# Patient Record
Sex: Female | Born: 1940 | Race: Black or African American | Hispanic: No | Smoking: Current every day smoker
Health system: Southern US, Community
[De-identification: ages and names within clinical notes are randomized; demographics above are authoritative.]

## PROBLEM LIST (undated history)

## (undated) DIAGNOSIS — M1990 Unspecified osteoarthritis, unspecified site: Secondary | ICD-10-CM

## (undated) DIAGNOSIS — R0600 Dyspnea, unspecified: Secondary | ICD-10-CM

## (undated) DIAGNOSIS — I10 Essential (primary) hypertension: Secondary | ICD-10-CM

## (undated) DIAGNOSIS — R112 Nausea with vomiting, unspecified: Secondary | ICD-10-CM

## (undated) DIAGNOSIS — G4730 Sleep apnea, unspecified: Secondary | ICD-10-CM

## (undated) DIAGNOSIS — J449 Chronic obstructive pulmonary disease, unspecified: Secondary | ICD-10-CM

## (undated) DIAGNOSIS — E119 Type 2 diabetes mellitus without complications: Secondary | ICD-10-CM

## (undated) DIAGNOSIS — K219 Gastro-esophageal reflux disease without esophagitis: Secondary | ICD-10-CM

## (undated) DIAGNOSIS — Z972 Presence of dental prosthetic device (complete) (partial): Secondary | ICD-10-CM

## (undated) DIAGNOSIS — M792 Neuralgia and neuritis, unspecified: Secondary | ICD-10-CM

## (undated) DIAGNOSIS — E785 Hyperlipidemia, unspecified: Secondary | ICD-10-CM

## (undated) DIAGNOSIS — Z9889 Other specified postprocedural states: Secondary | ICD-10-CM

## (undated) DIAGNOSIS — H409 Unspecified glaucoma: Secondary | ICD-10-CM

## (undated) DIAGNOSIS — R519 Headache, unspecified: Secondary | ICD-10-CM

## (undated) DIAGNOSIS — J45909 Unspecified asthma, uncomplicated: Secondary | ICD-10-CM

## (undated) DIAGNOSIS — F329 Major depressive disorder, single episode, unspecified: Secondary | ICD-10-CM

## (undated) DIAGNOSIS — F32A Depression, unspecified: Secondary | ICD-10-CM

## (undated) DIAGNOSIS — R51 Headache: Secondary | ICD-10-CM

## (undated) HISTORY — DX: Type 2 diabetes mellitus without complications: E11.9

## (undated) HISTORY — PX: ABDOMINAL HYSTERECTOMY: SHX81

## (undated) HISTORY — DX: Sleep apnea, unspecified: G47.30

## (undated) HISTORY — PX: CATARACT EXTRACTION W/ INTRAOCULAR LENS  IMPLANT, BILATERAL: SHX1307

## (undated) HISTORY — PX: HAND SURGERY: SHX662

## (undated) HISTORY — PX: TONGUE SURGERY: SHX810

## (undated) HISTORY — DX: Hyperlipidemia, unspecified: E78.5

## (undated) HISTORY — DX: Unspecified glaucoma: H40.9

## (undated) HISTORY — DX: Essential (primary) hypertension: I10

## (undated) HISTORY — DX: Major depressive disorder, single episode, unspecified: F32.9

## (undated) HISTORY — DX: Depression, unspecified: F32.A

## (undated) HISTORY — DX: Gastro-esophageal reflux disease without esophagitis: K21.9

## (undated) HISTORY — PX: VOCAL CORD LATERALIZATION, ENDOSCOPIC APPROACH W/ MLB: SHX2664

## (undated) HISTORY — PX: TONSILLECTOMY: SUR1361

## (undated) HISTORY — PX: FOOT SURGERY: SHX648

## (undated) HISTORY — PX: CHOLECYSTECTOMY: SHX55

## (undated) HISTORY — DX: Unspecified osteoarthritis, unspecified site: M19.90

---

## 1980-02-21 DIAGNOSIS — Z9889 Other specified postprocedural states: Secondary | ICD-10-CM

## 1980-02-21 HISTORY — DX: Other specified postprocedural states: Z98.890

## 2003-12-23 DIAGNOSIS — Z0000 Encounter for general adult medical examination without abnormal findings: Secondary | ICD-10-CM | POA: Insufficient documentation

## 2005-11-24 ENCOUNTER — Emergency Department: Payer: Self-pay | Admitting: Emergency Medicine

## 2006-11-13 ENCOUNTER — Emergency Department: Payer: Self-pay | Admitting: Emergency Medicine

## 2006-11-14 ENCOUNTER — Emergency Department: Payer: Self-pay | Admitting: Unknown Physician Specialty

## 2006-11-16 ENCOUNTER — Emergency Department: Payer: Self-pay | Admitting: Emergency Medicine

## 2006-11-18 ENCOUNTER — Emergency Department: Payer: Self-pay | Admitting: Emergency Medicine

## 2006-11-19 ENCOUNTER — Emergency Department: Payer: Self-pay | Admitting: Emergency Medicine

## 2007-07-26 DIAGNOSIS — E1139 Type 2 diabetes mellitus with other diabetic ophthalmic complication: Secondary | ICD-10-CM | POA: Insufficient documentation

## 2007-07-26 DIAGNOSIS — E1165 Type 2 diabetes mellitus with hyperglycemia: Secondary | ICD-10-CM | POA: Insufficient documentation

## 2007-07-26 DIAGNOSIS — E113513 Type 2 diabetes mellitus with proliferative diabetic retinopathy with macular edema, bilateral: Secondary | ICD-10-CM | POA: Insufficient documentation

## 2009-02-20 HISTORY — PX: EYE SURGERY: SHX253

## 2009-08-25 ENCOUNTER — Ambulatory Visit: Payer: Self-pay | Admitting: Cardiovascular Disease

## 2010-01-06 ENCOUNTER — Ambulatory Visit: Payer: Self-pay | Admitting: Rheumatology

## 2010-02-22 ENCOUNTER — Ambulatory Visit: Payer: Self-pay | Admitting: Neurosurgery

## 2010-03-22 DIAGNOSIS — H401190 Primary open-angle glaucoma, unspecified eye, stage unspecified: Secondary | ICD-10-CM | POA: Insufficient documentation

## 2010-03-22 DIAGNOSIS — Z961 Presence of intraocular lens: Secondary | ICD-10-CM | POA: Insufficient documentation

## 2010-03-22 DIAGNOSIS — E113299 Type 2 diabetes mellitus with mild nonproliferative diabetic retinopathy without macular edema, unspecified eye: Secondary | ICD-10-CM | POA: Insufficient documentation

## 2010-03-22 DIAGNOSIS — H2510 Age-related nuclear cataract, unspecified eye: Secondary | ICD-10-CM | POA: Insufficient documentation

## 2010-03-22 DIAGNOSIS — E113513 Type 2 diabetes mellitus with proliferative diabetic retinopathy with macular edema, bilateral: Secondary | ICD-10-CM | POA: Insufficient documentation

## 2010-03-22 HISTORY — DX: Presence of intraocular lens: Z96.1

## 2010-05-04 ENCOUNTER — Emergency Department: Payer: Self-pay | Admitting: Emergency Medicine

## 2010-05-23 DIAGNOSIS — H4089 Other specified glaucoma: Secondary | ICD-10-CM | POA: Insufficient documentation

## 2010-09-05 ENCOUNTER — Encounter: Payer: Self-pay | Admitting: Family Medicine

## 2010-09-21 ENCOUNTER — Encounter: Payer: Self-pay | Admitting: Family Medicine

## 2010-09-28 ENCOUNTER — Ambulatory Visit: Payer: Self-pay | Admitting: Gastroenterology

## 2010-10-03 LAB — PATHOLOGY REPORT

## 2010-10-22 ENCOUNTER — Encounter: Payer: Self-pay | Admitting: Family Medicine

## 2010-11-03 DIAGNOSIS — M545 Low back pain, unspecified: Secondary | ICD-10-CM | POA: Insufficient documentation

## 2010-11-09 DIAGNOSIS — M7550 Bursitis of unspecified shoulder: Secondary | ICD-10-CM | POA: Insufficient documentation

## 2010-11-09 DIAGNOSIS — M545 Low back pain, unspecified: Secondary | ICD-10-CM | POA: Insufficient documentation

## 2010-11-21 ENCOUNTER — Encounter: Payer: Self-pay | Admitting: Family Medicine

## 2010-12-22 ENCOUNTER — Encounter: Payer: Self-pay | Admitting: Family Medicine

## 2011-01-21 ENCOUNTER — Encounter: Payer: Self-pay | Admitting: Family Medicine

## 2011-03-31 DIAGNOSIS — H348192 Central retinal vein occlusion, unspecified eye, stable: Secondary | ICD-10-CM | POA: Insufficient documentation

## 2011-03-31 DIAGNOSIS — H04129 Dry eye syndrome of unspecified lacrimal gland: Secondary | ICD-10-CM | POA: Insufficient documentation

## 2011-04-08 ENCOUNTER — Observation Stay: Payer: Self-pay | Admitting: Internal Medicine

## 2011-04-08 LAB — COMPREHENSIVE METABOLIC PANEL
Albumin: 3.9 g/dL (ref 3.4–5.0)
Alkaline Phosphatase: 99 U/L (ref 50–136)
Anion Gap: 6 — ABNORMAL LOW (ref 7–16)
Calcium, Total: 9 mg/dL (ref 8.5–10.1)
Chloride: 106 mmol/L (ref 98–107)
Co2: 28 mmol/L (ref 21–32)
Creatinine: 1.05 mg/dL (ref 0.60–1.30)
EGFR (African American): >60
EGFR (Non-African Amer.): 55 — ABNORMAL LOW
Osmolality: 282 (ref 275–301)
Potassium: 4.5 mmol/L (ref 3.5–5.1)
SGOT(AST): 18 U/L (ref 15–37)
SGPT (ALT): 23 U/L
Sodium: 140 mmol/L (ref 136–145)
Total Protein: 7.2 g/dL (ref 6.4–8.2)

## 2011-04-08 LAB — CBC WITH DIFFERENTIAL/PLATELET
Eosinophil #: 0.2 10*3/uL (ref 0.0–0.7)
Eosinophil %: 3.8 %
MCH: 29 pg (ref 26.0–34.0)
Monocyte #: 0.2 10*3/uL (ref 0.0–0.7)
Neutrophil %: 69.1 %
Platelet: 300 10*3/uL (ref 150–440)
RBC: 4.32 10*6/uL (ref 3.80–5.20)
WBC: 6.2 10*3/uL (ref 3.6–11.0)

## 2011-04-08 LAB — TROPONIN I: Troponin-I: <0.02 ng/mL

## 2011-04-08 LAB — TSH: Thyroid Stimulating Horm: 1.2 u[IU]/mL

## 2011-04-09 LAB — CBC WITH DIFFERENTIAL/PLATELET
Basophil %: 0.5 %
Eosinophil #: 0.2 10*3/uL (ref 0.0–0.7)
Eosinophil %: 3.6 %
Lymphocyte #: 1.7 10*3/uL (ref 1.0–3.6)
MCH: 29 pg (ref 26.0–34.0)
MCHC: 33.1 g/dL (ref 32.0–36.0)
MCV: 88 fL (ref 80–100)
Monocyte #: 0.3 10*3/uL (ref 0.0–0.7)
Monocyte %: 4.9 %
Neutrophil %: 60.3 %
WBC: 5.7 10*3/uL (ref 3.6–11.0)

## 2011-04-09 LAB — LIPID PANEL
HDL Cholesterol: 27 mg/dL — ABNORMAL LOW (ref 40–60)
Ldl Cholesterol, Calc: 61 mg/dL (ref 0–100)
Triglycerides: 80 mg/dL (ref 0–200)
VLDL Cholesterol, Calc: 16 mg/dL (ref 5–40)

## 2011-04-09 LAB — BASIC METABOLIC PANEL
Anion Gap: 9 (ref 7–16)
BUN: 16 mg/dL (ref 7–18)
Calcium, Total: 8.2 mg/dL — ABNORMAL LOW (ref 8.5–10.1)
Chloride: 106 mmol/L (ref 98–107)
Co2: 26 mmol/L (ref 21–32)
EGFR (African American): >60
EGFR (Non-African Amer.): >60
Osmolality: 289 (ref 275–301)

## 2011-04-20 DIAGNOSIS — K5900 Constipation, unspecified: Secondary | ICD-10-CM | POA: Insufficient documentation

## 2011-04-20 HISTORY — DX: Constipation, unspecified: K59.00

## 2011-07-25 DIAGNOSIS — R519 Headache, unspecified: Secondary | ICD-10-CM

## 2011-07-25 HISTORY — DX: Headache, unspecified: R51.9

## 2011-09-18 ENCOUNTER — Encounter: Payer: Self-pay | Admitting: Rheumatology

## 2011-09-21 ENCOUNTER — Encounter: Payer: Self-pay | Admitting: Rheumatology

## 2012-07-08 ENCOUNTER — Ambulatory Visit: Payer: Self-pay | Admitting: Anesthesiology

## 2012-07-09 ENCOUNTER — Ambulatory Visit: Payer: Self-pay | Admitting: Ophthalmology

## 2013-03-11 ENCOUNTER — Ambulatory Visit: Payer: Self-pay

## 2013-10-30 DIAGNOSIS — R059 Cough, unspecified: Secondary | ICD-10-CM | POA: Insufficient documentation

## 2013-10-30 DIAGNOSIS — I214 Non-ST elevation (NSTEMI) myocardial infarction: Secondary | ICD-10-CM | POA: Insufficient documentation

## 2013-10-30 DIAGNOSIS — R079 Chest pain, unspecified: Secondary | ICD-10-CM

## 2013-10-30 HISTORY — DX: Cough, unspecified: R05.9

## 2013-10-30 HISTORY — DX: Chest pain, unspecified: R07.9

## 2014-04-07 ENCOUNTER — Ambulatory Visit: Payer: Self-pay | Admitting: Internal Medicine

## 2014-06-14 NOTE — H&P (Signed)
PATIENT NAME:  Wanda Gardner, Wanda Gardner MR#:  [id number] DATE OF BIRTH:  May 25, 1940  DATE OF ADMISSION:  04/08/2011  PRIMARY CARE PHYSICIAN: [facility], Dr. Jabier Mutton   CHIEF COMPLAINT: Not feeling well.   HISTORY OF PRESENT ILLNESS: This is a 74 year old female was sitting at the table talking with her friend. She developed a diffuse sweat. She felt very weak. She could hardly stand or walk. She never had this feeling before. She felt very nauseous but could not vomit. She had an empty-type feeling. When EMS arrived she started feeling a little bit better. When EMS arrived they put her into the ambulance on the monitor showed sinus bradycardia at 68 beats per minute. In route they stated that the patient became unresponsive, that he heart rate dropped to 48. They gave her a bolus and a mg of atropine. The patient does not recall passing out in the ambulance and currently feels fine at this point.   PAST MEDICAL HISTORY:  1. Diabetes. 2. Hypertension. 3. Muscle and joint aches. 4. Sleep apnea. 5. Glaucoma. 6. Irritable bowel syndrome.   PAST SURGICAL HISTORY:  7. Tonsillectomy.  8. Tongue biopsy. 9. Foot operation.  10. Hysterectomy in two parts.  11. Bilateral carpal tunnel. 12. Right eye surgery. 13. Gallbladder. 14. Probably the appendix out also.   ALLERGIES: Tylenol with Codeine.   MEDICATIONS:  1. Albuterol p.r.n.  2. Aspirin 81 mg daily.  3. Cozaar 50 mg daily.  4. Hydrochlorothiazide 25 mg daily.  5. Lantus 15 units subcutaneous injection nightly.  6. Lumigan 0.03% ophthalmic solution one drop affected eye at bedtime.  7. Lyrica 200 mg 3 times a day. 8. Metformin 1000 mg twice a day.  9. Naprosyn 500 mg twice a day. 10. Nexium 40 mg daily.  11. Norco 5/325, 1 tablet every six hours p.r.n.  12. Sertraline 50 mg daily.  13. Tramadol 50 mg every four hours p.r.n.  14. NovoLog 10 units prior to meals.   SOCIAL HISTORY: Smokes 1 pack per day for 50 years. Occasional beer.  Occasional marijuana. Used to work in the Personal [employer] in the past. Has not worked since her 74s.   FAMILY HISTORY: Father died in his 62s of cancer, unknown type. Mother died at 61 of congestive heart failure.    REVIEW OF SYSTEMS: CONSTITUTIONAL: Positive for sweating. Positive for chills. Positive for weakness. Positive for gaining weight. EYES: Does wear glasses. Has glaucoma. EARS, NOSE, MOUTH, AND THROAT: Decreased hearing in the right ear. Dysphagia occasional to her own saliva. No problem swallowing food or liquids or pills. CARDIOVASCULAR: Occasional chest pains, last a few weeks ago, seconds at a time, can happen at any time, happened at rest. States that she had a stress test that was negative a couple of years ago. RESPIRATORY: Positive for shortness of breath occasionally. No sputum. No hemoptysis. GASTROINTESTINAL: Positive for nausea. No vomiting. Positive for abdominal pain all the time. Occasional diarrhea and constipation. No bright red blood per rectum. No melena. GENITOURINARY: No burning on urination. No hematuria. MUSCULOSKELETAL: Positive for joint pains and muscle pain. INTEGUMENT: No rashes or eruptions. NEUROLOGIC: Near syncope today. PSYCHIATRIC: On medication for depression. ENDOCRINE: No thyroid problems. HEMATOLOGIC/LYMPHATIC: No anemia, no easy bruising or bleeding.    PHYSICAL EXAMINATION:  VITAL SIGNS: Temperature 96, pulse 70, respirations 20, blood pressure 140/75, pulse oximetry 98%.   GENERAL: No respiratory distress. Obese female lying in bed.   EYES: Conjunctivae and lids normal. Pupils equal, round, and  reactive to light. Extraocular muscles intact. No nystagmus.   EARS, NOSE, MOUTH, AND THROAT: Tympanic membranes no erythema. Nasal mucosa no erythema. Throat no erythema. No exudate seen.   NECK: No JVD. No bruits. No lymphadenopathy. No thyromegaly. No thyroid nodules palpated.   RESPIRATORY: Lungs clear to auscultation. No use of accessory  muscles to breathe. No rhonchi, rales, or wheeze heard.   CARDIOVASCULAR: S1, S2 normal. No gallops, rubs, or murmurs heard. Carotid upstroke 2+ bilaterally. No bruits.   EXTREMITIES: Dorsalis pedis pulses 2+ bilaterally. Trace edema lower extremity.   ABDOMEN: Soft, slight tenderness, more in the left upper quadrant. No organomegaly/splenomegaly. Normoactive bowel sounds. No masses felt.   LYMPHATIC: No lymph nodes in the neck.   MUSCULOSKELETAL: No clubbing. Trace edema. No cyanosis.   SKIN: No ulcers or lesions seen.   NEUROLOGIC: Cranial nerves II through XII grossly intact. Deep tendon reflexes 1+ bilateral lower extremity. Power 5/5 upper and lower extremities.   PSYCHIATRIC: Patient is oriented to person, place, and time.   LABORATORY, DIAGNOSTIC, AND RADIOLOGICAL DATA: Chest x-ray showed no acute cardiopulmonary disease. White blood cell count 6.2, hemoglobin and hematocrit 12.5 and 37.6, platelet count 300, glucose 109, BUN 18, creatinine 1.05, sodium 140, potassium 4.5, chloride 106, CO2 28, calcium 9.0. Liver function tests normal. Troponin negative. TSH 1.2. EKG normal sinus rhythm, 69 beats per minute, left atrial enlargement, low voltage.   ASSESSMENT AND PLAN:  1. Syncope with bradycardia. Unclear if this bradycardia is the cause of this near syncopal episode, sounds more like a vasovagal episode to me. Patient is not on any rate controlling medications. Will watch on telemetry at this point. Will give IV fluid hydration. Check orthostatic vital signs. Check an echo and a carotid ultrasound. Watch on telemetry. Check serial cardiac enzymes to rule out myocardial infarction. Hopefully patient will be able to be discharged tomorrow. Will admit to the observation unit.  2. Diabetes. Continue Lantus and metformin.  3. Hypertension. Blood pressure slightly elevated right now. Will continue Cozaar and hydrochlorothiazide.  4. Gastroesophageal reflux disease. Continue Nexium.   5. Sleep apnea. Will give CPAP at night.  6. Obesity. Weight loss recommended.  7. Tobacco abuse. Smoking cessation counseling done by me, three minutes. Patient refused nicotine patch.  8. Chronic abdominal pain. Patient was told that she has irritable bowel syndrome. Will hold off on imaging at this time. Follow up as outpatient.  9. Chest pain lasting seconds at a time. Patient had a negative stress test a couple of years ago. Will hold off on repeating at this point since it seems noncardiac to me. Will check cardiac enzymes.    TIME SPENT ON ADMISSION: 55 minutes. Patient will be admitted as an observation.   ____________________________ Tana Conch. Leslye Peer, MD rjw:cms D: 04/08/2011 21:23:15 ET T: 04/09/2011 08:05:46 ET JOB#: [id number]  cc: Tana Conch. Leslye Peer, MD, <Dictator> [facility], Dr. Judene Companion MD ELECTRONICALLY SIGNED 04/09/2011 20:57

## 2014-06-14 NOTE — Discharge Summary (Signed)
PATIENT NAME:  Wanda Gardner, Wanda Gardner MR#:  [id number] DATE OF BIRTH:  07/19/1940  DATE OF ADMISSION:  04/08/2011 DATE OF DISCHARGE:  04/09/2011  ADMITTING PHYSICIAN: Loletha Grayer, MD    DISCHARGING PHYSICIAN:  Gladstone Lighter, MD   PRIMARY CARE PHYSICIAN: [place] IN THE HOSPITAL:  None.   DISCHARGE DIAGNOSES:  1. Near syncopal episode, likely vasovagal.  2. Insulin-dependent diabetes mellitus.  3. Hypertension.  4. Chronic sinus bradycardia. 5. Sleep apnea. 6. Gastroesophageal reflux disease.  7. Irritable bowel syndrome.  8. Degenerative disk disease.  9. Osteoarthritis.   DISCHARGE MEDICATIONS:  1. Lantus 15 units subcutaneous at bedtime.  2. Nexium 40 mg p.o. daily.  3. Zoloft 50 mg p.o. daily.  4. Naproxen 500 mg p.o. b.i.d.  5. Tramadol 50 mg p.o. every 6 hours p.r.n.  6. HCTZ 25 mg p.o. daily.  7. Aspirin 81 mg p.o. daily.  8. Albuterol inhaler as needed.  9. Norco 5/325 mg, 1 tablet every 6 hours p.r.n.  10. Metformin 1000 mg p.o. b.i.d.  11. Lyrica 200 mg p.o. t.i.d.  12. Cozaar 50 mg p.o. daily.  13. Lumigan eyedrops 0.03% ophthalmic solution, 1 drop each eye at nighttime.   DISCHARGE DIET: Low sodium ADA diet.   DISCHARGE ACTIVITY: As tolerated.    FOLLOWUP INSTRUCTIONS:  1. Primary care physician followup in 1 to 2 weeks.  2. The patient was advised to continue all her home medications as no changes have been made in the hospital.   [place], DIAGNOSTIC AND RADIOLOGICAL DATA:  Labs at the time of discharge: WBC 5.7, hemoglobin 11.3, hematocrit 33.8, platelet count 267. Sodium 141, potassium 4.1, chloride 106, bicarbonate 26, BUN 16, creatinine 0.96, glucose of 210, calcium of 8.2.  LDL 61, HDL 27, total cholesterol 104, triglycerides of 80. Troponins have remained negative.  Chest x-ray on admission showing no acute cardiopulmonary disease.  Ultrasound of carotids bilaterally showing minimal atherosclerotic disease  without evidence of significant stenosis. TSH was 1.2.  Echo Doppler showing normal LV size and function with mild-to-moderate mitral regurgitation. Ejection fraction is greater than 55%.   BRIEF HOSPITAL COURSE: Wanda Gardner is a 74 year old female with past medical history of diabetes, hypertension, sleep apnea, and also she had a prior history of chronic dizziness and was in vestibular rehab in the past, presents to the hospital complaining of an episode where she was sitting with her friend and developed a sudden onset of dizziness with diffuse sweating. En route to the hospital, she was found to have a heart rate of 48 but asymptomatic at the time.   1. Near syncopal episode with diaphoresis, lightheadedness and dizziness: Could be vasovagal versus one of her chronic dizziness episodes coming back. She has remained sinus brady in the hospital with baseline heart rate mostly in the 50s that dropped down to 40s when she was sleeping, but she has been asymptomatic all the while; so we doubt that bradycardia was contributing to her symptoms. Probably she is chronically bradycardic, and that is why she is not on any beta blocker in her home medication list. This could have been just like a vasovagal episode. She did work well with Physical Therapy, who said that she will not need any home PT also. She has not been symptomatic while in the hospital. Her tests, including echo Dopplers, carotid Dopplers, have been negative; so she is being discharged home.  2. Hypertension: She is on  HCTZ and Cozaar, which she will continue at  home.  3. Insulin-dependent diabetes mellitus: She is on metformin and also Lantus. She takes Lyrica for her peripheral neuropathy symptoms.  4. Degenerative disk disease and osteoarthritis: She is on naproxen, Norco and tramadol p.r.n. for pain. She follows up with Pain Management. She follows up with Dr. Dossie Der, Rheumatology, for her arthritis. 5. Irritable bowel syndrome: She follows  up with Dr. Dionne Milo for the same.   Her course has been otherwise uneventful in the hospital.   DISCHARGE CONDITION: Stable.   DISCHARGE DISPOSITION: Home.   TIME SPENT ON DISCHARGE: 40 minutes.  ____________________________ Gladstone Lighter, MD rk:cbb D: 04/10/2011 14:34:33 ET T: 04/11/2011 10:06:32 ET JOB#: [id number]  cc: Gladstone Lighter, MD, <Dictator> [place]. Loma Newton, MD Jill Side, MD Dawayne Patricia. Dossie Der, MD Gladstone Lighter MD ELECTRONICALLY SIGNED 04/12/2011 11:55

## 2014-07-15 DIAGNOSIS — I10 Essential (primary) hypertension: Secondary | ICD-10-CM | POA: Diagnosis not present

## 2014-07-15 DIAGNOSIS — R0789 Other chest pain: Secondary | ICD-10-CM | POA: Insufficient documentation

## 2014-07-15 DIAGNOSIS — E119 Type 2 diabetes mellitus without complications: Secondary | ICD-10-CM | POA: Insufficient documentation

## 2014-07-15 DIAGNOSIS — J40 Bronchitis, not specified as acute or chronic: Secondary | ICD-10-CM | POA: Insufficient documentation

## 2014-07-16 ENCOUNTER — Emergency Department: Payer: Medicare Other

## 2014-07-16 ENCOUNTER — Emergency Department
Admission: EM | Admit: 2014-07-16 | Discharge: 2014-07-16 | Disposition: A | Discharge status: Home / Self Care | Payer: Medicare Other | Attending: Emergency Medicine | Admitting: Emergency Medicine

## 2014-07-16 ENCOUNTER — Other Ambulatory Visit: Payer: Self-pay

## 2014-07-16 DIAGNOSIS — R0789 Other chest pain: Secondary | ICD-10-CM | POA: Diagnosis not present

## 2014-07-16 DIAGNOSIS — J40 Bronchitis, not specified as acute or chronic: Secondary | ICD-10-CM

## 2014-07-16 LAB — CBC
HCT: 35.4 % (ref 35.0–47.0)
HEMOGLOBIN: 11.8 g/dL — AB (ref 12.0–16.0)
MCH: 26.9 pg (ref 26.0–34.0)
MCHC: 33.2 g/dL (ref 32.0–36.0)
MCV: 80.9 fL (ref 80.0–100.0)
Platelets: 292 10*3/uL (ref 150–440)
RBC: 4.38 MIL/uL (ref 3.80–5.20)
RDW: 15.7 % — ABNORMAL HIGH (ref 11.5–14.5)
WBC: 6.1 10*3/uL (ref 3.6–11.0)

## 2014-07-16 LAB — BASIC METABOLIC PANEL
Anion gap: 7 (ref 5–15)
BUN: 22 mg/dL — AB (ref 6–20)
CHLORIDE: 102 mmol/L (ref 101–111)
CO2: 30 mmol/L (ref 22–32)
CREATININE: 1.22 mg/dL — AB (ref 0.44–1.00)
Calcium: 9.3 mg/dL (ref 8.9–10.3)
GFR calc Af Amer: 50 mL/min — ABNORMAL LOW (ref >60)
GFR, EST NON AFRICAN AMERICAN: 43 mL/min — AB (ref >60)
Glucose, Bld: 203 mg/dL — ABNORMAL HIGH (ref 65–99)
Potassium: 3.8 mmol/L (ref 3.5–5.1)
SODIUM: 139 mmol/L (ref 135–145)

## 2014-07-16 LAB — TROPONIN I

## 2014-07-16 MED ORDER — PREDNISONE 20 MG PO TABS
ORAL_TABLET | ORAL | Status: AC
  Administered 2014-07-16: 40 mg via ORAL
  Filled 2014-07-16: qty 2

## 2014-07-16 MED ORDER — PROMETHAZINE-DM 6.25-15 MG/5ML PO SYRP: 5.0000 mL | ORAL_SOLUTION | Freq: Four times a day (QID) | ORAL | Status: DC | PRN

## 2014-07-16 MED ORDER — PREDNISONE 20 MG PO TABS
40.0000 mg | ORAL_TABLET | Freq: Once | ORAL | Status: AC
  Administered 2014-07-16: 40 mg via ORAL

## 2014-07-16 MED ORDER — OXYCODONE-ACETAMINOPHEN 5-325 MG PO TABS
1.0000 | ORAL_TABLET | Freq: Once | ORAL | Status: AC
  Administered 2014-07-16: 1 via ORAL

## 2014-07-16 MED ORDER — PREDNISONE 20 MG PO TABS: 40.0000 mg | ORAL_TABLET | Freq: Every day | ORAL | Status: DC

## 2014-07-16 MED ORDER — OXYCODONE-ACETAMINOPHEN 5-325 MG PO TABS
ORAL_TABLET | ORAL | Status: AC
  Administered 2014-07-16: 1 via ORAL
  Filled 2014-07-16: qty 1

## 2014-07-16 MED ORDER — OXYCODONE-ACETAMINOPHEN 5-325 MG PO TABS: 1.0000 | ORAL_TABLET | ORAL | Status: DC | PRN

## 2014-07-16 NOTE — ED Notes (Signed)
Pt to triage via w/c with no distress noted; pt reports left sided CP x 2weeks, prod cough clear sputum; denies hx of same

## 2014-07-16 NOTE — ED Provider Notes (Signed)
[facility] Emergency Department Provider Note  ____________________________________________  Time seen: Approximately 3:14 AM  I have reviewed the triage vital signs and the nursing notes.   HISTORY  Chief Complaint Chest Pain    HPI Wanda Gardner is a 74 y.o. female who presents with a two-week history of chest pain and cough. Patient has had a nonproductive cough associated with chest soreness. Presents tonight due to running out of her cough medicine and unable to sleep secondary to chest discomfort. Last week patient had a cardiac workup by Dr. Chancy Milroy; stress test negative, echo results pending.Patient denies fever, chills, abdominal pain, vomiting, diarrhea, headache, weakness. Patient endorses nausea associated with coughing spasms. Patient has a Pro-air inhaler at home which she uses for cough/wheeze. Patient denies past history of CAD. Denies recent travel or surgery or hormone use.   Past medical history Diabetes Hypertension    No past surgical history on file.  Current Outpatient Rx  Name  Route  Sig  Dispense  Refill  . oxyCODONE-acetaminophen (PERCOCET/ROXICET) 5-325 MG per tablet   Oral   Take 1 tablet by mouth every 4 (four) hours as needed for severe pain.   20 tablet   0   . predniSONE (DELTASONE) 20 MG tablet   Oral   Take 2 tablets (40 mg total) by mouth daily with breakfast.   8 tablet   0   . promethazine-dextromethorphan (PROMETHAZINE-DM) 6.25-15 MG/5ML syrup   Oral   Take 5 mLs by mouth 4 (four) times daily as needed for cough.   120 mL   0     Allergies Review of patient's allergies indicates no known allergies.  Family history Congestive heart failure  Social History History  Substance Use Topics  . Smoking status: Not on file  . Smokeless tobacco: Not on file  . Alcohol Use: Not on file  + Smoker  Review of Systems Constitutional: No fever/chills Eyes: No visual changes. ENT: No sore  throat. Cardiovascular: Positive for chest pain. Respiratory: Positive for cough. Denies shortness of breath. Gastrointestinal: No abdominal pain.  No nausea, no vomiting.  No diarrhea.  No constipation. Genitourinary: Negative for dysuria. Musculoskeletal: Negative for back pain. Skin: Negative for rash. Neurological: Negative for headaches, focal weakness or numbness.  10-point ROS otherwise negative.  ____________________________________________   PHYSICAL EXAM:  VITAL SIGNS: ED Triage Vitals  Enc Vitals Group     BP 07/16/14 0022 157/63 mmHg     Pulse Rate 07/16/14 0022 68     Resp 07/16/14 0242 20     Temp 07/16/14 0022 98 F (36.7 C)     Temp Source 07/16/14 0022 Oral     SpO2 07/16/14 0022 100 %     Weight 07/16/14 0022 223 lb (101.152 kg)     Height 07/16/14 0022 5\' 7"  (1.702 m)     Head Cir --      Peak Flow --      Pain Score 07/16/14 0022 6     Pain Loc --      Pain Edu? --      Excl. in [place]? --     Constitutional: Alert and oriented. Well appearing and in no acute distress. Eyes: Conjunctivae are normal. PERRL. EOMI. Head: Atraumatic. Nose: No congestion/rhinnorhea. Mouth/Throat: Mucous membranes are moist.  Oropharynx non-erythematous. Neck: No stridor.   Cardiovascular: Normal rate, regular rhythm. Grossly normal heart sounds.  Good peripheral circulation. Anterior chest wall tender to palpation. Chest tender on movement and raising  arms. Respiratory: Normal respiratory effort.  No retractions. Lungs CTAB with occasional scattered expiratory wheeze. Gastrointestinal: Soft and nontender. No distention. No abdominal bruits. No CVA tenderness. Musculoskeletal: No lower extremity tenderness nor edema.  No joint effusions. Neurologic:  Normal speech and language. No gross focal neurologic deficits are appreciated. Speech is normal. No gait instability. Skin:  Skin is warm, dry and intact. No rash noted. Psychiatric: Mood and affect are normal. Speech and  behavior are normal.  ____________________________________________   LABS (all labs ordered are listed, but only abnormal results are displayed)  Labs Reviewed  CBC - Abnormal; Notable for the following:    Hemoglobin 11.8 (*)    RDW 15.7 (*)    All other components within normal limits  BASIC METABOLIC PANEL - Abnormal; Notable for the following:    Glucose, Bld 203 (*)    BUN 22 (*)    Creatinine, Ser 1.22 (*)    GFR calc non Af Amer 43 (*)    GFR calc Af Amer 50 (*)    All other components within normal limits  TROPONIN I   ____________________________________________  EKG  ED ECG REPORT I, SUNG,JADE J, the attending physician, personally viewed and interpreted this ECG.   Date: 07/16/2014  EKG Time: 0034  Rate: 68  Rhythm: normal EKG, normal sinus rhythm  Axis: Normal  Intervals:none  ST&T Change: Nonspecific  ____________________________________________  RADIOLOGY  Chest 2 view (viewed by me, interpreted per Dr. Pascal Lux): Hyperexpanded lungs and bronchitic change without acute cardiopulmonary disease. ____________________________________________   PROCEDURES  Procedure(s) performed: None  Critical Care performed: No  ____________________________________________   INITIAL IMPRESSION / ASSESSMENT AND PLAN / ED COURSE  Pertinent labs & imaging results that were available during my care of the patient were reviewed by me and considered in my medical decision making (see chart for details).  74 year old female with a two-week history of nonproductive cough and chest pain. Normal EKG with negative troponin; low suspicion for acute coronary syndrome. No recent travel/surgeries/hormone use; low suspicion for PE. Suspect acute costochondritis. Discussed with patient will start low-dose steroid which will likely temporarily increase her blood sugars. Patient complained of high cost of Tussionex. Will prescribe antitussive on the [employer] $4 list as well as Percocet  for analgesia. Avoid NSAID secondary to renal insufficiency. Discussed with patient and family and given strict return precautions. Both verbalize understanding and agree with plan of care. ____________________________________________   FINAL CLINICAL IMPRESSION(S) / ED DIAGNOSES  Final diagnoses:  Chest wall pain  Bronchitis      Paulette Blanch, MD 07/16/14 [phone]

## 2014-07-16 NOTE — Discharge Instructions (Signed)
1. Take steroid daily as prescribed (prednisone 40 mg 4 days). 2. Take pain medicine as needed (Percocet #20). 3. Take cough medicine as needed (promethazine DM syrup). 4. Return to the ER for worsening symptoms, persistent vomiting, difficulty breathing or other concerns.  prescrib Chest Pain (Nonspecific) It is often hard to give a specific diagnosis for the cause of chest pain. There is always a chance that your pain could be related to something serious, such as a heart attack or a blood clot in the lungs. You need to follow up with your health care provider for further evaluation. CAUSES   Heartburn.  Pneumonia or bronchitis.  Anxiety or stress.  Inflammation around your heart (pericarditis) or lung (pleuritis or pleurisy).  A blood clot in the lung.  A collapsed lung (pneumothorax). It can develop suddenly on its own (spontaneous pneumothorax) or from trauma to the chest.  Shingles infection (herpes zoster virus). The chest wall is composed of bones, muscles, and cartilage. Any of these can be the source of the pain.  The bones can be bruised by injury.  The muscles or cartilage can be strained by coughing or overwork.  The cartilage can be affected by inflammation and become sore (costochondritis). DIAGNOSIS  Lab tests or other studies may be needed to find the cause of your pain. Your health care provider may have you take a test called an ambulatory electrocardiogram (ECG). An ECG records your heartbeat patterns over a 24-hour period. You may also have other tests, such as:  Transthoracic echocardiogram (TTE). During echocardiography, sound waves are used to evaluate how blood flows through your heart.  Transesophageal echocardiogram (TEE).  Cardiac monitoring. This allows your health care provider to monitor your heart rate and rhythm in real time.  Holter monitor. This is a portable device that records your heartbeat and can help diagnose heart arrhythmias. It allows  your health care provider to track your heart activity for several days, if needed.  Stress tests by exercise or by giving medicine that makes the heart beat faster. TREATMENT   Treatment depends on what may be causing your chest pain. Treatment may include:  Acid blockers for heartburn.  Anti-inflammatory medicine.  Pain medicine for inflammatory conditions.  Antibiotics if an infection is present.  You may be advised to change lifestyle habits. This includes stopping smoking and avoiding alcohol, caffeine, and chocolate.  You may be advised to keep your head raised (elevated) when sleeping. This reduces the chance of acid going backward from your stomach into your esophagus. Most of the time, nonspecific chest pain will improve within 2-3 days with rest and mild pain medicine.  HOME CARE INSTRUCTIONS   If antibiotics were prescribed, take them as directed. Finish them even if you start to feel better.  For the next few days, avoid physical activities that bring on chest pain. Continue physical activities as directed.  Do not use any tobacco products, including cigarettes, chewing tobacco, or electronic cigarettes.  Avoid drinking alcohol.  Only take medicine as directed by your health care provider.  Follow your health care provider's suggestions for further testing if your chest pain does not go away.  Keep any follow-up appointments you made. If you do not go to an appointment, you could develop lasting (chronic) problems with pain. If there is any problem keeping an appointment, call to reschedule. SEEK MEDICAL CARE IF:   Your chest pain does not go away, even after treatment.  You have a rash with blisters on your  chest.  You have a fever. SEEK IMMEDIATE MEDICAL CARE IF:   You have increased chest pain or pain that spreads to your arm, neck, jaw, back, or abdomen.  You have shortness of breath.  You have an increasing cough, or you cough up blood.  You have  severe back or abdominal pain.  You feel nauseous or vomit.  You have severe weakness.  You faint.  You have chills. This is an emergency. Do not wait to see if the pain will go away. Get medical help at once. Call your local emergency services (911 in [place] Do not drive yourself to the hospital. MAKE SURE YOU:   Understand these instructions.  Will watch your condition.  Will get help right away if you are not doing well or get worse. Document Released: 11/16/2004 Document Revised: 02/11/2013 Document Reviewed: 09/12/2007 [facility] Patient Information 2015 [place], [place]. This information is not intended to replace advice given to you by your health care provider. Make sure you discuss any questions you have with your health care provider.  Chest Wall Pain Chest wall pain is pain in or around the bones and muscles of your chest. It may take up to 6 weeks to get better. It may take longer if you must stay physically active in your work and activities.  CAUSES  Chest wall pain may happen on its own. However, it may be caused by:  A viral illness like the flu.  Injury.  Coughing.  Exercise.  Arthritis.  Fibromyalgia.  Shingles. HOME CARE INSTRUCTIONS   Avoid overtiring physical activity. Try not to strain or perform activities that cause pain. This includes any activities using your chest or your abdominal and side muscles, especially if heavy weights are used.  Put ice on the sore area.  Put ice in a plastic bag.  Place a towel between your skin and the bag.  Leave the ice on for 15-20 minutes per hour while awake for the first 2 days.  Only take over-the-counter or prescription medicines for pain, discomfort, or fever as directed by your caregiver. SEEK IMMEDIATE MEDICAL CARE IF:   Your pain increases, or you are very uncomfortable.  You have a fever.  Your chest pain becomes worse.  You have new, unexplained symptoms.  You have nausea or vomiting.  You  feel sweaty or lightheaded.  You have a cough with phlegm (sputum), or you cough up blood. MAKE SURE YOU:   Understand these instructions.  Will watch your condition.  Will get help right away if you are not doing well or get worse. Document Released: 02/06/2005 Document Revised: 05/01/2011 Document Reviewed: 10/03/2010 [facility] Patient Information 2015 [place], [place]. This information is not intended to replace advice given to you by your health care provider. Make sure you discuss any questions you have with your health care provider.  Costochondritis Costochondritis, sometimes called Tietze syndrome, is a swelling and irritation (inflammation) of the tissue (cartilage) that connects your ribs with your breastbone (sternum). It causes pain in the chest and rib area. Costochondritis usually goes away on its own over time. It can take up to 6 weeks or longer to get better, especially if you are unable to limit your activities. CAUSES  Some cases of costochondritis have no known cause. Possible causes include:  Injury (trauma).  Exercise or activity such as lifting.  Severe coughing. SIGNS AND SYMPTOMS  Pain and tenderness in the chest and rib area.  Pain that gets worse when coughing or taking deep  breaths.  Pain that gets worse with specific movements. DIAGNOSIS  Your health care provider will do a physical exam and ask about your symptoms. Chest X-rays or other tests may be done to rule out other problems. TREATMENT  Costochondritis usually goes away on its own over time. Your health care provider may prescribe medicine to help relieve pain. HOME CARE INSTRUCTIONS   Avoid exhausting physical activity. Try not to strain your ribs during normal activity. This would include any activities using chest, abdominal, and side muscles, especially if heavy weights are used.  Apply ice to the affected area for the first 2 days after the pain begins.  Put ice in a plastic bag.  Place  a towel between your skin and the bag.  Leave the ice on for 20 minutes, 2-3 times a day.  Only take over-the-counter or prescription medicines as directed by your health care provider. SEEK MEDICAL CARE IF:  You have redness or swelling at the rib joints. These are signs of infection.  Your pain does not go away despite rest or medicine. SEEK IMMEDIATE MEDICAL CARE IF:   Your pain increases or you are very uncomfortable.  You have shortness of breath or difficulty breathing.  You cough up blood.  You have worse chest pains, sweating, or vomiting.  You have a fever or persistent symptoms for more than 2-3 days.  You have a fever and your symptoms suddenly get worse. MAKE SURE YOU:   Understand these instructions.  Will watch your condition.  Will get help right away if you are not doing well or get worse. Document Released: 11/16/2004 Document Revised: 11/27/2012 Document Reviewed: 09/10/2012 [facility] Patient Information 2015 [place], [place]. This information is not intended to replace advice given to you by your health care provider. Make sure you discuss any questions you have with your health care provider.  Cough, Adult  A cough is a reflex that helps clear your throat and airways. It can help heal the body or may be a reaction to an irritated airway. A cough may only last 2 or 3 weeks (acute) or may last more than 8 weeks (chronic).  CAUSES Acute cough:  Viral or bacterial infections. Chronic cough:  Infections.  Allergies.  Asthma.  Post-nasal drip.  Smoking.  Heartburn or acid reflux.  Some medicines.  Chronic lung problems (COPD).  Cancer. SYMPTOMS   Cough.  Fever.  Chest pain.  Increased breathing rate.  High-pitched whistling sound when breathing (wheezing).  Colored mucus that you cough up (sputum). TREATMENT   A bacterial cough may be treated with antibiotic medicine.  A viral cough must run its course and will not respond to  antibiotics.  Your caregiver may recommend other treatments if you have a chronic cough. HOME CARE INSTRUCTIONS   Only take over-the-counter or prescription medicines for pain, discomfort, or fever as directed by your caregiver. Use cough suppressants only as directed by your caregiver.  Use a cold steam vaporizer or humidifier in your bedroom or home to help loosen secretions.  Sleep in a semi-upright position if your cough is worse at night.  Rest as needed.  Stop smoking if you smoke. SEEK IMMEDIATE MEDICAL CARE IF:   You have pus in your sputum.  Your cough starts to worsen.  You cannot control your cough with suppressants and are losing sleep.  You begin coughing up blood.  You have difficulty breathing.  You develop pain which is getting worse or is uncontrolled with medicine.  You  have a fever. MAKE SURE YOU:   Understand these instructions.  Will watch your condition.  Will get help right away if you are not doing well or get worse. Document Released: 08/05/2010 Document Revised: 05/01/2011 Document Reviewed: 08/05/2010 [facility] Patient Information 2015 [place], [place]. This information is not intended to replace advice given to you by your health care provider. Make sure you discuss any questions you have with your health care provider.

## 2014-08-19 ENCOUNTER — Ambulatory Visit (INDEPENDENT_AMBULATORY_CARE_PROVIDER_SITE_OTHER): Payer: Medicare Other | Admitting: Urology

## 2014-08-19 VITALS — BP 119/68 | HR 89 | Ht 67.0 in | Wt 224.6 lb

## 2014-08-19 DIAGNOSIS — R339 Retention of urine, unspecified: Secondary | ICD-10-CM

## 2014-08-19 DIAGNOSIS — N393 Stress incontinence (female) (male): Secondary | ICD-10-CM | POA: Diagnosis not present

## 2014-08-19 DIAGNOSIS — R32 Unspecified urinary incontinence: Secondary | ICD-10-CM

## 2014-08-19 LAB — URINALYSIS, COMPLETE
Bilirubin, UA: NEGATIVE
Glucose, UA: NEGATIVE
KETONES UA: NEGATIVE
LEUKOCYTES UA: NEGATIVE
Nitrite, UA: NEGATIVE
PH UA: 5 (ref 5.0–7.5)
Protein, UA: NEGATIVE
RBC, UA: NEGATIVE
SPEC GRAV UA: 1.01 (ref 1.005–1.030)
Urobilinogen, Ur: 0.2 mg/dL (ref 0.2–1.0)

## 2014-08-19 LAB — MICROSCOPIC EXAMINATION

## 2014-08-19 LAB — BLADDER SCAN AMB NON-IMAGING: Scan Result: 219

## 2014-08-19 NOTE — Progress Notes (Signed)
H&P  Chief Complaint: Urinary incontinence  History of Present Illness: Wanda Gardner is a 74 y.o. year old female who presents with worsening OAB-wet symptoms. She has urgency, UUI, and at times hesitancy. She has mild dysuria but has not been treated for any recent UTS's. She denies gross hematuria. She is diabetic. Sx's get worse when she is constipated. She wears 2 ppd that get damp--not soaked.  Past Medical History  Diagnosis Date  . Acid reflux   . Arthritis   . Diabetes mellitus without complication   . Depression   . Glaucoma   . Hypertension   . Hyperlipidemia   . Sleep apnea     No past surgical history on file.  Home Medications:   (Not in a hospital admission)  Allergies: No Known Allergies  Family History  Problem Relation Age of Onset  . Cancer Father   . Heart disease Mother     Social History:  reports that she has been smoking.  She does not have any smokeless tobacco history on file. She reports that she uses illicit drugs. She reports that she does not drink alcohol.  ROS: A complete review of systems was performed.  All systems are negative except for pertinent findings as noted.  Pos--Urgency,nocturia, dysuria, nocturia, incontinence, intermittency, hesitancy, strains to pee, intermittency, N/V, heartburn, diarrhea, constipation, night sweats, fatigue, rash/itching, blurred/double vision, sinus problems, bruising, leg swelling, chest pain, cough, SOB, polydipsia, back/joint pain, headaches, dizziness, depression, anxiety  Physical Exam:  Vital signs in last 24 hours: @VSRANGES @ General:  Alert and oriented, No acute distress HEENT: Normocephalic, atraumatic Neck: No JVD or lymphadenopathy Cardiovascular: Regular rate and rhythm Lungs: Clear bilaterally Abdomen: Soft, nontender, nondistended, no abdominal masses Back: No CVA tenderness Extremities: No edema Neurologic: Grossly intact  Laboratory Data:  No results found for this or any  previous visit (from the past 24 hour(s)). No results found for this or any previous visit (from the past 240 hour(s)). Creatinine: No results for input(s): CREATININE in the last 168 hours.  Radiologic Imaging: No results found.  PV U/S volume 69 cc Impression/Assessment:  OAB--wet  Plan:  I would prefer that we have her try OAB trainig--will send her a sheet.   Will have her come back in 6 mos to recheck  Jorja Loa 08/19/2014, 2:35 PM  Lillette Boxer. Jonee Lamore Md   t

## 2014-10-29 ENCOUNTER — Ambulatory Visit: Payer: Medicare Other | Admitting: Anesthesiology

## 2014-10-29 ENCOUNTER — Encounter
Admission: RE | Disposition: A | Discharge status: Home / Self Care | Payer: Self-pay | Source: Ambulatory Visit | Attending: Gastroenterology

## 2014-10-29 ENCOUNTER — Ambulatory Visit
Admission: RE | Admit: 2014-10-29 | Discharge: 2014-10-29 | Disposition: A | Discharge status: Home / Self Care | Payer: Medicare Other | Source: Ambulatory Visit | Attending: Gastroenterology | Admitting: Gastroenterology

## 2014-10-29 DIAGNOSIS — F17210 Nicotine dependence, cigarettes, uncomplicated: Secondary | ICD-10-CM | POA: Diagnosis not present

## 2014-10-29 DIAGNOSIS — I10 Essential (primary) hypertension: Secondary | ICD-10-CM | POA: Insufficient documentation

## 2014-10-29 DIAGNOSIS — R1310 Dysphagia, unspecified: Secondary | ICD-10-CM | POA: Insufficient documentation

## 2014-10-29 DIAGNOSIS — H409 Unspecified glaucoma: Secondary | ICD-10-CM | POA: Insufficient documentation

## 2014-10-29 DIAGNOSIS — Z86010 Personal history of colonic polyps: Secondary | ICD-10-CM | POA: Insufficient documentation

## 2014-10-29 DIAGNOSIS — J449 Chronic obstructive pulmonary disease, unspecified: Secondary | ICD-10-CM | POA: Diagnosis not present

## 2014-10-29 DIAGNOSIS — M1990 Unspecified osteoarthritis, unspecified site: Secondary | ICD-10-CM | POA: Insufficient documentation

## 2014-10-29 DIAGNOSIS — Z79899 Other long term (current) drug therapy: Secondary | ICD-10-CM | POA: Insufficient documentation

## 2014-10-29 DIAGNOSIS — F1210 Cannabis abuse, uncomplicated: Secondary | ICD-10-CM | POA: Diagnosis not present

## 2014-10-29 DIAGNOSIS — G4733 Obstructive sleep apnea (adult) (pediatric): Secondary | ICD-10-CM | POA: Diagnosis not present

## 2014-10-29 DIAGNOSIS — R1030 Lower abdominal pain, unspecified: Secondary | ICD-10-CM | POA: Diagnosis present

## 2014-10-29 DIAGNOSIS — F329 Major depressive disorder, single episode, unspecified: Secondary | ICD-10-CM | POA: Diagnosis not present

## 2014-10-29 DIAGNOSIS — Z791 Long term (current) use of non-steroidal anti-inflammatories (NSAID): Secondary | ICD-10-CM | POA: Insufficient documentation

## 2014-10-29 DIAGNOSIS — Z794 Long term (current) use of insulin: Secondary | ICD-10-CM | POA: Insufficient documentation

## 2014-10-29 DIAGNOSIS — E119 Type 2 diabetes mellitus without complications: Secondary | ICD-10-CM | POA: Diagnosis not present

## 2014-10-29 DIAGNOSIS — E785 Hyperlipidemia, unspecified: Secondary | ICD-10-CM | POA: Diagnosis not present

## 2014-10-29 DIAGNOSIS — R194 Change in bowel habit: Secondary | ICD-10-CM | POA: Diagnosis present

## 2014-10-29 DIAGNOSIS — J45909 Unspecified asthma, uncomplicated: Secondary | ICD-10-CM | POA: Insufficient documentation

## 2014-10-29 DIAGNOSIS — K210 Gastro-esophageal reflux disease with esophagitis: Secondary | ICD-10-CM | POA: Insufficient documentation

## 2014-10-29 HISTORY — DX: Headache: R51

## 2014-10-29 HISTORY — PX: ESOPHAGOGASTRODUODENOSCOPY (EGD) WITH PROPOFOL: SHX5813

## 2014-10-29 HISTORY — DX: Chronic obstructive pulmonary disease, unspecified: J44.9

## 2014-10-29 HISTORY — DX: Unspecified asthma, uncomplicated: J45.909

## 2014-10-29 HISTORY — DX: Headache, unspecified: R51.9

## 2014-10-29 HISTORY — PX: COLONOSCOPY WITH PROPOFOL: SHX5780

## 2014-10-29 LAB — GLUCOSE, CAPILLARY: GLUCOSE-CAPILLARY: 109 mg/dL — AB (ref 65–99)

## 2014-10-29 SURGERY — COLONOSCOPY WITH PROPOFOL
Anesthesia: General

## 2014-10-29 MED ORDER — GLYCOPYRROLATE 0.2 MG/ML IJ SOLN
INTRAMUSCULAR | Status: DC | PRN
  Administered 2014-10-29: 0.2 mg via INTRAVENOUS

## 2014-10-29 MED ORDER — PROPOFOL INFUSION 10 MG/ML OPTIME
INTRAVENOUS | Status: DC | PRN
  Administered 2014-10-29: 125 ug/kg/min via INTRAVENOUS

## 2014-10-29 MED ORDER — PROPOFOL 10 MG/ML IV BOLUS
INTRAVENOUS | Status: DC | PRN
  Administered 2014-10-29: 100 mg via INTRAVENOUS

## 2014-10-29 MED ORDER — SODIUM CHLORIDE 0.9 % IV SOLN
INTRAVENOUS | Status: DC
  Administered 2014-10-29: 1000 mL via INTRAVENOUS

## 2014-10-29 MED ORDER — LIDOCAINE HCL (CARDIAC) 20 MG/ML IV SOLN
INTRAVENOUS | Status: DC | PRN
  Administered 2014-10-29: 20 mg via INTRAVENOUS

## 2014-10-29 NOTE — Anesthesia Preprocedure Evaluation (Signed)
Anesthesia Evaluation  Patient identified by MRN, date of birth, ID band Patient awake    Reviewed: Allergy & Precautions, H&P , NPO status , Patient's Chart, lab work & pertinent test results, reviewed documented beta blocker date and time   History of Anesthesia Complications Negative for: history of anesthetic complications  Airway Mallampati: I  TM Distance: >3 FB Neck ROM: full    Dental no notable dental hx. (+) Edentulous Upper, Edentulous Lower   Pulmonary neg shortness of breath, asthma , sleep apnea (uses CPAP ocassionally) and Continuous Positive Airway Pressure Ventilation , COPD,  COPD inhaler, neg recent URI, Current Smoker,    breath sounds clear to auscultation + decreased breath sounds      Cardiovascular Exercise Tolerance: Good hypertension, (-) angina+ Peripheral Vascular Disease  (-) CAD, (-) Past MI, (-) Cardiac Stents and (-) CABG Normal cardiovascular exam(-) dysrhythmias (-) Valvular Problems/Murmurs Rhythm:regular Rate:Normal     Neuro/Psych PSYCHIATRIC DISORDERS (depression) negative neurological ROS     GI/Hepatic Neg liver ROS, GERD  Medicated and Controlled,  Endo/Other  diabetesMorbid obesity  Renal/GU negative Renal ROS  negative genitourinary   Musculoskeletal   Abdominal   Peds  Hematology negative hematology ROS (+)   Anesthesia Other Findings Past Medical History:   Acid reflux                                                  Arthritis                                                    Diabetes mellitus without complication                       Depression                                                   Glaucoma                                                     Hypertension                                                 Hyperlipidemia                                               Sleep apnea                                                  COPD (chronic  obstructive pulmonary  disease)                 Asthma                                                       Headache                                                     Reproductive/Obstetrics negative OB ROS                             Anesthesia Physical Anesthesia Plan  ASA: III  Anesthesia Plan: General   Post-op Pain Management:    Induction:   Airway Management Planned:   Additional Equipment:   Intra-op Plan:   Post-operative Plan:   Informed Consent: I have reviewed the patients History and Physical, chart, labs and discussed the procedure including the risks, benefits and alternatives for the proposed anesthesia with the patient or authorized representative who has indicated his/her understanding and acceptance.   Dental Advisory Given  Plan Discussed with: Anesthesiologist, CRNA and Surgeon  Anesthesia Plan Comments:         Anesthesia Quick Evaluation

## 2014-10-29 NOTE — Op Note (Signed)
[facility] Patient Name: Wanda Gardner Procedure Date: 10/29/2014 9:35 AM MRN: [id number] Account #: [id number] Date of Birth: 1940/11/04 Admit Type: Outpatient Age: 74 Room: [facility] ENDO ROOM 4 Gender: Female Note Status: Finalized Procedure:         Colonoscopy Indications:       Personal history of colonic polyps, Change in bowel habits Providers:         [name]. Candace Cruise, MD Referring MD:      Perrin Maltese, MD (Referring MD) Medicines:         Monitored Anesthesia Care Complications:     No immediate complications. Procedure:         Pre-Anesthesia Assessment:                    - Prior to the procedure, a History and Physical was                     performed, and patient medications, allergies and                     sensitivities were reviewed. The patient's tolerance of                     previous anesthesia was reviewed.                    - The risks and benefits of the procedure and the sedation                     options and risks were discussed with the patient. All                     questions were answered and informed consent was obtained.                    - After reviewing the risks and benefits, the patient was                     deemed in satisfactory condition to undergo the procedure.                    After obtaining informed consent, the colonoscope was                     passed under direct vision. Throughout the procedure, the                     patient's blood pressure, pulse, and oxygen saturations                     were monitored continuously. The Colonoscope was                     introduced through the anus and advanced to the the cecum,                     identified by appendiceal orifice and ileocecal valve. The                     colonoscopy was performed without difficulty. The patient                     tolerated the procedure well. The quality of the bowel  preparation was  poor. Findings:      The colon (entire examined portion) appeared normal. Prep poor. So,       small or even large lesions could easily have been missed. Impression:        - Preparation of the colon was poor.                    - The entire examined colon is normal.                    - No specimens collected. Recommendation:    - Discharge patient to home.                    - Repeat colonoscopy in 5 years for surveillance.                    - The findings and recommendations were discussed with the                     patient.                    - Needs much better prep next time. Procedure Code(s): --- Professional ---                    [phone], Colonoscopy, flexible; diagnostic, including                     collection of specimen(s) by brushing or washing, when                     performed (separate procedure) Diagnosis Code(s): --- Professional ---                    Z86.010, Personal history of colonic polyps                    R19.4, Change in bowel habit CPT copyright 2014 [employer]. All rights reserved. The codes documented in this report are preliminary and upon coder review may  be revised to meet current compliance requirements. Hulen Luster, MD 10/29/2014 10:14:58 AM This report has been signed electronically. Number of Addenda: 0 Note Initiated On: 10/29/2014 9:35 AM Scope Withdrawal Time: 0 hours 12 minutes 37 seconds  Total Procedure Duration: 0 hours 15 minutes 17 seconds       [facility]

## 2014-10-29 NOTE — H&P (Signed)
Primary Care Physician:  Perrin Maltese, MD Primary Gastroenterologist:  Dr. Candace Cruise  Pre-Procedure History & Physical: HPI:  Wanda Gardner is a 74 y.o. female is here for an EGD/colonoscopy.   Past Medical History  Diagnosis Date  . Acid reflux   . Arthritis   . Diabetes mellitus without complication   . Depression   . Glaucoma   . Hypertension   . Hyperlipidemia   . Sleep apnea     Past Surgical History  Procedure Laterality Date  . Cholecystectomy    . Abdominal hysterectomy    . Hand surgery    . Foot surgery    . Eye surgery      Prior to Admission medications   Medication Sig Start Date End Date Taking? Authorizing Provider  Cholecalciferol (VITAMIN D3) 1000 UNITS CAPS Take by mouth.    Historical Provider, MD  diclofenac sodium (VOLTAREN) 1 % GEL Apply topically 4 (four) times daily.    Historical Provider, MD  esomeprazole (NEXIUM) 40 MG capsule twice a day 02/02/14   Historical Provider, MD  fluticasone (VERAMYST) 27.5 MCG/SPRAY nasal spray Place 2 sprays into the nose daily.    Historical Provider, MD  furosemide (LASIX) 20 MG tablet  12/31/13   Historical Provider, MD  hydrochlorothiazide (HYDRODIURIL) 25 MG tablet once a day 04/14/14   Historical Provider, MD  LANTUS SOLOSTAR 100 UNIT/ML Solostar Pen  08/17/14   Historical Provider, MD  losartan (COZAAR) 50 MG tablet  06/29/12   Historical Provider, MD  meloxicam (MOBIC) 15 MG tablet once a day 04/14/14   Historical Provider, MD  metFORMIN (GLUCOPHAGE) 1000 MG tablet twice a day 04/14/14   Historical Provider, MD  NOVOLOG FLEXPEN 100 UNIT/ML FlexPen  08/14/14   Historical Provider, MD  oxyCODONE-acetaminophen (PERCOCET/ROXICET) 5-325 MG per tablet Take 1 tablet by mouth every 4 (four) hours as needed for severe pain. 07/16/14   Paulette Blanch, MD  potassium chloride SA (K-DUR,KLOR-CON) 20 MEQ tablet once a day 04/01/14   Historical Provider, MD  pravastatin (PRAVACHOL) 20 MG tablet Take by mouth. 01/02/07   Historical  Provider, MD  pregabalin (LYRICA) 225 MG capsule Take 225 mg by mouth. 05/15/08   Historical Provider, MD  Prenatal Vit-Fe Fumarate-FA (PRENATAL MULTIVITAMIN) TABS tablet Take 1 tablet by mouth daily at 12 noon.    Historical Provider, MD  promethazine-dextromethorphan (PROMETHAZINE-DM) 6.25-15 MG/5ML syrup Take 5 mLs by mouth 4 (four) times daily as needed for cough. 07/16/14   Paulette Blanch, MD  sertraline (ZOLOFT) 50 MG tablet once a day 04/14/14   Historical Provider, MD  traMADol Veatrice Bourbon) 50 MG tablet 2 three times a day 04/14/14   Historical Provider, MD  Flossie Buffy SHORT PEN NEEDLES 31G X 8 MM [place]  08/14/14   Historical Provider, MD  VENTOLIN HFA 108 (90 BASE) MCG/ACT inhaler  07/13/14   Historical Provider, MD  vitamin B-12 (CYANOCOBALAMIN) 1000 MCG tablet Take 1,000 mcg by mouth daily.    Historical Provider, MD    Allergies as of 09/29/2014  . (No Known Allergies)    Family History  Problem Relation Age of Onset  . Cancer Father   . Heart disease Mother     Social History   Social History  . Marital Status: Divorced    Spouse Name: N/A  . Number of Children: N/A  . Years of Education: N/A   Occupational History  . Not on file.   Social History Main Topics  . Smoking status: Current  Every Day Smoker  . Smokeless tobacco: Not on file  . Alcohol Use: No  . Drug Use: Yes     Comment: weed- daily use  . Sexual Activity: Not on file   Other Topics Concern  . Not on file   Social History Narrative  . No narrative on file    Review of Systems: See HPI, otherwise negative ROS  Physical Exam: There were no vitals taken for this visit. General:   Alert,  pleasant and cooperative in NAD Head:  Normocephalic and atraumatic. Neck:  Supple; no masses or thyromegaly. Lungs:  Clear throughout to auscultation.    Heart:  Regular rate and rhythm. Abdomen:  Soft, nontender and nondistended. Normal bowel sounds, without guarding, and without rebound.   Neurologic:  Alert and   oriented x4;  grossly normal neurologically.  Impression/Plan: Wanda Gardner is here for an EGD/colonoscopy to be performed for changes in bowel habits, lower abdominal pain, and hx of colon polyps.  Risks, benefits, limitations, and alternatives regarding  EGD/colonoscopy  have been reviewed with the patient.  Questions have been answered.  All parties agreeable.   Wanda Gardner, Wanda Dawn, MD  10/29/2014, 8:06 AM

## 2014-10-29 NOTE — Transfer of Care (Signed)
Immediate Anesthesia Transfer of Care Note  Patient: Wanda Gardner  Procedure(s) Performed: Procedure(s): COLONOSCOPY WITH PROPOFOL (N/A) ESOPHAGOGASTRODUODENOSCOPY (EGD) WITH PROPOFOL (N/A)  Patient Location: Endoscopy Unit  Anesthesia Type:General  Level of Consciousness: sedated  Airway & Oxygen Therapy: Patient Spontanous Breathing and Patient connected to nasal cannula oxygen  Post-op Assessment: Report given to RN and Post -op Vital signs reviewed and stable  Post vital signs: Reviewed and stable  Last Vitals:  Filed Vitals:   10/29/14 1017  BP: 96/47  Pulse: 80  Temp: 36.3 C  Resp: 29    Complications: No apparent anesthesia complications

## 2014-10-29 NOTE — Op Note (Signed)
[facility] Patient Name: Wanda Gardner Procedure Date: 10/29/2014 9:22 AM MRN: [id number] Account #: [id number] Date of Birth: Oct 18, 1940 Admit Type: Outpatient Age: 74 Room: [facility] ENDO ROOM 4 Gender: Female Note Status: Finalized Procedure:         Upper GI endoscopy Indications:       Dysphagia, Suspected gastro-esophageal reflux disease Providers:         [name]. Candace Cruise, MD Referring MD:      Perrin Maltese, MD (Referring MD) Medicines:         Monitored Anesthesia Care Complications:     No immediate complications. Procedure:         Pre-Anesthesia Assessment:                    - Prior to the procedure, a History and Physical was                     performed, and patient medications, allergies and                     sensitivities were reviewed. The patient's tolerance of                     previous anesthesia was reviewed.                    - The risks and benefits of the procedure and the sedation                     options and risks were discussed with the patient. All                     questions were answered and informed consent was obtained.                    - After reviewing the risks and benefits, the patient was                     deemed in satisfactory condition to undergo the procedure.                    After obtaining informed consent, the endoscope was passed                     under direct vision. Throughout the procedure, the                     patient's blood pressure, pulse, and oxygen saturations                     were monitored continuously. The Endoscope was introduced                     through the mouth, and advanced to the second part of                     duodenum. The upper GI endoscopy was accomplished without                     difficulty. The patient tolerated the procedure well. Findings:      Esophagitis was found at the gastroesophageal junction. The scope was       withdrawn. Dilation was  performed with a Maloney dilator with mild  resistance at 54 Fr.      The exam was otherwise without abnormality.      The entire examined stomach was normal.      The examined duodenum was normal. Impression:        - Reflux esophagitis. Rule out Barrett's esophagus.                     Dilated.                    - The examination was otherwise normal.                    - Normal stomach.                    - Normal examined duodenum.                    - No specimens collected. Recommendation:    - Discharge patient to home.                    - Observe patient's clinical course.                    - Await pathology results.                    - Continue present medications.                    - The findings and recommendations were discussed with the                     patient. Procedure Code(s): --- Professional ---                    [phone], Esophagogastroduodenoscopy, flexible, transoral;                     diagnostic, including collection of specimen(s) by                     brushing or washing, when performed (separate procedure)                    43450, Dilation of esophagus, by unguided sound or bougie,                     single or multiple passes Diagnosis Code(s): --- Professional ---                    K21.0, Gastro-esophageal reflux disease with esophagitis                    R13.10, Dysphagia, unspecified CPT copyright 2014 American Medical Association. All rights reserved. The codes documented in this report are preliminary and upon coder review may  be revised to meet current compliance requirements. Hulen Luster, MD 10/29/2014 9:54:28 AM This report has been signed electronically. Number of Addenda: 0 Note Initiated On: 10/29/2014 9:22 AM      [facility]

## 2014-10-30 ENCOUNTER — Encounter: Payer: Self-pay | Admitting: Gastroenterology

## 2014-10-30 NOTE — Anesthesia Postprocedure Evaluation (Signed)
  Anesthesia Post-op Note  Patient: Wanda Gardner  Procedure(s) Performed: Procedure(s): COLONOSCOPY WITH PROPOFOL (N/A) ESOPHAGOGASTRODUODENOSCOPY (EGD) WITH PROPOFOL (N/A)  Anesthesia type:General  Patient location: PACU  Post pain: Pain level controlled  Post assessment: Post-op Vital signs reviewed, Patient's Cardiovascular Status Stable, Respiratory Function Stable, Patent Airway and No signs of Nausea or vomiting  Post vital signs: Reviewed and stable  Last Vitals:  Filed Vitals:   10/29/14 1017  BP: 96/47  Pulse: 80  Temp: 36.3 C  Resp: 29    Level of consciousness: awake, alert  and patient cooperative  Complications: No apparent anesthesia complications

## 2014-11-02 LAB — SURGICAL PATHOLOGY

## 2015-01-08 DIAGNOSIS — M7540 Impingement syndrome of unspecified shoulder: Secondary | ICD-10-CM | POA: Insufficient documentation

## 2015-02-18 ENCOUNTER — Encounter: Payer: Self-pay | Admitting: Urology

## 2015-02-18 ENCOUNTER — Ambulatory Visit: Payer: Medicare Other | Admitting: Urology

## 2015-05-26 DIAGNOSIS — M5412 Radiculopathy, cervical region: Secondary | ICD-10-CM | POA: Insufficient documentation

## 2015-10-05 ENCOUNTER — Other Ambulatory Visit: Payer: Self-pay | Admitting: Orthopedic Surgery

## 2015-10-05 DIAGNOSIS — M1711 Unilateral primary osteoarthritis, right knee: Secondary | ICD-10-CM

## 2015-10-11 ENCOUNTER — Other Ambulatory Visit: Payer: Self-pay | Admitting: Internal Medicine

## 2015-10-11 DIAGNOSIS — Z1231 Encounter for screening mammogram for malignant neoplasm of breast: Secondary | ICD-10-CM

## 2015-10-26 ENCOUNTER — Emergency Department
Admission: EM | Admit: 2015-10-26 | Discharge: 2015-10-26 | Disposition: A | Discharge status: Home / Self Care | Payer: Medicare HMO | Attending: Emergency Medicine | Admitting: Emergency Medicine

## 2015-10-26 ENCOUNTER — Encounter: Payer: Self-pay | Admitting: Emergency Medicine

## 2015-10-26 ENCOUNTER — Emergency Department: Payer: Medicare HMO

## 2015-10-26 DIAGNOSIS — G8929 Other chronic pain: Secondary | ICD-10-CM | POA: Insufficient documentation

## 2015-10-26 DIAGNOSIS — E119 Type 2 diabetes mellitus without complications: Secondary | ICD-10-CM | POA: Diagnosis not present

## 2015-10-26 DIAGNOSIS — J449 Chronic obstructive pulmonary disease, unspecified: Secondary | ICD-10-CM | POA: Diagnosis not present

## 2015-10-26 DIAGNOSIS — I10 Essential (primary) hypertension: Secondary | ICD-10-CM | POA: Insufficient documentation

## 2015-10-26 DIAGNOSIS — Z7984 Long term (current) use of oral hypoglycemic drugs: Secondary | ICD-10-CM | POA: Insufficient documentation

## 2015-10-26 DIAGNOSIS — M5416 Radiculopathy, lumbar region: Secondary | ICD-10-CM | POA: Diagnosis not present

## 2015-10-26 DIAGNOSIS — J45909 Unspecified asthma, uncomplicated: Secondary | ICD-10-CM | POA: Diagnosis not present

## 2015-10-26 DIAGNOSIS — Z79899 Other long term (current) drug therapy: Secondary | ICD-10-CM | POA: Insufficient documentation

## 2015-10-26 DIAGNOSIS — Z791 Long term (current) use of non-steroidal anti-inflammatories (NSAID): Secondary | ICD-10-CM | POA: Diagnosis not present

## 2015-10-26 DIAGNOSIS — M545 Low back pain: Secondary | ICD-10-CM | POA: Diagnosis present

## 2015-10-26 DIAGNOSIS — Z794 Long term (current) use of insulin: Secondary | ICD-10-CM | POA: Diagnosis not present

## 2015-10-26 DIAGNOSIS — M549 Dorsalgia, unspecified: Secondary | ICD-10-CM

## 2015-10-26 DIAGNOSIS — F17210 Nicotine dependence, cigarettes, uncomplicated: Secondary | ICD-10-CM | POA: Insufficient documentation

## 2015-10-26 MED ORDER — NAPROXEN 500 MG PO TBEC: 500.0000 mg | DELAYED_RELEASE_TABLET | Freq: Two times a day (BID) | ORAL | 0 refills | Status: DC

## 2015-10-26 MED ORDER — DIAZEPAM 2 MG PO TABS
2.0000 mg | ORAL_TABLET | Freq: Once | ORAL | Status: AC
  Administered 2015-10-26: 2 mg via ORAL
  Filled 2015-10-26: qty 1

## 2015-10-26 MED ORDER — KETOROLAC TROMETHAMINE 60 MG/2ML IM SOLN
30.0000 mg | Freq: Once | INTRAMUSCULAR | Status: AC
Start: 2015-10-26 — End: 2015-10-26
  Administered 2015-10-26: 30 mg via INTRAMUSCULAR
  Filled 2015-10-26: qty 2

## 2015-10-26 MED ORDER — DIAZEPAM 2 MG PO TABS
2.0000 mg | ORAL_TABLET | Freq: Three times a day (TID) | ORAL | 0 refills | Status: DC | PRN
Start: 2015-10-26 — End: 2021-12-09

## 2015-10-26 NOTE — Discharge Instructions (Signed)
Take the prescription meds as directed. Apply moist heat to reduce symptoms. Follow-up with Dr. Humphrey Rolls for continued pain management.

## 2015-10-26 NOTE — ED Triage Notes (Signed)
Pt to ED from home c/o lower right back pain since last night.  Pt states coughed last night and felt like pulled something.  Pt c/o pain radiating to bilateral legs, numbness to right leg.  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  Pt also states "it feels like something is pressing up in my rectum".

## 2015-10-26 NOTE — ED Provider Notes (Signed)
[facility] Emergency Department Provider Note ____________________________________________  Time seen: 2137  I have reviewed the triage vital signs and the nursing notes.  HISTORY  Chief Complaint  Back Pain  HPI Wanda Gardner is a 75 y.o. female presents to the ED for evaluation of low back pain on the right since last night. Patient describes a mild cough last night when she felt an immediate(pulled her lower back. She reports now with pain radiating to the bilateral legs with numbness increase on the right leg. 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 She reports a sensation of fullness in the rectum that is persistent at times, despite normal stool passage. She denies any incontinence of bladder or bowel, urinary frequency, dysuria, or hematuria. She also denies any recent injury, accident, trauma, or fall. She has not had any recent evaluation and management of her chronic back pain. She reports currently being evaluated by Dr. Rudene Christians for a pending right knee replacement.She is scheduled for an outpatient MRI tomorrow to evaluate the right knee.  Past Medical History:  Diagnosis Date  . Acid reflux   . Arthritis   . Asthma   . COPD (chronic obstructive pulmonary disease) ([place])   . Depression   . Diabetes mellitus without complication ([place])   . Glaucoma   . Headache   . Hyperlipidemia   . Hypertension   . Sleep apnea     Patient Active Problem List   Diagnosis Date Noted  . Stress incontinence, female 08/19/2014  . Cephalalgia 07/25/2011  . Central retinal vein occlusion 03/31/2011  . Dry eye syndrome 03/31/2011  . Glaucoma associated with vascular disorder 05/23/2010  . 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 Artificial lens present 03/22/2010  . Chronic glaucoma 03/22/2010  . Cataract, nuclear sclerotic senile 03/22/2010  . Diabetes mellitus type 2, uncontrolled ([place])  07/26/2007  . Encounter for general adult medical examination without abnormal findings 12/23/2003  . Polypharmacy 02/03/2003    Past Surgical History:  Procedure Laterality Date  . ABDOMINAL HYSTERECTOMY    . CHOLECYSTECTOMY    . COLONOSCOPY WITH PROPOFOL N/A 10/29/2014   Procedure: COLONOSCOPY WITH PROPOFOL;  Surgeon: Hulen Luster, MD;  [facility] END[facility]COPY;  Service: Gastroenterology;  Laterality: N/A;  . ESOPHAGOGASTRODUODENOSCOPY (EGD) WITH PROPOFOL N/A 10/29/2014   Procedure: ESOPHAGOGASTRODUODENOSCOPY (EGD) WITH PROPOFOL;  Surgeon: Hulen Luster, MD;  Location: [facility] ENDOSCOPY;  Service: Gastroenterology;  Laterality: N/A;  . EYE SURGERY    . FOOT SURGERY    . HAND SURGERY    . TONSILLECTOMY      Prior to Admission medications   Medication Sig Start Date End Date Taking? Authorizing Provider  Cholecalciferol (VITAMIN D3) 1000 UNITS CAPS Take by mouth.    Historical Provider, MD  diazepam (VALIUM) 2 MG tablet Take 1 tablet (2 mg total) by mouth every 8 (eight) hours as needed for muscle spasms. 10/26/15   Keara Pagliarulo V Bacon Ogden Handlin, PA-C  diclofenac sodium (VOLTAREN) 1 % GEL Apply topically 4 (four) times daily.    Historical Provider, MD  esomeprazole (NEXIUM) 40 MG capsule twice a day 02/02/14   Historical Provider, MD  fluticasone (VERAMYST) 27.5 MCG/SPRAY nasal spray Place 2 sprays into the nose daily.    Historical Provider, MD  furosemide (LASIX) 20 MG tablet  12/31/13   Historical Provider, MD  hydrochlorothiazide (HYDRODIURIL) 25 MG tablet once a day 04/14/14   Historical Provider, MD  LANTUS SOLOSTAR 100 UNIT/ML  Solostar Pen  08/17/14   Historical Provider, MD  losartan (COZAAR) 50 MG tablet  06/29/12   Historical Provider, MD  meloxicam (MOBIC) 15 MG tablet once a day 04/14/14   Historical Provider, MD  metFORMIN (GLUCOPHAGE) 1000 MG tablet twice a day 04/14/14   Historical Provider, MD  naproxen (EC NAPROSYN) 500 MG EC tablet Take 1 tablet (500 mg total) by mouth 2 (two) times daily  with a meal. 10/26/15   Fergus Throne V Bacon Josimar Corning, PA-C  NOVOLOG FLEXPEN 100 UNIT/ML FlexPen  08/14/14   Historical Provider, MD  oxyCODONE-acetaminophen (PERCOCET/ROXICET) 5-325 MG per tablet Take 1 tablet by mouth every 4 (four) hours as needed for severe pain. 07/16/14   Paulette Blanch, MD  potassium chloride SA (K-DUR,KLOR-CON) 20 MEQ tablet once a day 04/01/14   Historical Provider, MD  pravastatin (PRAVACHOL) 20 MG tablet Take by mouth. 01/02/07   Historical Provider, MD  pregabalin (LYRICA) 225 MG capsule Take 225 mg by mouth. 05/15/08   Historical Provider, MD  Prenatal Vit-Fe Fumarate-FA (PRENATAL MULTIVITAMIN) TABS tablet Take 1 tablet by mouth daily at 12 noon.    Historical Provider, MD  promethazine-dextromethorphan (PROMETHAZINE-DM) 6.25-15 MG/5ML syrup Take 5 mLs by mouth 4 (four) times daily as needed for cough. 07/16/14   Paulette Blanch, MD  sertraline (ZOLOFT) 50 MG tablet once a day 04/14/14   Historical Provider, MD  traMADol Veatrice Bourbon) 50 MG tablet 2 three times a day 04/14/14   Historical Provider, MD  Flossie Buffy SHORT PEN NEEDLES 31G X 8 MM [place]  08/14/14   Historical Provider, MD  VENTOLIN HFA 108 (90 BASE) MCG/ACT inhaler  07/13/14   Historical Provider, MD  vitamin B-12 (CYANOCOBALAMIN) 1000 MCG tablet Take 1,000 mcg by mouth daily.    Historical Provider, MD    Allergies Review of patient's allergies indicates no known allergies.  Family History  Problem Relation Age of Onset  . Cancer Father   . Heart disease Mother     Social History Social History  Substance Use Topics  . Smoking status: Current Every Day Smoker    Packs/day: 0.25    Types: Cigarettes  . Smokeless tobacco: Never Used  . Alcohol use No    Review of Systems  Constitutional: Negative for fever. Cardiovascular: Negative for chest pain. Respiratory: Negative for shortness of breath. Gastrointestinal: Negative for abdominal pain, vomiting and diarrhea. Genitourinary: Negative for dysuria, incontinence, or  hematuria Musculoskeletal: Positive for back pain. Neurological: Negative for headaches, focal weakness or numbness. ____________________________________________  PHYSICAL EXAM:  VITAL SIGNS: ED Triage Vitals  Enc Vitals Group     BP 10/26/15 2104 128/66     Pulse Rate 10/26/15 2104 81     Resp 10/26/15 2104 20     Temp 10/26/15 2104 98.2 F (36.8 C)     Temp Source 10/26/15 2104 Oral     SpO2 10/26/15 2104 98 %     Weight 10/26/15 2105 230 lb (104.3 kg)     Height 10/26/15 2105 5\' 7"  (1.702 m)     Head Circumference --      Peak Flow --      Pain Score 10/26/15 2108 10     Pain Loc --      Pain Edu? --      Excl. in [place]? --    Constitutional: Alert and oriented. Well appearing and in no distress. Head: Normocephalic and atraumatic. Cardiovascular: Normal rate, regular rhythm. Normal distal pulses. Respiratory: Normal respiratory effort. No wheezes/rales/rhonchi. Gastrointestinal:  Soft and nontender. No distention, rebound, guarding, organomegaly. No CVA tenderness. Musculoskeletal: Patient with normal spinal on it without midline tenderness, spasm, deformity, step-off. She is tender to palpation slightly over the right greater than left sacral region. She transitions from supine to sit without assistance. Nontender with normal range of motion in all extremities.  Neurologic: Cranial nerves II through XII grossly intact. Normal LE DTRs bilaterally. Normal gait without ataxia. Normal speech and language. No gross focal neurologic deficits are appreciated. Skin:  Skin is warm, dry and intact. No rash noted. ____________________________________________   RADIOLOGY  Lumbar Spine  IMPRESSION: 1. Prominent spondylosis and degenerative disc disease with suspected impingement at the lowest 3 lumbar intervertebral levels. There appeared only be 4 lumbar type non-rib-bearing vertebra, and accordingly these levels are considered to be L2-3, L3-4, and L4-S1. No subluxation or fracture  identified. 2.  Aortoiliac atherosclerotic vascular disease. ____________________________________________  PROCEDURES  Toradol 30 mg IM Valium 2 mg PO ____________________________________________  INITIAL IMPRESSION / ASSESSMENT AND PLAN / ED COURSE  Patient with acute flare of chronic low back pain with LE radicular symptoms on the Right. Her x-ray is reassuring and stable at this time, with findings representative of her pre-existing degenerative disc disease and radiculopathy. She is discharged at this time with prescriptions for EC Naprosyn and Vicodin to dose as directed. She will follow-up with primary care provider for ongoing symptom management. Return precautions are reviewed.  Clinical Course   ____________________________________________  FINAL CLINICAL IMPRESSION(S) / ED DIAGNOSES  Final diagnoses:  Chronic back pain  Lumbar radicular pain      Melvenia Needles, PA-C 10/26/15 [id number]    Nance Pear, MD 10/27/15 1801

## 2015-10-27 ENCOUNTER — Ambulatory Visit
Admission: RE | Admit: 2015-10-27 | Discharge: 2015-10-27 | Disposition: A | Discharge status: Home / Self Care | Payer: Medicare HMO | Source: Ambulatory Visit | Attending: Orthopedic Surgery | Admitting: Orthopedic Surgery

## 2015-10-27 DIAGNOSIS — M1711 Unilateral primary osteoarthritis, right knee: Secondary | ICD-10-CM | POA: Insufficient documentation

## 2015-11-10 ENCOUNTER — Encounter
Admission: RE | Admit: 2015-11-10 | Discharge: 2015-11-10 | Disposition: A | Discharge status: Home / Self Care | Payer: Medicare HMO | Source: Ambulatory Visit | Attending: Orthopedic Surgery | Admitting: Orthopedic Surgery

## 2015-11-10 DIAGNOSIS — M25561 Pain in right knee: Secondary | ICD-10-CM | POA: Diagnosis not present

## 2015-11-10 DIAGNOSIS — E119 Type 2 diabetes mellitus without complications: Secondary | ICD-10-CM | POA: Diagnosis not present

## 2015-11-10 DIAGNOSIS — Z7982 Long term (current) use of aspirin: Secondary | ICD-10-CM | POA: Insufficient documentation

## 2015-11-10 DIAGNOSIS — Z01812 Encounter for preprocedural laboratory examination: Secondary | ICD-10-CM | POA: Insufficient documentation

## 2015-11-10 DIAGNOSIS — Z794 Long term (current) use of insulin: Secondary | ICD-10-CM | POA: Insufficient documentation

## 2015-11-10 DIAGNOSIS — Z01810 Encounter for preprocedural cardiovascular examination: Secondary | ICD-10-CM | POA: Diagnosis not present

## 2015-11-10 DIAGNOSIS — G4730 Sleep apnea, unspecified: Secondary | ICD-10-CM | POA: Insufficient documentation

## 2015-11-10 DIAGNOSIS — E785 Hyperlipidemia, unspecified: Secondary | ICD-10-CM | POA: Diagnosis not present

## 2015-11-10 DIAGNOSIS — Z79899 Other long term (current) drug therapy: Secondary | ICD-10-CM | POA: Insufficient documentation

## 2015-11-10 DIAGNOSIS — M1711 Unilateral primary osteoarthritis, right knee: Secondary | ICD-10-CM | POA: Diagnosis not present

## 2015-11-10 DIAGNOSIS — I10 Essential (primary) hypertension: Secondary | ICD-10-CM | POA: Insufficient documentation

## 2015-11-10 DIAGNOSIS — K210 Gastro-esophageal reflux disease with esophagitis: Secondary | ICD-10-CM | POA: Diagnosis not present

## 2015-11-10 DIAGNOSIS — Z9889 Other specified postprocedural states: Secondary | ICD-10-CM | POA: Insufficient documentation

## 2015-11-10 HISTORY — DX: Neuralgia and neuritis, unspecified: M79.2

## 2015-11-10 HISTORY — DX: Nausea with vomiting, unspecified: R11.2

## 2015-11-10 HISTORY — DX: Other specified postprocedural states: Z98.890

## 2015-11-10 LAB — BASIC METABOLIC PANEL
ANION GAP: 8 (ref 5–15)
BUN: 20 mg/dL (ref 6–20)
CHLORIDE: 101 mmol/L (ref 101–111)
CO2: 30 mmol/L (ref 22–32)
Calcium: 9.6 mg/dL (ref 8.9–10.3)
Creatinine, Ser: 1.01 mg/dL — ABNORMAL HIGH (ref 0.44–1.00)
GFR calc Af Amer: >60 mL/min (ref >60)
GFR calc non Af Amer: 54 mL/min — ABNORMAL LOW (ref >60)
GLUCOSE: 113 mg/dL — AB (ref 65–99)
POTASSIUM: 4 mmol/L (ref 3.5–5.1)
Sodium: 139 mmol/L (ref 135–145)

## 2015-11-10 LAB — URINALYSIS COMPLETE WITH MICROSCOPIC (ARMC ONLY)
BILIRUBIN URINE: NEGATIVE
Bacteria, UA: NONE SEEN
Glucose, UA: NEGATIVE mg/dL
HGB URINE DIPSTICK: NEGATIVE
KETONES UR: NEGATIVE mg/dL
LEUKOCYTES UA: NEGATIVE
NITRITE: NEGATIVE
PH: 5 (ref 5.0–8.0)
Protein, ur: NEGATIVE mg/dL
Specific Gravity, Urine: 1.013 (ref 1.005–1.030)
Squamous Epithelial / LPF: NONE SEEN

## 2015-11-10 LAB — CBC
HEMATOCRIT: 41 % (ref 35.0–47.0)
HEMOGLOBIN: 14 g/dL (ref 12.0–16.0)
MCH: 29 pg (ref 26.0–34.0)
MCHC: 34.1 g/dL (ref 32.0–36.0)
MCV: 85.2 fL (ref 80.0–100.0)
Platelets: 320 10*3/uL (ref 150–440)
RBC: 4.81 MIL/uL (ref 3.80–5.20)
RDW: 14.6 % — AB (ref 11.5–14.5)
WBC: 8.9 10*3/uL (ref 3.6–11.0)

## 2015-11-10 LAB — PROTIME-INR
INR: 0.96
Prothrombin Time: 12.8 seconds (ref 11.4–15.2)

## 2015-11-10 LAB — TYPE AND SCREEN
ABO/RH(D): O POS
Antibody Screen: NEGATIVE

## 2015-11-10 LAB — APTT: APTT: 39 s — AB (ref 24–36)

## 2015-11-10 LAB — SEDIMENTATION RATE: Sed Rate: 18 mm/hr (ref 0–30)

## 2015-11-10 LAB — SURGICAL PCR SCREEN
MRSA, PCR: NEGATIVE
Staphylococcus aureus: NEGATIVE

## 2015-11-10 NOTE — Pre-Procedure Instructions (Signed)
REQUEST FOR MEDICAL CLEARANCE/ EKG,AS INSTRUCTED BY DR [place] FAXED TO DR Wolfgang Phoenix. HAD TO LEAVE INFO WITH ANSWERING SERVICE. ALSO FAXED TO DR [facility] AND LM FOR TIFFANY

## 2015-11-10 NOTE — Patient Instructions (Addendum)
  Your procedure is scheduled [id number] Oct. 3 , 2017. Report to Same Day Surgery. To find out your arrival time please call [phone] between 1PM - 3PM on Monday Oct. 2, 2017.  Remember: Instructions that are not followed completely may result in serious medical risk, up to and including death, or upon the discretion of your surgeon and anesthesiologist your surgery may need to be rescheduled.    _x___ 1. Do not eat food or drink liquids after midnight. No gum chewing or hard candies.     _x__ 2. No Alcohol for 24 hours before or after surgery.   ____ 3. Bring all medications with you on the day of surgery if instructed.    __x__ 4. Notify your doctor if there is any change in your medical condition     (cold, fever, infections).    __x___ 5. No smoking 24 hours prior to surgery.     Do not wear jewelry, make-up, hairpins, clips or nail polish.  Do not wear lotions, powders, or perfumes.   Do not shave 48 hours prior to surgery. Men may shave face and neck.  Do not bring valuables to the hospital.    [facility] is not responsible for any belongings or valuables.               Contacts, dentures or bridgework may not be worn into surgery.  Leave your suitcase in the car. After surgery it may be brought to your room.  For patients admitted to the hospital, discharge time is determined by your treatment team.   Patients discharged the day of surgery will not be allowed to drive home.    Please read over the following fact sheets that you were given:   [facility] Preparing for Surgery  _x___ Take these medicines the morning of surgery with A SIP OF WATER:    1. esomeprazole ([place])  2. losartan (COZAAR)  3. pregabalin (LYRICA)   ____ Fleet Enema (as directed)   __x__ Use CHG Soap as directed on instruction sheet  __x__ Use inhalers on the day of surgery and bring to hospital day of surgery  _x_ Stop metformin 2 days prior to surgery On November 21, 2015.   _x___  Take 1/2 of usual insulin dose the night before surgery and none on the morning of surgery.   _x___ Stop aspirin on Sept. 19, 2017.  __x__ Stop Anti-inflammatories such as Advil, Aleve, Ibuprofen, Motrin, Naproxen, Naprosyn, Goodies powders or aspirin products. OK to take Tylenol.   __x__ Stop supplement vitamin B-12 s  until after surgery.    __x__ Bring C-Pap to the hospital.

## 2015-11-11 LAB — URINE CULTURE
Culture: NO GROWTH
Special Requests: NORMAL

## 2015-11-11 NOTE — Pre-Procedure Instructions (Signed)
CLEARED LOW RISK BY DR Chauncey Cruel [facility] 11/11/15

## 2015-11-23 ENCOUNTER — Encounter
Admission: RE | Disposition: A | Discharge status: Skilled Nursing Facility | Payer: Self-pay | Source: Ambulatory Visit | Attending: Orthopedic Surgery

## 2015-11-23 ENCOUNTER — Encounter: Payer: Self-pay | Admitting: *Deleted

## 2015-11-23 ENCOUNTER — Inpatient Hospital Stay
Admission: RE | Admit: 2015-11-23 | Discharge: 2015-11-26 | DRG: 470 | Disposition: A | Discharge status: Skilled Nursing Facility | Payer: Medicare HMO | Source: Ambulatory Visit | Attending: Orthopedic Surgery | Admitting: Orthopedic Surgery

## 2015-11-23 ENCOUNTER — Inpatient Hospital Stay: Payer: Medicare HMO | Admitting: Anesthesiology

## 2015-11-23 ENCOUNTER — Inpatient Hospital Stay: Payer: Medicare HMO

## 2015-11-23 DIAGNOSIS — K219 Gastro-esophageal reflux disease without esophagitis: Secondary | ICD-10-CM | POA: Diagnosis present

## 2015-11-23 DIAGNOSIS — Z23 Encounter for immunization: Secondary | ICD-10-CM | POA: Diagnosis not present

## 2015-11-23 DIAGNOSIS — G4730 Sleep apnea, unspecified: Secondary | ICD-10-CM | POA: Diagnosis present

## 2015-11-23 DIAGNOSIS — R262 Difficulty in walking, not elsewhere classified: Secondary | ICD-10-CM

## 2015-11-23 DIAGNOSIS — M6281 Muscle weakness (generalized): Secondary | ICD-10-CM

## 2015-11-23 DIAGNOSIS — M179 Osteoarthritis of knee, unspecified: Secondary | ICD-10-CM | POA: Diagnosis present

## 2015-11-23 DIAGNOSIS — I10 Essential (primary) hypertension: Secondary | ICD-10-CM | POA: Diagnosis present

## 2015-11-23 DIAGNOSIS — J449 Chronic obstructive pulmonary disease, unspecified: Secondary | ICD-10-CM | POA: Diagnosis present

## 2015-11-23 DIAGNOSIS — G8918 Other acute postprocedural pain: Secondary | ICD-10-CM

## 2015-11-23 DIAGNOSIS — M21061 Valgus deformity, not elsewhere classified, right knee: Secondary | ICD-10-CM | POA: Diagnosis present

## 2015-11-23 DIAGNOSIS — E1142 Type 2 diabetes mellitus with diabetic polyneuropathy: Secondary | ICD-10-CM | POA: Diagnosis present

## 2015-11-23 DIAGNOSIS — E785 Hyperlipidemia, unspecified: Secondary | ICD-10-CM | POA: Diagnosis present

## 2015-11-23 DIAGNOSIS — Z7982 Long term (current) use of aspirin: Secondary | ICD-10-CM | POA: Diagnosis not present

## 2015-11-23 DIAGNOSIS — E1151 Type 2 diabetes mellitus with diabetic peripheral angiopathy without gangrene: Secondary | ICD-10-CM | POA: Diagnosis present

## 2015-11-23 DIAGNOSIS — M1711 Unilateral primary osteoarthritis, right knee: Secondary | ICD-10-CM | POA: Diagnosis present

## 2015-11-23 DIAGNOSIS — Z794 Long term (current) use of insulin: Secondary | ICD-10-CM | POA: Diagnosis not present

## 2015-11-23 HISTORY — PX: TOTAL KNEE ARTHROPLASTY: SHX125

## 2015-11-23 LAB — URINE DRUG SCREEN, QUALITATIVE (ARMC ONLY)
AMPHETAMINES, UR SCREEN: NOT DETECTED
BENZODIAZEPINE, UR SCRN: NOT DETECTED
Barbiturates, Ur Screen: NOT DETECTED
Cannabinoid 50 Ng, Ur ~~LOC~~: POSITIVE — AB
Cocaine Metabolite,Ur ~~LOC~~: NOT DETECTED
MDMA (ECSTASY) UR SCREEN: NOT DETECTED
METHADONE SCREEN, URINE: NOT DETECTED
OPIATE, UR SCREEN: NOT DETECTED
Phencyclidine (PCP) Ur S: NOT DETECTED
Tricyclic, Ur Screen: NOT DETECTED

## 2015-11-23 LAB — CBC
HEMATOCRIT: 36 % (ref 35.0–47.0)
Hemoglobin: 12.5 g/dL (ref 12.0–16.0)
MCH: 29.5 pg (ref 26.0–34.0)
MCHC: 34.6 g/dL (ref 32.0–36.0)
MCV: 85.1 fL (ref 80.0–100.0)
PLATELETS: 248 10*3/uL (ref 150–440)
RBC: 4.23 MIL/uL (ref 3.80–5.20)
RDW: 14.6 % — AB (ref 11.5–14.5)
WBC: 5.7 10*3/uL (ref 3.6–11.0)

## 2015-11-23 LAB — GLUCOSE, CAPILLARY
GLUCOSE-CAPILLARY: 240 mg/dL — AB (ref 65–99)
Glucose-Capillary: 195 mg/dL — ABNORMAL HIGH (ref 65–99)
Glucose-Capillary: 179 mg/dL — ABNORMAL HIGH (ref 65–99)
Glucose-Capillary: 198 mg/dL — ABNORMAL HIGH (ref 65–99)
Glucose-Capillary: 219 mg/dL — ABNORMAL HIGH (ref 65–99)

## 2015-11-23 LAB — CREATININE, SERUM
Creatinine, Ser: 1.1 mg/dL — ABNORMAL HIGH (ref 0.44–1.00)
GFR calc Af Amer: 56 mL/min — ABNORMAL LOW
GFR calc non Af Amer: 49 mL/min — ABNORMAL LOW

## 2015-11-23 LAB — ABO/RH: ABO/RH(D): O POS

## 2015-11-23 SURGERY — ARTHROPLASTY, KNEE, TOTAL
Anesthesia: Spinal | Site: Knee | Laterality: Right | Wound class: Clean

## 2015-11-23 MED ORDER — PROPOFOL 500 MG/50ML IV EMUL
INTRAVENOUS | Status: DC | PRN
  Administered 2015-11-23: 50 ug/kg/min via INTRAVENOUS

## 2015-11-23 MED ORDER — SODIUM CHLORIDE 0.9 % IV SOLN
INTRAVENOUS | Status: DC
  Administered 2015-11-23 (×2): via INTRAVENOUS

## 2015-11-23 MED ORDER — BUPIVACAINE HCL (PF) 0.5 % IJ SOLN
INTRAMUSCULAR | Status: DC | PRN
  Administered 2015-11-23: 3 mL

## 2015-11-23 MED ORDER — BISACODYL 10 MG RE SUPP
10.0000 mg | Freq: Every day | RECTAL | Status: DC | PRN
  Administered 2015-11-26: 10 mg via RECTAL
  Filled 2015-11-23: qty 1

## 2015-11-23 MED ORDER — SODIUM CHLORIDE 0.9 % IJ SOLN
INTRAMUSCULAR | Status: AC
  Filled 2015-11-23: qty 50

## 2015-11-23 MED ORDER — EPHEDRINE SULFATE 50 MG/ML IJ SOLN
INTRAMUSCULAR | Status: DC | PRN
  Administered 2015-11-23: 5 mg via INTRAVENOUS
  Administered 2015-11-23: 10 mg via INTRAVENOUS

## 2015-11-23 MED ORDER — MORPHINE SULFATE (PF) 10 MG/ML IV SOLN
INTRAVENOUS | Status: AC
  Filled 2015-11-23: qty 1

## 2015-11-23 MED ORDER — FLUTICASONE FUROATE 27.5 MCG/SPRAY NA SUSP: 2.0000 | Freq: Every day | NASAL | Status: DC

## 2015-11-23 MED ORDER — ACETAMINOPHEN 10 MG/ML IV SOLN
INTRAVENOUS | Status: AC
  Filled 2015-11-23: qty 100

## 2015-11-23 MED ORDER — VITAMIN B-12 1000 MCG PO TABS
1000.0000 ug | ORAL_TABLET | Freq: Every day | ORAL | Status: DC
  Administered 2015-11-24 – 2015-11-26 (×3): 1000 ug via ORAL
  Filled 2015-11-23 (×3): qty 1

## 2015-11-23 MED ORDER — MENTHOL 3 MG MT LOZG
1.0000 | LOZENGE | OROMUCOSAL | Status: DC | PRN
  Filled 2015-11-23: qty 9

## 2015-11-23 MED ORDER — ONDANSETRON HCL 4 MG/2ML IJ SOLN: 4.0000 mg | Freq: Once | INTRAMUSCULAR | Status: DC | PRN

## 2015-11-23 MED ORDER — TRANEXAMIC ACID 1000 MG/10ML IV SOLN
1500.0000 mg | INTRAVENOUS | Status: AC
  Administered 2015-11-23: 1500 mg via INTRAVENOUS
  Filled 2015-11-23: qty 15

## 2015-11-23 MED ORDER — ONDANSETRON HCL 4 MG PO TABS: 4.0000 mg | ORAL_TABLET | Freq: Four times a day (QID) | ORAL | Status: DC | PRN

## 2015-11-23 MED ORDER — METHOCARBAMOL 500 MG PO TABS: 500.0000 mg | ORAL_TABLET | Freq: Four times a day (QID) | ORAL | Status: DC | PRN

## 2015-11-23 MED ORDER — NEOMYCIN-POLYMYXIN B GU 40-200000 IR SOLN
Status: AC
  Filled 2015-11-23: qty 20

## 2015-11-23 MED ORDER — MORPHINE SULFATE (PF) 2 MG/ML IV SOLN
2.0000 mg | INTRAVENOUS | Status: DC | PRN
  Administered 2015-11-23 (×2): 2 mg via INTRAVENOUS
  Filled 2015-11-23 (×2): qty 1

## 2015-11-23 MED ORDER — INFLUENZA VAC SPLIT QUAD 0.5 ML IM SUSY
0.5000 mL | PREFILLED_SYRINGE | INTRAMUSCULAR | Status: AC
  Administered 2015-11-24: 0.5 mL via INTRAMUSCULAR
  Filled 2015-11-23: qty 0.5

## 2015-11-23 MED ORDER — NEOMYCIN-POLYMYXIN B GU 40-200000 IR SOLN
Status: DC | PRN
  Administered 2015-11-23: 14 mL

## 2015-11-23 MED ORDER — ACETAMINOPHEN 325 MG PO TABS: 650.0000 mg | ORAL_TABLET | Freq: Four times a day (QID) | ORAL | Status: DC | PRN

## 2015-11-23 MED ORDER — MAGNESIUM HYDROXIDE 400 MG/5ML PO SUSP
30.0000 mL | Freq: Every day | ORAL | Status: DC | PRN
  Administered 2015-11-24 – 2015-11-26 (×3): 30 mL via ORAL
  Filled 2015-11-23 (×3): qty 30

## 2015-11-23 MED ORDER — OXYCODONE HCL 5 MG PO TABS
5.0000 mg | ORAL_TABLET | ORAL | Status: DC | PRN
  Administered 2015-11-23 (×2): 5 mg via ORAL
  Administered 2015-11-23 – 2015-11-26 (×11): 10 mg via ORAL
  Filled 2015-11-23 (×3): qty 2
  Filled 2015-11-23: qty 1
  Filled 2015-11-23 (×8): qty 2
  Filled 2015-11-23: qty 1
  Filled 2015-11-23: qty 2

## 2015-11-23 MED ORDER — CEFAZOLIN SODIUM-DEXTROSE 2-4 GM/100ML-% IV SOLN
2.0000 g | Freq: Once | INTRAVENOUS | Status: AC
  Administered 2015-11-23: 2 g via INTRAVENOUS

## 2015-11-23 MED ORDER — BUPIVACAINE-EPINEPHRINE (PF) 0.25% -1:200000 IJ SOLN
INTRAMUSCULAR | Status: AC
  Filled 2015-11-23: qty 30

## 2015-11-23 MED ORDER — LOSARTAN POTASSIUM 50 MG PO TABS
50.0000 mg | ORAL_TABLET | Freq: Every day | ORAL | Status: DC
  Administered 2015-11-24 – 2015-11-26 (×3): 50 mg via ORAL
  Filled 2015-11-23 (×3): qty 1

## 2015-11-23 MED ORDER — METOCLOPRAMIDE HCL 10 MG PO TABS
5.0000 mg | ORAL_TABLET | Freq: Three times a day (TID) | ORAL | Status: DC | PRN
Start: 2015-11-23 — End: 2015-11-26

## 2015-11-23 MED ORDER — POTASSIUM CHLORIDE CRYS ER 20 MEQ PO TBCR
20.0000 meq | EXTENDED_RELEASE_TABLET | Freq: Every day | ORAL | Status: DC
  Administered 2015-11-23 – 2015-11-26 (×4): 20 meq via ORAL
  Filled 2015-11-23 (×4): qty 1

## 2015-11-23 MED ORDER — METFORMIN HCL 500 MG PO TABS
500.0000 mg | ORAL_TABLET | Freq: Two times a day (BID) | ORAL | Status: DC
  Administered 2015-11-23 – 2015-11-26 (×7): 500 mg via ORAL
  Filled 2015-11-23 (×7): qty 1

## 2015-11-23 MED ORDER — MORPHINE SULFATE 10 MG/ML IJ SOLN
INTRAMUSCULAR | Status: DC | PRN
  Administered 2015-11-23: 10 mg

## 2015-11-23 MED ORDER — MAGNESIUM CITRATE PO SOLN
1.0000 | Freq: Once | ORAL | Status: DC | PRN
  Filled 2015-11-23: qty 296

## 2015-11-23 MED ORDER — PANTOPRAZOLE SODIUM 40 MG PO TBEC
40.0000 mg | DELAYED_RELEASE_TABLET | Freq: Two times a day (BID) | ORAL | Status: DC
  Administered 2015-11-23 – 2015-11-26 (×7): 40 mg via ORAL
  Filled 2015-11-23 (×7): qty 1

## 2015-11-23 MED ORDER — PRAVASTATIN SODIUM 20 MG PO TABS
20.0000 mg | ORAL_TABLET | Freq: Every day | ORAL | Status: DC
  Administered 2015-11-23 – 2015-11-25 (×3): 20 mg via ORAL
  Filled 2015-11-23 (×3): qty 1

## 2015-11-23 MED ORDER — BUPIVACAINE-EPINEPHRINE (PF) 0.25% -1:200000 IJ SOLN
INTRAMUSCULAR | Status: DC | PRN
  Administered 2015-11-23: 30 mL

## 2015-11-23 MED ORDER — HYDROMORPHONE HCL 1 MG/ML IJ SOLN
0.5000 mg | Freq: Once | INTRAMUSCULAR | Status: AC
  Administered 2015-11-23: 0.5 mg via INTRAVENOUS
  Filled 2015-11-23: qty 1

## 2015-11-23 MED ORDER — DIAZEPAM 2 MG PO TABS
2.0000 mg | ORAL_TABLET | Freq: Three times a day (TID) | ORAL | Status: DC | PRN
  Administered 2015-11-23: 2 mg via ORAL
  Filled 2015-11-23: qty 1

## 2015-11-23 MED ORDER — FENTANYL CITRATE (PF) 100 MCG/2ML IJ SOLN: 25.0000 ug | INTRAMUSCULAR | Status: DC | PRN

## 2015-11-23 MED ORDER — SODIUM CHLORIDE 0.9 % IV SOLN
INTRAVENOUS | Status: DC | PRN
  Administered 2015-11-23: 50 ug/min via INTRAVENOUS

## 2015-11-23 MED ORDER — INSULIN GLARGINE 100 UNIT/ML SOLOSTAR PEN: 40.0000 [IU] | PEN_INJECTOR | Freq: Every day | SUBCUTANEOUS | Status: DC

## 2015-11-23 MED ORDER — ACETAMINOPHEN 10 MG/ML IV SOLN
INTRAVENOUS | Status: DC | PRN
  Administered 2015-11-23: 1000 mg via INTRAVENOUS

## 2015-11-23 MED ORDER — PANTOPRAZOLE SODIUM 40 MG PO TBEC: 80.0000 mg | DELAYED_RELEASE_TABLET | Freq: Every day | ORAL | Status: DC

## 2015-11-23 MED ORDER — PHENOL 1.4 % MT LIQD
1.0000 | OROMUCOSAL | Status: DC | PRN
  Filled 2015-11-23: qty 177

## 2015-11-23 MED ORDER — FE FUMARATE-B12-VIT C-FA-IFC PO CAPS
1.0000 | ORAL_CAPSULE | Freq: Three times a day (TID) | ORAL | Status: DC
  Administered 2015-11-23 – 2015-11-26 (×10): 1 via ORAL
  Filled 2015-11-23 (×10): qty 1

## 2015-11-23 MED ORDER — INSULIN ASPART 100 UNIT/ML ~~LOC~~ SOLN
0.0000 [IU] | Freq: Three times a day (TID) | SUBCUTANEOUS | Status: DC
  Administered 2015-11-23: 3 [IU] via SUBCUTANEOUS
  Administered 2015-11-23: 5 [IU] via SUBCUTANEOUS
  Administered 2015-11-24 – 2015-11-25 (×6): 3 [IU] via SUBCUTANEOUS
  Administered 2015-11-26: 8 [IU] via SUBCUTANEOUS
  Administered 2015-11-26: 3 [IU] via SUBCUTANEOUS
  Administered 2015-11-26: 8 [IU] via SUBCUTANEOUS
  Filled 2015-11-23 (×3): qty 3
  Filled 2015-11-23: qty 5
  Filled 2015-11-23: qty 3
  Filled 2015-11-23: qty 8
  Filled 2015-11-23 (×4): qty 3
  Filled 2015-11-23: qty 8

## 2015-11-23 MED ORDER — CEFAZOLIN SODIUM-DEXTROSE 2-4 GM/100ML-% IV SOLN
2.0000 g | Freq: Four times a day (QID) | INTRAVENOUS | Status: AC
Start: 2015-11-23 — End: 2015-11-23
  Administered 2015-11-23 (×2): 2 g via INTRAVENOUS
  Filled 2015-11-23 (×2): qty 100

## 2015-11-23 MED ORDER — HYDROCHLOROTHIAZIDE 25 MG PO TABS
25.0000 mg | ORAL_TABLET | Freq: Every day | ORAL | Status: DC
  Administered 2015-11-24 – 2015-11-26 (×3): 25 mg via ORAL
  Filled 2015-11-23 (×3): qty 1

## 2015-11-23 MED ORDER — MIDAZOLAM HCL 5 MG/5ML IJ SOLN
INTRAMUSCULAR | Status: DC | PRN
  Administered 2015-11-23: 2 mg via INTRAVENOUS

## 2015-11-23 MED ORDER — INSULIN ASPART 100 UNIT/ML ~~LOC~~ SOLN
0.0000 [IU] | Freq: Every day | SUBCUTANEOUS | Status: DC
  Administered 2015-11-23: 5 [IU] via SUBCUTANEOUS
  Administered 2015-11-25: 3 [IU] via SUBCUTANEOUS
  Filled 2015-11-23: qty 4
  Filled 2015-11-23: qty 2

## 2015-11-23 MED ORDER — ONDANSETRON HCL 4 MG/2ML IJ SOLN: 4.0000 mg | Freq: Four times a day (QID) | INTRAMUSCULAR | Status: DC | PRN

## 2015-11-23 MED ORDER — FLUTICASONE PROPIONATE 50 MCG/ACT NA SUSP
2.0000 | Freq: Every day | NASAL | Status: DC
  Administered 2015-11-24 – 2015-11-25 (×2): 2 via NASAL
  Filled 2015-11-23: qty 16

## 2015-11-23 MED ORDER — PHENYLEPHRINE HCL 10 MG/ML IJ SOLN
INTRAMUSCULAR | Status: DC | PRN
  Administered 2015-11-23 (×4): 100 ug via INTRAVENOUS

## 2015-11-23 MED ORDER — ENOXAPARIN SODIUM 30 MG/0.3ML ~~LOC~~ SOLN
30.0000 mg | Freq: Two times a day (BID) | SUBCUTANEOUS | Status: DC
  Administered 2015-11-24 – 2015-11-26 (×5): 30 mg via SUBCUTANEOUS
  Filled 2015-11-23 (×5): qty 0.3

## 2015-11-23 MED ORDER — DOCUSATE SODIUM 100 MG PO CAPS
100.0000 mg | ORAL_CAPSULE | Freq: Two times a day (BID) | ORAL | Status: DC
  Administered 2015-11-23 – 2015-11-26 (×7): 100 mg via ORAL
  Filled 2015-11-23 (×7): qty 1

## 2015-11-23 MED ORDER — VITAMIN D 1000 UNITS PO TABS
1000.0000 [IU] | ORAL_TABLET | Freq: Every morning | ORAL | Status: DC
  Administered 2015-11-24 – 2015-11-26 (×3): 1000 [IU] via ORAL
  Filled 2015-11-23 (×3): qty 1

## 2015-11-23 MED ORDER — DIPHENHYDRAMINE HCL 12.5 MG/5ML PO ELIX
12.5000 mg | ORAL_SOLUTION | ORAL | Status: DC | PRN
Start: 2015-11-23 — End: 2015-11-26

## 2015-11-23 MED ORDER — ZOLPIDEM TARTRATE 5 MG PO TABS: 5.0000 mg | ORAL_TABLET | Freq: Every evening | ORAL | Status: DC | PRN

## 2015-11-23 MED ORDER — METOCLOPRAMIDE HCL 5 MG/ML IJ SOLN: 5.0000 mg | Freq: Three times a day (TID) | INTRAMUSCULAR | Status: DC | PRN

## 2015-11-23 MED ORDER — SODIUM CHLORIDE 0.9 % IV SOLN
INTRAVENOUS | Status: DC | PRN
  Administered 2015-11-23: 60 mL

## 2015-11-23 MED ORDER — PROPOFOL 10 MG/ML IV BOLUS
INTRAVENOUS | Status: DC | PRN
  Administered 2015-11-23: 20 mg via INTRAVENOUS

## 2015-11-23 MED ORDER — PREGABALIN 75 MG PO CAPS
225.0000 mg | ORAL_CAPSULE | Freq: Two times a day (BID) | ORAL | Status: DC
  Administered 2015-11-23 – 2015-11-26 (×6): 225 mg via ORAL
  Filled 2015-11-23 (×6): qty 3

## 2015-11-23 MED ORDER — ACETAMINOPHEN 650 MG RE SUPP: 650.0000 mg | Freq: Four times a day (QID) | RECTAL | Status: DC | PRN

## 2015-11-23 MED ORDER — INSULIN GLARGINE 100 UNIT/ML ~~LOC~~ SOLN
40.0000 [IU] | Freq: Every day | SUBCUTANEOUS | Status: DC
  Administered 2015-11-23 – 2015-11-25 (×3): 40 [IU] via SUBCUTANEOUS
  Filled 2015-11-23 (×4): qty 0.4

## 2015-11-23 MED ORDER — BUPIVACAINE LIPOSOME 1.3 % IJ SUSP
INTRAMUSCULAR | Status: AC
  Filled 2015-11-23: qty 20

## 2015-11-23 MED ORDER — SODIUM CHLORIDE 0.9 % IV SOLN
INTRAVENOUS | Status: DC
  Administered 2015-11-23 (×2): via INTRAVENOUS

## 2015-11-23 MED ORDER — ALBUTEROL SULFATE (2.5 MG/3ML) 0.083% IN NEBU: 3.0000 mL | INHALATION_SOLUTION | Freq: Four times a day (QID) | RESPIRATORY_TRACT | Status: DC | PRN

## 2015-11-23 MED ORDER — FUROSEMIDE 20 MG PO TABS
20.0000 mg | ORAL_TABLET | Freq: Every day | ORAL | Status: DC
  Administered 2015-11-24 – 2015-11-26 (×3): 20 mg via ORAL
  Filled 2015-11-23 (×3): qty 1

## 2015-11-23 MED ORDER — METHOCARBAMOL 1000 MG/10ML IJ SOLN
500.0000 mg | Freq: Four times a day (QID) | INTRAVENOUS | Status: DC | PRN
  Filled 2015-11-23: qty 5

## 2015-11-23 SURGICAL SUPPLY — 59 items
BANDAGE ACE 6X5 VEL STRL LF (GAUZE/BANDAGES/DRESSINGS) ×3 IMPLANT
BLADE SAW 1 (BLADE) ×3 IMPLANT
BLOCK CUTTING FEMUR 5 RT MED (MISCELLANEOUS) IMPLANT
BONE CEMENT GENTAMICIN (Cement) ×6 IMPLANT
CANISTER SUCT 1200ML W/VALVE (MISCELLANEOUS) ×3 IMPLANT
CANISTER SUCT 3000ML (MISCELLANEOUS) ×6 IMPLANT
CAPT KNEE TOTAL 3 ×3 IMPLANT
CATH FOL LEG HOLDER (MISCELLANEOUS) ×3 IMPLANT
CATH TRAY METER 16FR LF (MISCELLANEOUS) ×3 IMPLANT
CEMENT BONE GENTAMICIN 40 (Cement) ×2 IMPLANT
CHLORAPREP W/TINT 26ML (MISCELLANEOUS) ×6 IMPLANT
COOLER POLAR GLACIER W/PUMP (MISCELLANEOUS) ×3 IMPLANT
CUFF TOURN 24 STER (MISCELLANEOUS) IMPLANT
CUFF TOURN 30 STER DUAL PORT (MISCELLANEOUS) ×3 IMPLANT
DRAPE INCISE IOBAN 66X45 STRL (DRAPES) ×6 IMPLANT
DRAPE SHEET LG 3/4 BI-LAMINATE (DRAPES) ×6 IMPLANT
ELECT CAUTERY BLADE 6.4 (BLADE) ×3 IMPLANT
ELECT REM PT RETURN 9FT ADLT (ELECTROSURGICAL) ×3
ELECTRODE REM PT RTRN 9FT ADLT (ELECTROSURGICAL) ×1 IMPLANT
GAUZE PETRO XEROFOAM 1X8 (MISCELLANEOUS) ×3 IMPLANT
GAUZE SPONGE 4X4 12PLY STRL (GAUZE/BANDAGES/DRESSINGS) ×3 IMPLANT
GLOVE BIOGEL PI IND STRL 9 (GLOVE) ×1 IMPLANT
GLOVE BIOGEL PI INDICATOR 9 (GLOVE) ×2
GLOVE INDICATOR 8.0 STRL GRN (GLOVE) ×3 IMPLANT
GLOVE SURG ORTHO 8.0 STRL STRW (GLOVE) ×3 IMPLANT
GLOVE SURG SYN 9.0  PF PI (GLOVE) ×2
GLOVE SURG SYN 9.0 PF PI (GLOVE) ×1 IMPLANT
GOWN SRG 2XL LVL 4 RGLN SLV (GOWNS) ×1 IMPLANT
GOWN STRL NON-REIN 2XL LVL4 (GOWNS) ×2
GOWN STRL REUS W/ TWL LRG LVL3 (GOWN DISPOSABLE) ×1 IMPLANT
GOWN STRL REUS W/ TWL XL LVL3 (GOWN DISPOSABLE) ×1 IMPLANT
GOWN STRL REUS W/TWL LRG LVL3 (GOWN DISPOSABLE) ×2
GOWN STRL REUS W/TWL XL LVL3 (GOWN DISPOSABLE) ×2
HANDPIECE INTERPULSE COAX TIP (DISPOSABLE) ×2
HOOD PEEL AWAY FLYTE STAYCOOL (MISCELLANEOUS) ×6 IMPLANT
IMMBOLIZER KNEE 19 BLUE UNIV (SOFTGOODS) ×3 IMPLANT
KIT RM TURNOVER STRD PROC AR (KITS) ×3 IMPLANT
KNEE MEDACTA TIBIAL/FEMORAL BL (Knees) ×3 IMPLANT
KNIFE SCULPS 14X20 (INSTRUMENTS) ×3 IMPLANT
NDL SAFETY 18GX1.5 (NEEDLE) ×3 IMPLANT
NEEDLE SPNL 18GX3.5 QUINCKE PK (NEEDLE) ×3 IMPLANT
NEEDLE SPNL 20GX3.5 QUINCKE YW (NEEDLE) ×3 IMPLANT
NS IRRIG 1000ML POUR BTL (IV SOLUTION) ×3 IMPLANT
PACK TOTAL KNEE (MISCELLANEOUS) ×3 IMPLANT
PAD WRAPON POLAR KNEE (MISCELLANEOUS) ×1 IMPLANT
SET HNDPC FAN SPRY TIP SCT (DISPOSABLE) ×1 IMPLANT
SOL .9 NS 3000ML IRR  AL (IV SOLUTION) ×2
SOL .9 NS 3000ML IRR UROMATIC (IV SOLUTION) ×1 IMPLANT
STAPLER SKIN PROX 35W (STAPLE) ×3 IMPLANT
SUCTION FRAZIER HANDLE 10FR (MISCELLANEOUS) ×2
SUCTION TUBE FRAZIER 10FR DISP (MISCELLANEOUS) ×1 IMPLANT
SUT DVC 2 QUILL PDO  T11 36X36 (SUTURE) ×2
SUT DVC 2 QUILL PDO T11 36X36 (SUTURE) ×1 IMPLANT
SUT DVC QUILL MONODERM 30X30 (SUTURE) ×3 IMPLANT
SYR 20CC LL (SYRINGE) ×3 IMPLANT
SYR 50ML LL SCALE MARK (SYRINGE) ×6 IMPLANT
TOWEL OR 17X26 4PK STRL BLUE (TOWEL DISPOSABLE) ×3 IMPLANT
TOWER CARTRIDGE SMART MIX (DISPOSABLE) ×3 IMPLANT
WRAPON POLAR PAD KNEE (MISCELLANEOUS) ×3

## 2015-11-23 NOTE — Progress Notes (Signed)
Dr. Darvin Neighbours notified of patient's heart rate going to 130's-140's. Stated to give cardiac meds and to give 2 units of pRBC.

## 2015-11-23 NOTE — Progress Notes (Signed)
Right pedal pulse palpable. Right toes pink in color/warm to touch.

## 2015-11-23 NOTE — Evaluation (Signed)
Physical Therapy Evaluation Patient Details Name: Wanda Gardner MRN: [id number] DOB: 1940/03/10 Today's Date: 11/23/2015   History of Present Illness  75 y/o female s/p R total knee replacement 10/3.  Clinical Impression  Pt with sensation restored to R LE, she is sleepy but very eager to participate with PT.  She did well with ~10 minutes of exercises apart from the eval and was able to do 10 SLRs w/o assist and achieved nearly 80 degrees of flexion ROM.  Pt did well with mobility, transfer to standing and was able to walk ~10 ft with walker and no safety concerns.  Pt overall did very well for POD0 session and was highly motivated.  It pt continues on her current trajectory she should be able to return home.     Follow Up Recommendations Home health PT (per continued progress)    Equipment Recommendations       Recommendations for Other Services       Precautions / Restrictions Precautions Precautions: Knee Restrictions Weight Bearing Restrictions: Yes RLE Weight Bearing: Weight bearing as tolerated      Mobility  Bed Mobility Overal bed mobility: Modified Independent             General bed mobility comments: Pt needing rails to get to sitting, but able to get legs off EOB and upright to sitting w/o direct assist  Transfers Overall transfer level: Modified independent Equipment used: Rolling walker (2 wheeled)             General transfer comment: Pt did well getting to standing showing good effort and needing only intial cuing for foot and hand positioing.    Ambulation/Gait Ambulation/Gait assistance: Min guard Ambulation Distance (Feet): 8 Feet Assistive device: Rolling walker (2 wheeled)       General Gait Details: Pt did well with ambulation and did not have any buckling or extreme hesitancy with R WBing.  She did rely on the walker but ultimately was safe and seemed relatively confident with initial [place] attempt  Stairs             Wheelchair Mobility    Modified Rankin (Stroke Patients Only)       Balance Overall balance assessment: Modified Independent                                           Pertinent Vitals/Pain Pain Assessment: 0-10 Pain Score: 7  (increased pain with ROM acts)    Home Living Family/patient expects to be discharged to:: Private residence Living Arrangements: Children Available Help at Discharge: Family (daughter at work during the day)   Home Access: Stairs to enter Entrance Stairs-Rails: Left Entrance Stairs-Number of Steps: Staves: Environmental consultant - 2 wheels;Cane - single point      Prior Function Level of Independence: Independent         Comments: Pt would use cane outside the home, used furniture/walls inside     Hand Dominance        Extremity/Trunk Assessment   Upper Extremity Assessment: Overall WFL for tasks assessed           Lower Extremity Assessment: Overall WFL for tasks assessed (except R LE with expected post-op weakness, grossly 3+/5 t/o)         Communication   Communication: No difficulties  Cognition Arousal/Alertness: Lethargic Behavior During Therapy: WFL for tasks assessed/performed  Overall Cognitive Status: Within Functional Limits for tasks assessed                      General Comments      Exercises Total Joint Exercises Ankle Circles/Pumps: AROM;10 reps Quad Sets: Strengthening;10 reps Gluteal Sets: Strengthening;10 reps Heel Slides: AROM;10 reps Hip ABduction/ADduction: Strengthening;10 reps Straight Leg Raises: AROM;10 reps Knee Flexion: PROM;5 reps Goniometric ROM: 3-78   Assessment/Plan    PT Assessment Patient needs continued PT services  PT Problem List Decreased strength;Decreased range of motion;Decreased activity tolerance;Decreased balance;Decreased mobility;Decreased knowledge of use of DME;Decreased safety awareness;Pain          PT Treatment Interventions DME  instruction;Gait training;Stair training;Functional mobility training;Therapeutic activities;Therapeutic exercise;Balance training;Neuromuscular re-education;Patient/family education    PT Goals (Current goals can be found in the Care Plan section)  Acute Rehab PT Goals Patient Stated Goal: get back to walking well PT Goal Formulation: With patient Time For Goal Achievement: 12/07/15 Potential to Achieve Goals: Good    Frequency BID   Barriers to discharge        Co-evaluation               End of Session Equipment Utilized During Treatment: Gait belt Activity Tolerance: Patient tolerated treatment well Patient left: with call bell/phone within reach;with chair alarm set Nurse Communication: Mobility status         Time: [id number] PT Time Calculation (min) (ACUTE ONLY): 31 min   Charges:   PT Evaluation $PT Eval Low Complexity: 1 Procedure PT Treatments $Therapeutic Exercise: 8-22 mins   PT G Codes:        Kreg Shropshire, DPT 11/23/2015, 4:47 PM

## 2015-11-23 NOTE — Progress Notes (Signed)
Patient had no success with urine specimen.  States She urinated before she came this morning.  Strongly Encouraged the need for urine specimen.

## 2015-11-23 NOTE — Progress Notes (Signed)
Urine specimen sent to lab

## 2015-11-23 NOTE — Progress Notes (Signed)
Eyeglasses, dentures and clothes at bedside.

## 2015-11-23 NOTE — Anesthesia Preprocedure Evaluation (Signed)
Anesthesia Evaluation  Patient identified by MRN, date of birth, ID band Patient awake    Reviewed: Allergy & Precautions, H&P , NPO status , Patient's Chart, lab work & pertinent test results, reviewed documented beta blocker date and time   History of Anesthesia Complications (+) PONV and history of anesthetic complications  Airway Mallampati: IV  TM Distance: >3 FB Neck ROM: full    Dental no notable dental hx. (+) Edentulous Upper, Edentulous Lower   Pulmonary shortness of breath and with exertion, asthma , sleep apnea , COPD,  COPD inhaler, neg recent URI, Current Smoker,    Pulmonary exam normal breath sounds clear to auscultation       Cardiovascular Exercise Tolerance: Good hypertension, (-) angina+ Peripheral Vascular Disease  (-) CAD, (-) Past MI, (-) Cardiac Stents and (-) CABG Normal cardiovascular exam(-) dysrhythmias (-) Valvular Problems/Murmurs Rhythm:regular Rate:Normal     Neuro/Psych PSYCHIATRIC DISORDERS (Depression) negative neurological ROS     GI/Hepatic Neg liver ROS, GERD  ,  Endo/Other  diabetes, Insulin Dependent  Renal/GU negative Renal ROS  negative genitourinary   Musculoskeletal   Abdominal   Peds  Hematology negative hematology ROS (+)   Anesthesia Other Findings Past Medical History: No date: Acid reflux No date: Arthritis No date: Asthma No date: COPD (chronic obstructive pulmonary disease) (* No date: Depression No date: Diabetes mellitus without complication (HCC) No date: Glaucoma No date: Headache     Comment: right side of head No date: Hyperlipidemia No date: Hypertension No date: Neuropathic pain of hands and feet 1982: PONV (postoperative nausea and vomiting)     Comment: back with my hysterectomy. No date: Sleep apnea   Reproductive/Obstetrics negative OB ROS                             Anesthesia Physical Anesthesia Plan  ASA:  III  Anesthesia Plan: Spinal   Post-op Pain Management:    Induction:   Airway Management Planned:   Additional Equipment:   Intra-op Plan:   Post-operative Plan:   Informed Consent: I have reviewed the patients History and Physical, chart, labs and discussed the procedure including the risks, benefits and alternatives for the proposed anesthesia with the patient or authorized representative who has indicated his/her understanding and acceptance.   Dental Advisory Given  Plan Discussed with: Anesthesiologist, CRNA and Surgeon  Anesthesia Plan Comments:         Anesthesia Quick Evaluation

## 2015-11-23 NOTE — H&P (Signed)
Reviewed paper H+P, will be scanned into chart. No changes noted.  

## 2015-11-23 NOTE — Progress Notes (Signed)
Dr Rosey Bath notified of fingerstick bloodsugar of 179 per [facility]. No new orders given.

## 2015-11-23 NOTE — Progress Notes (Signed)
Dr. Rudene Christians in to mark patient, patient still has no urine Advised needing uds, patient attempting urine Specimen again.

## 2015-11-23 NOTE — Anesthesia Procedure Notes (Signed)
Spinal  Start time: 11/23/2015 7:41 AM End time: 11/23/2015 7:57 AM Staffing Anesthesiologist: Martha Clan Resident/CRNA: BACHICHAnderson Malta Performed: resident/CRNA  Preanesthetic Checklist Completed: patient identified, site marked, surgical consent, pre-op evaluation, IV checked, risks and benefits discussed and monitors and equipment checked Spinal Block Patient position: sitting Prep: ChloraPrep Patient monitoring: heart rate, continuous pulse ox and blood pressure Approach: midline Location: L3-4 Injection technique: single-shot Needle Needle type: Whitacre  Needle gauge: 25 G

## 2015-11-23 NOTE — Progress Notes (Signed)
AP/lateral xray right knee done.

## 2015-11-23 NOTE — Progress Notes (Signed)
Diet ginger-ale given to drink. Tolerated well.

## 2015-11-23 NOTE — Op Note (Signed)
11/23/2015  9:58 AM  PATIENT:  Wanda Gardner  75 y.o. female  PRE-OPERATIVE DIAGNOSIS:  primary osteoarthritis, right knee  POST-OPERATIVE DIAGNOSIS:  primary osteoarthritis, right knee  PROCEDURE:  Procedure(s): TOTAL KNEE ARTHROPLASTY (Right)  SURGEON: Laurene Footman, MD  ASSISTANTS: Rachelle Hora [facility]  ANESTHESIA:   spinal  EBL:  Total I/O In: 1000 [I.V.:1000] Out: 175 [Urine:125; Blood:50]  BLOOD ADMINISTERED:none  DRAINS: none   LOCAL MEDICATIONS USED:  MARCAINE    and OTHER morphine and Exparel  SPECIMEN:  No Specimen  DISPOSITION OF SPECIMEN:  N/A  COUNTS:  YES  TOURNIQUET:   41 and then 25 minutes at 300 mmHg, tourniquet let down for injection and hemostasis  IMPLANTS: Medacta GMK right PS 5 right 5 GMK tibia with 11 x 30 stem and 5, 10 mm PS insert with set screw and 2 patella, all components cemented  DICTATION: .Dragon Dictation patient was brought to the operating room and after adequate spinal anesthesia was obtained the right leg was prepped and draped in sterile fashion. After patient identification and timeout procedure, tourniquet was raised and midline skin incision was made followed by medial parapatellar arthrotomy. Fat pad and anterior cruciate ligament and PCL excised. Inspection revealed significant arthrosis of the lateral compartment but poor. She cartilage throughout the knee with osteophytes throughout. Moderate synovitis present. The proximal tibia was exposed for application Medacta cutting guide with anterior horns excised from the menisci. Proximal tibia cut was carried out and the tibia bone removed. Next the distal femur was prepared in a similar fashion and distal femoral cut carried out. The size 5 cutting block was applied anterior posterior and chamfer cuts made. At this point the posterior horns of the menisci could be excised along some residual bone. Trial components were placed with a 5 on the tibia and size 5 on the femur 10 mm it was  somewhat tight and a piecrust technique used to next the IT band and lateral collateral ligament. With this the 10 mm insert was stable with the stability in flexion and extension. At this point the trial had been prepared with distal femoral drill holes and proximal tibial preparation with reaming and keel punch the trochlear groove cut was also carried out. All trials were removed and the patella cut with the patellar cutting guide. It sized to a size 2 after drilling holes were made. This point with all trials removed the tourniquet was let down hemostasis checked electrocautery. The injection of the above medication was then carried out with a tourniquet down and then the tourniquet raised and the bony surfaces thoroughly irrigated and dried. The components were cemented into place with the knee held in extension until cement had set there is a dilute Betadine irrigation of the joint and then removal of excess cement. The patella tracked somewhat laterally with a valgus knee and a lateral release was carried out. Following this patella tracked well. Tourniquet was let down and there was no significant bleeding with cauterization of the lateral released area. The joint was then thoroughly irrigated with pulse lavage and closed with a heavy Quill to a Quill substantially and skin staples. Xeroform 4 x 4's ABDs and web roll, Polar Care and Ace wrap applied  PLAN OF CARE: Admit to inpatient   PATIENT DISPOSITION:  PACU - hemodynamically stable.

## 2015-11-23 NOTE — Progress Notes (Signed)
Pt. To pre op via wheelchair, states she had went To the wrong building.  Advised we needed a urine Specimen, states she went before she left home. Advised pt. Need urine, ordered by MD for surgery.

## 2015-11-23 NOTE — Progress Notes (Signed)
Dr Rosey Bath at bedside to check pt. Cleared for d/c to room.

## 2015-11-23 NOTE — Progress Notes (Signed)
Attempted to contact patient, number given goes To a spanish speaking message machine.

## 2015-11-23 NOTE — NC FL2 (Signed)
[place] LEVEL OF CARE SCREENING TOOL     IDENTIFICATION  Patient Name: Wanda Gardner Birthdate: Apr 11, 1940 Sex: female Admission Date (Current Location): 11/23/2015  [facility] and [place] Number:  Selena Lesser  ([id number]) Facility and Address:  [facility], [address], [place], [place] [address]      Provider Number: [id number]  Attending Physician Name and Address:  Hessie Knows, MD  Relative Name and Phone Number:       Current Level of Care: Hospital Recommended Level of Care: [place] Prior Approval Number:    Date Approved/Denied:   PASRR Number:  ([id number])  Discharge Plan: SNF    Current Diagnoses: Patient Active Problem List   Diagnosis Date Noted  . Primary localized osteoarthritis of right knee 11/23/2015  . Stress incontinence, female 08/19/2014  . Cephalalgia 07/25/2011  . Central retinal vein occlusion 03/31/2011  . Dry eye syndrome 03/31/2011  . Glaucoma associated with vascular disorder 05/23/2010  . 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 Artificial lens present 03/22/2010  . Chronic glaucoma 03/22/2010  . Cataract, nuclear sclerotic senile 03/22/2010  . Diabetes mellitus type 2, uncontrolled ([place]) 07/26/2007  . Encounter for general adult medical examination without abnormal findings 12/23/2003  . Polypharmacy 02/03/2003   Diabetes mellitus type 2, uncomplicated (CMS-HCC)    Hypertension    Hyperlipidemia, unspecified    Osteoarthritis    Sleep apnea    Reflux esophagitis       Orientation RESPIRATION BLADDER Height & Weight     Self, Time, Situation, Place  Normal External catheter Weight:   Height:     BEHAVIORAL SYMPTOMS/MOOD NEUROLOGICAL BOWEL NUTRITION STATUS   (None. )  (None. ) Continent Diet (Regular Diet )  AMBULATORY STATUS COMMUNICATION OF NEEDS Skin   Extensive Assist Verbally Surgical wounds (Incision: Right Knee)                       Personal Care Assistance Level of Assistance  Bathing, Feeding, Dressing Bathing Assistance: Limited assistance Feeding assistance: Independent Dressing Assistance: Limited assistance     Functional Limitations Info  Sight, Hearing, Speech Sight Info: Adequate Hearing Info: Adequate Speech Info: Adequate    SPECIAL CARE FACTORS FREQUENCY  PT (By licensed PT), OT (By licensed OT)     PT Frequency:  (5) OT Frequency:  (5)            Contractures      Additional Factors Info  Code Status, Allergies, Insulin Sliding Scale Code Status Info:  (Full Code) Allergies Info:  (No Known Allergies )   Insulin Sliding Scale Info:  (Novolog, Lantus )       Current Medications (11/23/2015):  This is the current hospital active medication list Current Facility-Administered Medications  Medication Dose Route Frequency Provider Last Rate Last Dose  . 0.9 %  sodium chloride infusion   Intravenous Continuous Hessie Knows, MD      . acetaminophen (TYLENOL) tablet 650 mg  650 mg Oral Q6H PRN Hessie Knows, MD       Or  . acetaminophen (TYLENOL) suppository 650 mg  650 mg Rectal Q6H PRN Hessie Knows, MD      . albuterol (PROVENTIL) (2.5 MG/3ML) 0.083% nebulizer solution 3 mL  3 mL Inhalation Q6H PRN Hessie Knows, MD      . bisacodyl (DULCOLAX) suppository 10 mg  10 mg Rectal Daily PRN Hessie Knows, MD      . ceFAZolin (ANCEF)  IVPB 2g/100 mL premix  2 g Intravenous Q6H Hessie Knows, MD      . diazepam (VALIUM) tablet 2 mg  2 mg Oral Q8H PRN Hessie Knows, MD      . diphenhydrAMINE (BENADRYL) 12.5 MG/5ML elixir 12.5-25 mg  12.5-25 mg Oral Q4H PRN Hessie Knows, MD      . docusate sodium (COLACE) capsule 100 mg  100 mg Oral BID Hessie Knows, MD      . Derrill Memo ON 11/24/2015] enoxaparin (LOVENOX) injection 30 mg  30 mg Subcutaneous Q12H Hessie Knows, MD      . ferrous [id number] C-folic acid (TRINSICON / FOLTRIN) capsule 1 capsule  1 capsule Oral TID PC Hessie Knows, MD       . fluticasone (VERAMYST) nasal spray 2 spray  2 spray Nasal Daily Hessie Knows, MD      . furosemide (LASIX) tablet 20 mg  20 mg Oral Daily Hessie Knows, MD      . hydrochlorothiazide (HYDRODIURIL) tablet 25 mg  25 mg Oral Daily Hessie Knows, MD      . insulin aspart (novoLOG) injection 0-15 Units  0-15 Units Subcutaneous TID WC Hessie Knows, MD      . insulin aspart (novoLOG) injection 0-5 Units  0-5 Units Subcutaneous QHS Hessie Knows, MD      . insulin glargine (LANTUS) injection 40 Units  40 Units Subcutaneous QHS Hessie Knows, MD      . losartan (COZAAR) tablet 50 mg  50 mg Oral Daily Hessie Knows, MD      . magnesium citrate solution 1 Bottle  1 Bottle Oral Once PRN Hessie Knows, MD      . magnesium hydroxide (MILK OF MAGNESIA) suspension 30 mL  30 mL Oral Daily PRN Hessie Knows, MD      . menthol-cetylpyridinium (CEPACOL) lozenge 3 mg  1 lozenge Oral PRN Hessie Knows, MD       Or  . phenol (CHLORASEPTIC) mouth spray 1 spray  1 spray Mouth/Throat PRN Hessie Knows, MD      . metFORMIN (GLUCOPHAGE) tablet 500 mg  500 mg Oral BID WC Hessie Knows, MD      . methocarbamol (ROBAXIN) tablet 500 mg  500 mg Oral Q6H PRN Hessie Knows, MD       Or  . methocarbamol (ROBAXIN) 500 mg in dextrose 5 % 50 mL IVPB  500 mg Intravenous Q6H PRN Hessie Knows, MD      . metoCLOPramide (REGLAN) tablet 5-10 mg  5-10 mg Oral Q8H PRN Hessie Knows, MD       Or  . metoCLOPramide (REGLAN) injection 5-10 mg  5-10 mg Intravenous Q8H PRN Hessie Knows, MD      . morphine 2 MG/ML injection 2 mg  2 mg Intravenous Q1H PRN Hessie Knows, MD      . ondansetron [facility]) tablet 4 mg  4 mg Oral Q6H PRN Hessie Knows, MD       Or  . ondansetron [facility]) injection 4 mg  4 mg Intravenous Q6H PRN Hessie Knows, MD      . oxyCODONE (Oxy IR/ROXICODONE) immediate release tablet 5-10 mg  5-10 mg Oral Q3H PRN Hessie Knows, MD      . pantoprazole (PROTONIX) EC tablet 80 mg  80 mg Oral Q1200 Hessie Knows, MD      . potassium chloride SA  (K-DUR,KLOR-CON) CR tablet 20 mEq  20 mEq Oral Daily Hessie Knows, MD      . pravastatin (PRAVACHOL) tablet 20 mg  20  mg Oral QHS Hessie Knows, MD      . pregabalin (LYRICA) capsule 225 mg  225 mg Oral BID Hessie Knows, MD      . vitamin B-12 (CYANOCOBALAMIN) tablet 1,000 mcg  1,000 mcg Oral Daily Hessie Knows, MD      . Vitamin D3 CAPS 1,000 Units  1,000 Units Oral q morning - 10a Hessie Knows, MD      . zolpidem [facility]) tablet 5 mg  5 mg Oral QHS PRN Hessie Knows, MD         Discharge Medications: Please see discharge summary for a list of discharge medications.  Relevant Imaging Results:  Relevant Lab Results:   Additional Information  (SSN: [id number])  Danie Chandler, Student-Social Work

## 2015-11-23 NOTE — Transfer of Care (Signed)
Immediate Anesthesia Transfer of Care Note  Patient: Wanda Gardner  Procedure(s) Performed: Procedure(s): TOTAL KNEE ARTHROPLASTY (Right)  Patient Location: PACU  Anesthesia Type:Spinal  Level of Consciousness: awake, alert  and oriented  Airway & Oxygen Therapy: Patient connected to face mask oxygen  Post-op Assessment: Post -op Vital signs reviewed and stable  Post vital signs: stable  Last Vitals:  Vitals:   11/23/15 0642 11/23/15 0959  BP: (!) 141/57 (!) 111/51  Pulse: 80 82  Resp: 20 15  Temp: 36.4 C 37.4 C    Last Pain:  Vitals:   11/23/15 0959  TempSrc: Temporal  PainSc:          Complications: No apparent anesthesia complications

## 2015-11-23 NOTE — Progress Notes (Signed)
Pt. Hurting in knee 9 out of 10 after pain med. Dilaudid 0.5mg  IV ordered once

## 2015-11-24 LAB — BASIC METABOLIC PANEL
ANION GAP: 4 — AB (ref 5–15)
BUN: 14 mg/dL (ref 6–20)
CHLORIDE: 105 mmol/L (ref 101–111)
CO2: 30 mmol/L (ref 22–32)
Calcium: 8.8 mg/dL — ABNORMAL LOW (ref 8.9–10.3)
Creatinine, Ser: 0.87 mg/dL (ref 0.44–1.00)
GFR calc Af Amer: >60 mL/min (ref >60)
GFR calc non Af Amer: >60 mL/min (ref >60)
GLUCOSE: 152 mg/dL — AB (ref 65–99)
POTASSIUM: 4.3 mmol/L (ref 3.5–5.1)
Sodium: 139 mmol/L (ref 135–145)

## 2015-11-24 LAB — CBC
HEMATOCRIT: 37.6 % (ref 35.0–47.0)
HEMOGLOBIN: 13 g/dL (ref 12.0–16.0)
MCH: 29.6 pg (ref 26.0–34.0)
MCHC: 34.5 g/dL (ref 32.0–36.0)
MCV: 85.9 fL (ref 80.0–100.0)
Platelets: 265 10*3/uL (ref 150–440)
RBC: 4.38 MIL/uL (ref 3.80–5.20)
RDW: 14.8 % — ABNORMAL HIGH (ref 11.5–14.5)
WBC: 8.7 10*3/uL (ref 3.6–11.0)

## 2015-11-24 LAB — GLUCOSE, CAPILLARY
GLUCOSE-CAPILLARY: 159 mg/dL — AB (ref 65–99)
GLUCOSE-CAPILLARY: 221 mg/dL — AB (ref 65–99)
Glucose-Capillary: 164 mg/dL — ABNORMAL HIGH (ref 65–99)
Glucose-Capillary: 174 mg/dL — ABNORMAL HIGH (ref 65–99)

## 2015-11-24 NOTE — Progress Notes (Signed)
Clinical Social Worker (CSW) received SNF consult. PT is recommending home health. RN case manager aware of above. Please reconsult if future social work needs arise. CSW signing off.   Abryanna Musolino, LCSW [phone] 

## 2015-11-24 NOTE — Progress Notes (Signed)
Physical Therapy Treatment Patient Details Name: Wanda Gardner MRN: [id number] DOB: 07/23/1940 Today's Date: 11/24/2015    History of Present Illness Pt. is a 75 y.o female who was admitted for a right TKR.    PT Comments    Agrees to ambulation then requests to return to bed.  Stood and was able to ambulate 30' with min assist and a very irregular gait patterns this session even with constant verbal cues.  Pt required verbal and tactile cues and continues to move walker/feet in an irregular pattern, kept getting walker too close and generally unsteady often wavering to the right and seemed unaware.  Wheelchair follow was needed.  She declined further exercises once back in bed due to fatigue.    Pt did much better in am session with overall gait and safety.  Will continue at this time with initial recommendation for HHPT but will discuss with SWS for possible need for SNF if gait does not improve as anticipated.  She is impulsive at times and is very independent and is resistant to assist at times.   Follow Up Recommendations  Home health PT;Supervision for mobility/OOB     Equipment Recommendations  Rolling walker with 5" wheels    Recommendations for Other Services       Precautions / Restrictions Precautions Precautions: Knee Restrictions Weight Bearing Restrictions: Yes RLE Weight Bearing: Weight bearing as tolerated    Mobility  Bed Mobility Overal bed mobility: Modified Independent             General bed mobility comments: use of rails but able to do without assist  Transfers Overall transfer level: Needs assistance Equipment used: Rolling walker (2 wheeled) Transfers: Sit to/from Stand Sit to Stand: Min assist;+2 safety/equipment         General transfer comment: impulsivity remains this pm  Ambulation/Gait Ambulation/Gait assistance: Min assist (wheel chair follow for safety) Ambulation Distance (Feet): 30 Feet Assistive device: Rolling walker (2  wheeled) Gait Pattern/deviations: Step-to pattern (irregular)   Gait velocity interpretation: <1.8 ft/sec, indicative of risk for recurrent falls General Gait Details: KI   Financial trader Rankin (Stroke Patients Only)       Balance Overall balance assessment: Needs assistance Sitting-balance support: Feet supported Sitting balance-Leahy Scale: Good     Standing balance support: Bilateral upper extremity supported Standing balance-Leahy Scale: Fair                      Cognition Arousal/Alertness: Awake/alert Behavior During Therapy: WFL for tasks assessed/performed Overall Cognitive Status: Within Functional Limits for tasks assessed                      Exercises Total Joint Exercises Ankle Circles/Pumps: AROM;10 reps Quad Sets: Strengthening;10 reps Gluteal Sets: Strengthening;10 reps Short Arc Quad: AROM;Right;10 reps;Supine Heel Slides: AROM;10 reps Hip ABduction/ADduction: Strengthening;10 reps Straight Leg Raises: AROM;10 reps Long Arc Quad: AROM;Left;10 reps;Seated Knee Flexion: PROM;5 reps Goniometric ROM: 10-85    General Comments        Pertinent Vitals/Pain Pain Assessment: 0-10 Pain Score: 6  Pain Location: r knee Pain Descriptors / Indicators: Operative site guarding;Aching;Sore Pain Intervention(s): Monitored during session;Ice applied    Home Living Family/patient expects to be discharged to:: Private residence Living Arrangements: Children Available Help at Discharge: Family   Home Access: Stairs to enter Entrance Stairs-Rails: Left Home Layout: One level Home  Equipment: Gilford Rile - 2 wheels;Cane - single point      Prior Function Level of Independence: Independent      Comments: Pt would use cane outside the home, used furniture/walls inside   PT Goals (current goals can now be found in the care plan section) Acute Rehab PT Goals Patient Stated Goal: To return home.     Frequency    BID      PT Plan Current plan remains appropriate    Co-evaluation             End of Session Equipment Utilized During Treatment: Gait belt Activity Tolerance: Patient tolerated treatment well Patient left: in bed;with call bell/phone within reach;with bed alarm set     Time: 1150-1208 PT Time Calculation (min) (ACUTE ONLY): 18 min  Charges:  $Gait Training: 8-22 mins $Therapeutic Exercise: 8-22 mins                    G Codes:      Chesley Noon 11-30-2015, 12:11 PM

## 2015-11-24 NOTE — Progress Notes (Addendum)
   Subjective: 1 Day Post-Op Procedure(s) (LRB): TOTAL KNEE ARTHROPLASTY (Right) Patient reports pain as mild.   Patient is well, and has had no acute complaints or problems Denies any CP, SOB, ABD pain. We will start therapy today.    Objective: Vital signs in last 24 hours: Temp:  [97.5 F (36.4 C)-99.4 F (37.4 C)] 98.9 F (37.2 C) (10/04 0355) Pulse Rate:  [66-85] 66 (10/04 0355) Resp:  [12-19] 18 (10/04 0355) BP: (111-152)/(51-66) 137/66 (10/04 0355) SpO2:  [92 %-100 %] 100 % (10/04 0355) Weight:  [238 lb (108 kg)] 238 lb (108 kg) (10/03 1232)  Intake/Output from previous day: 10/03 0701 - 10/04 0700 In: 3647.5 [P.O.:1250; I.V.:2397.5] Out: [id number]; Blood:50] Intake/Output this shift: No intake/output data recorded.   Recent Labs  11/23/15 1210 11/24/15 0515  HGB 12.5 13.0    Recent Labs  11/23/15 1210 11/24/15 0515  WBC 5.7 8.7  RBC 4.23 4.38  HCT 36.0 37.6  PLT 248 265    Recent Labs  11/23/15 1210 11/24/15 0515  NA  --  139  K  --  4.3  CL  --  105  CO2  --  30  BUN  --  14  CREATININE 1.10* 0.87  GLUCOSE  --  152*  CALCIUM  --  8.8*   No results for input(s): LABPT, INR in the last 72 hours.  EXAM General - Patient is Alert, Appropriate and Oriented Extremity - Neurovascular intact Sensation intact distally Intact pulses distally Dorsiflexion/Plantar flexion intact No cellulitis present Compartment soft Dressing - dressing C/D/I and no drainage Motor Function - intact, moving foot and toes well on exam.   Past Medical History:  Diagnosis Date  . Acid reflux   . Arthritis   . Asthma   . COPD (chronic obstructive pulmonary disease) ([place])   . Depression   . Diabetes mellitus without complication ([place])   . Glaucoma   . Headache    right side of head  . Hyperlipidemia   . Hypertension   . Neuropathic pain of hands and feet   . PONV (postoperative nausea and vomiting) 1982   back with my hysterectomy.  . Sleep apnea      Assessment/Plan:   1 Day Post-Op Procedure(s) (LRB): TOTAL KNEE ARTHROPLASTY (Right) Active Problems:   Primary localized osteoarthritis of right knee  Estimated body mass index is 37.28 kg/m as calculated from the following:   Height as of this encounter: 5\' 7"  (1.702 m).   Weight as of this encounter: 238 lb (108 kg). Advance diet Up with therapy  Needs BM CM to assist with discharge Recheck labs in the am  DVT Prophylaxis - Lovenox, Foot Pumps and TED hose Weight-Bearing as tolerated to right leg   T. Rachelle Hora, PA-C [place] 11/24/2015, 7:50 AM

## 2015-11-24 NOTE — Progress Notes (Signed)
Pt. Got out of bed and demanded to walk to bathroom ignoring the nurse techs when she was told to sit down before she fell or injured her knee. Pt. Walked to bathroom almost falling several times. Pt. Is back to bed uninjured at this time. Pt. Moved to room 151 so she can be closely monitored.

## 2015-11-24 NOTE — Progress Notes (Signed)
Physical Therapy Treatment Patient Details Name: Wanda Gardner MRN: [id number] DOB: 05-21-1940 Today's Date: 11/24/2015    History of Present Illness 75 y/o female s/p R total knee replacement 10/3.    PT Comments    Pt in bed ready for session.  Participated in exercises as described below.  Pt was able to complete 10 good SLR's without assist.  She transitioned to edge of bed with rail and HOB slightly raised.  She was able to stand and take 3 steps but right knee buckled but she was able to control without assist.  Returned to bed where knee immobilizer was donned and she was able to ambulate 92' with walker and significantly improved gait quality.  Nursing noted in chart poor gait quality to bathroom earlier today.  She seems to benefit greatly from [facility] use at this time and primary nurse was made aware.  Pt reported feeling more comfortable also.  Upon sitting in chair pt did have one lob while reaching back and an uncontrolled sit into chair.  Discussed safety but she stated she lost her balance.     Follow Up Recommendations  Home health PT     Equipment Recommendations       Recommendations for Other Services       Precautions / Restrictions Precautions Precautions: Knee Restrictions Weight Bearing Restrictions: Yes RLE Weight Bearing: Weight bearing as tolerated    Mobility  Bed Mobility Overal bed mobility: Modified Independent             General bed mobility comments: Pt needing rails to get to sitting, but able to get legs off EOB and upright to sitting w/o direct assist  Transfers Overall transfer level: Needs assistance Equipment used: Rolling walker (2 wheeled) Transfers: Sit to/from Stand Sit to Stand: Min guard         General transfer comment: impulsive with poor hand plancements  Ambulation/Gait Ambulation/Gait assistance: Min guard Ambulation Distance (Feet): 40 Feet Assistive device: Rolling walker (2 wheeled) Gait Pattern/deviations:  Step-to pattern   Gait velocity interpretation: <1.8 ft/sec, indicative of risk for recurrent falls General Gait Details: buckling noted without knee immobilizer despite 10 good SLR's.  KI donned with improved gait   Stairs            Wheelchair Mobility    Modified Rankin (Stroke Patients Only)       Balance Overall balance assessment: Needs assistance Sitting-balance support: Feet supported Sitting balance-Leahy Scale: Good     Standing balance support: Bilateral upper extremity supported Standing balance-Leahy Scale: Fair                      Cognition Arousal/Alertness: Awake/alert Behavior During Therapy: WFL for tasks assessed/performed Overall Cognitive Status: Within Functional Limits for tasks assessed                      Exercises Total Joint Exercises Ankle Circles/Pumps: AROM;10 reps Quad Sets: Strengthening;10 reps Gluteal Sets: Strengthening;10 reps Short Arc Quad: AROM;Right;10 reps;Supine Heel Slides: AROM;10 reps Hip ABduction/ADduction: Strengthening;10 reps Straight Leg Raises: AROM;10 reps Long Arc Quad: AROM;Left;10 reps;Seated Knee Flexion: PROM;5 reps Goniometric ROM: 10-85    General Comments        Pertinent Vitals/Pain Pain Assessment: 0-10 Pain Score: 5  Pain Location: R knee Pain Descriptors / Indicators: Operative site guarding;Shooting Pain Intervention(s): Monitored during session;Limited activity within patient's tolerance;Ice applied    Home Living  Prior Function            PT Goals (current goals can now be found in the care plan section) Acute Rehab PT Goals Patient Stated Goal: get back to walking well    Frequency    BID      PT Plan Current plan remains appropriate    Co-evaluation             End of Session Equipment Utilized During Treatment: Gait belt Activity Tolerance: Patient tolerated treatment well Patient left: with call bell/phone  within reach;with chair alarm set     Time: [id number] PT Time Calculation (min) (ACUTE ONLY): 27 min  Charges:  $Gait Training: 8-22 mins $Therapeutic Exercise: 8-22 mins                    G Codes:      Chesley Noon 12/02/2015, 10:29 AM

## 2015-11-24 NOTE — Care Management (Addendum)
Spoke again with patient regarding home health PT recommendation. Patient admits that she has not looked at home health agency list but will and asked that I follow up with her tomorrow. Patient has rolling walker but requests a bedside commode which has been requested from [place] [facility]. Lovenox 40mg  #14 called in to  [facility] [phone] for price. Lovenox is covered by insurance at 100%. Patient cost $0.00. Bedside commode has been delivered. Patient has picked [name] for home health PT.

## 2015-11-24 NOTE — Care Management Note (Signed)
Case Management Note  Patient Details  Name: Wanda Gardner MRN: [id number] Date of Birth: 13-Apr-1940  Subjective/Objective:                  Met with patient to discuss discharge planning. Patient wants "to go to any SNF that will let me smoke". PT is not recommending SNF and at this time her insurance will not cover SNF- patient has been advised. PT pending today. She lives alone during the day; family at night. She has a rolling walker available for use at home. She uses [facility] [phone] for medication.  Action/Plan:   List of home health agencies left with patient for review. Lovenox 21m #14 will need to be called [facility] when determination is made.   Expected Discharge Date:                  Expected Discharge Plan:     In-House Referral:     Discharge planning Services  CM Consult  Post Acute Care Choice:  Home Health Choice offered to:  Patient  DME Arranged:    DME Agency:     HH Arranged:    [facility]     Status of Service:  In process, will continue to follow  If discussed at [name], dates discussed:    Additional Comments:  AMarshell Garfinkel RN 11/24/2015, 9:16 AM

## 2015-11-24 NOTE — Progress Notes (Signed)
Chaplain rounding visited patient in 1A following a nurse referral. Patient told chaplain she was in pain and would appreciate prayers and spiritual support to pull through the pain. Chaplain provided prayers for healing and presence.     11/24/15 1500  Clinical Encounter Type  Visited With Patient  Visit Type Spiritual support  Referral From Nurse  Spiritual Encounters  Spiritual Needs Prayer

## 2015-11-24 NOTE — Evaluation (Signed)
Occupational Therapy Evaluation Patient Details Name: Wanda Gardner MRN: [id number] DOB: 01/28/1941 Today's Date: 11/24/2015    History of Present Illness Pt. is a 75 y.o female who was admitted for a right TKR.   Clinical Impression   Pt. Is a 75 y.o. female who was admitted for a right TKR. Pt presents with limited ROM, pain, weakness, and impaired functional mobility which hinder his/her ability to complete ADL and IADL tasks. Pt. could benefit from skilled OT services to review A/E use for LE ADLs, to review necessary home modifications, and to improve functional mobility for ADL/IADLs in order to work towards regaining Independence with ADL/IADLs.     Follow Up Recommendations       Equipment Recommendations       Recommendations for Other Services       Precautions / Restrictions Precautions Precautions: Knee Restrictions Weight Bearing Restrictions: Yes RLE Weight Bearing: Weight bearing as tolerated              ADL Overall ADL's : Independent;Needs assistance/impaired Eating/Feeding: Set up   Grooming: Set up               Lower Body Dressing: Minimal assistance;Moderate assistance               Functional mobility during ADLs: MinA General ADL Comments: Pt. education was provided about A/E use for LE ADLs.     Vision     Perception     Praxis      Pertinent Vitals/Pain Pain Assessment: 0-10 Pain Score: 3  Pain Location: Right Knee Pain Descriptors / Indicators: Operative site guarding;Shooting Pain Intervention(s): Monitored during session;Limited activity within patient's tolerance     Hand Dominance Right   Extremity/Trunk Assessment Upper Extremity Assessment Upper Extremity Assessment: Overall WFL for tasks assessed           Communication Communication Communication: No difficulties   Cognition Arousal/Alertness: Awake/alert Behavior During Therapy: WFL for tasks assessed/performed Overall Cognitive Status:  Within Functional Limits for tasks assessed                     General Comments       Exercises Exercises: Total Joint     Shoulder Instructions      Home Living Family/patient expects to be discharged to:: Private residence Living Arrangements: Children Available Help at Discharge: Family   Home Access: Stairs to enter   Entrance Stairs-Rails: Left Home Layout: One level     Bathroom Shower/Tub: Tub/shower unit;Curtain Shower/tub characteristics: Architectural technologist: Standard     Home Equipment: Environmental consultant - 2 wheels;Cane - single point          Prior Functioning/Environment Level of Independence: Independent        Comments: Pt would use cane outside the home, used furniture/walls inside        OT Problem List:     OT Treatment/Interventions:      OT Goals(Current goals can be found in the care plan section) Acute Rehab OT Goals Patient Stated Goal: To return home. OT Goal Formulation: With patient Potential to Achieve Goals: Good  OT Frequency:     Barriers to D/C:            Co-evaluation              End of Session    Activity Tolerance:  WFL of tasks performed Patient left:  In chair with call bell in place, chair alarm set.  Time: [id number] OT Time Calculation (min): 30 min Charges:  OT General Charges $OT Visit: 1 Procedure OT Evaluation $OT Eval Moderate Complexity: 1 Procedure OT Treatments $Self Care/Home Management : 8-22 mins G-Codes:    Harrel Carina, MS, OTR/L 11/24/2015, 10:59 AM

## 2015-11-24 NOTE — Anesthesia Postprocedure Evaluation (Signed)
Anesthesia Post Note  Patient: Wanda Gardner  Procedure(s) Performed: Procedure(s) (LRB): TOTAL KNEE ARTHROPLASTY (Right)  Patient location during evaluation: Nursing Unit Anesthesia Type: Spinal Level of consciousness: awake and alert and patient cooperative Pain management: satisfactory to patient Vital Signs Assessment: post-procedure vital signs reviewed and stable Respiratory status: spontaneous breathing, respiratory function stable and nonlabored ventilation Cardiovascular status: stable Postop Assessment: no headache, no signs of nausea or vomiting, adequate PO intake and patient able to bend at knees Anesthetic complications: no    Last Vitals:  Vitals:   11/23/15 2353 11/24/15 0355  BP: (!) 152/65 137/66  Pulse: 68 66  Resp: 18 18  Temp: 37.1 C 37.2 C    Last Pain:  Vitals:   11/24/15 0709  TempSrc:   PainSc: 0-No pain    LLE Motor Response: Purposeful movement (11/24/15 0709) LLE Sensation: Full sensation (11/24/15 0709) RLE Motor Response: Purposeful movement (11/24/15 0709) RLE Sensation: Full sensation (11/24/15 0709)      Silvana Newness A

## 2015-11-25 LAB — GLUCOSE, CAPILLARY
GLUCOSE-CAPILLARY: 266 mg/dL — AB (ref 65–99)
GLUCOSE-CAPILLARY: 278 mg/dL — AB (ref 65–99)
Glucose-Capillary: 197 mg/dL — ABNORMAL HIGH (ref 65–99)
Glucose-Capillary: 174 mg/dL — ABNORMAL HIGH (ref 65–99)

## 2015-11-25 LAB — BASIC METABOLIC PANEL
ANION GAP: 7 (ref 5–15)
BUN: 15 mg/dL (ref 6–20)
CO2: 28 mmol/L (ref 22–32)
Calcium: 8.9 mg/dL (ref 8.9–10.3)
Chloride: 101 mmol/L (ref 101–111)
Creatinine, Ser: 1.01 mg/dL — ABNORMAL HIGH (ref 0.44–1.00)
GFR, EST NON AFRICAN AMERICAN: 54 mL/min — AB (ref >60)
GLUCOSE: 205 mg/dL — AB (ref 65–99)
POTASSIUM: 4.5 mmol/L (ref 3.5–5.1)
Sodium: 136 mmol/L (ref 135–145)

## 2015-11-25 LAB — CBC
HEMATOCRIT: 35.2 % (ref 35.0–47.0)
Hemoglobin: 11.9 g/dL — ABNORMAL LOW (ref 12.0–16.0)
MCH: 28.3 pg (ref 26.0–34.0)
MCHC: 33.7 g/dL (ref 32.0–36.0)
MCV: 84.1 fL (ref 80.0–100.0)
PLATELETS: 230 10*3/uL (ref 150–440)
RBC: 4.19 MIL/uL (ref 3.80–5.20)
RDW: 14.6 % — ABNORMAL HIGH (ref 11.5–14.5)
WBC: 8.3 10*3/uL (ref 3.6–11.0)

## 2015-11-25 NOTE — Progress Notes (Signed)
Physical Therapy Treatment Patient Details Name: Wanda Gardner MRN: [id number] DOB: May 08, 1940 Today's Date: 11/25/2015    History of Present Illness Pt. is a 75 y.o female who was admitted for a right TKR.    PT Comments    Pt agreeable to PT, but only bed exercises. Pt with increased pain in right knee this afternoon. Pt tolerates Bilateral lower extremities exercises with assist to Right lower extremity; pain noted with exercises. Encouraged breathing versus holding breath and clenching, as pt does with exertion. Encouraged continued quad set in with ankle roll/foam boot. Continue PT to progress range, strength and activity tolerance to improve all functional mobility. Continue home recommendation if progression of ambulation and demonstrates ability to perform steps.   Follow Up Recommendations  Home health PT;Supervision for mobility/OOB pending progress with ambulation and stair climbing.     Equipment Recommendations  Rolling walker with 5" wheels    Recommendations for Other Services       Precautions / Restrictions Precautions Precautions: Knee Restrictions Weight Bearing Restrictions: Yes RLE Weight Bearing: Weight bearing as tolerated    Mobility  Bed Mobility Overal bed mobility: Modified Independent             General bed mobility comments: Refused out of bed at this time  Transfers Overall transfer level: Needs assistance Equipment used: Rolling walker (2 wheeled) Transfers: Sit to/from Stand Sit to Stand: Min assist         General transfer comment: Cues for hand placment; increased time to attain full upright   Ambulation/Gait Ambulation/Gait assistance: Min guard Ambulation Distance (Feet): 40 Feet Assistive device: Rolling walker (2 wheeled) Gait Pattern/deviations: Step-to pattern;Step-through pattern;Decreased step length - left;Decreased stance time - right;Decreased weight shift to right;Antalgic;Trunk flexed Gait velocity:  decreased Gait velocity interpretation: <1.8 ft/sec, indicative of risk for recurrent falls General Gait Details: Requires consistent cues for sequence. Step to to partial step through. Cues for QS with R stance phase   Stairs            Wheelchair Mobility    Modified Rankin (Stroke Patients Only)       Balance Overall balance assessment: Needs assistance Sitting-balance support: Feet supported Sitting balance-Leahy Scale: Good     Standing balance support: Bilateral upper extremity supported Standing balance-Leahy Scale: Fair                      Cognition Arousal/Alertness: Awake/alert Behavior During Therapy: WFL for tasks assessed/performed Overall Cognitive Status: Within Functional Limits for tasks assessed                      Exercises Total Joint Exercises Ankle Circles/Pumps: AROM;Both;20 reps;Supine Quad Sets: Strengthening;Both;20 reps;Supine (long sit and in stand) Gluteal Sets: Strengthening;20 reps;Both;Supine Short Arc Quad: AAROM;Right;20 reps;Supine (L AROM) Heel Slides: AAROM;Right;20 reps;Supine Hip ABduction/ADduction: AAROM;Both;20 reps;Supine Straight Leg Raises: AAROM;10 reps;Supine;Both Long Arc Quad: AROM;AAROM;Right;10 reps;Seated Knee Flexion: AROM;Right;10 reps;Seated (3 positions each repetition with 10 second hold) Goniometric ROM: 2 to 82  Marching in Standing: AROM;Right;10 reps;Standing    General Comments        Pertinent Vitals/Pain Pain Assessment: 0-10 Pain Score: 8  Pain Location: R knee Pain Descriptors / Indicators: Operative site guarding;Aching;Constant Pain Intervention(s): Premedicated before session;Monitored during session;Limited activity within patient's tolerance;Ice applied    Home Living                      Prior Function  PT Goals (current goals can now be found in the care plan section) Progress towards PT goals: Progressing toward goals (slowly)     Frequency    BID      PT Plan Current plan remains appropriate    Co-evaluation             End of Session Equipment Utilized During Treatment: Gait belt Activity Tolerance: Patient limited by pain;Patient limited by fatigue Patient left: in bed;with call bell/phone within reach;with nursing/sitter in room;Other (comment) (polar care in place)     Time: [id number] PT Time Calculation (min) (ACUTE ONLY): 24 min  Charges:  $Gait Training: 8-22 mins $Therapeutic Exercise: 23-37 mins                    G CodesLarae Grooms, PTA 11/25/2015, 3:08 PM

## 2015-11-25 NOTE — Progress Notes (Signed)
Physical Therapy Treatment Patient Details Name: Wanda Gardner MRN: [id number] DOB: May 28, 1940 Today's Date: 11/25/2015    History of Present Illness Pt. is a 74 y.o female who was admitted for a right TKR.    PT Comments    Pt agreeable to PT. Pt notes 7/10 pain in right knee; pt in good spirits talking/laughing throughout session. Able to perform bed mobility while managing a phone conversation with phone in hand with Mod I. Participates in seated exercises/stretching demonstrating good active range of motion in right knee. Requires assist for stand with cues for safety. Performed right march and weight shift right with quad set in preparation for ambulation.  Pt progressing ambulation; however, needs consistent cueing for sequence and improved technique for steps. Pt received up in chair comfortably; encouraged quad sets on ankle roll. Plan to see pt this afternoon for continued progress on range, strength and endurance to improve all functional mobility.   Follow Up Recommendations  Home health PT;Supervision for mobility/OOB     Equipment Recommendations  Rolling walker with 5" wheels    Recommendations for Other Services       Precautions / Restrictions Precautions Precautions: Knee Restrictions Weight Bearing Restrictions: Yes RLE Weight Bearing: Weight bearing as tolerated    Mobility  Bed Mobility Overal bed mobility: Modified Independent             General bed mobility comments: slow increased time/effort, but also on telephone as she transitions to sit  Transfers Overall transfer level: Needs assistance Equipment used: Rolling walker (2 wheeled) Transfers: Sit to/from Stand Sit to Stand: Min assist         General transfer comment: Cues for hand placment; increased time to attain full upright   Ambulation/Gait Ambulation/Gait assistance: Min guard Ambulation Distance (Feet): 40 Feet Assistive device: Rolling walker (2 wheeled) Gait  Pattern/deviations: Step-to pattern;Step-through pattern;Decreased step length - left;Decreased stance time - right;Decreased weight shift to right;Antalgic;Trunk flexed Gait velocity: decreased Gait velocity interpretation: <1.8 ft/sec, indicative of risk for recurrent falls General Gait Details: Requires consistent cues for sequence. Step to to partial step through. Cues for QS with R stance phase   Stairs            Wheelchair Mobility    Modified Rankin (Stroke Patients Only)       Balance Overall balance assessment: Needs assistance Sitting-balance support: Feet supported Sitting balance-Leahy Scale: Good     Standing balance support: Bilateral upper extremity supported Standing balance-Leahy Scale: Fair                      Cognition Arousal/Alertness: Awake/alert Behavior During Therapy: WFL for tasks assessed/performed Overall Cognitive Status: Within Functional Limits for tasks assessed                      Exercises Total Joint Exercises Quad Sets: Strengthening;Both;10 reps (long sit and in stand) Long Arc Quad: AROM;AAROM;Right;10 reps;Seated Knee Flexion: AROM;Right;10 reps;Seated (3 positions each repetition with 10 second hold) Goniometric ROM: 2 to 82  Marching in Standing: AROM;Right;10 reps;Standing    General Comments        Pertinent Vitals/Pain Pain Assessment: 0-10 Pain Score: 7  Pain Location: R knee Pain Descriptors / Indicators: Operative site guarding;Aching;Constant Pain Intervention(s): Limited activity within patient's tolerance;Monitored during session;Premedicated before session;Repositioned;Ice applied    Home Living  Prior Function            PT Goals (current goals can now be found in the care plan section) Progress towards PT goals: Progressing toward goals    Frequency    BID      PT Plan Current plan remains appropriate    Co-evaluation             End of  Session Equipment Utilized During Treatment: Gait belt Activity Tolerance: Patient tolerated treatment well Patient left: in chair;with call bell/phone within reach;with chair alarm set;with family/visitor present;Other (comment) (polar care in place)     Time: [id number] PT Time Calculation (min) (ACUTE ONLY): 33 min  Charges:  $Gait Training: 8-22 mins $Therapeutic Exercise: 8-22 mins                    G Codes:      Larae Grooms, PTA 11/25/2015, 2:05 PM

## 2015-11-25 NOTE — Progress Notes (Signed)
   Subjective: 2 Days Post-Op Procedure(s) (LRB): TOTAL KNEE ARTHROPLASTY (Right) Patient reports pain as mild.   Patient is well, and has had no acute complaints or problems Denies any CP, SOB, ABD pain. We will continue therapy today.    Objective: Vital signs in last 24 hours: Temp:  [98.8 F (37.1 C)-99.8 F (37.7 C)] 98.9 F (37.2 C) (10/05 0447) Pulse Rate:  [65-74] 74 (10/05 0447) Resp:  [16-20] 18 (10/05 0447) BP: (112-146)/(50-61) 145/57 (10/05 0447) SpO2:  [96 %-100 %] 98 % (10/05 0447)  Intake/Output from previous day: 10/04 0701 - 10/05 0700 In: 647.5 [P.O.:240; I.V.:407.5] Out: 1650 [Urine:1650] Intake/Output this shift: No intake/output data recorded.   Recent Labs  11/23/15 1210 11/24/15 0515 11/25/15 0425  HGB 12.5 13.0 11.9*    Recent Labs  11/24/15 0515 11/25/15 0425  WBC 8.7 8.3  RBC 4.38 4.19  HCT 37.6 35.2  PLT 265 230    Recent Labs  11/24/15 0515 11/25/15 0425  NA 139 136  K 4.3 4.5  CL 105 101  CO2 30 28  BUN 14 15  CREATININE 0.87 1.01*  GLUCOSE 152* 205*  CALCIUM 8.8* 8.9   No results for input(s): LABPT, INR in the last 72 hours.  EXAM General - Patient is Alert, Appropriate and Oriented Extremity - Neurovascular intact Sensation intact distally Intact pulses distally Dorsiflexion/Plantar flexion intact No cellulitis present Compartment soft Dressing - dressing C/D/I and no drainage, dressing changed Motor Function - intact, moving foot and toes well on exam.   Past Medical History:  Diagnosis Date  . Acid reflux   . Arthritis   . Asthma   . COPD (chronic obstructive pulmonary disease) ([place])   . Depression   . Diabetes mellitus without complication ([place])   . Glaucoma   . Headache    right side of head  . Hyperlipidemia   . Hypertension   . Neuropathic pain of hands and feet   . PONV (postoperative nausea and vomiting) 1982   back with my hysterectomy.  . Sleep apnea     Assessment/Plan:   2 Days  Post-Op Procedure(s) (LRB): TOTAL KNEE ARTHROPLASTY (Right) Active Problems:   Primary localized osteoarthritis of right knee  Estimated body mass index is 37.28 kg/m as calculated from the following:   Height as of this encounter: 5\' 7"  (1.702 m).   Weight as of this encounter: 108 kg (238 lb). Advance diet Up with therapy  Needs BM CM to assist with discharge Plan on discharge to SNF tomorrow, patient does not have adequate help at home.  DVT Prophylaxis - Lovenox, Foot Pumps and TED hose Weight-Bearing as tolerated to right leg   T. Rachelle Hora, PA-C [place] 11/25/2015, 7:58 AM

## 2015-11-25 NOTE — Progress Notes (Signed)
Chaplain visited to see how the patient was doing so as to provide more care. Patient told the chaplain she was doing much better and requested for prayers for healing. Chaplain offered the patient prayers and presence.     11/25/15 1500  Clinical Encounter Type  Visited With Patient  Visit Type Follow-up;Spiritual support  Spiritual Encounters  Spiritual Needs Prayer

## 2015-11-26 LAB — GLUCOSE, CAPILLARY
GLUCOSE-CAPILLARY: 285 mg/dL — AB (ref 65–99)
Glucose-Capillary: 190 mg/dL — ABNORMAL HIGH (ref 65–99)

## 2015-11-26 MED ORDER — FLEET ENEMA 7-19 GM/118ML RE ENEM: 1.0000 | ENEMA | Freq: Once | RECTAL | Status: DC

## 2015-11-26 MED ORDER — OXYCODONE HCL 5 MG PO TABS: 5.0000 mg | ORAL_TABLET | ORAL | 0 refills | Status: DC | PRN

## 2015-11-26 MED ORDER — ENOXAPARIN SODIUM 40 MG/0.4ML ~~LOC~~ SOLN: 40.0000 mg | SUBCUTANEOUS | 0 refills | Status: DC

## 2015-11-26 MED ORDER — DIAZEPAM 2 MG PO TABS: 2.0000 mg | ORAL_TABLET | Freq: Three times a day (TID) | ORAL | 0 refills | Status: DC | PRN

## 2015-11-26 MED ORDER — LACTULOSE 10 GM/15ML PO SOLN
10.0000 g | Freq: Two times a day (BID) | ORAL | Status: DC
  Administered 2015-11-26: 10 g via ORAL
  Filled 2015-11-26: qty 30

## 2015-11-26 NOTE — Progress Notes (Signed)
Physical Therapy Treatment Patient Details Name: MILLA MCPETERS MRN: [id number] DOB: 1941-01-10 Today's Date: 11/26/2015    History of Present Illness Pt. is a 75 y.o female who was admitted for a right TKR.    PT Comments    Ms. Luff continues to make slow progress with mobility, requiring max encouragement with transfers and ambulation.  She continues to require min assist to for sit<>stand and demonstrates unsafe stand>sit technique.  She remains a high fall risk and will not have assist during the day when her daughter is at work. She fatigues quickly, requiring 1 standing rest break to ambulate 60 ft.  Recommendation for SNF remains appropriate.   Follow Up Recommendations  SNF     Equipment Recommendations  Rolling walker with 5" wheels    Recommendations for Other Services       Precautions / Restrictions Precautions Precautions: Knee Restrictions Weight Bearing Restrictions: Yes RLE Weight Bearing: Weight bearing as tolerated    Mobility  Bed Mobility Overal bed mobility: Needs Assistance Bed Mobility: Supine to Sit     Supine to sit: Supervision     General bed mobility comments: Increased time and effort  Transfers Overall transfer level: Needs assistance Equipment used: Rolling walker (2 wheeled) Transfers: Sit to/from Stand Sit to Stand: Min assist         General transfer comment: Cues for hand placement and min assist to boost to standing.  When returning to bed to sit she sits prematurely despite cues to turn completely prior to sitting.  Pt educated on safe technique and pt verbalized understanding.  Ambulation/Gait Ambulation/Gait assistance: Min guard Ambulation Distance (Feet): 60 Feet Assistive device: Rolling walker (2 wheeled) Gait Pattern/deviations: Step-to pattern;Decreased stride length;Decreased weight shift to right;Antalgic;Trunk flexed Gait velocity: decreased Gait velocity interpretation: <1.8 ft/sec, indicative of risk for  recurrent falls General Gait Details: Requires mod verbal cues for upright posture, forward gaze, and relaxing shoulders.  She requires 1 standing rest break and ambulates at decreased speed, requiring close min guard assist.   Stairs            Wheelchair Mobility    Modified Rankin (Stroke Patients Only)       Balance Overall balance assessment: Needs assistance Sitting-balance support: No upper extremity supported;Feet supported Sitting balance-Leahy Scale: Good     Standing balance support: Bilateral upper extremity supported;During functional activity Standing balance-Leahy Scale: Poor Standing balance comment: Relies on RW for support                    Cognition Arousal/Alertness: Awake/alert Behavior During Therapy: WFL for tasks assessed/performed Overall Cognitive Status: Within Functional Limits for tasks assessed                      Exercises Total Joint Exercises Ankle Circles/Pumps: AROM;Both;Supine;10 reps Quad Sets: Strengthening;Both;Supine;5 reps;Other (comment) (with 5 second holds) Straight Leg Raises: AROM;Right;5 reps;Supine Long Arc Quad: AAROM;Right;10 reps;Seated;Other (comment);AROM;Strengthening (with emphasis on eccentric control) Knee Flexion: AAROM;Right;Seated;5 reps;Other (comment) (with 5 second holds) Goniometric ROM: ~90 deg R knee flexion    General Comments        Pertinent Vitals/Pain Pain Assessment: 0-10 Pain Score: 8  (11/10 R knee, 8/10 headache) Pain Location: headache, R knee Pain Descriptors / Indicators: Aching;Headache Pain Intervention(s): Limited activity within patient's tolerance;Monitored during session;Repositioned;Other (comment) (Polar care applied at end of session)    Home Living  Prior Function            PT Goals (current goals can now be found in the care plan section) Acute Rehab PT Goals Patient Stated Goal: rehab before home PT Goal Formulation:  With patient Time For Goal Achievement: 12/07/15 Potential to Achieve Goals: Good Progress towards PT goals: Progressing toward goals (modestly)    Frequency    BID      PT Plan Current plan remains appropriate    Co-evaluation             End of Session Equipment Utilized During Treatment: Gait belt Activity Tolerance: Patient limited by pain;Patient limited by fatigue Patient left: with call bell/phone within reach;Other (comment);in bed;with bed alarm set (polar care in place and in knee extension bone foam)     Time: [id number] PT Time Calculation (min) (ACUTE ONLY): 32 min  Charges:  $Gait Training: 8-22 mins $Therapeutic Exercise: 8-22 mins                    G Codes:       Collie Siad PT, DPT 11/26/2015, 1:44 PM

## 2015-11-26 NOTE — Discharge Instructions (Signed)

## 2015-11-26 NOTE — Clinical Social Work Note (Signed)
MSW presented bed offers to patient and she chose [facility] SNF for short term rehab.  Patient to be d/c'ed today to [facility].  Patient and family agreeable to plans will transport via ems RN to call report to [phone].  Evette Cristal, MSW Mon-Fri 8a-4:30p [phone]

## 2015-11-26 NOTE — Progress Notes (Signed)
   Subjective: 3 Days Post-Op Procedure(s) (LRB): TOTAL KNEE ARTHROPLASTY (Right) Patient reports pain as mild.   Patient is well, and has had no acute complaints or problems Denies any CP, SOB, ABD pain. We will continue therapy today.    Objective: Vital signs in last 24 hours: Temp:  [98.9 F (37.2 C)-100.7 F (38.2 C)] 99.1 F (37.3 C) (10/06 0755) Pulse Rate:  [78-99] 78 (10/06 0755) Resp:  [16-19] 18 (10/06 0755) BP: (110-162)/(51-99) 124/99 (10/06 0755) SpO2:  [94 %-99 %] 94 % (10/06 0755)  Intake/Output from previous day: 10/05 0701 - 10/06 0700 In: 840 [P.O.:840] Out: 200 [Urine:200] Intake/Output this shift: No intake/output data recorded.   Recent Labs  11/23/15 1210 11/24/15 0515 11/25/15 0425  HGB 12.5 13.0 11.9*    Recent Labs  11/24/15 0515 11/25/15 0425  WBC 8.7 8.3  RBC 4.38 4.19  HCT 37.6 35.2  PLT 265 230    Recent Labs  11/24/15 0515 11/25/15 0425  NA 139 136  K 4.3 4.5  CL 105 101  CO2 30 28  BUN 14 15  CREATININE 0.87 1.01*  GLUCOSE 152* 205*  CALCIUM 8.8* 8.9   No results for input(s): LABPT, INR in the last 72 hours.  EXAM General - Patient is Alert, Appropriate and Oriented Extremity - Neurovascular intact Sensation intact distally Intact pulses distally Dorsiflexion/Plantar flexion intact No cellulitis present Compartment soft Dressing - dressing C/D/I and no drainage, dressing changed Motor Function - intact, moving foot and toes well on exam.   Past Medical History:  Diagnosis Date  . Acid reflux   . Arthritis   . Asthma   . COPD (chronic obstructive pulmonary disease) ([place])   . Depression   . Diabetes mellitus without complication ([place])   . Glaucoma   . Headache    right side of head  . Hyperlipidemia   . Hypertension   . Neuropathic pain of hands and feet   . PONV (postoperative nausea and vomiting) 1982   back with my hysterectomy.  . Sleep apnea     Assessment/Plan:   3 Days Post-Op  Procedure(s) (LRB): TOTAL KNEE ARTHROPLASTY (Right) Active Problems:   Primary localized osteoarthritis of right knee  Estimated body mass index is 37.28 kg/m as calculated from the following:   Height as of this encounter: 5\' 7"  (1.702 m).   Weight as of this encounter: 108 kg (238 lb). Advance diet Up with therapy   Discharge to SNF today pending BM, patient does not have adequate help at home. Follow up with [facility] in 2 weeks  DVT Prophylaxis - Lovenox, Foot Pumps and TED hose Weight-Bearing as tolerated to right leg   T. Rachelle Hora, PA-C [place] 11/26/2015, 7:58 AM

## 2015-11-26 NOTE — Progress Notes (Signed)
Chaplain made a follow-up visit with patient to see how patient was doing well. Patient told chaplain that she wanted to go home and was looking froward to be released from hospital and requested for prayers for healing. Chaplain offered prayers and presence.

## 2015-11-26 NOTE — Care Management (Signed)
Notified [name] of discharge today

## 2015-11-26 NOTE — Progress Notes (Signed)
Patient is being discharged to [facility] today pending BM. Suppository given.

## 2015-11-26 NOTE — Clinical Social Work Note (Signed)
MSW met with patient, she is in agreement to going to SNF for short term rehab.  MSW explained to patient that in order to go to a SNF insurance has to give approval.  MSW informed patient that if insurance company does not approve SNF placement, then patient will have to go home with home health.  MSW has faxed clinicals to SNFs in [facility] awaiting for responses back from SNF.  MSW to continue to follow patient's progress throughout discharge planning.  Jones Broom. Wanda Gardner, MSW [phone]  Mon-Fri 8a-4:30p 11/26/2015 12:24 PM

## 2015-11-26 NOTE — Progress Notes (Signed)
Report called to [facility] at [facility]. IV removed, belongings packed and EMS called.

## 2015-11-26 NOTE — Progress Notes (Signed)
EMS was unable to take the patient's commode with them. Nurse called patient's sister, Barbaraann Share, and she said she would get a family member to come and pick it up.

## 2015-11-26 NOTE — Care Management (Signed)
patient disposition changed to skilled nursing.  Notified [name].

## 2015-11-26 NOTE — Progress Notes (Signed)
Chaplain made a follow-up visit with the patient to see how the patient was doing and found Pt doing well. Patient was appreciative of prayers chaplain offered on her behalf and asked prayers for healing so she can go back home. Patient also complained that food was served cold and chaplain assured patient that those her concerns will be addressed. Chaplain provided the patient with prayers and presence.     11/26/15 1400  Clinical Encounter Type  Visited With Patient  Visit Type Follow-up;Spiritual support  Spiritual Encounters  Spiritual Needs Prayer  Stress Factors  Patient Stress Factors None identified

## 2015-11-26 NOTE — Progress Notes (Signed)
Physical Therapy Treatment Patient Details Name: Wanda Gardner MRN: [id number] DOB: 1940/08/22 Today's Date: 11/26/2015    History of Present Illness Pt. is a 75 y.o female who was admitted for a right TKR.    PT Comments    Wanda Gardner is making very modest progress toward her therapy goals and requires max encouragement to increase ambulatory distance today by 5 ft.  She relies heavily on her RW despite cues for upright posture and sequencing using RW.  She reports she will be alone with her daughter's boyfriend during the day and does not feel comfortable with him assisting her.  Recommending SNF at d/c due to current mobility status and limited assist available at home, pt remains high fall risk.  Notified SW and CM of updated d/c recommendations.   Follow Up Recommendations  Supervision for mobility/OOB;SNF     Equipment Recommendations  Rolling walker with 5" wheels    Recommendations for Other Services       Precautions / Restrictions Precautions Precautions: Knee Restrictions Weight Bearing Restrictions: Yes RLE Weight Bearing: Weight bearing as tolerated    Mobility  Bed Mobility               General bed mobility comments: Pt sitting EOB with nurse tech upon PT arrival  Transfers Overall transfer level: Needs assistance Equipment used: Rolling walker (2 wheeled) Transfers: Sit to/from Stand Sit to Stand: Min assist         General transfer comment: Cues for hand placement and min assist to boost to standing  Ambulation/Gait Ambulation/Gait assistance: Min guard Ambulation Distance (Feet): 45 Feet (25, 20) Assistive device: Rolling walker (2 wheeled) Gait Pattern/deviations: Step-to pattern;Decreased stride length;Decreased weight shift to right;Antalgic;Trunk flexed Gait velocity: decreased Gait velocity interpretation: <1.8 ft/sec, indicative of risk for recurrent falls General Gait Details: Requires max verbal cues for upright posture, forward  gaze, and sequencing with RW.  She fatigues and c/o knee and back pain after ambulating 25 ft and requests to sit before encouragement was provided to ambulate another 20 ft.     Stairs            Wheelchair Mobility    Modified Rankin (Stroke Patients Only)       Balance Overall balance assessment: Needs assistance Sitting-balance support: No upper extremity supported;Feet supported Sitting balance-Leahy Scale: Good     Standing balance support: Bilateral upper extremity supported;During functional activity Standing balance-Leahy Scale: Poor Standing balance comment: Relies on RW for support                    Cognition Arousal/Alertness: Awake/alert Behavior During Therapy: WFL for tasks assessed/performed Overall Cognitive Status: Within Functional Limits for tasks assessed                      Exercises Total Joint Exercises Quad Sets: Strengthening;Both;10 reps;Seated Long Arc Quad: AAROM;Right;10 reps;Seated;Other (comment) (with emphasis on eccentric control) Knee Flexion: AAROM;Right;Seated;5 reps;Other (comment) (with 5 second holds) Goniometric ROM: ~80 deg R knee flexion    General Comments        Pertinent Vitals/Pain Pain Assessment: 0-10 Pain Score: 8  Pain Location: R knee Pain Descriptors / Indicators: Aching;Grimacing Pain Intervention(s): Limited activity within patient's tolerance;Monitored during session;Premedicated before session;Repositioned;Ice applied    Home Living                      Prior Function  PT Goals (current goals can now be found in the care plan section) Acute Rehab PT Goals Patient Stated Goal: rehab before home PT Goal Formulation: With patient Time For Goal Achievement: 12/07/15 Potential to Achieve Goals: Good Progress towards PT goals: Progressing toward goals (very modestly progressing toward goals)    Frequency    BID      PT Plan Discharge plan needs to be  updated    Co-evaluation             End of Session Equipment Utilized During Treatment: Gait belt Activity Tolerance: Patient limited by pain;Patient limited by fatigue Patient left: with call bell/phone within reach;Other (comment);in chair;with chair alarm set (polar care in place)     Time: 0940-1006 PT Time Calculation (min) (ACUTE ONLY): 26 min  Charges:  $Gait Training: 8-22 mins $Therapeutic Exercise: 8-22 mins                    G Codes:      Collie Siad PT, DPT 11/26/2015, 10:27 AM

## 2015-11-26 NOTE — Clinical Social Work Note (Signed)
Clinical Social Work Assessment  Patient Details  Name: RENIYA MICHELINI MRN: [id number] Date of Birth: October 23, 1940  Date of referral:  11/26/15               Reason for consult:  Facility Placement                Permission sought to share information with:  Facility Sport and exercise psychologist, Family Supports Permission granted to share information::  Yes, Verbal Permission Granted  Name::     Herbin,Louise Sister [phone]   Agency::  SNF admissions  Relationship::     Contact Information:     Housing/Transportation Living arrangements for the past 2 months:  Single Family Home Source of Information:  Patient Patient Interpreter Needed:  None Criminal Activity/Legal Involvement Pertinent to Current Situation/Hospitalization:  No - Comment as needed Significant Relationships:  Adult Children, Siblings Lives with:  Adult Children, Siblings Do you feel safe going back to the place where you live?  No Need for family participation in patient care:  No (Coment)  Care giving concerns:  Patient feels she needs short term rehab at a SNF before she is able to return back home.   Social Worker assessment / plan:  Patient is a 75 year old female who lives with her siblings and adult child.  Patient is alert and oriented x4 and states she has not been to rehab before.  Patient was informed what to expect at SNF and what is involved with the discharge planning to SNF.  Patient is pleasant and talkative, patient has medicaid as a secondary.  Patient expressed that she feels she needs some extra therapy before she is able to return back home.  Patient was explained role of CSW and process for bed search, MSW informed patient that insurance my not approve her to go to SNF, and she will need to go home with home health.  Patient gave MSW permission to fax out information to SNFs in [facility].  Employment status:  Retired Nurse, adult PT Recommendations:  [place] / Referral to community resources:  [place]  Patient/Family's Response to care:  Patient is in agreement to going to SNF for short term rehab if she is approved.  Patient/Family's Understanding of and Emotional Response to Diagnosis, Current Treatment, and Prognosis:  Patient is aware of her diagnosis and current treatment plan.  Patient expressed she is nervous about going back home and would prefer to go to SNF.  Emotional Assessment Appearance:  Appears stated age Attitude/Demeanor/Rapport:    Affect (typically observed):  Appropriate, Calm, Pleasant Orientation:  Oriented to Self, Oriented to Place, Oriented to  Time, Oriented to Situation Alcohol / Substance use:  Not Applicable Psych involvement (Current and /or in the community):  No (Comment)  Discharge Needs  Concerns to be addressed:  Lack of Support Readmission within the last 30 days:    Current discharge risk:  Lack of support system Barriers to Discharge:  Insurance Authorization   Ross Ludwig 11/26/2015, 1:16 PM

## 2015-11-26 NOTE — Discharge Summary (Signed)
Physician Discharge Summary  Patient ID: LAKELEE DEVERAUX MRN: [id number] DOB/AGE: [birth year] 75 y.o.  Admit date: 11/23/2015 Discharge date: 11/26/2015  Admission Diagnoses:  primary osteoarthritis,hip stiffness unspecified laterality   Discharge Diagnoses: Patient Active Problem List   Diagnosis Date Noted  . Primary localized osteoarthritis of right knee 11/23/2015  . Stress incontinence, female 08/19/2014  . Cephalalgia 07/25/2011  . Central retinal vein occlusion 03/31/2011  . Dry eye syndrome 03/31/2011  . Glaucoma associated with vascular disorder 05/23/2010  . 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 Artificial lens present 03/22/2010  . Chronic glaucoma 03/22/2010  . Cataract, nuclear sclerotic senile 03/22/2010  . Diabetes mellitus type 2, uncontrolled ([place]) 07/26/2007  . Encounter for general adult medical examination without abnormal findings 12/23/2003  . Polypharmacy 02/03/2003    Past Medical History:  Diagnosis Date  . Acid reflux   . Arthritis   . Asthma   . COPD (chronic obstructive pulmonary disease) ([place])   . Depression   . Diabetes mellitus without complication ([place])   . Glaucoma   . Headache    right side of head  . Hyperlipidemia   . Hypertension   . Neuropathic pain of hands and feet   . PONV (postoperative nausea and vomiting) 1982   back with my hysterectomy.  . Sleep apnea      Transfusion: none   Consultants (if any):   Discharged Condition: Improved  Hospital Course: Wanda Gardner is an 75 y.o. female who was admitted 11/23/2015 with a diagnosis of right knee OA and went to the operating room on 11/23/2015 and underwent the above named procedures.    Surgeries: Procedure(s): TOTAL KNEE ARTHROPLASTY on 11/23/2015 Patient tolerated the surgery well. Taken to PACU where she was stabilized and then transferred to the orthopedic floor.  Started on Lovenox 30 q 12 hrs. Foot pumps applied bilaterally at 80 mm.  Heels elevated on bed with rolled towels. No evidence of DVT. Negative Homan. Physical therapy started on day #1 for gait training and transfer. OT started day #1 for ADL and assisted devices.  Patient's foley was d/c on day #1. Patient's IV was d/c on day #2.  On post op day #3 patient was stable and ready for discharge to SNF.  Implants: 41 and then 25 minutes at 300 mmHg, tourniquet let down for injection and hemostasis  She was given perioperative antibiotics:  Anti-infectives    Start     Dose/Rate Route Frequency Ordered Stop   11/23/15 1400  ceFAZolin (ANCEF) IVPB 2g/100 mL premix     2 g 200 mL/hr over 30 Minutes Intravenous Every 6 hours 11/23/15 1128 11/23/15 2034   11/23/15 0330  ceFAZolin (ANCEF) IVPB 2g/100 mL premix     2 g 200 mL/hr over 30 Minutes Intravenous  Once 11/23/15 0328 11/23/15 0818    .  She was given sequential compression devices, early ambulation, and lovenox for DVT prophylaxis.  She benefited maximally from the hospital stay and there were no complications.    Recent vital signs:  Vitals:   11/26/15 0517 11/26/15 0755  BP: 118/60 (!) 124/99  Pulse: 99 78  Resp: 19 18  Temp: 99.3 F (37.4 C) 99.1 F (37.3 C)    Recent laboratory studies:  Lab Results  Component Value Date   HGB 11.9 (L) 11/25/2015   HGB 13.0 11/24/2015   HGB 12.5 11/23/2015   Lab Results  Component Value Date   WBC 8.3 11/25/2015   PLT  230 11/25/2015   Lab Results  Component Value Date   INR 0.96 11/10/2015   Lab Results  Component Value Date   NA 136 11/25/2015   K 4.5 11/25/2015   CL 101 11/25/2015   CO2 28 11/25/2015   BUN 15 11/25/2015   CREATININE 1.01 (H) 11/25/2015   GLUCOSE 205 (H) 11/25/2015    Discharge Medications:     Medication List    TAKE these medications   diazepam 2 MG tablet Commonly known as:  VALIUM Take 1 tablet (2 mg total) by mouth every 8 (eight) hours as needed for muscle spasms. What changed:  Another medication with the  same name was added. Make sure you understand how and when to take each.   diazepam 2 MG tablet Commonly known as:  VALIUM Take 1 tablet (2 mg total) by mouth every 8 (eight) hours as needed for muscle spasms. What changed:  You were already taking a medication with the same name, and this prescription was added. Make sure you understand how and when to take each.   diclofenac sodium 1 % Gel Commonly known as:  VOLTAREN Apply topically 4 (four) times daily.   enoxaparin 40 MG/0.4ML injection Commonly known as:  LOVENOX Inject 0.4 mLs (40 mg total) into the skin daily.   esomeprazole 40 MG capsule Commonly known as:  NEXIUM twice a day   ferrous sulfate 325 (65 FE) MG tablet Take 325 mg by mouth daily with breakfast.   fluticasone 27.5 MCG/SPRAY nasal spray Commonly known as:  VERAMYST Place 2 sprays into the nose daily.   furosemide 20 MG tablet Commonly known as:  LASIX 20 mg every morning.   hydrochlorothiazide 25 MG tablet Commonly known as:  HYDRODIURIL once a day in am   LANTUS SOLOSTAR 100 UNIT/ML Solostar Pen Generic drug:  Insulin Glargine Inject 40 Units into the skin daily at 10 pm.   losartan 50 MG tablet Commonly known as:  COZAAR 50 mg every morning.   LYRICA 200 MG capsule Generic drug:  pregabalin Take 225 mg by mouth 2 (two) times daily.   meloxicam 15 MG tablet Commonly known as:  MOBIC once a day   metFORMIN 500 MG tablet Commonly known as:  GLUCOPHAGE twice a day   naproxen 500 MG EC tablet Commonly known as:  EC NAPROSYN Take 1 tablet (500 mg total) by mouth 2 (two) times daily with a meal.   NOVOLOG FLEXPEN 100 UNIT/ML FlexPen Generic drug:  insulin aspart 3 (three) times daily with meals. Sliding scale based on blood glucose levels.   oxyCODONE 5 MG immediate release tablet Commonly known as:  Oxy IR/ROXICODONE Take 1-2 tablets (5-10 mg total) by mouth every 3 (three) hours as needed for breakthrough pain.    oxyCODONE-acetaminophen 5-325 MG tablet Commonly known as:  PERCOCET/ROXICET Take 1 tablet by mouth every 4 (four) hours as needed for severe pain.   potassium chloride SA 20 MEQ tablet Commonly known as:  K-DUR,KLOR-CON once a day in am   pravastatin 20 MG tablet Commonly known as:  PRAVACHOL Take 20 mg by mouth at bedtime.   promethazine-dextromethorphan 6.25-15 MG/5ML syrup Commonly known as:  PROMETHAZINE-DM Take 5 mLs by mouth 4 (four) times daily as needed for cough.   ULTICARE SHORT PEN NEEDLES 31G X 8 MM Misc Generic drug:  Insulin Pen Needle   VENTOLIN HFA 108 (90 Base) MCG/ACT inhaler Generic drug:  albuterol Inhale 2 puffs into the lungs every 6 (six) hours as needed.  vitamin B-12 1000 MCG tablet Commonly known as:  CYANOCOBALAMIN Take 1,000 mcg by mouth daily.   Vitamin D3 1000 units Caps Take by mouth.       Diagnostic Studies: Dg Knee 1-2 Views Right  Result Date: 11/23/2015 CLINICAL DATA:  Status post right knee replacement today. EXAM: RIGHT KNEE - 1-2 VIEW COMPARISON:  CT right knee 10/27/2015. FINDINGS: New right total knee arthroplasty is in place. The device is located and there is no fracture. Gas in the soft tissues and surgical staples are noted. IMPRESSION: Right knee replacement without evidence of complication. Electronically Signed   By: Inge Rise M.D.   On: 11/23/2015 10:29   Ct Knee Right Wo Contrast  Result Date: 10/27/2015 CLINICAL DATA:  Chronic right knee pain.  Preop knee replacement. EXAM: CT OF THE RIGHT KNEE WITHOUT CONTRAST TECHNIQUE: Multidetector CT imaging of the RIGHT knee was performed according to the standard protocol. Multiplanar CT image reconstructions were also generated. COMPARISON:  None. FINDINGS: The hip demonstrates no fracture or dislocation. There is no lytic or blastic lesion. There is mild osteoarthritis of the visualize right SI joint. The knee demonstrates no fracture or dislocation. There is no lytic or  blastic lesion. Tricompartmental osteoarthritis of the right knee most severe in the lateral femorotibial compartment with joint space narrowing, subchondral cystic changes, subchondral sclerosis and marginal osteophytosis. 15 mm subchondral cyst in the posterior medial tibial plateau. There is a small joint effusion. The ankle demonstrates no fracture or dislocation. There is no lytic or blastic lesion. There is peripheral vascular atherosclerotic disease. IMPRESSION: 1. Tricompartmental osteoarthritis of the right knee. Electronically Signed   By: Kathreen Devoid   On: 10/27/2015 17:05    Disposition: 01-Home or Self Care       Signed: Dorise Hiss CHRISTOPHER 11/26/2015, 8:04 AM

## 2015-11-26 NOTE — Clinical Social Work Placement (Signed)
   CLINICAL SOCIAL WORK PLACEMENT  NOTE  Date:  11/26/2015  Patient Details  Name: Wanda Gardner MRN: [id number] Date of Birth: 1940/08/10  Clinical Social Work is seeking post-discharge placement for this patient at the [place] level of care (*CSW will initial, date and re-position this form in  chart as items are completed):  Yes   Patient/family provided with [facility] Work Department's list of facilities offering this level of care within the geographic area requested by the patient (or if unable, by the patient's family).  Yes   Patient/family informed of their freedom to choose among providers that offer the needed level of care, that participate in Medicare, Medicaid or managed care program needed by the patient, have an available bed and are willing to accept the patient.  Yes   Patient/family informed of [facility]'s ownership interest in [facility] and [facility], as well as of the fact that they are under no obligation to receive care at these facilities.  PASRR submitted to EDS on 11/23/15     PASRR number received on 11/23/15     Existing PASRR number confirmed on       FL2 transmitted to all facilities in geographic area requested by pt/family on 11/26/15     FL2 transmitted to all facilities within larger geographic area on       Patient informed that his/her managed care company has contracts with or will negotiate with certain facilities, including the following:            Patient/family informed of bed offers received.  Patient chooses bed at       Physician recommends and patient chooses bed at      Patient to be transferred to   on  .  Patient to be transferred to facility by       Patient family notified on   of transfer.  Name of family member notified:        PHYSICIAN       Additional Comment:    _______________________________________________ Ross Ludwig 11/26/2015, 1:23 PM

## 2015-11-27 LAB — GLUCOSE, CAPILLARY: GLUCOSE-CAPILLARY: 268 mg/dL — AB (ref 65–99)

## 2016-06-06 ENCOUNTER — Encounter
Admission: RE | Admit: 2016-06-06 | Discharge: 2016-06-06 | Disposition: A | Discharge status: Home / Self Care | Payer: Medicare HMO | Source: Ambulatory Visit | Attending: Orthopedic Surgery | Admitting: Orthopedic Surgery

## 2016-06-06 DIAGNOSIS — M1611 Unilateral primary osteoarthritis, right hip: Secondary | ICD-10-CM | POA: Insufficient documentation

## 2016-06-06 DIAGNOSIS — Z7984 Long term (current) use of oral hypoglycemic drugs: Secondary | ICD-10-CM | POA: Insufficient documentation

## 2016-06-06 DIAGNOSIS — E119 Type 2 diabetes mellitus without complications: Secondary | ICD-10-CM | POA: Insufficient documentation

## 2016-06-06 DIAGNOSIS — Z79899 Other long term (current) drug therapy: Secondary | ICD-10-CM | POA: Insufficient documentation

## 2016-06-06 DIAGNOSIS — Z7982 Long term (current) use of aspirin: Secondary | ICD-10-CM | POA: Insufficient documentation

## 2016-06-06 DIAGNOSIS — Z01818 Encounter for other preprocedural examination: Secondary | ICD-10-CM | POA: Insufficient documentation

## 2016-06-06 HISTORY — DX: Dyspnea, unspecified: R06.00

## 2016-06-06 LAB — TYPE AND SCREEN
ABO/RH(D): O POS
ANTIBODY SCREEN: NEGATIVE

## 2016-06-06 LAB — URINALYSIS, COMPLETE (UACMP) WITH MICROSCOPIC
BACTERIA UA: NONE SEEN
Bilirubin Urine: NEGATIVE
GLUCOSE, UA: NEGATIVE mg/dL
Hgb urine dipstick: NEGATIVE
KETONES UR: NEGATIVE mg/dL
Leukocytes, UA: NEGATIVE
Nitrite: NEGATIVE
PROTEIN: NEGATIVE mg/dL
Specific Gravity, Urine: 1.023 (ref 1.005–1.030)
pH: 5 (ref 5.0–8.0)

## 2016-06-06 LAB — BASIC METABOLIC PANEL
Anion gap: 9 (ref 5–15)
BUN: 26 mg/dL — AB (ref 6–20)
CHLORIDE: 100 mmol/L — AB (ref 101–111)
CO2: 32 mmol/L (ref 22–32)
CREATININE: 1.13 mg/dL — AB (ref 0.44–1.00)
Calcium: 9.5 mg/dL (ref 8.9–10.3)
GFR calc Af Amer: 54 mL/min — ABNORMAL LOW (ref >60)
GFR calc non Af Amer: 47 mL/min — ABNORMAL LOW (ref >60)
GLUCOSE: 66 mg/dL (ref 65–99)
POTASSIUM: 3.5 mmol/L (ref 3.5–5.1)
Sodium: 141 mmol/L (ref 135–145)

## 2016-06-06 LAB — SURGICAL PCR SCREEN
MRSA, PCR: NEGATIVE
STAPHYLOCOCCUS AUREUS: NEGATIVE

## 2016-06-06 LAB — CBC
HEMATOCRIT: 37.6 % (ref 35.0–47.0)
HEMOGLOBIN: 12.5 g/dL (ref 12.0–16.0)
MCH: 28.6 pg (ref 26.0–34.0)
MCHC: 33.3 g/dL (ref 32.0–36.0)
MCV: 85.8 fL (ref 80.0–100.0)
Platelets: 332 10*3/uL (ref 150–440)
RBC: 4.39 MIL/uL (ref 3.80–5.20)
RDW: 15.5 % — AB (ref 11.5–14.5)
WBC: 6.2 10*3/uL (ref 3.6–11.0)

## 2016-06-06 LAB — SEDIMENTATION RATE: SED RATE: 20 mm/h (ref 0–30)

## 2016-06-06 LAB — PROTIME-INR
INR: 1
Prothrombin Time: 13.2 seconds (ref 11.4–15.2)

## 2016-06-06 LAB — APTT: aPTT: 35 seconds (ref 24–36)

## 2016-06-06 NOTE — Patient Instructions (Signed)
Your procedure is scheduled on: 06/13/16 Tues Report to Same Day Surgery 2nd floor medical mall [facility] Entrance-take elevator on left to 2nd floor.  Check in with surgery information desk.) To find out your arrival time please call [phone] between 1PM - 3PM on 06/12/16 Mon  Remember: Instructions that are not followed completely may result in serious medical risk, up to and including death, or upon the discretion of your surgeon and anesthesiologist your surgery may need to be rescheduled.    _x___ 1. Do not eat food or drink liquids after midnight. No gum chewing or                              hard candies.     __x__ 2. No Alcohol for 24 hours before or after surgery.   __x__3. No Smoking for 24 prior to surgery.   ____  4. Bring all medications with you on the day of surgery if instructed.    __x__ 5. Notify your doctor if there is any change in your medical condition     (cold, fever, infections).     Do not wear jewelry, make-up, hairpins, clips or nail polish.  Do not wear lotions, powders, or perfumes. You may wear deodorant.  Do not shave 48 hours prior to surgery. Men may shave face and neck.  Do not bring valuables to the hospital.    [facility] is not responsible for any belongings or valuables.               Contacts, dentures or bridgework may not be worn into surgery.  Leave your suitcase in the car. After surgery it may be brought to your room.  For patients admitted to the hospital, discharge time is determined by your                       treatment team.   Patients discharged the day of surgery will not be allowed to drive home.  You will need someone to drive you home and stay with you the night of your procedure.    Please read over the following fact sheets that you were given:   [facility] Preparing for Surgery and or MRSA Information   _x___ Take anti-hypertensive (unless it includes a diuretic), cardiac, seizure, asthma,     anti-reflux and  psychiatric medicines. These include:  1. esomeprazole ([place]  2.LYRICA 225   3.metoprolol succinate (TOPROL-XL)  4.VENTOLIN HFA 108   5.  6.  ____Fleets enema or Magnesium Citrate as directed.   _x___ Use CHG Soap or sage wipes as directed on instruction sheet   _x___ Use inhalers on the day of surgery and bring to hospital day of surgery  __x__ Stop Metformin and Janumet 2 days prior to surgery.    __x_ Take 1/2 of usual insulin dose the night before surgery and none on the morning     surgery.   _x___ Follow recommendations from Cardiologist, Pulmonologist or PCP regarding          stopping Aspirin, Coumadin, Pllavix ,Eliquis, Effient, or Pradaxa, and Pletal.  X____Stop Anti-inflammatories such as Advil, Aleve, Ibuprofen, Motrin, Naproxen, Naprosyn, Goodies powders or aspirin products. OK to take Tylenol and Celebrex.   _x___ Stop supplements until after surgery.  But may continue Vitamin D, Vitamin B,       and multivitamin.  Stop vitamin E   ____ Bring C-Pap  to the hospital.

## 2016-06-07 LAB — URINE CULTURE: CULTURE: NO GROWTH

## 2016-06-13 ENCOUNTER — Inpatient Hospital Stay: Payer: Medicare HMO | Admitting: Certified Registered"

## 2016-06-13 ENCOUNTER — Encounter
Admission: RE | Disposition: A | Discharge status: Home Health | Payer: Self-pay | Source: Ambulatory Visit | Attending: Orthopedic Surgery

## 2016-06-13 ENCOUNTER — Inpatient Hospital Stay: Payer: Medicare HMO

## 2016-06-13 ENCOUNTER — Encounter: Payer: Self-pay | Admitting: *Deleted

## 2016-06-13 ENCOUNTER — Inpatient Hospital Stay
Admission: RE | Admit: 2016-06-13 | Discharge: 2016-06-15 | DRG: 470 | Disposition: A | Discharge status: Home Health | Payer: Medicare HMO | Source: Ambulatory Visit | Attending: Orthopedic Surgery | Admitting: Orthopedic Surgery

## 2016-06-13 DIAGNOSIS — G4730 Sleep apnea, unspecified: Secondary | ICD-10-CM | POA: Diagnosis present

## 2016-06-13 DIAGNOSIS — M1611 Unilateral primary osteoarthritis, right hip: Secondary | ICD-10-CM | POA: Diagnosis present

## 2016-06-13 DIAGNOSIS — F17200 Nicotine dependence, unspecified, uncomplicated: Secondary | ICD-10-CM | POA: Diagnosis present

## 2016-06-13 DIAGNOSIS — D62 Acute posthemorrhagic anemia: Secondary | ICD-10-CM | POA: Diagnosis not present

## 2016-06-13 DIAGNOSIS — G8918 Other acute postprocedural pain: Secondary | ICD-10-CM

## 2016-06-13 DIAGNOSIS — J449 Chronic obstructive pulmonary disease, unspecified: Secondary | ICD-10-CM | POA: Diagnosis present

## 2016-06-13 DIAGNOSIS — H409 Unspecified glaucoma: Secondary | ICD-10-CM | POA: Diagnosis present

## 2016-06-13 DIAGNOSIS — K219 Gastro-esophageal reflux disease without esophagitis: Secondary | ICD-10-CM | POA: Diagnosis present

## 2016-06-13 DIAGNOSIS — Z7984 Long term (current) use of oral hypoglycemic drugs: Secondary | ICD-10-CM | POA: Diagnosis not present

## 2016-06-13 DIAGNOSIS — E1151 Type 2 diabetes mellitus with diabetic peripheral angiopathy without gangrene: Secondary | ICD-10-CM | POA: Diagnosis present

## 2016-06-13 DIAGNOSIS — E785 Hyperlipidemia, unspecified: Secondary | ICD-10-CM | POA: Diagnosis present

## 2016-06-13 DIAGNOSIS — I10 Essential (primary) hypertension: Secondary | ICD-10-CM | POA: Diagnosis present

## 2016-06-13 DIAGNOSIS — Z419 Encounter for procedure for purposes other than remedying health state, unspecified: Secondary | ICD-10-CM

## 2016-06-13 HISTORY — PX: TOTAL HIP ARTHROPLASTY: SHX124

## 2016-06-13 LAB — URINE DRUG SCREEN, QUALITATIVE (ARMC ONLY)
Amphetamines, Ur Screen: NOT DETECTED
Barbiturates, Ur Screen: NOT DETECTED
Benzodiazepine, Ur Scrn: NOT DETECTED
Cannabinoid 50 Ng, Ur ~~LOC~~: POSITIVE — AB
Cocaine Metabolite,Ur ~~LOC~~: NOT DETECTED
MDMA (Ecstasy)Ur Screen: NOT DETECTED
Methadone Scn, Ur: NOT DETECTED
Opiate, Ur Screen: NOT DETECTED
Phencyclidine (PCP) Ur S: NOT DETECTED
Tricyclic, Ur Screen: NOT DETECTED

## 2016-06-13 LAB — CBC
HCT: 34.8 % — ABNORMAL LOW (ref 35.0–47.0)
Hemoglobin: 11.6 g/dL — ABNORMAL LOW (ref 12.0–16.0)
MCH: 28.5 pg (ref 26.0–34.0)
MCHC: 33.3 g/dL (ref 32.0–36.0)
MCV: 85.7 fL (ref 80.0–100.0)
PLATELETS: 278 10*3/uL (ref 150–440)
RBC: 4.05 MIL/uL (ref 3.80–5.20)
RDW: 15.2 % — ABNORMAL HIGH (ref 11.5–14.5)
WBC: 10.6 10*3/uL (ref 3.6–11.0)

## 2016-06-13 LAB — GLUCOSE, CAPILLARY
Glucose-Capillary: 173 mg/dL — ABNORMAL HIGH (ref 65–99)
Glucose-Capillary: 207 mg/dL — ABNORMAL HIGH (ref 65–99)
Glucose-Capillary: 198 mg/dL — ABNORMAL HIGH (ref 65–99)
Glucose-Capillary: 279 mg/dL — ABNORMAL HIGH (ref 65–99)

## 2016-06-13 LAB — CREATININE, SERUM
CREATININE: 1.25 mg/dL — AB (ref 0.44–1.00)
GFR calc Af Amer: 48 mL/min — ABNORMAL LOW (ref >60)
GFR, EST NON AFRICAN AMERICAN: 41 mL/min — AB (ref >60)

## 2016-06-13 SURGERY — ARTHROPLASTY, HIP, TOTAL, ANTERIOR APPROACH
Anesthesia: General | Site: Hip | Laterality: Right | Wound class: Clean

## 2016-06-13 MED ORDER — BRINZOLAMIDE 1 % OP SUSP
1.0000 [drp] | Freq: Two times a day (BID) | OPHTHALMIC | Status: DC
  Administered 2016-06-13 – 2016-06-15 (×4): 1 [drp] via OPHTHALMIC
  Filled 2016-06-13: qty 10

## 2016-06-13 MED ORDER — BISACODYL 10 MG RE SUPP
10.0000 mg | Freq: Every day | RECTAL | Status: DC | PRN
  Administered 2016-06-15: 10 mg via RECTAL
  Filled 2016-06-13: qty 1

## 2016-06-13 MED ORDER — VASOPRESSIN 20 UNIT/ML IV SOLN
INTRAVENOUS | Status: DC | PRN
  Administered 2016-06-13 (×3): 2 [IU] via INTRAVENOUS

## 2016-06-13 MED ORDER — ROCURONIUM BROMIDE 100 MG/10ML IV SOLN
INTRAVENOUS | Status: DC | PRN
  Administered 2016-06-13: 10 mg via INTRAVENOUS
  Administered 2016-06-13: 50 mg via INTRAVENOUS

## 2016-06-13 MED ORDER — DOCUSATE SODIUM 100 MG PO CAPS
100.0000 mg | ORAL_CAPSULE | Freq: Two times a day (BID) | ORAL | Status: DC
  Administered 2016-06-13 – 2016-06-15 (×4): 100 mg via ORAL
  Filled 2016-06-13 (×4): qty 1

## 2016-06-13 MED ORDER — PROPOFOL 10 MG/ML IV BOLUS
INTRAVENOUS | Status: DC | PRN
  Administered 2016-06-13: 150 mg via INTRAVENOUS

## 2016-06-13 MED ORDER — FAMOTIDINE 20 MG PO TABS
20.0000 mg | ORAL_TABLET | Freq: Once | ORAL | Status: AC
  Administered 2016-06-13: 20 mg via ORAL

## 2016-06-13 MED ORDER — LIDOCAINE HCL (PF) 2 % IJ SOLN
INTRAMUSCULAR | Status: AC
  Filled 2016-06-13: qty 2

## 2016-06-13 MED ORDER — ASPIRIN EC 81 MG PO TBEC
81.0000 mg | DELAYED_RELEASE_TABLET | Freq: Every day | ORAL | Status: DC
  Administered 2016-06-14 – 2016-06-15 (×2): 81 mg via ORAL
  Filled 2016-06-13 (×2): qty 1

## 2016-06-13 MED ORDER — ENOXAPARIN SODIUM 40 MG/0.4ML ~~LOC~~ SOLN
40.0000 mg | SUBCUTANEOUS | Status: DC
  Administered 2016-06-14 – 2016-06-15 (×2): 40 mg via SUBCUTANEOUS
  Filled 2016-06-13 (×2): qty 0.4

## 2016-06-13 MED ORDER — LIDOCAINE HCL (CARDIAC) 20 MG/ML IV SOLN
INTRAVENOUS | Status: DC | PRN
  Administered 2016-06-13: 50 mg via INTRAVENOUS

## 2016-06-13 MED ORDER — SODIUM CHLORIDE 0.9 % IV SOLN
INTRAVENOUS | Status: DC
Start: 2016-06-13 — End: 2016-06-13
  Administered 2016-06-13 (×2): via INTRAVENOUS

## 2016-06-13 MED ORDER — DIPHENHYDRAMINE HCL 12.5 MG/5ML PO ELIX: 12.5000 mg | ORAL_SOLUTION | ORAL | Status: DC | PRN

## 2016-06-13 MED ORDER — EPHEDRINE SULFATE 50 MG/ML IJ SOLN
INTRAMUSCULAR | Status: DC | PRN
  Administered 2016-06-13 (×3): 10 mg via INTRAVENOUS
  Administered 2016-06-13: 20 mg via INTRAVENOUS

## 2016-06-13 MED ORDER — ROCURONIUM BROMIDE 100 MG/10ML IV SOLN
INTRAVENOUS | Status: AC
  Filled 2016-06-13: qty 1

## 2016-06-13 MED ORDER — NAPROXEN 500 MG PO TABS
500.0000 mg | ORAL_TABLET | Freq: Every day | ORAL | Status: DC
  Administered 2016-06-14 – 2016-06-15 (×2): 500 mg via ORAL
  Filled 2016-06-13 (×3): qty 1

## 2016-06-13 MED ORDER — OXYCODONE HCL 5 MG PO TABS
5.0000 mg | ORAL_TABLET | ORAL | Status: DC | PRN
  Administered 2016-06-13: 5 mg via ORAL
  Administered 2016-06-14 (×2): 10 mg via ORAL
  Administered 2016-06-14: 5 mg via ORAL
  Administered 2016-06-14 – 2016-06-15 (×4): 10 mg via ORAL
  Filled 2016-06-13: qty 2
  Filled 2016-06-13: qty 1
  Filled 2016-06-13 (×4): qty 2
  Filled 2016-06-13: qty 1
  Filled 2016-06-13 (×2): qty 2

## 2016-06-13 MED ORDER — MAGNESIUM CITRATE PO SOLN
1.0000 | Freq: Once | ORAL | Status: DC | PRN
  Filled 2016-06-13: qty 296

## 2016-06-13 MED ORDER — HYDROMORPHONE HCL 1 MG/ML IJ SOLN
INTRAMUSCULAR | Status: DC | PRN
  Administered 2016-06-13 (×2): 0.5 mg via INTRAVENOUS

## 2016-06-13 MED ORDER — SUGAMMADEX SODIUM 200 MG/2ML IV SOLN
INTRAVENOUS | Status: AC
  Filled 2016-06-13: qty 2

## 2016-06-13 MED ORDER — SODIUM CHLORIDE 0.9 % IV SOLN
INTRAVENOUS | Status: DC
  Administered 2016-06-13 (×2): via INTRAVENOUS

## 2016-06-13 MED ORDER — NEOMYCIN-POLYMYXIN B GU 40-200000 IR SOLN
Status: AC
  Filled 2016-06-13: qty 4

## 2016-06-13 MED ORDER — METFORMIN HCL 500 MG PO TABS
500.0000 mg | ORAL_TABLET | Freq: Two times a day (BID) | ORAL | Status: DC
  Administered 2016-06-13 – 2016-06-15 (×4): 500 mg via ORAL
  Filled 2016-06-13 (×4): qty 1

## 2016-06-13 MED ORDER — LOSARTAN POTASSIUM-HCTZ 100-12.5 MG PO TABS: 1.0000 | ORAL_TABLET | Freq: Every day | ORAL | Status: DC

## 2016-06-13 MED ORDER — DEXTROMETHORPHAN POLISTIREX ER 30 MG/5ML PO SUER
15.0000 mg | Freq: Four times a day (QID) | ORAL | Status: DC | PRN
  Filled 2016-06-13: qty 2.5

## 2016-06-13 MED ORDER — INSULIN ASPART 100 UNIT/ML ~~LOC~~ SOLN
4.0000 [IU] | Freq: Three times a day (TID) | SUBCUTANEOUS | Status: DC
  Administered 2016-06-13 – 2016-06-15 (×6): 4 [IU] via SUBCUTANEOUS
  Filled 2016-06-13 (×5): qty 4

## 2016-06-13 MED ORDER — GLYCOPYRROLATE 0.2 MG/ML IJ SOLN
INTRAMUSCULAR | Status: DC | PRN
  Administered 2016-06-13: 0.2 mg via INTRAVENOUS

## 2016-06-13 MED ORDER — KETAMINE HCL 10 MG/ML IJ SOLN
INTRAMUSCULAR | Status: DC | PRN
  Administered 2016-06-13 (×2): 25 mg via INTRAVENOUS

## 2016-06-13 MED ORDER — FENTANYL CITRATE (PF) 100 MCG/2ML IJ SOLN
INTRAMUSCULAR | Status: DC | PRN
  Administered 2016-06-13 (×2): 50 ug via INTRAVENOUS

## 2016-06-13 MED ORDER — BUPIVACAINE-EPINEPHRINE 0.25% -1:200000 IJ SOLN
INTRAMUSCULAR | Status: DC | PRN
  Administered 2016-06-13: 30 mL

## 2016-06-13 MED ORDER — FUROSEMIDE 40 MG PO TABS
40.0000 mg | ORAL_TABLET | Freq: Every day | ORAL | Status: DC
  Administered 2016-06-14: 40 mg via ORAL
  Filled 2016-06-13 (×2): qty 1

## 2016-06-13 MED ORDER — KETAMINE HCL 50 MG/ML IJ SOLN
INTRAMUSCULAR | Status: AC
  Filled 2016-06-13: qty 10

## 2016-06-13 MED ORDER — METOPROLOL TARTRATE 50 MG PO TABS
50.0000 mg | ORAL_TABLET | Freq: Once | ORAL | Status: AC
Start: 2016-06-13 — End: 2016-06-13
  Administered 2016-06-13: 50 mg via ORAL

## 2016-06-13 MED ORDER — PROPOFOL 10 MG/ML IV BOLUS
INTRAVENOUS | Status: AC
  Filled 2016-06-13: qty 20

## 2016-06-13 MED ORDER — FERROUS SULFATE 325 (65 FE) MG PO TABS
325.0000 mg | ORAL_TABLET | Freq: Every day | ORAL | Status: DC
  Administered 2016-06-14 – 2016-06-15 (×2): 325 mg via ORAL
  Filled 2016-06-13 (×2): qty 1

## 2016-06-13 MED ORDER — INSULIN ASPART 100 UNIT/ML ~~LOC~~ SOLN
SUBCUTANEOUS | Status: AC
  Administered 2016-06-13: 5 [IU] via SUBCUTANEOUS
  Filled 2016-06-13: qty 5

## 2016-06-13 MED ORDER — DOCUSATE SODIUM 100 MG PO CAPS: 100.0000 mg | ORAL_CAPSULE | Freq: Two times a day (BID) | ORAL | Status: DC

## 2016-06-13 MED ORDER — NEOMYCIN-POLYMYXIN B GU 40-200000 IR SOLN
Status: DC | PRN
  Administered 2016-06-13: 4 mL

## 2016-06-13 MED ORDER — INSULIN ASPART 100 UNIT/ML ~~LOC~~ SOLN
0.0000 [IU] | Freq: Three times a day (TID) | SUBCUTANEOUS | Status: DC
  Administered 2016-06-13: 3 [IU] via SUBCUTANEOUS
  Administered 2016-06-14: 2 [IU] via SUBCUTANEOUS
  Administered 2016-06-14: 8 [IU] via SUBCUTANEOUS
  Administered 2016-06-14: 5 [IU] via SUBCUTANEOUS
  Administered 2016-06-15: 3 [IU] via SUBCUTANEOUS
  Administered 2016-06-15: 2 [IU] via SUBCUTANEOUS
  Filled 2016-06-13: qty 2
  Filled 2016-06-13 (×2): qty 3
  Filled 2016-06-13: qty 8
  Filled 2016-06-13: qty 5

## 2016-06-13 MED ORDER — VITAMIN D 1000 UNITS PO TABS
5000.0000 [IU] | ORAL_TABLET | Freq: Every day | ORAL | Status: DC
  Administered 2016-06-14 – 2016-06-15 (×2): 5000 [IU] via ORAL
  Filled 2016-06-13 (×2): qty 5

## 2016-06-13 MED ORDER — FAMOTIDINE 20 MG PO TABS
ORAL_TABLET | ORAL | Status: AC
  Filled 2016-06-13: qty 1

## 2016-06-13 MED ORDER — ALUM & MAG HYDROXIDE-SIMETH 200-200-20 MG/5ML PO SUSP: 30.0000 mL | ORAL | Status: DC | PRN

## 2016-06-13 MED ORDER — CLOBETASOL PROPIONATE 0.05 % EX CREA
1.0000 | TOPICAL_CREAM | Freq: Two times a day (BID) | CUTANEOUS | Status: DC
Start: 2016-06-13 — End: 2016-06-15
  Administered 2016-06-13 – 2016-06-15 (×3): 1 via TOPICAL
  Filled 2016-06-13: qty 15

## 2016-06-13 MED ORDER — VITAMIN E 180 MG (400 UNIT) PO CAPS
400.0000 [IU] | ORAL_CAPSULE | Freq: Every day | ORAL | Status: DC
  Administered 2016-06-14 – 2016-06-15 (×2): 400 [IU] via ORAL
  Filled 2016-06-13: qty 1

## 2016-06-13 MED ORDER — ACETAMINOPHEN 650 MG RE SUPP: 650.0000 mg | Freq: Four times a day (QID) | RECTAL | Status: DC | PRN

## 2016-06-13 MED ORDER — MIDAZOLAM HCL 2 MG/2ML IJ SOLN
INTRAMUSCULAR | Status: AC
  Filled 2016-06-13: qty 2

## 2016-06-13 MED ORDER — MAGNESIUM HYDROXIDE 400 MG/5ML PO SUSP
30.0000 mL | Freq: Every day | ORAL | Status: DC | PRN
  Administered 2016-06-15: 30 mL via ORAL
  Filled 2016-06-13: qty 30

## 2016-06-13 MED ORDER — FENTANYL CITRATE (PF) 100 MCG/2ML IJ SOLN: 25.0000 ug | INTRAMUSCULAR | Status: DC | PRN

## 2016-06-13 MED ORDER — ACETAMINOPHEN 10 MG/ML IV SOLN
INTRAVENOUS | Status: AC
  Filled 2016-06-13: qty 100

## 2016-06-13 MED ORDER — METOCLOPRAMIDE HCL 10 MG PO TABS
5.0000 mg | ORAL_TABLET | Freq: Three times a day (TID) | ORAL | Status: DC | PRN
Start: 2016-06-13 — End: 2016-06-15

## 2016-06-13 MED ORDER — ONDANSETRON HCL 4 MG PO TABS: 4.0000 mg | ORAL_TABLET | Freq: Four times a day (QID) | ORAL | Status: DC | PRN

## 2016-06-13 MED ORDER — PHENYLEPHRINE HCL 10 MG/ML IJ SOLN
INTRAMUSCULAR | Status: DC | PRN
  Administered 2016-06-13 (×5): 100 ug via INTRAVENOUS

## 2016-06-13 MED ORDER — PRAVASTATIN SODIUM 40 MG PO TABS
40.0000 mg | ORAL_TABLET | Freq: Every day | ORAL | Status: DC
  Administered 2016-06-13 – 2016-06-14 (×2): 40 mg via ORAL
  Filled 2016-06-13 (×2): qty 1

## 2016-06-13 MED ORDER — HYDROCHLOROTHIAZIDE 12.5 MG PO CAPS
12.5000 mg | ORAL_CAPSULE | Freq: Every day | ORAL | Status: DC
  Administered 2016-06-14: 12.5 mg via ORAL
  Filled 2016-06-13 (×2): qty 1

## 2016-06-13 MED ORDER — DEXAMETHASONE SODIUM PHOSPHATE 10 MG/ML IJ SOLN
INTRAMUSCULAR | Status: DC | PRN
  Administered 2016-06-13: 5 mg via INTRAVENOUS

## 2016-06-13 MED ORDER — SUCCINYLCHOLINE CHLORIDE 20 MG/ML IJ SOLN
INTRAMUSCULAR | Status: DC | PRN
  Administered 2016-06-13: 100 mg via INTRAVENOUS

## 2016-06-13 MED ORDER — METOCLOPRAMIDE HCL 5 MG/ML IJ SOLN: 5.0000 mg | Freq: Three times a day (TID) | INTRAMUSCULAR | Status: DC | PRN

## 2016-06-13 MED ORDER — ALBUTEROL SULFATE (2.5 MG/3ML) 0.083% IN NEBU: 2.5000 mg | INHALATION_SOLUTION | Freq: Four times a day (QID) | RESPIRATORY_TRACT | Status: DC | PRN

## 2016-06-13 MED ORDER — GLYCOPYRROLATE 0.2 MG/ML IJ SOLN
INTRAMUSCULAR | Status: AC
  Filled 2016-06-13: qty 1

## 2016-06-13 MED ORDER — PROMETHAZINE-DM 6.25-15 MG/5ML PO SYRP: 5.0000 mL | ORAL_SOLUTION | Freq: Four times a day (QID) | ORAL | Status: DC | PRN

## 2016-06-13 MED ORDER — ONDANSETRON HCL 4 MG/2ML IJ SOLN
INTRAMUSCULAR | Status: DC | PRN
  Administered 2016-06-13: 4 mg via INTRAVENOUS

## 2016-06-13 MED ORDER — INSULIN GLARGINE 100 UNIT/ML ~~LOC~~ SOLN
45.0000 [IU] | Freq: Every day | SUBCUTANEOUS | Status: DC
  Administered 2016-06-13 – 2016-06-14 (×2): 45 [IU] via SUBCUTANEOUS
  Filled 2016-06-13 (×4): qty 0.45

## 2016-06-13 MED ORDER — FENTANYL CITRATE (PF) 100 MCG/2ML IJ SOLN
INTRAMUSCULAR | Status: AC
  Filled 2016-06-13: qty 2

## 2016-06-13 MED ORDER — SUGAMMADEX SODIUM 200 MG/2ML IV SOLN
INTRAVENOUS | Status: DC | PRN
  Administered 2016-06-13: 200 mg via INTRAVENOUS

## 2016-06-13 MED ORDER — MENTHOL 3 MG MT LOZG
1.0000 | LOZENGE | OROMUCOSAL | Status: DC | PRN
  Filled 2016-06-13: qty 9

## 2016-06-13 MED ORDER — ALBUTEROL SULFATE HFA 108 (90 BASE) MCG/ACT IN AERS: 2.0000 | INHALATION_SPRAY | Freq: Four times a day (QID) | RESPIRATORY_TRACT | Status: DC | PRN

## 2016-06-13 MED ORDER — PANTOPRAZOLE SODIUM 40 MG PO TBEC
80.0000 mg | DELAYED_RELEASE_TABLET | Freq: Every day | ORAL | Status: DC
  Administered 2016-06-13 – 2016-06-15 (×3): 80 mg via ORAL
  Filled 2016-06-13 (×3): qty 2

## 2016-06-13 MED ORDER — ONDANSETRON HCL 4 MG/2ML IJ SOLN
INTRAMUSCULAR | Status: AC
  Filled 2016-06-13: qty 2

## 2016-06-13 MED ORDER — MIDAZOLAM HCL 2 MG/2ML IJ SOLN
INTRAMUSCULAR | Status: DC | PRN
  Administered 2016-06-13 (×2): 1 mg via INTRAVENOUS

## 2016-06-13 MED ORDER — SUCCINYLCHOLINE CHLORIDE 20 MG/ML IJ SOLN
INTRAMUSCULAR | Status: AC
  Filled 2016-06-13: qty 1

## 2016-06-13 MED ORDER — INSULIN ASPART 100 UNIT/ML ~~LOC~~ SOLN
5.0000 [IU] | Freq: Once | SUBCUTANEOUS | Status: AC
  Administered 2016-06-13: 5 [IU] via SUBCUTANEOUS

## 2016-06-13 MED ORDER — MORPHINE SULFATE (PF) 2 MG/ML IV SOLN: 2.0000 mg | INTRAVENOUS | Status: DC | PRN

## 2016-06-13 MED ORDER — BUPIVACAINE-EPINEPHRINE (PF) 0.25% -1:200000 IJ SOLN
INTRAMUSCULAR | Status: AC
  Filled 2016-06-13: qty 30

## 2016-06-13 MED ORDER — BIOTIN 1000 MCG PO TABS: 1.0000 | ORAL_TABLET | Freq: Every day | ORAL | Status: DC

## 2016-06-13 MED ORDER — DULOXETINE HCL 60 MG PO CPEP
60.0000 mg | ORAL_CAPSULE | Freq: Every day | ORAL | Status: DC
  Administered 2016-06-13 – 2016-06-14 (×2): 60 mg via ORAL
  Filled 2016-06-13 (×2): qty 1

## 2016-06-13 MED ORDER — TRANEXAMIC ACID 1000 MG/10ML IV SOLN
1000.0000 mg | INTRAVENOUS | Status: AC
  Administered 2016-06-13: 1000 mg via INTRAVENOUS
  Filled 2016-06-13 (×2): qty 10

## 2016-06-13 MED ORDER — FLUTICASONE PROPIONATE 50 MCG/ACT NA SUSP
2.0000 | Freq: Two times a day (BID) | NASAL | Status: DC
  Administered 2016-06-13 – 2016-06-15 (×4): 2 via NASAL
  Filled 2016-06-13: qty 16

## 2016-06-13 MED ORDER — BUPIVACAINE HCL (PF) 0.5 % IJ SOLN
INTRAMUSCULAR | Status: AC
  Filled 2016-06-13: qty 10

## 2016-06-13 MED ORDER — ACETAMINOPHEN 325 MG PO TABS: 650.0000 mg | ORAL_TABLET | Freq: Four times a day (QID) | ORAL | Status: DC | PRN

## 2016-06-13 MED ORDER — CEFAZOLIN SODIUM-DEXTROSE 2-4 GM/100ML-% IV SOLN
2.0000 g | Freq: Once | INTRAVENOUS | Status: AC
  Administered 2016-06-13: 2 g via INTRAVENOUS

## 2016-06-13 MED ORDER — DEXAMETHASONE SODIUM PHOSPHATE 10 MG/ML IJ SOLN
INTRAMUSCULAR | Status: AC
  Filled 2016-06-13: qty 1

## 2016-06-13 MED ORDER — DIAZEPAM 2 MG PO TABS
2.0000 mg | ORAL_TABLET | Freq: Three times a day (TID) | ORAL | Status: DC | PRN
  Administered 2016-06-14: 2 mg via ORAL
  Filled 2016-06-13: qty 1

## 2016-06-13 MED ORDER — PROMETHAZINE HCL 6.25 MG/5ML PO SYRP
6.2500 mg | ORAL_SOLUTION | Freq: Four times a day (QID) | ORAL | Status: DC | PRN
  Filled 2016-06-13: qty 5

## 2016-06-13 MED ORDER — SODIUM CHLORIDE 0.9 % IV SOLN
INTRAVENOUS | Status: DC | PRN
  Administered 2016-06-13: 50 ug/min via INTRAVENOUS

## 2016-06-13 MED ORDER — ONDANSETRON HCL 4 MG/2ML IJ SOLN: 4.0000 mg | Freq: Once | INTRAMUSCULAR | Status: DC | PRN

## 2016-06-13 MED ORDER — PHENOL 1.4 % MT LIQD
1.0000 | OROMUCOSAL | Status: DC | PRN
  Filled 2016-06-13: qty 177

## 2016-06-13 MED ORDER — POTASSIUM CHLORIDE CRYS ER 20 MEQ PO TBCR
20.0000 meq | EXTENDED_RELEASE_TABLET | Freq: Every day | ORAL | Status: DC
Start: 2016-06-13 — End: 2016-06-15
  Administered 2016-06-14 – 2016-06-15 (×2): 20 meq via ORAL
  Filled 2016-06-13 (×2): qty 1

## 2016-06-13 MED ORDER — METOPROLOL SUCCINATE ER 50 MG PO TB24
50.0000 mg | ORAL_TABLET | Freq: Every day | ORAL | Status: DC
  Administered 2016-06-14: 50 mg via ORAL
  Filled 2016-06-13 (×2): qty 1

## 2016-06-13 MED ORDER — PREGABALIN 75 MG PO CAPS
225.0000 mg | ORAL_CAPSULE | Freq: Two times a day (BID) | ORAL | Status: DC
  Administered 2016-06-13 – 2016-06-15 (×4): 225 mg via ORAL
  Filled 2016-06-13 (×4): qty 3

## 2016-06-13 MED ORDER — CEFAZOLIN SODIUM-DEXTROSE 2-4 GM/100ML-% IV SOLN
INTRAVENOUS | Status: AC
  Filled 2016-06-13: qty 100

## 2016-06-13 MED ORDER — LOSARTAN POTASSIUM 50 MG PO TABS
100.0000 mg | ORAL_TABLET | Freq: Every day | ORAL | Status: DC
  Administered 2016-06-13 – 2016-06-14 (×2): 100 mg via ORAL
  Filled 2016-06-13 (×3): qty 2

## 2016-06-13 MED ORDER — ONDANSETRON HCL 4 MG/2ML IJ SOLN: 4.0000 mg | Freq: Four times a day (QID) | INTRAMUSCULAR | Status: DC | PRN

## 2016-06-13 MED ORDER — ACETAMINOPHEN 10 MG/ML IV SOLN
INTRAVENOUS | Status: DC | PRN
  Administered 2016-06-13: 1000 mg via INTRAVENOUS

## 2016-06-13 MED ORDER — VITAMIN B-12 1000 MCG PO TABS
1000.0000 ug | ORAL_TABLET | Freq: Every day | ORAL | Status: DC
  Administered 2016-06-14 – 2016-06-15 (×2): 1000 ug via ORAL
  Filled 2016-06-13 (×2): qty 1

## 2016-06-13 MED ORDER — METOPROLOL TARTRATE 50 MG PO TABS
ORAL_TABLET | ORAL | Status: AC
  Filled 2016-06-13: qty 1

## 2016-06-13 MED ORDER — HYDROMORPHONE HCL 1 MG/ML IJ SOLN
INTRAMUSCULAR | Status: AC
  Filled 2016-06-13: qty 1

## 2016-06-13 MED ORDER — CEFAZOLIN SODIUM 10 G IJ SOLR
2.0000 g | Freq: Four times a day (QID) | INTRAMUSCULAR | Status: AC
  Administered 2016-06-13 – 2016-06-14 (×3): 2 g via INTRAVENOUS
  Filled 2016-06-13 (×3): qty 2000

## 2016-06-13 SURGICAL SUPPLY — 47 items
BLADE SAW SAG 18.5X105 (BLADE) ×3 IMPLANT
BNDG COHESIVE 6X5 TAN STRL LF (GAUZE/BANDAGES/DRESSINGS) ×6 IMPLANT
CANISTER SUCT 1200ML W/VALVE (MISCELLANEOUS) ×3 IMPLANT
CAPT HIP TOTAL 3 ×3 IMPLANT
CATH FOL LEG HOLDER (MISCELLANEOUS) ×3 IMPLANT
CATH TRAY METER 16FR LF (MISCELLANEOUS) ×3 IMPLANT
CHLORAPREP W/TINT 26ML (MISCELLANEOUS) ×3 IMPLANT
DRAPE C-ARM XRAY 36X54 (DRAPES) ×3 IMPLANT
DRAPE INCISE IOBAN 66X60 STRL (DRAPES) IMPLANT
DRAPE POUCH INSTRU U-SHP 10X18 (DRAPES) ×3 IMPLANT
DRAPE SHEET LG 3/4 BI-LAMINATE (DRAPES) ×9 IMPLANT
DRAPE TABLE BACK 80X90 (DRAPES) ×3 IMPLANT
DRESSING SURGICEL FIBRLLR 1X2 (HEMOSTASIS) ×2 IMPLANT
DRSG OPSITE POSTOP 4X8 (GAUZE/BANDAGES/DRESSINGS) ×6 IMPLANT
DRSG SURGICEL FIBRILLAR 1X2 (HEMOSTASIS) ×6
ELECT BLADE 6.5 EXT (BLADE) ×3 IMPLANT
ELECT REM PT RETURN 9FT ADLT (ELECTROSURGICAL) ×3
ELECTRODE REM PT RTRN 9FT ADLT (ELECTROSURGICAL) ×1 IMPLANT
GLOVE BIOGEL PI IND STRL 9 (GLOVE) ×1 IMPLANT
GLOVE BIOGEL PI INDICATOR 9 (GLOVE) ×2
GLOVE SURG SYN 9.0  PF PI (GLOVE) ×4
GLOVE SURG SYN 9.0 PF PI (GLOVE) ×2 IMPLANT
GOWN SRG 2XL LVL 4 RGLN SLV (GOWNS) ×1 IMPLANT
GOWN STRL NON-REIN 2XL LVL4 (GOWNS) ×2
GOWN STRL REUS W/ TWL LRG LVL3 (GOWN DISPOSABLE) ×1 IMPLANT
GOWN STRL REUS W/TWL LRG LVL3 (GOWN DISPOSABLE) ×2
HEMOVAC 400CC 10FR (MISCELLANEOUS) IMPLANT
HOOD PEEL AWAY FLYTE STAYCOOL (MISCELLANEOUS) ×3 IMPLANT
MAT BLUE FLOOR 46X72 FLO (MISCELLANEOUS) ×3 IMPLANT
NDL SAFETY 18GX1.5 (NEEDLE) ×3 IMPLANT
NEEDLE SPNL 18GX3.5 QUINCKE PK (NEEDLE) ×3 IMPLANT
NS IRRIG 1000ML POUR BTL (IV SOLUTION) ×3 IMPLANT
PACK HIP COMPR (MISCELLANEOUS) ×3 IMPLANT
PREVENA INCISION MGT 90 150 (MISCELLANEOUS) ×3 IMPLANT
SOL PREP PVP 2OZ (MISCELLANEOUS) ×3
SOLUTION PREP PVP 2OZ (MISCELLANEOUS) ×1 IMPLANT
STAPLER SKIN PROX 35W (STAPLE) ×3 IMPLANT
STRAP SAFETY BODY (MISCELLANEOUS) ×3 IMPLANT
SUT DVC 2 QUILL PDO  T11 36X36 (SUTURE) ×2
SUT DVC 2 QUILL PDO T11 36X36 (SUTURE) ×1 IMPLANT
SUT SILK 0 (SUTURE) ×2
SUT SILK 0 30XBRD TIE 6 (SUTURE) ×1 IMPLANT
SUT V-LOC 90 ABS DVC 3-0 CL (SUTURE) ×3 IMPLANT
SUT VIC AB 1 CT1 36 (SUTURE) ×3 IMPLANT
SYR 20CC LL (SYRINGE) ×3 IMPLANT
SYR 30ML LL (SYRINGE) ×3 IMPLANT
TOWEL OR 17X26 4PK STRL BLUE (TOWEL DISPOSABLE) ×3 IMPLANT
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
## 2016-06-13 NOTE — Progress Notes (Signed)
Patient up first time with nursing staff and walker to recliner. Patient not complaining of any pain at this time.   Deri Fuelling, RN

## 2016-06-13 NOTE — H&P (Signed)
Reviewed paper H+P, will be scanned into chart. Patient examined No changes noted.  

## 2016-06-13 NOTE — Anesthesia Procedure Notes (Signed)
Procedure Name: Intubation Performed by: Rolla Plate Pre-anesthesia Checklist: Patient identified, Patient being monitored, Timeout performed, Emergency Drugs available and Suction available Patient Re-evaluated:Patient Re-evaluated prior to inductionOxygen Delivery Method: Circle system utilized Preoxygenation: Pre-oxygenation with 100% oxygen Intubation Type: IV induction and Rapid sequence Laryngoscope Size: Miller and 2 Grade View: Grade I Tube type: Oral Tube size: 7.0 mm Number of attempts: 1 Airway Equipment and Method: Stylet Placement Confirmation: ETT inserted through vocal cords under direct vision,  positive ETCO2 and breath sounds checked- equal and bilateral Secured at: 21 cm Tube secured with: Tape Dental Injury: Teeth and Oropharynx as per pre-operative assessment

## 2016-06-13 NOTE — Anesthesia Postprocedure Evaluation (Signed)
Anesthesia Post Note  Patient: Wanda Gardner  Procedure(s) Performed: Procedure(s) (LRB): TOTAL HIP ARTHROPLASTY ANTERIOR APPROACH (Right)  Patient location during evaluation: PACU Anesthesia Type: General Level of consciousness: awake and alert Pain management: pain level controlled Vital Signs Assessment: post-procedure vital signs reviewed and stable Respiratory status: spontaneous breathing and respiratory function stable Cardiovascular status: stable Anesthetic complications: no     Last Vitals:  Vitals:   06/13/16 1315 06/13/16 1330  BP: (!) 123/48 (!) 130/92  Pulse: 63 63  Resp: 13 12  Temp:  36.3 C    Last Pain:  Vitals:   06/13/16 1330  TempSrc:   PainSc: Asleep                 KEPHART,WILLIAM K

## 2016-06-13 NOTE — Progress Notes (Signed)
PHARMACIST - PHYSICIAN ORDER COMMUNICATION  CONCERNING: P&T Medication Policy on Herbal & Supplement Medications  DESCRIPTION:  This patient's order for:  Biotin  has been noted.  This product(s) is classified as an "herbal" or natural product. Due to a lack of definitive safety studies or FDA approval, nonstandard manufacturing practices, plus the potential risk of unknown drug-drug interactions while on inpatient medications, the Pharmacy and Therapeutics Committee does not permit the use of "herbal" or natural products of this type within [facility].   ACTION TAKEN: The pharmacy department is unable to verify this order at this time and your patient has been informed of this safety policy. Please reevaluate patient's clinical condition at discharge and address if the herbal or natural product(s) should be resumed at that time.

## 2016-06-13 NOTE — Op Note (Signed)
06/13/2016  12:12 PM  PATIENT:  Grant Ruts  76 y.o. female  PRE-OPERATIVE DIAGNOSIS:  PRIMARY LOCALIZED OSTEOARTHRITIS OF RIGHT HIP  POST-OPERATIVE DIAGNOSIS:  PRIMARY LOCALIZED OSTEOARTHRITIS OF RIGHT HIP  PROCEDURE:  Procedure(s): TOTAL HIP ARTHROPLASTY ANTERIOR APPROACH (Right)  SURGEON: Laurene Footman, MD  ASSISTANTS: none  ANESTHESIA:   general  EBL:  Total I/O In: 1300 [I.V.:1300] Out: 750 [Urine:350; Blood:400]  BLOOD ADMINISTERED:none  DRAINS: (2) Hemovact drain(s) in the subcutaneous layer with  Suction Open   LOCAL MEDICATIONS USED:  MARCAINE     SPECIMEN:  Source of Specimen:  right femoral head  DISPOSITION OF SPECIMEN:  PATHOLOGY  COUNTS:  YES  TOURNIQUET:  * No tourniquets in log *  IMPLANTS: Medacta Amis 3 standard stem with 52 mm Mpact DM cup and liner with a S 28 mm head  DICTATION: .Dragon Dictation   The patient was brought to the operating room and after general anesthesia was obtained patient was placed on the operative table with the ipsilateral foot into the Medacta attachment, contralateral leg on a well-padded table. C-arm was brought in and preop template x-ray taken. After prepping and draping in usual sterile fashion appropriate patient identification and timeout procedures were completed. Anterior approach to the hip was obtained and centered over the greater trochanter and TFL muscle. The subcutaneous tissue was incised hemostasis being achieved by electrocautery. TFL fascia was incised and the muscle retracted laterally deep retractor placed. The lateral femoral circumflex vessels were identified and ligated. The anterior capsule was exposed and a capsulotomy performed. The neck was identified and a femoral neck cut carried out with a saw. The head was removed without difficulty and showed sclerotic femoral head and acetabulum. Reaming was carried out to 52 mm and a 52 mm cup trial gave appropriate tightness to the acetabular component a 52 DM  cup was impacted into position. The leg was then externally rotated and ischiofemoral and pubofemoral releases carried out. The femur was sequentially broached to a size 3, size 3 standard width S head trials were placed and the final components chosen. The 3 standard stem was inserted along with a S 28 mm head and 52 mm liner. The hip was reduced and was stable the wound was thoroughly irrigated with a dilute Betadine solution. The deep fascia view. Using a heavy Quill after infiltration of 30 cc of quarter percent Sensorcaine with epinephrine. Subcutaneous drains were then inserted, 3-0 v-loc to close the skin with skin staples Provena incisional wound VAC was then applied over the incision with a drain sponge and tape over the drains  PLAN OF CARE: Admit to inpatient

## 2016-06-13 NOTE — Anesthesia Post-op Follow-up Note (Cosign Needed)
Anesthesia QCDR form completed.        

## 2016-06-13 NOTE — NC FL2 (Signed)
[place] LEVEL OF CARE SCREENING TOOL     IDENTIFICATION  Patient Name: Wanda Gardner Birthdate: [birth year] Sex: female Admission Date (Current Location): 06/13/2016  [facility] and [place] Number:  Selena Lesser  ([id number]) Facility and Address:  [facility], [address], [place], [place] [address]      Provider Number: [id number]  Attending Physician Name and Address:  Hessie Knows, MD  Relative Name and Phone Number:       Current Level of Care: Hospital Recommended Level of Care: [place] Prior Approval Number:    Date Approved/Denied:   PASRR Number:  ([id number] )  Discharge Plan: SNF    Current Diagnoses: Patient Active Problem List   Diagnosis Date Noted  . Primary osteoarthritis of right hip 06/13/2016  . Primary localized osteoarthritis of right knee 11/23/2015  . Stress incontinence, female 08/19/2014  . Cephalalgia 07/25/2011  . Central retinal vein occlusion 03/31/2011  . Dry eye syndrome 03/31/2011  . Glaucoma associated with vascular disorder 05/23/2010  . 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 Artificial lens present 03/22/2010  . Chronic glaucoma 03/22/2010  . Cataract, nuclear sclerotic senile 03/22/2010  . Diabetes mellitus type 2, uncontrolled ([place]) 07/26/2007  . Encounter for general adult medical examination without abnormal findings 12/23/2003  . Polypharmacy 02/03/2003    Orientation RESPIRATION BLADDER Height & Weight     Self, Time, Situation, Place  O2 (2 Liters Oxygen ) Continent Weight:   Height:     BEHAVIORAL SYMPTOMS/MOOD NEUROLOGICAL BOWEL NUTRITION STATUS   (none)  (none) Continent Diet (Diet: Carb Modified )  AMBULATORY STATUS COMMUNICATION OF NEEDS Skin   Extensive Assist Verbally Surgical wounds, Wound Vac (Incision: Right Hip. Provena wound vac. )                       Personal Care Assistance Level of Assistance  Bathing,  Feeding, Dressing Bathing Assistance: Limited assistance Feeding assistance: Independent Dressing Assistance: Limited assistance     Functional Limitations Info  Sight, Hearing, Speech Sight Info: Adequate Hearing Info: Adequate Speech Info: Adequate    SPECIAL CARE FACTORS FREQUENCY  PT (By licensed PT), OT (By licensed OT)     PT Frequency:  (5) OT Frequency:  (5)            Contractures      Additional Factors Info  Code Status, Allergies, Insulin Sliding Scale Code Status Info:  (Full Code. ) Allergies Info:  (No Known Allergies. )   Insulin Sliding Scale Info:  (NovoLog Insulin Injections. )       Current Medications (06/13/2016):  This is the current hospital active medication list Current Facility-Administered Medications  Medication Dose Route Frequency Provider Last Rate Last Dose  . 0.9 %  sodium chloride infusion   Intravenous Continuous Hessie Knows, MD      . acetaminophen (TYLENOL) tablet 650 mg  650 mg Oral Q6H PRN Hessie Knows, MD       Or  . acetaminophen (TYLENOL) suppository 650 mg  650 mg Rectal Q6H PRN Hessie Knows, MD      . albuterol (PROVENTIL HFA;VENTOLIN HFA) 108 (90 Base) MCG/ACT inhaler 2 puff  2 puff Inhalation Q6H PRN Hessie Knows, MD      . alum & mag hydroxide-simeth (MAALOX/MYLANTA) 200-200-20 MG/5ML suspension 30 mL  30 mL Oral Q4H PRN Hessie Knows, MD      . aspirin EC tablet 81 mg  81 mg Oral  Daily Hessie Knows, MD      . Biotin 1 mg  1 tablet Oral Daily Hessie Knows, MD      . bisacodyl (DULCOLAX) suppository 10 mg  10 mg Rectal Daily PRN Hessie Knows, MD      . brinzolamide (AZOPT) 1 % ophthalmic suspension 1 drop  1 drop Both Eyes BID Hessie Knows, MD      . ceFAZolin (ANCEF) IVPB 2g/100 mL premix  2 g Intravenous Q6H Hessie Knows, MD      . clobetasol (TEMOVATE) 0.05 % external solution 1 application  1 application Topical BID Hessie Knows, MD      . diazepam (VALIUM) tablet 2 mg  2 mg Oral Q8H PRN Hessie Knows, MD      .  diphenhydrAMINE (BENADRYL) 12.5 MG/5ML elixir 12.5-25 mg  12.5-25 mg Oral Q4H PRN Hessie Knows, MD      . docusate sodium (COLACE) capsule 100 mg  100 mg Oral BID Hessie Knows, MD      . docusate sodium (COLACE) capsule 100 mg  100 mg Oral BID Hessie Knows, MD      . DULoxetine (CYMBALTA) DR capsule 60 mg  60 mg Oral QHS Hessie Knows, MD      . Derrill Memo ON 06/14/2016] enoxaparin (LOVENOX) injection 40 mg  40 mg Subcutaneous Q24H Hessie Knows, MD      . famotidine (PEPCID) 20 MG tablet           . [START ON 06/14/2016] ferrous sulfate tablet 325 mg  325 mg Oral Q breakfast Hessie Knows, MD      . fluticasone [facility]) 50 MCG/ACT nasal spray 2 spray  2 spray Each Nare BID Hessie Knows, MD      . furosemide (LASIX) tablet 40 mg  40 mg Oral Daily Hessie Knows, MD      . insulin aspart (novoLOG) injection 0-15 Units  0-15 Units Subcutaneous TID WC Hessie Knows, MD      . insulin aspart (novoLOG) injection 4 Units  4 Units Subcutaneous TID WC Hessie Knows, MD      . Insulin Glargine (LANTUS) Solostar Pen 45 Units  45 Units Subcutaneous Q2200 Hessie Knows, MD      . losartan-hydrochlorothiazide [facility]) 100-12.5 MG per tablet 1 tablet  1 tablet Oral Daily Hessie Knows, MD      . magnesium citrate solution 1 Bottle  1 Bottle Oral Once PRN Hessie Knows, MD      . magnesium hydroxide (MILK OF MAGNESIA) suspension 30 mL  30 mL Oral Daily PRN Hessie Knows, MD      . menthol-cetylpyridinium (CEPACOL) lozenge 3 mg  1 lozenge Oral PRN Hessie Knows, MD       Or  . phenol (CHLORASEPTIC) mouth spray 1 spray  1 spray Mouth/Throat PRN Hessie Knows, MD      . metFORMIN (GLUCOPHAGE) tablet 500 mg  500 mg Oral BID Hessie Knows, MD      . metoCLOPramide (REGLAN) tablet 5-10 mg  5-10 mg Oral Q8H PRN Hessie Knows, MD       Or  . metoCLOPramide (REGLAN) injection 5-10 mg  5-10 mg Intravenous Q8H PRN Hessie Knows, MD      . metoprolol (LOPRESSOR) 50 MG tablet           . metoprolol succinate (TOPROL-XL) 24 hr tablet 50 mg   50 mg Oral Daily Hessie Knows, MD      . morphine 2 MG/ML injection 2 mg  2 mg Intravenous Q1H  PRN Hessie Knows, MD      . naproxen (NAPROSYN) tablet 500 mg  500 mg Oral Daily Hessie Knows, MD      . ondansetron [facility]) tablet 4 mg  4 mg Oral Q6H PRN Hessie Knows, MD       Or  . ondansetron [facility]) injection 4 mg  4 mg Intravenous Q6H PRN Hessie Knows, MD      . oxyCODONE (Oxy IR/ROXICODONE) immediate release tablet 5-10 mg  5-10 mg Oral Q3H PRN Hessie Knows, MD      . pantoprazole (PROTONIX) EC tablet 80 mg  80 mg Oral Q1200 Hessie Knows, MD      . potassium chloride SA (K-DUR,KLOR-CON) CR tablet 20 mEq  20 mEq Oral Daily Hessie Knows, MD      . pravastatin (PRAVACHOL) tablet 40 mg  40 mg Oral QHS Hessie Knows, MD      . pregabalin (LYRICA) capsule 225 mg  225 mg Oral BID Hessie Knows, MD      . promethazine-dextromethorphan (PROMETHAZINE-DM) 6.25-15 MG/5ML syrup 5 mL  5 mL Oral QID PRN Hessie Knows, MD      . vitamin B-12 (CYANOCOBALAMIN) tablet 1,000 mcg  1,000 mcg Oral Daily Hessie Knows, MD      . Vitamin D3 CAPS 5,000 Units  5,000 Units Oral Daily Hessie Knows, MD      . vitamin E capsule 400 Units  400 Units Oral Daily Hessie Knows, MD         Discharge Medications: Please see discharge summary for a list of discharge medications.  Relevant Imaging Results:  Relevant Lab Results:   Additional Information  (SSN: [id number])  Rhapsody Wolven, Veronia Beets, LCSW

## 2016-06-13 NOTE — Transfer of Care (Signed)
Immediate Anesthesia Transfer of Care Note  Patient: Wanda Gardner  Procedure(s) Performed: Procedure(s): TOTAL HIP ARTHROPLASTY ANTERIOR APPROACH (Right)  Patient Location: PACU  Anesthesia Type:General  Level of Consciousness: sedated  Airway & Oxygen Therapy: Patient Spontanous Breathing and Patient connected to face mask oxygen  Post-op Assessment: Report given to RN and Post -op Vital signs reviewed and stable  Post vital signs: Reviewed  Last Vitals:  Vitals:   06/13/16 1212 06/13/16 1213  BP: (!) 111/47 (!) 111/47  Pulse: 61 61  Resp: 13 13  Temp: 37.1 C 37.1 C    Last Pain:  Vitals:   06/13/16 0809  TempSrc: Oral  PainSc: 4          Complications: No apparent anesthesia complications

## 2016-06-13 NOTE — Progress Notes (Signed)
PT Cancellation Note  Patient Details Name: Wanda Gardner MRN: [id number] DOB: 1941/02/18   Cancelled Treatment:    Reason Eval/Treat Not Completed: Patient's level of consciousness;Fatigue/lethargy limiting ability to participate (Pt somnolent unable to give full DOB prior to falling asleep again. Will attempt again at later date/time as appropriate. )    Mae Denunzio C 06/13/2016, 3:49 PM  3:49 PM, 06/13/16 Etta Grandchild, PT, DPT Physical Therapist - [place] [phone] (mobile)

## 2016-06-13 NOTE — Anesthesia Preprocedure Evaluation (Signed)
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 on medications and Pt. on home beta blockers + Peripheral Vascular Disease       Neuro/Psych Depression negative neurological ROS     GI/Hepatic Neg liver ROS, GERD  Medicated,  Endo/Other  diabetes, Type 2, Oral Hypoglycemic Agents  Renal/GU negative Renal ROS     Musculoskeletal  (+) Arthritis , Osteoarthritis,    Abdominal   Peds  Hematology negative hematology ROS (+)   Anesthesia Other Findings   Reproductive/Obstetrics                             Anesthesia Physical Anesthesia Plan  ASA: III  Anesthesia Plan: Spinal   Post-op Pain Management:    Induction:   Airway Management Planned:   Additional Equipment:   Intra-op Plan:   Post-operative Plan:   Informed Consent: I have reviewed the patients History and Physical, chart, labs and discussed the procedure including the risks, benefits and alternatives for the proposed anesthesia with the patient or authorized representative who has indicated his/her understanding and acceptance.     Plan Discussed with:   Anesthesia Plan Comments:         Anesthesia Quick Evaluation

## 2016-06-14 ENCOUNTER — Encounter: Payer: Self-pay | Admitting: Orthopedic Surgery

## 2016-06-14 LAB — CBC
HCT: 31.9 % — ABNORMAL LOW (ref 35.0–47.0)
HEMOGLOBIN: 10.4 g/dL — AB (ref 12.0–16.0)
MCH: 28 pg (ref 26.0–34.0)
MCHC: 32.7 g/dL (ref 32.0–36.0)
MCV: 85.6 fL (ref 80.0–100.0)
Platelets: 248 10*3/uL (ref 150–440)
RBC: 3.73 MIL/uL — AB (ref 3.80–5.20)
RDW: 15.2 % — ABNORMAL HIGH (ref 11.5–14.5)
WBC: 9.4 10*3/uL (ref 3.6–11.0)

## 2016-06-14 LAB — GLUCOSE, CAPILLARY
GLUCOSE-CAPILLARY: 243 mg/dL — AB (ref 65–99)
GLUCOSE-CAPILLARY: 142 mg/dL — AB (ref 65–99)
Glucose-Capillary: 278 mg/dL — ABNORMAL HIGH (ref 65–99)
Glucose-Capillary: 225 mg/dL — ABNORMAL HIGH (ref 65–99)

## 2016-06-14 LAB — BASIC METABOLIC PANEL
ANION GAP: 9 (ref 5–15)
BUN: 22 mg/dL — ABNORMAL HIGH (ref 6–20)
CHLORIDE: 100 mmol/L — AB (ref 101–111)
CO2: 25 mmol/L (ref 22–32)
Calcium: 8.7 mg/dL — ABNORMAL LOW (ref 8.9–10.3)
Creatinine, Ser: 1.06 mg/dL — ABNORMAL HIGH (ref 0.44–1.00)
GFR calc Af Amer: 58 mL/min — ABNORMAL LOW (ref >60)
GFR, EST NON AFRICAN AMERICAN: 50 mL/min — AB (ref >60)
GLUCOSE: 282 mg/dL — AB (ref 65–99)
Potassium: 4.3 mmol/L (ref 3.5–5.1)
Sodium: 134 mmol/L — ABNORMAL LOW (ref 135–145)

## 2016-06-14 NOTE — Evaluation (Signed)
Physical Therapy Evaluation Patient Details Name: Wanda Gardner MRN: [id number] DOB: 1940-07-04 Today's Date: 06/14/2016   History of Present Illness  Pt is a 76 y/o F s/p R THA.  Pt's PMH includes R TKA, COPD, depression, neuropathic pain of hands and feet, glaucoma.    Clinical Impression  Pt is s/p R THA resulting in the deficits listed below (see PT Problem List). Wanda Gardner was Ind using cane PTA.  She currently requires supervision for transfers and min guard assist when ambulating in hallway.  Cues for posture and forward gaze when ambulating. Pt will benefit from skilled PT to increase their independence and safety with mobility to allow discharge to the venue listed below.     Follow Up Recommendations Home health PT    Equipment Recommendations  None recommended by PT    Recommendations for Other Services OT consult     Precautions / Restrictions Precautions Precautions: Fall Restrictions Weight Bearing Restrictions: Yes RLE Weight Bearing: Weight bearing as tolerated      Mobility  Bed Mobility               General bed mobility comments: Pt sitting in chair upon PT arrival  Transfers Overall transfer level: Needs assistance Equipment used: Rolling walker (2 wheeled) Transfers: Sit to/from Stand Sit to Stand: Supervision         General transfer comment: Supervision for safety.  Pt demonstrates proper hand placement without cues but is slow to stand.  Ambulation/Gait Ambulation/Gait assistance: Min guard Ambulation Distance (Feet): 125 Feet Assistive device: Rolling walker (2 wheeled) Gait Pattern/deviations: Step-through pattern;Decreased stride length;Decreased stance time - right;Decreased weight shift to right;Decreased step length - left;Antalgic;Trunk flexed Gait velocity: decreased Gait velocity interpretation: Below normal speed for age/gender General Gait Details: Cues for upright posture and forward gaze and for increased speed as pt  initiallly with very decreased gait speed as pt guarding R hip.  She requires some verbal cues to maneuver RW around objects in hallway.   Stairs            Wheelchair Mobility    Modified Rankin (Stroke Patients Only)       Balance Overall balance assessment: Needs assistance Sitting-balance support: No upper extremity supported;Feet supported Sitting balance-Leahy Scale: Good     Standing balance support: No upper extremity supported;During functional activity Standing balance-Leahy Scale: Fair Standing balance comment: Pt able to stand statically without UE support but requires RW for dynamic activities                             Pertinent Vitals/Pain Pain Assessment: Faces Faces Pain Scale: Hurts little more Pain Location: R hip with mobility Pain Descriptors / Indicators: Grimacing;Guarding Pain Intervention(s): Limited activity within patient's tolerance;Monitored during session    Home Living Family/patient expects to be discharged to:: Private residence Living Arrangements: Alone Available Help at Discharge: Family;Available PRN/intermittently Type of Home: House Home Access: Stairs to enter Entrance Stairs-Rails: Left Entrance Stairs-Number of Steps: 3 Home Layout: One level Home Equipment: Cane - single point;Walker - 2 wheels (has standard RW and bari RW)      Prior Function Level of Independence: Independent         Comments: Pt reports she uses her cane outside her home.  Reports 1 fall in the past 6 months where she said she found herself on the floor of her shower and she was not sure how she got there.  Hand Dominance        Extremity/Trunk Assessment   Upper Extremity Assessment Upper Extremity Assessment: Overall WFL for tasks assessed    Lower Extremity Assessment Lower Extremity Assessment: RLE deficits/detail RLE Deficits / Details: Hip flexion 3-/5, knee extension 3/5, knee flexion 4/5, otherwise strength grossly  4/5    Cervical / Trunk Assessment Cervical / Trunk Assessment: Kyphotic  Communication   Communication: No difficulties  Cognition Arousal/Alertness: Awake/alert Behavior During Therapy: WFL for tasks assessed/performed Overall Cognitive Status: Within Functional Limits for tasks assessed                                        General Comments      Exercises Total Joint Exercises Knee Flexion: AROM;Right;10 reps;Standing;Other (comment) (for gentle stretch) General Exercises - Lower Extremity Ankle Circles/Pumps: AROM;Both;10 reps;Seated Quad Sets: Strengthening;Both;10 reps;Seated Long Arc Quad: Strengthening;Both;10 reps;Seated Hip ABduction/ADduction: Strengthening;Right;10 reps;Seated Straight Leg Raises: AAROM;Right;10 reps;Seated   Assessment/Plan    PT Assessment Patient needs continued PT services  PT Problem List Decreased strength;Decreased range of motion;Decreased balance;Decreased knowledge of use of DME;Decreased safety awareness;Pain       PT Treatment Interventions DME instruction;Gait training;Stair training;Functional mobility training;Therapeutic activities;Therapeutic exercise;Balance training;Neuromuscular re-education;Patient/family education;Modalities    PT Goals (Current goals can be found in the Care Plan section)  Acute Rehab PT Goals Patient Stated Goal: to go home PT Goal Formulation: With patient Time For Goal Achievement: 06/28/16 Potential to Achieve Goals: Good    Frequency BID   Barriers to discharge        Co-evaluation               End of Session Equipment Utilized During Treatment: Gait belt Activity Tolerance: Patient tolerated treatment well Patient left: in chair;with call bell/phone within reach;with chair alarm set Nurse Communication: Mobility status PT Visit Diagnosis: Pain;Muscle weakness (generalized) (M62.81);Unsteadiness on feet (R26.81) Pain - Right/Left: Right Pain - part of body: Hip     Time: 3662-9476 PT Time Calculation (min) (ACUTE ONLY): 24 min   Charges:   PT Evaluation $PT Eval Low Complexity: 1 Procedure PT Treatments $Gait Training: 8-22 mins   PT G Codes:        Collie Siad PT, DPT 06/14/2016, 11:00 AM

## 2016-06-14 NOTE — Plan of Care (Signed)
Problem: Safety: Goal: Ability to remain free from injury will improve Outcome: Progressing Pt will ring out for assistance. Pt knows no to get up on her own.  Problem: Pain Managment: Goal: General experience of comfort will improve Outcome: Progressing Pain control with oral medications.  Problem: Skin Integrity: Goal: Risk for impaired skin integrity will decrease Outcome: Progressing Surgical dressing remaining dry and intact.  Problem: Activity: Goal: Risk for activity intolerance will decrease Outcome: Progressing Was able to sit in bedside chair this evening.  Problem: Nutrition: Goal: Adequate nutrition will be maintained Outcome: Progressing Eating and drinking without difficulty.

## 2016-06-14 NOTE — Progress Notes (Signed)
Physical Therapy Treatment Patient Details Name: Wanda Gardner MRN: [id number] DOB: November 19, 1940 Today's Date: 06/14/2016    History of Present Illness Pt is a 76 y/o F s/p R THA.  Pt's PMH includes R TKA, COPD, depression, neuropathic pain of hands and feet, glaucoma.    PT Comments    Wanda Gardner is making excellent progress with mobility and ambulated 200 ft with RW and cues for upright posture.  She tolerated all therapeutic exercises well.  She will need to practice stairs before d/c.  Pt will benefit from continued skilled PT services to increase functional independence and safety.    Follow Up Recommendations  Home health PT     Equipment Recommendations  None recommended by PT    Recommendations for Other Services OT consult     Precautions / Restrictions Precautions Precautions: Fall Restrictions Weight Bearing Restrictions: Yes RLE Weight Bearing: Weight bearing as tolerated    Mobility  Bed Mobility Overal bed mobility: Modified Independent             General bed mobility comments: No physical assist or cues needed.  Slightly increased time.  Transfers Overall transfer level: Needs assistance Equipment used: Rolling walker (2 wheeled) Transfers: Sit to/from Stand Sit to Stand: Supervision         General transfer comment: Supervision for safety.  Pt attempts to back up to chair without RW and requires cues for safe technique.  Ambulation/Gait Ambulation/Gait assistance: Min guard Ambulation Distance (Feet): 200 Feet Assistive device: Rolling walker (2 wheeled) Gait Pattern/deviations: Step-through pattern;Decreased stride length;Decreased stance time - right;Decreased weight shift to right;Decreased step length - left;Antalgic;Trunk flexed Gait velocity: decreased Gait velocity interpretation: Below normal speed for age/gender General Gait Details: Cues for upright posture and forward gaze and to relax shoulders.     Stairs             Wheelchair Mobility    Modified Rankin (Stroke Patients Only)       Balance Overall balance assessment: Needs assistance Sitting-balance support: No upper extremity supported;Feet supported Sitting balance-Leahy Scale: Good     Standing balance support: No upper extremity supported;During functional activity Standing balance-Leahy Scale: Fair Standing balance comment: Pt able to stand statically without UE support but requires RW for dynamic activities                            Cognition Arousal/Alertness: Awake/alert Behavior During Therapy: WFL for tasks assessed/performed Overall Cognitive Status: Within Functional Limits for tasks assessed                                        Exercises Total Joint Exercises Knee Flexion: AROM;Right;10 reps;Standing;Other (comment) (for gentle stretch) General Exercises - Lower Extremity Long Arc Quad: Strengthening;Both;10 reps;Seated Hip ABduction/ADduction: Strengthening;Both;10 reps;Seated;Other (comment) (seated adduction with pillow and 3 second holds) Hip Flexion/Marching: Both;10 reps;Seated    General Comments        Pertinent Vitals/Pain Pain Assessment: Faces Faces Pain Scale: Hurts a little bit Pain Location: R hip with mobility Pain Descriptors / Indicators: Grimacing;Guarding Pain Intervention(s): Limited activity within patient's tolerance;Monitored during session;Repositioned    Home Living                      Prior Function            PT Goals (  current goals can now be found in the care plan section) Acute Rehab PT Goals Patient Stated Goal: to go home PT Goal Formulation: With patient Time For Goal Achievement: 06/28/16 Potential to Achieve Goals: Good Progress towards PT goals: Progressing toward goals    Frequency    BID      PT Plan Current plan remains appropriate    Co-evaluation             End of Session Equipment Utilized During  Treatment: Gait belt Activity Tolerance: Patient tolerated treatment well Patient left: in chair;with call bell/phone within reach;with chair alarm set Nurse Communication: Mobility status PT Visit Diagnosis: Pain;Muscle weakness (generalized) (M62.81);Unsteadiness on feet (R26.81) Pain - Right/Left: Right Pain - part of body: Hip     Time: 0814-4818 PT Time Calculation (min) (ACUTE ONLY): 19 min  Charges:  $Gait Training: 8-22 mins                    G Codes:       Collie Siad PT, DPT 06/14/2016, 1:47 PM

## 2016-06-14 NOTE — Progress Notes (Signed)
Clinical Social Worker (CSW) received SNF consult. PT is recommending home health. RN case manager aware of above. Please reconsult if future social work needs arise. CSW signing off.   Jearld Hemp, LCSW [phone] 

## 2016-06-14 NOTE — Care Management Note (Signed)
Case Management Note  Patient Details  Name: Wanda Gardner MRN: [id number] Date of Birth: 1940/06/20  Subjective/Objective:   POD # 1 right THA.                Met with patient at bedside regarding home health PT recommendation. Offered choice of home health agencies. She would like to use the one she used previously, [name]. Referral to [name] for HHPT. Patient has rolling walker, needs a  bedside commode which has been ordered from [facility].  [phone] PCP is Dr. NWolfgang [place]      Action/Plan: [name] for HHPT, [facility] from [facility]  Expected Discharge Date:                  [place] Referral:     Discharge planning Services  CM Consult  Post Acute Care Choice:  Durable Medical Equipment, Home Health Choice offered to:  Patient  DME Arranged:  Bedside commode [facility]ME [facility]  [facility] [place]  [name]  Status of Service:  In process, will continue to follow  If discussed at [name], dates discussed:    Additional Comments:  LJolly Mango RN 06/14/2016, 2:26 PM

## 2016-06-14 NOTE — Progress Notes (Signed)
Inpatient Diabetes Program Recommendations  AACE/ADA: New Consensus Statement on Inpatient Glycemic Control (2015)  Target Ranges:  Prepandial:   less than 140 mg/dL      Peak postprandial:   less than 180 mg/dL (1-2 hours)      Critically ill patients:  140 - 180 mg/dL  Results for Wanda Gardner, Wanda Gardner (MRN [id number]) as of 06/14/2016 10:14  Ref. Range 06/13/2016 08:00 06/13/2016 12:17 06/13/2016 16:51 06/13/2016 21:15 06/14/2016 07:41  Glucose-Capillary Latest Ref Range: 65 - 99 mg/dL 173 (H) 207 (H) 198 (H) 279 (H) 278 (H)    Review of Glycemic Control  Diabetes history: DM2 Outpatient Diabetes medications: Lantus 45 units QHS, Novolog 0-15 units TID with meals, Metformin 500 mg BID Current orders for Inpatient glycemic control: Lantus 45 units QHS, Novolog 0-15 units TID with meals, Novolog 4 units TID with meals, Metformin 500 mg BID  Inpatient Diabetes Program Recommendations: Correction (SSI): Please consider ordering Novolog 0-5 units QHS for bedtime correction. A1C: Please consider ordering an A1C to evaluate glycemic control over the past 2-3 months.  Thanks, Barnie Alderman, RN, MSN, CDE Diabetes Coordinator Inpatient Diabetes Program [phone] (Team Pager from 8am to 5pm)

## 2016-06-14 NOTE — Progress Notes (Signed)
   Subjective: 1 Day Post-Op Procedure(s) (LRB): TOTAL HIP ARTHROPLASTY ANTERIOR APPROACH (Right) Patient reports pain as mild.   Patient is well, and has had no acute complaints or problems Denies any CP, SOB, ABD pain. We will continue therapy today.  Plan is to go Home after hospital stay.  Objective: Vital signs in last 24 hours: Temp:  [97.2 F (36.2 C)-98.8 F (37.1 C)] 98.6 F (37 C) (04/25 0748) Pulse Rate:  [61-99] 64 (04/25 0748) Resp:  [11-18] 18 (04/25 0748) BP: (105-154)/(41-92) 123/56 (04/25 0748) SpO2:  [98 %-100 %] 100 % (04/25 0748) Weight:  [104.3 kg (229 lb 15 oz)] 104.3 kg (229 lb 15 oz) (04/24 1437)  Intake/Output from previous day: 04/24 0701 - 04/25 0700 In: 3108.3 [P.O.:240; I.V.:2508.3; IV Piggyback:360] Out: 2660 [Urine:2150; Drains:110; Blood:400] Intake/Output this shift: No intake/output data recorded.   Recent Labs  06/13/16 1503  HGB 11.6*    Recent Labs  06/13/16 1503  WBC 10.6  RBC 4.05  HCT 34.8*  PLT 278    Recent Labs  06/13/16 1503  CREATININE 1.25*   No results for input(s): LABPT, INR in the last 72 hours.  EXAM General - Patient is Alert, Appropriate and Oriented Extremity - Neurovascular intact Sensation intact distally Intact pulses distally Dorsiflexion/Plantar flexion intact No cellulitis present Compartment soft Dressing - dressing C/D/I and no drainage, wound vac and hemovac intact Motor Function - intact, moving foot and toes well on exam.   Past Medical History:  Diagnosis Date  . Acid reflux   . Arthritis   . Asthma   . COPD (chronic obstructive pulmonary disease) ([place])   . Depression   . Diabetes mellitus without complication ([place])   . Dyspnea   . Glaucoma   . Headache    right side of head  . Hyperlipidemia   . Hypertension   . Neuropathic pain of hands and feet   . PONV (postoperative nausea and vomiting) 1982   back with my hysterectomy.  . Sleep apnea     Assessment/Plan:   1 Day  Post-Op Procedure(s) (LRB): TOTAL HIP ARTHROPLASTY ANTERIOR APPROACH (Right) Active Problems:   Primary osteoarthritis of right hip   Acute post op blood loss anemia   Estimated body mass index is 36.01 kg/m as calculated from the following:   Height as of this encounter: 5\' 7"  (1.702 m).   Weight as of this encounter: 104.3 kg (229 lb 15 oz). Advance diet Up with therapy  Needs BM Acute post op blood loss anemia - Hgb 11.6, continue with Iron. Recheck Hgb in the am BMP pending CM to assist with discharge   DVT Prophylaxis - Lovenox, Foot Pumps and TED hose Weight-Bearing as tolerated to right leg   T. Rachelle Hora, PA-C [place] 06/14/2016, 8:32 AM

## 2016-06-15 LAB — BASIC METABOLIC PANEL
ANION GAP: 6 (ref 5–15)
BUN: 27 mg/dL — ABNORMAL HIGH (ref 6–20)
CALCIUM: 8.8 mg/dL — AB (ref 8.9–10.3)
CO2: 30 mmol/L (ref 22–32)
Chloride: 102 mmol/L (ref 101–111)
Creatinine, Ser: 1.12 mg/dL — ABNORMAL HIGH (ref 0.44–1.00)
GFR, EST AFRICAN AMERICAN: 55 mL/min — AB (ref >60)
GFR, EST NON AFRICAN AMERICAN: 47 mL/min — AB (ref >60)
Glucose, Bld: 153 mg/dL — ABNORMAL HIGH (ref 65–99)
Potassium: 3.9 mmol/L (ref 3.5–5.1)
Sodium: 138 mmol/L (ref 135–145)

## 2016-06-15 LAB — CBC
HEMATOCRIT: 30.8 % — AB (ref 35.0–47.0)
Hemoglobin: 10.3 g/dL — ABNORMAL LOW (ref 12.0–16.0)
MCH: 28.5 pg (ref 26.0–34.0)
MCHC: 33.6 g/dL (ref 32.0–36.0)
MCV: 84.8 fL (ref 80.0–100.0)
PLATELETS: 244 10*3/uL (ref 150–440)
RBC: 3.63 MIL/uL — ABNORMAL LOW (ref 3.80–5.20)
RDW: 15.2 % — AB (ref 11.5–14.5)
WBC: 7.4 10*3/uL (ref 3.6–11.0)

## 2016-06-15 LAB — GLUCOSE, CAPILLARY
GLUCOSE-CAPILLARY: 142 mg/dL — AB (ref 65–99)
GLUCOSE-CAPILLARY: 169 mg/dL — AB (ref 65–99)

## 2016-06-15 LAB — SURGICAL PATHOLOGY

## 2016-06-15 MED ORDER — FLEET ENEMA 7-19 GM/118ML RE ENEM
1.0000 | ENEMA | Freq: Once | RECTAL | Status: AC
  Administered 2016-06-15: 1 via RECTAL

## 2016-06-15 MED ORDER — DOCUSATE SODIUM 100 MG PO CAPS: 100.0000 mg | ORAL_CAPSULE | Freq: Two times a day (BID) | ORAL | 0 refills | Status: DC

## 2016-06-15 MED ORDER — ENOXAPARIN SODIUM 40 MG/0.4ML ~~LOC~~ SOLN: 40.0000 mg | SUBCUTANEOUS | 0 refills | Status: DC

## 2016-06-15 MED ORDER — OXYCODONE HCL 5 MG PO TABS: 5.0000 mg | ORAL_TABLET | ORAL | 0 refills | Status: DC | PRN

## 2016-06-15 NOTE — Care Management Note (Signed)
Case Management Note  Patient Details  Name: Wanda Gardner MRN: [id number] Date of Birth: 1940/06/17  Subjective/Objective:   Discharging today               Action/Plan: Cost of Lovenox is $ 1.25. Patient updated. [name] notified of discharge.  Expected Discharge Date:  06/15/16               Expected Discharge Plan:  [place]  In-House Referral:     Discharge planning Services  CM Consult  Post Acute Care Choice:  Durable Medical Equipment, Home Health Choice offered to:  Patient  DME Arranged:  Bedside commode DME Agency:  [place]:  PT [place]:  [facility] (now [name])  Status of Service:  Completed, signed off  If discussed at [place] of Stay Meetings, dates discussed:    Additional Comments:  Jolly Mango, RN 06/15/2016, 9:01 AM

## 2016-06-15 NOTE — Progress Notes (Signed)
   Subjective: 2 Days Post-Op Procedure(s) (LRB): TOTAL HIP ARTHROPLASTY ANTERIOR APPROACH (Right) Patient reports pain as mild.   Patient is well, and has had no acute complaints or problems Denies any CP, SOB, ABD pain. We will continue therapy today.  Plan is to go Home after hospital stay.  Objective: Vital signs in last 24 hours: Temp:  [98.3 F (36.8 C)-99.3 F (37.4 C)] 99.3 F (37.4 C) (04/26 0731) Pulse Rate:  [59-63] 63 (04/26 0731) Resp:  [18] 18 (04/25 2303) BP: (102-107)/(39-51) 107/39 (04/26 0731) SpO2:  [90 %-96 %] 90 % (04/26 0731)  Intake/Output from previous day: 04/25 0701 - 04/26 0700 In: 1870 [P.O.:720; I.V.:1150] Out: 156 [Urine:1; Drains:155] Intake/Output this shift: No intake/output data recorded.   Recent Labs  06/13/16 1503 06/14/16 0822 06/15/16 0514  HGB 11.6* 10.4* 10.3*    Recent Labs  06/14/16 0822 06/15/16 0514  WBC 9.4 7.4  RBC 3.73* 3.63*  HCT 31.9* 30.8*  PLT 248 244    Recent Labs  06/14/16 0822 06/15/16 0514  NA 134* 138  K 4.3 3.9  CL 100* 102  CO2 25 30  BUN 22* 27*  CREATININE 1.06* 1.12*  GLUCOSE 282* 153*  CALCIUM 8.7* 8.8*   No results for input(s): LABPT, INR in the last 72 hours.  EXAM General - Patient is Alert, Appropriate and Oriented Extremity - Neurovascular intact Sensation intact distally Intact pulses distally Dorsiflexion/Plantar flexion intact No cellulitis present Compartment soft Dressing - dressing C/D/I and no drainage, wound vac intact, hemovac removed Motor Function - intact, moving foot and toes well on exam.   Past Medical History:  Diagnosis Date  . Acid reflux   . Arthritis   . Asthma   . COPD (chronic obstructive pulmonary disease) ([place])   . Depression   . Diabetes mellitus without complication ([place])   . Dyspnea   . Glaucoma   . Headache    right side of head  . Hyperlipidemia   . Hypertension   . Neuropathic pain of hands and feet   . PONV (postoperative nausea  and vomiting) 1982   back with my hysterectomy.  . Sleep apnea     Assessment/Plan:   2 Days Post-Op Procedure(s) (LRB): TOTAL HIP ARTHROPLASTY ANTERIOR APPROACH (Right) Active Problems:   Primary osteoarthritis of right hip   Acute post op blood loss anemia   Estimated body mass index is 36.01 kg/m as calculated from the following:   Height as of this encounter: 5\' 7"  (1.702 m).   Weight as of this encounter: 104.3 kg (229 lb 15 oz). Advance diet Up with therapy  Acute post op blood loss anemia - Hgb 10.3. Stable Discharge home today with HHPT pending BM Follow up with [place] [facility] in 2 weeks     DVT Prophylaxis - Lovenox, Foot Pumps and TED hose Weight-Bearing as tolerated to right leg   T. Rachelle Hora, PA-C [place] 06/15/2016, 8:23 AM

## 2016-06-15 NOTE — Progress Notes (Signed)
Physical Therapy Treatment Patient Details Name: Wanda Gardner MRN: [id number] DOB: 1940/05/05 Today's Date: 06/15/2016    History of Present Illness Pt is a 76 y/o F s/p R THA.  Pt's PMH includes R TKA, COPD, depression, neuropathic pain of hands and feet, glaucoma.    PT Comments    Ms. Ludke continues to make good progress with mobility.  She completed stair training with min guard assist for safety and cues for technique.  She ambulated 220 ft with supervision and two rest breaks.  Pt will benefit from continued skilled PT services in the home health setting to increase functional independence and safety.    Follow Up Recommendations  Home health PT     Equipment Recommendations  None recommended by PT    Recommendations for Other Services OT consult     Precautions / Restrictions Precautions Precautions: Fall Restrictions Weight Bearing Restrictions: Yes RLE Weight Bearing: Weight bearing as tolerated    Mobility  Bed Mobility               General bed mobility comments: Pt sitting EOB upon PT arrival  Transfers Overall transfer level: Needs assistance Equipment used: Rolling walker (2 wheeled) Transfers: Sit to/from Stand Sit to Stand: Supervision         General transfer comment: On one attempt during session pt stands with both hands on RW and requires cues for safe hand placement.  Ambulation/Gait Ambulation/Gait assistance: Supervision Ambulation Distance (Feet): 220 Feet Assistive device: Rolling walker (2 wheeled) Gait Pattern/deviations: Step-through pattern;Decreased stride length;Decreased stance time - right;Decreased weight shift to right;Decreased step length - left;Antalgic;Trunk flexed Gait velocity: decreased Gait velocity interpretation: Below normal speed for age/gender General Gait Details: Cues for upright posture and forward gaze and to relax shoulders.  Pt takes one standing rest break and one seated rest break due to  fatigue.   Stairs Stairs: Yes   Stair Management: One rail Left;Step to pattern;Sideways Number of Stairs: 4 General stair comments: Pt requires multiple verbal cues to only hold onto L railing to simulate home environment. Cues for sequencing and min guard assist for safety.  Emphasized to pt that she must have someone with her with ascending/descending steps when at home, pt verbalized understanding.  Wheelchair Mobility    Modified Rankin (Stroke Patients Only)       Balance Overall balance assessment: Needs assistance Sitting-balance support: No upper extremity supported;Feet supported Sitting balance-Leahy Scale: Good     Standing balance support: No upper extremity supported;During functional activity Standing balance-Leahy Scale: Fair Standing balance comment: Pt able to stand statically without UE support but requires RW for dynamic activities                            Cognition Arousal/Alertness: Awake/alert Behavior During Therapy: WFL for tasks assessed/performed Overall Cognitive Status: Within Functional Limits for tasks assessed                                        Exercises Total Joint Exercises Knee Flexion: AROM;Right;10 reps;Standing;Other (comment) (for gentle stretch) General Exercises - Lower Extremity Long Arc Quad: Strengthening;Both;10 reps;Seated Straight Leg Raises: AAROM;Right;10 reps;Seated    General Comments        Pertinent Vitals/Pain Pain Assessment: 0-10 Pain Score: 9  Pain Location: R hip Pain Descriptors / Indicators: Grimacing;Guarding Pain Intervention(s): Limited activity within  patient's tolerance;Monitored during session;Repositioned    Home Living                      Prior Function            PT Goals (current goals can now be found in the care plan section) Acute Rehab PT Goals Patient Stated Goal: to go home PT Goal Formulation: With patient Time For Goal Achievement:  06/28/16 Potential to Achieve Goals: Good Progress towards PT goals: Progressing toward goals    Frequency    BID      PT Plan Current plan remains appropriate    Co-evaluation             End of Session Equipment Utilized During Treatment: Gait belt Activity Tolerance: Patient tolerated treatment well Patient left: in chair;with call bell/phone within reach;with chair alarm set Nurse Communication: Mobility status PT Visit Diagnosis: Pain;Muscle weakness (generalized) (M62.81);Unsteadiness on feet (R26.81) Pain - Right/Left: Right Pain - part of body: Hip     Time: 1443-1540 PT Time Calculation (min) (ACUTE ONLY): 27 min  Charges:  $Gait Training: 8-22 mins $Therapeutic Exercise: 8-22 mins                    G Codes:       Collie Siad PT, DPT 06/15/2016, 1:58 PM

## 2016-06-15 NOTE — Discharge Summary (Signed)
Physician Discharge Summary  Patient ID: Wanda Gardner MRN: [id number] DOB/AGE: June 14, 1940 76 y.o.  Admit date: 06/13/2016 Discharge date: 06/15/2016  Admission Diagnoses:  PRIMARY LOCALIZED OSTEOARTHRITIS OF RIGHT HIP   Discharge Diagnoses: Patient Active Problem List   Diagnosis Date Noted  . Primary osteoarthritis of right hip 06/13/2016  . Primary localized osteoarthritis of right knee 11/23/2015  . Stress incontinence, female 08/19/2014  . Cephalalgia 07/25/2011  . Central retinal vein occlusion 03/31/2011  . Dry eye syndrome 03/31/2011  . Glaucoma associated with vascular disorder 05/23/2010  . 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 Artificial lens present 03/22/2010  . Chronic glaucoma 03/22/2010  . Cataract, nuclear sclerotic senile 03/22/2010  . Diabetes mellitus type 2, uncontrolled ([place]) 07/26/2007  . Encounter for general adult medical examination without abnormal findings 12/23/2003  . Polypharmacy 02/03/2003    Past Medical History:  Diagnosis Date  . Acid reflux   . Arthritis   . Asthma   . COPD (chronic obstructive pulmonary disease) ([place])   . Depression   . Diabetes mellitus without complication ([place])   . Dyspnea   . Glaucoma   . Headache    right side of head  . Hyperlipidemia   . Hypertension   . Neuropathic pain of hands and feet   . PONV (postoperative nausea and vomiting) 1982   back with my hysterectomy.  . Sleep apnea      Transfusion: none   Consultants (if any):   Discharged Condition: Improved  Hospital Course: Wanda Gardner is an 76 y.o. female who was admitted 06/13/2016 with a diagnosis of right hip osteoarthritis and went to the operating room on 06/13/2016 and underwent the above named procedures.    Surgeries: Procedure(s): TOTAL HIP ARTHROPLASTY ANTERIOR APPROACH on 06/13/2016 Patient tolerated the surgery well. Taken to PACU where she was stabilized and then transferred to the orthopedic  floor.  Started on Lovenox 40 q 24 hrs. Foot pumps applied bilaterally at 80 mm. Heels elevated on bed with rolled towels. No evidence of DVT. Negative [place]. Physical therapy started on day #1 for gait training and transfer. OT started day #1 for ADL and assisted devices.  Patient's foley was d/c on day #1.Post op day 1, patient with Acute post op blood loss anemia.  Patient's IV and hemovac was d/c on day #2. Acute post op blood loss anemia stable on post op day 2. On post op day #2 patient was stable and ready for discharge to home with HHPT.  Implants: Medacta Amis 3 standard stem with 52 mm Mpact DM cup and liner with a S 28 mm head  She was given perioperative antibiotics:  Anti-infectives    Start     Dose/Rate Route Frequency Ordered Stop   06/13/16 1600  ceFAZolin (ANCEF) 2 g in dextrose 5 % 100 mL IVPB     2 g 240 mL/hr over 30 Minutes Intravenous Every 6 hours 06/13/16 1402 06/14/16 0358   06/13/16 0811  ceFAZolin (ANCEF) 2-4 GM/100ML-% IVPB    Comments:  Wanda Gardner: cabinet override      06/13/16 0811 06/13/16 1016   06/13/16 0045  ceFAZolin (ANCEF) IVPB 2g/100 mL premix     2 g 200 mL/hr over 30 Minutes Intravenous  Once 06/13/16 0039 06/13/16 1046    .  She was given sequential compression devices, early ambulation, and Lovenox for DVT prophylaxis.  She benefited maximally from the hospital stay and there were no complications.  Recent vital signs:  Vitals:   06/14/16 2303 06/15/16 0731  BP: (!) 102/51 (!) 107/39  Pulse: 63 63  Resp: 18   Temp: 98.3 F (36.8 C) 99.3 F (37.4 C)    Recent laboratory studies:  Lab Results  Component Value Date   HGB 10.3 (L) 06/15/2016   HGB 10.4 (L) 06/14/2016   HGB 11.6 (L) 06/13/2016   Lab Results  Component Value Date   WBC 7.4 06/15/2016   PLT 244 06/15/2016   Lab Results  Component Value Date   INR 1.00 06/06/2016   Lab Results  Component Value Date   NA 138 06/15/2016   K 3.9 06/15/2016   CL 102  06/15/2016   CO2 30 06/15/2016   BUN 27 (H) 06/15/2016   CREATININE 1.12 (H) 06/15/2016   GLUCOSE 153 (H) 06/15/2016    Discharge Medications:   Allergies as of 06/15/2016   No Known Allergies     Medication List    STOP taking these medications   naproxen 500 MG tablet Commonly known as:  NAPROSYN   oxyCODONE-acetaminophen 5-325 MG tablet Commonly known as:  PERCOCET/ROXICET   traMADol 50 MG tablet Commonly known as:  ULTRAM     TAKE these medications   aspirin EC 81 MG tablet Take 81 mg by mouth daily.   BIOTIN PO Take 1 tablet by mouth daily. 1052mcg   brinzolamide 1 % ophthalmic suspension Commonly known as:  AZOPT Place 1 drop into both eyes 2 (two) times daily.   clobetasol 0.05 % external solution Commonly known as:  TEMOVATE Apply 1 application topically 2 (two) times daily.   diazepam 2 MG tablet Commonly known as:  VALIUM Take 1 tablet (2 mg total) by mouth every 8 (eight) hours as needed for muscle spasms.   diazepam 2 MG tablet Commonly known as:  VALIUM Take 1 tablet (2 mg total) by mouth every 8 (eight) hours as needed for muscle spasms.   diclofenac sodium 1 % Gel Commonly known as:  VOLTAREN Apply 2 g topically 2 (two) times daily as needed (pain).   docusate sodium 100 MG capsule Commonly known as:  COLACE Take 100 mg by mouth 2 (two) times daily. What changed:  Another medication with the same name was added. Make sure you understand how and when to take each.   docusate sodium 100 MG capsule Commonly known as:  COLACE Take 1 capsule (100 mg total) by mouth 2 (two) times daily. What changed:  You were already taking a medication with the same name, and this prescription was added. Make sure you understand how and when to take each.   DULoxetine 60 MG capsule Commonly known as:  CYMBALTA Take 60 mg by mouth at bedtime.   enoxaparin 40 MG/0.4ML injection Commonly known as:  LOVENOX Inject 0.4 mLs (40 mg total) into the skin daily.    esomeprazole 40 MG capsule Commonly known as:  NEXIUM Take 40 mg by mouth daily.   ferrous sulfate 325 (65 FE) MG tablet Take 325 mg by mouth daily with breakfast.   fluticasone 50 MCG/ACT nasal spray Commonly known as:  FLONASE Place 2 sprays into both nostrils 2 (two) times daily.   furosemide 20 MG tablet Commonly known as:  LASIX Take 40 mg by mouth daily.   LANTUS SOLOSTAR 100 UNIT/ML Solostar Pen Generic drug:  Insulin Glargine Inject 45 Units into the skin daily at 10 pm.   losartan-hydrochlorothiazide 100-12.5 MG tablet Commonly known as:  HYZAAR Take  1 tablet by mouth daily.   LYRICA 225 MG capsule Generic drug:  pregabalin Take 225 mg by mouth 2 (two) times daily.   metFORMIN 500 MG tablet Commonly known as:  GLUCOPHAGE Take 500 mg by mouth 2 (two) times daily.   metoprolol succinate 50 MG 24 hr tablet Commonly known as:  TOPROL-XL Take 50 mg by mouth daily.   NOVOLOG FLEXPEN 100 UNIT/ML FlexPen Generic drug:  insulin aspart Inject 0-15 Units into the skin 3 (three) times daily before meals. Sliding scale based on blood glucose levels.   oxyCODONE 5 MG immediate release tablet Commonly known as:  Oxy IR/ROXICODONE Take 1-2 tablets (5-10 mg total) by mouth every 3 (three) hours as needed for breakthrough pain. What changed:  Another medication with the same name was added. Make sure you understand how and when to take each.   oxyCODONE 5 MG immediate release tablet Commonly known as:  Oxy IR/ROXICODONE Take 1-2 tablets (5-10 mg total) by mouth every 4 (four) hours as needed for breakthrough pain. What changed:  You were already taking a medication with the same name, and this prescription was added. Make sure you understand how and when to take each.   potassium chloride SA 20 MEQ tablet Commonly known as:  K-DUR,KLOR-CON Take 20 mEq by mouth daily.   pravastatin 40 MG tablet Commonly known as:  PRAVACHOL Take 40 mg by mouth at bedtime.    promethazine-dextromethorphan 6.25-15 MG/5ML syrup Commonly known as:  PROMETHAZINE-DM Take 5 mLs by mouth 4 (four) times daily as needed for cough.   ULTICARE SHORT PEN NEEDLES 31G X 8 MM Misc Generic drug:  Insulin Pen Needle   VENTOLIN HFA 108 (90 Base) MCG/ACT inhaler Generic drug:  albuterol Inhale 2 puffs into the lungs every 6 (six) hours as needed for wheezing or shortness of breath.   vitamin B-12 1000 MCG tablet Commonly known as:  CYANOCOBALAMIN Take 1,000 mcg by mouth daily.   Vitamin D3 5000 units Caps Take 5,000 Units by mouth daily.   vitamin E 400 UNIT capsule Take 400 Units by mouth daily.            Durable Medical Equipment        Start     Ordered   06/13/16 1403  DME Walker rolling  Once    Question:  Patient needs a walker to treat with the following condition  Answer:  Status post total hip replacement, right   06/13/16 1402   06/13/16 1403  DME 3 n 1  Once     06/13/16 1402   06/13/16 1403  DME Bedside commode  Once    Question:  Patient needs a bedside commode to treat with the following condition  Answer:  Status post total hip replacement, right   06/13/16 1402      Diagnostic Studies: Dg Hip Operative Unilat W Or W/o Pelvis Right  Result Date: 06/13/2016 CLINICAL DATA:  Status post right total hip replacement EXAM: OPERATIVE RIGHT HIP   2 VIEWS TECHNIQUE: Fluoroscopic spot image(s) were submitted for interpretation post-operatively. FLUOROSCOPY TIME:  0 minutes 12 seconds; 2 submitted images COMPARISON:  None. FINDINGS: Initial frontal view shows arthropathic change in the right hip joint. Second submitted image shows a total hip replacement right with visualized prosthetic components well-seated. No fracture or dislocation evident. IMPRESSION: Total hip prosthesis on the right with visualized prosthetic components well-seated on frontal view. No fracture or dislocation evident. Electronically Signed   By: Lowella Grip III  M.D.   On:  06/13/2016 12:03   Dg Hip Unilat W Or W/o Pelvis 2-3 Views Right  Result Date: 06/13/2016 CLINICAL DATA:  Right hip replacement surgery EXAM: DG HIP (WITH OR WITHOUT PELVIS) 2-3V RIGHT COMPARISON:  Intra op images from earlier the same day FINDINGS: Components of right hip arthroplasty project in expected location. No fracture or dislocation. Lateral skin staples. Surgical clips noted in the pelvis. IMPRESSION: 1. Right hip arthroplasty without apparent complication. Electronically Signed   By: Lucrezia Europe M.D.   On: 06/13/2016 13:26    Disposition: 03-Skilled Nursing Facility       Signed: [place] 06/15/2016, 8:30 AM

## 2016-06-15 NOTE — Discharge Instructions (Signed)

## 2016-06-15 NOTE — Care Management Important Message (Signed)
Important Message  Patient Details  Name: SHANAY WOOLMAN MRN: [id number] Date of Birth: March 19, 1940   Medicare Important Message Given:  N/A - LOS <3 / Initial given by admissions    Jolly Mango, RN 06/15/2016, 9:04 AM

## 2016-06-15 NOTE — Care Management (Signed)
[employer] will not pay for a [facility] for patient. She is not willing to private pay for [facility]. Corene Cornea with [facility] updated.

## 2016-06-15 NOTE — Progress Notes (Signed)
Pt discharged to home via wheelchair without incident per MD order. No change in pt from AM assessment. Pt pain controlled on discharge to home. All discharge teachings done both written and verbal. Pt able to return demonstrate proper Lovenox administration this AM. All questions answered. Pt d/c'd to home with Lovenox started kit and all prescriptions.

## 2016-06-16 ENCOUNTER — Emergency Department
Admission: EM | Admit: 2016-06-16 | Discharge: 2016-06-16 | Disposition: A | Discharge status: Home / Self Care | Payer: Medicare HMO | Attending: Emergency Medicine | Admitting: Emergency Medicine

## 2016-06-16 ENCOUNTER — Emergency Department: Payer: Medicare HMO

## 2016-06-16 DIAGNOSIS — Z7984 Long term (current) use of oral hypoglycemic drugs: Secondary | ICD-10-CM | POA: Insufficient documentation

## 2016-06-16 DIAGNOSIS — E119 Type 2 diabetes mellitus without complications: Secondary | ICD-10-CM | POA: Insufficient documentation

## 2016-06-16 DIAGNOSIS — K625 Hemorrhage of anus and rectum: Secondary | ICD-10-CM

## 2016-06-16 DIAGNOSIS — J45909 Unspecified asthma, uncomplicated: Secondary | ICD-10-CM | POA: Insufficient documentation

## 2016-06-16 DIAGNOSIS — R1084 Generalized abdominal pain: Secondary | ICD-10-CM

## 2016-06-16 DIAGNOSIS — J449 Chronic obstructive pulmonary disease, unspecified: Secondary | ICD-10-CM | POA: Insufficient documentation

## 2016-06-16 DIAGNOSIS — I10 Essential (primary) hypertension: Secondary | ICD-10-CM | POA: Diagnosis not present

## 2016-06-16 DIAGNOSIS — F17210 Nicotine dependence, cigarettes, uncomplicated: Secondary | ICD-10-CM | POA: Diagnosis not present

## 2016-06-16 DIAGNOSIS — Z7982 Long term (current) use of aspirin: Secondary | ICD-10-CM | POA: Diagnosis not present

## 2016-06-16 DIAGNOSIS — Z79899 Other long term (current) drug therapy: Secondary | ICD-10-CM | POA: Diagnosis not present

## 2016-06-16 DIAGNOSIS — K5900 Constipation, unspecified: Secondary | ICD-10-CM | POA: Diagnosis not present

## 2016-06-16 LAB — CBC WITH DIFFERENTIAL/PLATELET
Basophils Absolute: 0 10*3/uL (ref 0–0.1)
Basophils Relative: 1 %
Eosinophils Absolute: 0.1 10*3/uL (ref 0–0.7)
Eosinophils Relative: 2 %
HCT: 30.2 % — ABNORMAL LOW (ref 35.0–47.0)
HEMOGLOBIN: 10.3 g/dL — AB (ref 12.0–16.0)
LYMPHS ABS: 1.7 10*3/uL (ref 1.0–3.6)
LYMPHS PCT: 27 %
MCH: 29.1 pg (ref 26.0–34.0)
MCHC: 34.2 g/dL (ref 32.0–36.0)
MCV: 85.1 fL (ref 80.0–100.0)
MONO ABS: 0.4 10*3/uL (ref 0.2–0.9)
MONOS PCT: 6 %
NEUTROS ABS: 4.2 10*3/uL (ref 1.4–6.5)
NEUTROS PCT: 64 %
Platelets: 238 10*3/uL (ref 150–440)
RBC: 3.55 MIL/uL — ABNORMAL LOW (ref 3.80–5.20)
RDW: 14.9 % — AB (ref 11.5–14.5)
WBC: 6.5 10*3/uL (ref 3.6–11.0)

## 2016-06-16 LAB — CBC
HEMATOCRIT: 31 % — AB (ref 35.0–47.0)
HEMOGLOBIN: 10.7 g/dL — AB (ref 12.0–16.0)
MCH: 29.3 pg (ref 26.0–34.0)
MCHC: 34.5 g/dL (ref 32.0–36.0)
MCV: 84.9 fL (ref 80.0–100.0)
Platelets: 243 10*3/uL (ref 150–440)
RBC: 3.65 MIL/uL — AB (ref 3.80–5.20)
RDW: 15.1 % — ABNORMAL HIGH (ref 11.5–14.5)
WBC: 7.3 10*3/uL (ref 3.6–11.0)

## 2016-06-16 LAB — URINALYSIS, COMPLETE (UACMP) WITH MICROSCOPIC
BACTERIA UA: NONE SEEN
Bilirubin Urine: NEGATIVE
GLUCOSE, UA: 50 mg/dL — AB
Hgb urine dipstick: NEGATIVE
Ketones, ur: NEGATIVE mg/dL
LEUKOCYTES UA: NEGATIVE
Nitrite: NEGATIVE
PH: 6 (ref 5.0–8.0)
Protein, ur: NEGATIVE mg/dL
RBC / HPF: NONE SEEN RBC/hpf (ref 0–5)
SPECIFIC GRAVITY, URINE: 1.011 (ref 1.005–1.030)
SQUAMOUS EPITHELIAL / LPF: NONE SEEN

## 2016-06-16 LAB — COMPREHENSIVE METABOLIC PANEL
ALT: 24 U/L (ref 14–54)
ANION GAP: 6 (ref 5–15)
AST: 23 U/L (ref 15–41)
Albumin: 3.4 g/dL — ABNORMAL LOW (ref 3.5–5.0)
Alkaline Phosphatase: 102 U/L (ref 38–126)
BUN: 21 mg/dL — ABNORMAL HIGH (ref 6–20)
CO2: 30 mmol/L (ref 22–32)
Calcium: 9.2 mg/dL (ref 8.9–10.3)
Chloride: 101 mmol/L (ref 101–111)
Creatinine, Ser: 0.97 mg/dL (ref 0.44–1.00)
GFR calc non Af Amer: 56 mL/min — ABNORMAL LOW (ref >60)
Glucose, Bld: 217 mg/dL — ABNORMAL HIGH (ref 65–99)
Potassium: 4 mmol/L (ref 3.5–5.1)
SODIUM: 137 mmol/L (ref 135–145)
Total Bilirubin: 0.9 mg/dL (ref 0.3–1.2)
Total Protein: 6.5 g/dL (ref 6.5–8.1)

## 2016-06-16 LAB — TYPE AND SCREEN
ABO/RH(D): O POS
Antibody Screen: NEGATIVE

## 2016-06-16 MED ORDER — OXYCODONE HCL 5 MG PO TABS: 5.0000 mg | ORAL_TABLET | Freq: Four times a day (QID) | ORAL | 0 refills | Status: DC | PRN

## 2016-06-16 MED ORDER — SENNA 8.6 MG PO TABS: 2.0000 | ORAL_TABLET | Freq: Two times a day (BID) | ORAL | 0 refills | Status: DC

## 2016-06-16 MED ORDER — OXYCODONE-ACETAMINOPHEN 5-325 MG PO TABS
1.0000 | ORAL_TABLET | Freq: Once | ORAL | Status: AC
  Administered 2016-06-16: 1 via ORAL
  Filled 2016-06-16: qty 1

## 2016-06-16 MED ORDER — IOPAMIDOL (ISOVUE-300) INJECTION 61%
30.0000 mL | Freq: Once | INTRAVENOUS | Status: AC | PRN
  Administered 2016-06-16: 30 mL via ORAL

## 2016-06-16 MED ORDER — DOCUSATE SODIUM 100 MG PO CAPS: 100.0000 mg | ORAL_CAPSULE | Freq: Two times a day (BID) | ORAL | 0 refills | Status: DC

## 2016-06-16 MED ORDER — SENNOSIDES-DOCUSATE SODIUM 8.6-50 MG PO TABS
2.0000 | ORAL_TABLET | Freq: Once | ORAL | Status: AC
  Administered 2016-06-16: 2 via ORAL
  Filled 2016-06-16: qty 2

## 2016-06-16 MED ORDER — IOPAMIDOL (ISOVUE-300) INJECTION 61%
100.0000 mL | Freq: Once | INTRAVENOUS | Status: AC | PRN
  Administered 2016-06-16: 100 mL via INTRAVENOUS

## 2016-06-16 MED ORDER — POLYETHYLENE GLYCOL 3350 17 GM/SCOOP PO POWD: ORAL | 0 refills | Status: DC

## 2016-06-16 NOTE — ED Provider Notes (Signed)
[facility] Emergency Department Provider Note  ____________________________________________  Time seen: Approximately 7:50 PM  I have reviewed the triage vital signs and the nursing notes.   HISTORY  Chief Complaint Rectal Bleeding    HPI Wanda Gardner is a 76 y.o. female who complains of rectal bleedingstarting at 3 AM today. States that she's had multiple episodes of using the toilet and noting a small amount of blood on the toilet tissue. Denies melena. Denies large bloody bowel movement. No nausea or vomiting. No fever or chills. She was discharged with multiple to prescriptions from the hospital after a total hip replacement on the right 2 days ago, but she reports that she lost the paperwork and was unable to fill the prescriptions including for laxative and Percocet.  Also reports generalized abdominal pain and feeling like her abdomen is bloated. No aggravating or alleviating factors. It is colicky. Moderate intensity. Nonradiating.     Past Medical History:  Diagnosis Date  . Acid reflux   . Arthritis   . Asthma   . COPD (chronic obstructive pulmonary disease) ([place])   . Depression   . Diabetes mellitus without complication ([place])   . Dyspnea   . Glaucoma   . Headache    right side of head  . Hyperlipidemia   . Hypertension   . Neuropathic pain of hands and feet   . PONV (postoperative nausea and vomiting) 1982   back with my hysterectomy.  . Sleep apnea      Patient Active Problem List   Diagnosis Date Noted  . Primary osteoarthritis of right hip 06/13/2016  . Primary localized osteoarthritis of right knee 11/23/2015  . Stress incontinence, female 08/19/2014  . Cephalalgia 07/25/2011  . Central retinal vein occlusion 03/31/2011  . Dry eye syndrome 03/31/2011  . Glaucoma associated with vascular disorder 05/23/2010  . 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 Artificial lens present 03/22/2010  .  Chronic glaucoma 03/22/2010  . Cataract, nuclear sclerotic senile 03/22/2010  . Diabetes mellitus type 2, uncontrolled ([place]) 07/26/2007  . Encounter for general adult medical examination without abnormal findings 12/23/2003  . Polypharmacy 02/03/2003     Past Surgical History:  Procedure Laterality Date  . ABDOMINAL HYSTERECTOMY    . CATARACT EXTRACTION W/ INTRAOCULAR LENS  IMPLANT, BILATERAL    . CHOLECYSTECTOMY    . COLONOSCOPY WITH PROPOFOL N/A 10/29/2014   Procedure: COLONOSCOPY WITH PROPOFOL;  Surgeon: Hulen Luster, MD;  Location: [facility] ENDOSCOPY;  Service: Gastroenterology;  Laterality: N/A;  . ESOPHAGOGASTRODUODENOSCOPY (EGD) WITH PROPOFOL N/A 10/29/2014   Procedure: ESOPHAGOGASTRODUODENOSCOPY (EGD) WITH PROPOFOL;  Surgeon: Hulen Luster, MD;  Location: [facility] ENDOSCOPY;  Service: Gastroenterology;  Laterality: N/A;  . EYE SURGERY Bilateral 2011   lid lift done 3 times  . FOOT SURGERY    . HAND SURGERY    . TONGUE SURGERY     removal of cancer  . TONSILLECTOMY    . 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/13/2016   Procedure: TOTAL HIP ARTHROPLASTY ANTERIOR APPROACH;  Surgeon: Hessie Knows, MD;  Location: [facility] ORS;  Service: Orthopedics;  Laterality: Right;  . TOTAL KNEE ARTHROPLASTY Right 11/23/2015   Procedure: TOTAL KNEE ARTHROPLASTY;  Surgeon: Hessie Knows, MD;  Location: [facility] ORS;  Service: Orthopedics;  Laterality: Right;  Marland Kitchen VOCAL CORD LATERALIZATION, ENDOSCOPIC APPROACH W/ MLB       Prior to Admission medications   Medication Sig Start Date End Date Taking? Authorizing Provider  aspirin EC 81 MG tablet  Take 81 mg by mouth daily.   Yes Historical Provider, MD  BIOTIN PO Take 1 tablet by mouth daily. 1040mcg   Yes Historical Provider, MD  brinzolamide (AZOPT) 1 % ophthalmic suspension Place 1 drop into both eyes 2 (two) times daily.   Yes Historical Provider, MD  Cholecalciferol (VITAMIN D3) 5000 units CAPS Take 5,000 Units by mouth daily.   Yes Historical Provider, MD  clobetasol (TEMOVATE)  0.05 % external solution Apply 1 application topically 2 (two) times daily. 04/13/16  Yes Historical Provider, MD  diclofenac sodium (VOLTAREN) 1 % GEL Apply 2 g topically 2 (two) times daily as needed (pain).    Yes Historical Provider, MD  DULoxetine (CYMBALTA) 60 MG capsule Take 60 mg by mouth at bedtime. 03/23/16  Yes Historical Provider, MD  esomeprazole (NEXIUM) 40 MG capsule Take 40 mg by mouth daily. 05/17/16  Yes Historical Provider, MD  fluticasone (FLONASE) 50 MCG/ACT nasal spray Place 2 sprays into both nostrils 2 (two) times daily.   Yes Historical Provider, MD  furosemide (LASIX) 20 MG tablet Take 40 mg by mouth daily. 05/17/16  Yes Historical Provider, MD  LANTUS SOLOSTAR 100 UNIT/ML Solostar Pen Inject 45 Units into the skin daily at 10 pm.  08/17/14  Yes Historical Provider, MD  losartan-hydrochlorothiazide (HYZAAR) 100-12.5 MG tablet Take 1 tablet by mouth daily. 05/17/16  Yes Historical Provider, MD  LYRICA 225 MG capsule Take 225 mg by mouth 2 (two) times daily. 05/17/16  Yes Historical Provider, MD  metFORMIN (GLUCOPHAGE) 500 MG tablet Take 500 mg by mouth 2 (two) times daily.   Yes Historical Provider, MD  NOVOLOG FLEXPEN 100 UNIT/ML FlexPen Inject 0-15 Units into the skin 3 (three) times daily before meals. Sliding scale based on blood glucose levels. 08/14/14  Yes Historical Provider, MD  potassium chloride SA (K-DUR,KLOR-CON) 20 MEQ tablet Take 20 mEq by mouth daily.   Yes Historical Provider, MD  pravastatin (PRAVACHOL) 40 MG tablet Take 40 mg by mouth at bedtime. 03/24/16  Yes Historical Provider, MD  VENTOLIN HFA 108 (90 BASE) MCG/ACT inhaler Inhale 2 puffs into the lungs every 6 (six) hours as needed for wheezing or shortness of breath.  07/13/14  Yes Historical Provider, MD  vitamin B-12 (CYANOCOBALAMIN) 1000 MCG tablet Take 1,000 mcg by mouth daily.   Yes Historical Provider, MD  vitamin E 400 UNIT capsule Take 400 Units by mouth daily.   Yes Historical Provider, MD  diazepam  (VALIUM) 2 MG tablet Take 1 tablet (2 mg total) by mouth every 8 (eight) hours as needed for muscle spasms. Patient not taking: Reported on 06/02/2016 10/26/15   Dannielle Karvonen Menshew, PA-C  diazepam (VALIUM) 2 MG tablet Take 1 tablet (2 mg total) by mouth every 8 (eight) hours as needed for muscle spasms. Patient not taking: Reported on 06/16/2016 11/26/15   Duanne Guess, PA-C  docusate sodium (COLACE) 100 MG capsule Take 1 capsule (100 mg total) by mouth 2 (two) times daily. 06/16/16   Carrie Mew, MD  enoxaparin (LOVENOX) 40 MG/0.4ML injection Inject 0.4 mLs (40 mg total) into the skin daily. Patient not taking: Reported on 06/16/2016 06/15/16 06/29/16  Duanne Guess, PA-C  ferrous sulfate 325 (65 FE) MG tablet Take 325 mg by mouth daily with breakfast.    Historical Provider, MD  oxyCODONE (OXY IR/ROXICODONE) 5 MG immediate release tablet Take 1 tablet (5 mg total) by mouth every 6 (six) hours as needed for breakthrough pain. 06/16/16   Carrie Mew, MD  polyethylene glycol powder (GLYCOLAX/MIRALAX) powder 2 cap fulls in a full glass of water, two times a day, for 5 days. 06/16/16   Carrie Mew, MD  promethazine-dextromethorphan (PROMETHAZINE-DM) 6.25-15 MG/5ML syrup Take 5 mLs by mouth 4 (four) times daily as needed for cough. Patient not taking: Reported on 06/16/2016 07/16/14   Paulette Blanch, MD  senna (SENOKOT) 8.6 MG TABS tablet Take 2 tablets (17.2 mg total) by mouth 2 (two) times daily. 06/16/16   Carrie Mew, MD  ULTICARE SHORT PEN NEEDLES 31G X 8 MM [place]  08/14/14   Historical Provider, MD     Allergies Patient has no known allergies.   Family History  Problem Relation Age of Onset  . Cancer Father   . Heart disease Mother     Social History Social History  Substance Use Topics  . Smoking status: Current Every Day Smoker    Packs/day: 1.00    Years: 50.00    Types: Cigarettes  . Smokeless tobacco: Never Used  . Alcohol use 0.0 oz/week     Comment: 1 glass of  wine twice a year    Review of Systems  Constitutional:   No fever or chills.  ENT:   No sore throat. No rhinorrhea. Lymphatic: No swollen glands, No extremity swelling Endocrine: No hot/cold flashes. No significant weight change. No neck swelling. Cardiovascular:   No chest pain or syncope. Respiratory:   No dyspnea or cough. Gastrointestinal:   Positive for abdominal pain. No bowel movement in several days..  Genitourinary:   Negative for dysuria or difficulty urinating. Musculoskeletal:   Right hip pain Neurological:   Negative for headaches or weakness. All other systems reviewed and are negative except as documented above in ROS and HPI.  ____________________________________________   PHYSICAL EXAM:  VITAL SIGNS: ED Triage Vitals  Enc Vitals Group     BP 06/16/16 1457 (!) 131/53     Pulse Rate 06/16/16 1457 76     Resp 06/16/16 1457 20     Temp 06/16/16 1457 98.8 F (37.1 C)     Temp Source 06/16/16 1457 Oral     SpO2 06/16/16 1457 96 %     Weight 06/16/16 1457 229 lb (103.9 kg)     Height 06/16/16 1457 5\' 7"  (1.702 m)     Head Circumference --      Peak Flow --      Pain Score 06/16/16 1456 5     Pain Loc --      Pain Edu? --      Excl. in [place]? --     Vital signs reviewed, nursing assessments reviewed.   Constitutional:   Alert and oriented. Well appearing and in no distress. Eyes:   No scleral icterus. No conjunctival pallor. PERRL. EOMI.  No nystagmus. ENT   Head:   Normocephalic and atraumatic.   Nose:   No congestion/rhinnorhea. No septal hematoma   Mouth/Throat:   MMM, no pharyngeal erythema. No peritonsillar mass.    Neck:   No stridor. No SubQ emphysema. No meningismus. Hematological/Lymphatic/Immunilogical:   No cervical lymphadenopathy. Cardiovascular:   RRR. Symmetric bilateral radial and DP pulses.  No murmurs.  Respiratory:   Normal respiratory effort without tachypnea nor retractions. Breath sounds are clear and equal bilaterally. No  wheezes/rales/rhonchi. Gastrointestinal:   Soft With generalized tenderness. Mildly distended. There is no CVA tenderness.  No rebound, rigidity, or guarding. Genitourinary:   deferred Musculoskeletal:   No lower extremity edema or calf tenderness. Symmetric Circumference. Right  hip incision has a wound VAC in place with good seal and suction. Surrounding area is not inflamed and clean and dry. No purulent discharge or drainage in the wound VAC tubing. No warmth erythema or other inflammatory changes in the area. Not significantly tender.. Neurologic:   Normal speech and language.  CN 2-10 normal. Motor grossly intact. No gross focal neurologic deficits are appreciated.  Skin:    Skin is warm, dry and intact except surgical incision as above. No rash noted.  No petechiae, purpura, or bullae.  ____________________________________________    LABS (pertinent positives/negatives) (all labs ordered are listed, but only abnormal results are displayed) Labs Reviewed  COMPREHENSIVE METABOLIC PANEL - Abnormal; Notable for the following:       Result Value   Glucose, Bld 217 (*)    BUN 21 (*)    Albumin 3.4 (*)    GFR calc non Af Amer 56 (*)    All other components within normal limits  CBC - Abnormal; Notable for the following:    RBC 3.65 (*)    Hemoglobin 10.7 (*)    HCT 31.0 (*)    RDW 15.1 (*)    All other components within normal limits  URINALYSIS, COMPLETE (UACMP) WITH MICROSCOPIC - Abnormal; Notable for the following:    Color, Urine YELLOW (*)    APPearance CLEAR (*)    Glucose, UA 50 (*)    All other components within normal limits  CBC WITH DIFFERENTIAL/PLATELET - Abnormal; Notable for the following:    RBC 3.55 (*)    Hemoglobin 10.3 (*)    HCT 30.2 (*)    RDW 14.9 (*)    All other components within normal limits  URINE CULTURE  POC OCCULT BLOOD, ED  TYPE AND SCREEN   ____________________________________________   EKG  Interpreted by me Sinus rhythm rate of 76,  normal axis and intervals. Poor R-wave progression in anterior precordial leads. Normal ST segments and T waves.  ____________________________________________    RADIOLOGY  Ct Abdomen Pelvis W Contrast  Result Date: 06/16/2016 CLINICAL DATA:  Right hip replacement 2 days ago. Abdominal and pelvic pain. Rectal bleeding. EXAM: CT ABDOMEN AND PELVIS WITH CONTRAST TECHNIQUE: Multidetector CT imaging of the abdomen and pelvis was performed using the standard protocol following bolus administration of intravenous contrast. CONTRAST:  173mL ISOVUE-300 IOPAMIDOL (ISOVUE-300) INJECTION 61% COMPARISON:  None. FINDINGS: Lower chest: No acute abnormality. Hepatobiliary: The patient is status post cholecystectomy. Intra and extrahepatic biliary duct dilatation may be from previous cholecystectomy. Recommend clinical correlation. No focal masses seen in the liver. The portal vein is patent. Pancreas: Unremarkable. No pancreatic ductal dilatation or surrounding inflammatory changes. Spleen: Normal in size without focal abnormality. Adrenals/Urinary Tract: Nodularity in both adrenal glands is most consistent with a benign etiology such as adenomas. No dominant suspicious masses are seen. Bilateral renal cysts are seen. No suspicious renal masses, stones, hydronephrosis, or perinephric stranding. No ureterectasis or ureteral stones. The bladder is partially obscured by streak artifact off the right hip replacement but no abnormalities are seen in the bladder. Stomach/Bowel: The stomach and small bowel are normal. Fecal loading in the colon is identified. The colon is otherwise normal. The appendix is normal in appearance. Vascular/Lymphatic: Atherosclerotic changes seen in the abdominal aorta, iliac vessels, and femoral vessels. A mildly prominent node in the right external iliac chain on series 2, image 72 is likely reactive given recent hip replacement. No suspicious adenopathy identified on today's study. Reproductive:  Status post hysterectomy. No  adnexal masses. Other: No free air free fluid. A fat containing periumbilical hernia is identified. Musculoskeletal: Postoperative changes are seen in the soft tissues around the right upper thigh consistent with surgery 2 days ago. A small amount of remaining soft tissue gas, stranding, and fluid is identified. The right acetabular and femoral components of the hip replacement are in good position. Degenerative changes are seen in the lower lumbar spine. IMPRESSION: 1. Postoperative changes in the right hip. 2. No acute intra-abdominal or intrapelvic abnormality to explain the patient's symptoms. 3. Atherosclerosis in the abdominal aorta and branching vessels. 4. Intra and extrahepatic biliary duct dilatation may simply be from previous cholecystectomy. Recommend clinical correlation. Electronically Signed   By: Dorise Bullion III M.D   On: 06/16/2016 17:53    ____________________________________________   PROCEDURES Procedures  ____________________________________________   INITIAL IMPRESSION / ASSESSMENT AND PLAN / ED COURSE  Pertinent labs & imaging results that were available during my care of the patient were reviewed by me and considered in my medical decision making (see chart for details).       Clinical Course as of Jun 16 1948  Fri Jun 16, 2016  1559 Rectal bleeding 2 days post op. Abd pain, distension, tenderness. Will get CT a/p. Labs unremarkable.   [PS]  1857 Ct reviewed, unremarakble except large fecal load. Constipation may be causing bleeding. No rectal fecal impaction on exam. Will start laxatives. Repeat CBC to ensure stable Hb.   [PS]  1938 Hb stable. Will reprint lost prescriptions. Follow up Rudene Christians in 3 days on Monday.   [PS]    Clinical Course User Index [PS] Carrie Mew, MD    ----------------------------------------- 8:05 PM on 06/16/2016 -----------------------------------------  CT negative except for constipation. Hold  Lovenox until reassessed by primary care or surgery on Monday in 3 days. Start senna Colace and MiraLAX. Percocet as needed for pain. I'll give a dose now prior to discharge shows the patient has medications overnight until she can fill the prescriptions tomorrow. Return precautions given. No evidence of significant GI bleed. She is hemodynamically stable. Hemoglobin is stable. No evidence of any postoperative complication such as septic arthritis necrotizing fasciitis cellulitis or abscess formation. No evidence of osteomyelitis. Suitable for outpatient follow-up.   ____________________________________________   FINAL CLINICAL IMPRESSION(S) / ED DIAGNOSES  Final diagnoses:  Constipation, unspecified constipation type  Rectal bleeding  Generalized abdominal pain      New Prescriptions   POLYETHYLENE GLYCOL POWDER (GLYCOLAX/MIRALAX) POWDER    2 cap fulls in a full glass of water, two times a day, for 5 days.   SENNA (SENOKOT) 8.6 MG TABS TABLET    Take 2 tablets (17.2 mg total) by mouth 2 (two) times daily.     Portions of this note were generated with dragon dictation software. Dictation errors may occur despite best attempts at proofreading.    Carrie Mew, MD 06/16/16 2006

## 2016-06-16 NOTE — ED Notes (Signed)

## 2016-06-16 NOTE — ED Notes (Signed)
Pt assisted to bedpan.  ?

## 2016-06-16 NOTE — ED Notes (Signed)
Pt eating sandwich tray at this time  

## 2016-06-16 NOTE — ED Notes (Signed)
MD performed rectal exam with this RN at bedside. Pt tolerated well.

## 2016-06-16 NOTE — ED Notes (Signed)
Pt attempting to call for ride at this time 

## 2016-06-16 NOTE — ED Notes (Signed)
Pt assisted with bed pan

## 2016-06-16 NOTE — ED Triage Notes (Signed)
Pt has a right THR performed 2 days ago by Dr.Menz, pt discharged yesterday, pt sent home on Lovenox , hasn't taken dose today, pt had an episode of blood in toilet with abdominal pain, right hip dressing intact with drainage tube

## 2016-06-17 LAB — URINE CULTURE: CULTURE: NO GROWTH

## 2016-07-26 ENCOUNTER — Other Ambulatory Visit: Payer: Self-pay | Admitting: Orthopedic Surgery

## 2016-07-26 DIAGNOSIS — M9933 Osseous stenosis of neural canal of lumbar region: Secondary | ICD-10-CM

## 2016-08-21 ENCOUNTER — Ambulatory Visit
Admission: RE | Admit: 2016-08-21 | Discharge: 2016-08-21 | Disposition: A | Discharge status: Home / Self Care | Payer: Medicare HMO | Source: Ambulatory Visit | Attending: Orthopedic Surgery | Admitting: Orthopedic Surgery

## 2016-08-21 DIAGNOSIS — M5136 Other intervertebral disc degeneration, lumbar region: Secondary | ICD-10-CM | POA: Insufficient documentation

## 2016-08-21 DIAGNOSIS — M4807 Spinal stenosis, lumbosacral region: Secondary | ICD-10-CM | POA: Diagnosis not present

## 2016-08-21 DIAGNOSIS — M5124 Other intervertebral disc displacement, thoracic region: Secondary | ICD-10-CM | POA: Diagnosis not present

## 2016-08-21 DIAGNOSIS — M48061 Spinal stenosis, lumbar region without neurogenic claudication: Secondary | ICD-10-CM | POA: Insufficient documentation

## 2016-08-21 DIAGNOSIS — M9933 Osseous stenosis of neural canal of lumbar region: Secondary | ICD-10-CM | POA: Diagnosis present

## 2016-08-21 DIAGNOSIS — M5126 Other intervertebral disc displacement, lumbar region: Secondary | ICD-10-CM | POA: Diagnosis not present

## 2016-08-21 DIAGNOSIS — M2578 Osteophyte, vertebrae: Secondary | ICD-10-CM | POA: Diagnosis not present

## 2017-01-15 ENCOUNTER — Ambulatory Visit: Payer: Medicare HMO | Admitting: Student in an Organized Health Care Education/Training Program

## 2018-02-19 IMAGING — MR MR LUMBAR SPINE W/O CM
4 of 5 series · 14 of 48 positions shown · non-contrast
Comparison: Lumbar MRI 01/06/2010.  Abdominopelvic CT 06/16/2016

CLINICAL DATA: 75-year-old with low back pain, bilateral leg pain
and numbness extending into the calf for 3 months. Pain is worse in
the right leg. No acute injury or prior relevant surgery.

EXAM:
MRI LUMBAR SPINE WITHOUT CONTRAST
TECHNIQUE: Multiplanar, multisequence MR imaging of the lumbar spine was
performed. No intravenous contrast was administered.

[Series 4: T2 · sagittal · 4.0mm · 0.44mm/px · 5 of 17 slices shown (1 of 2)]
[im 1/17]
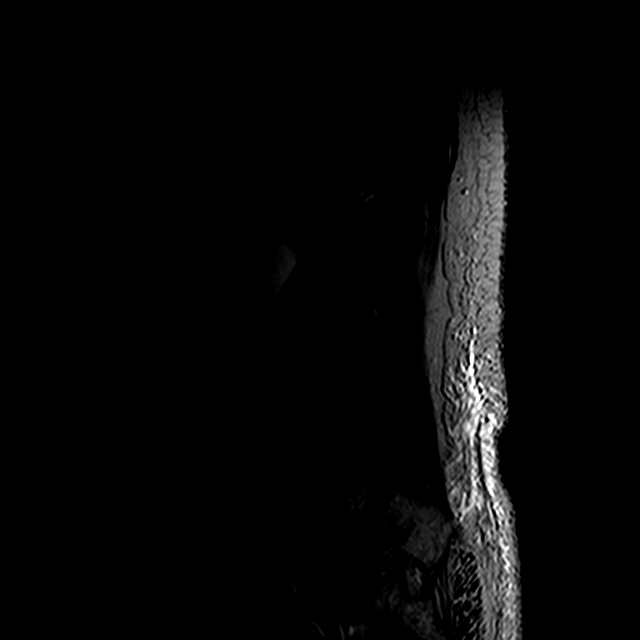
[im 4/17]
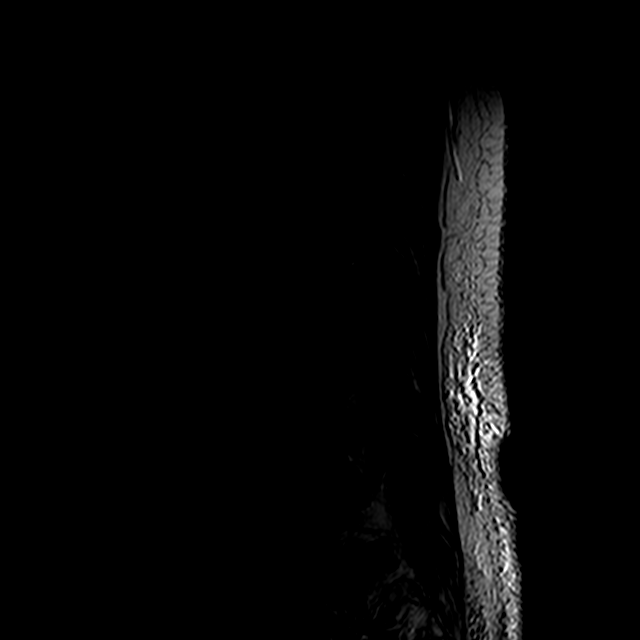
[im 7/17]
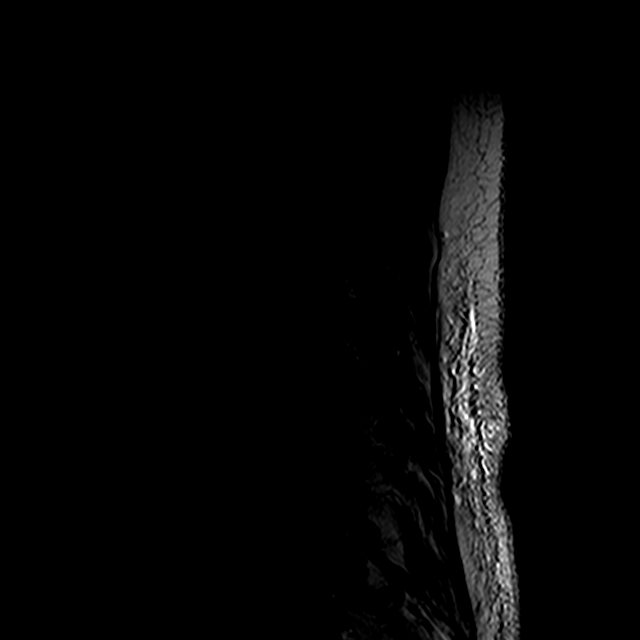
[im 10/17]
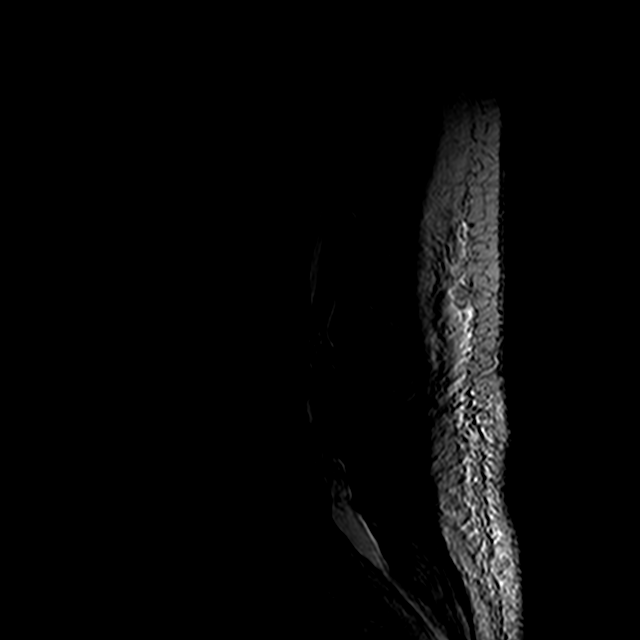
[im 17/17]
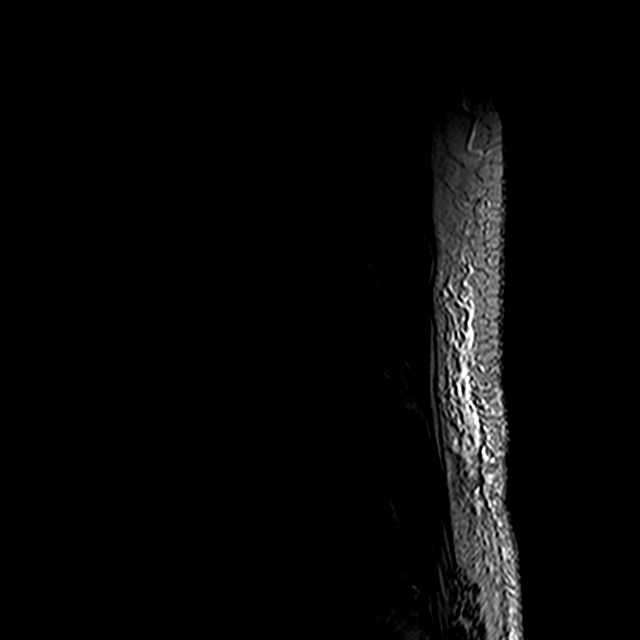

[Series 5: T1 · sagittal · 4.0mm · 0.44mm/px · 3 of 17 slices shown (1 of 2)]
[im 3/17]
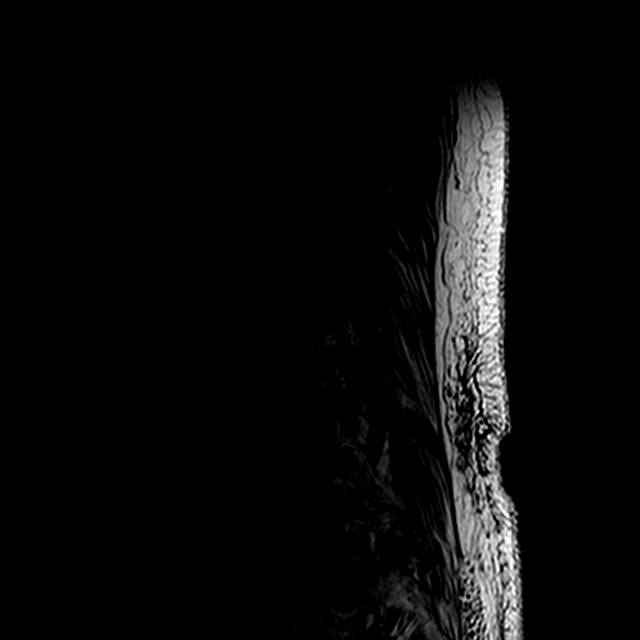
[im 9/17]
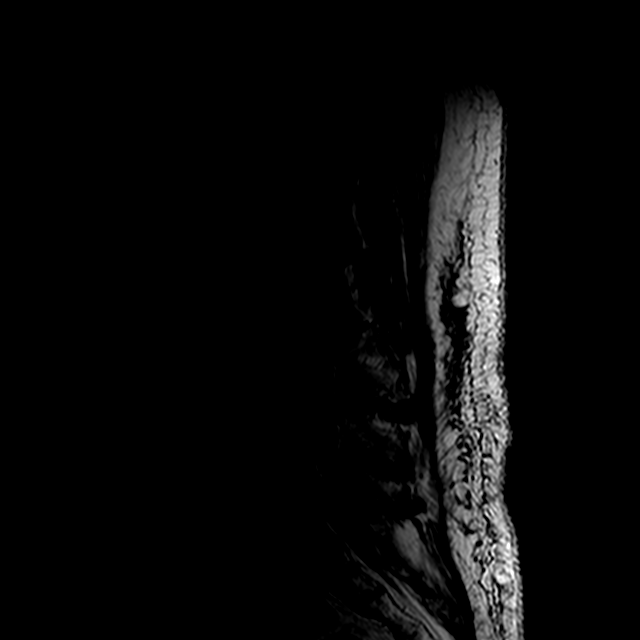
[im 14/17]
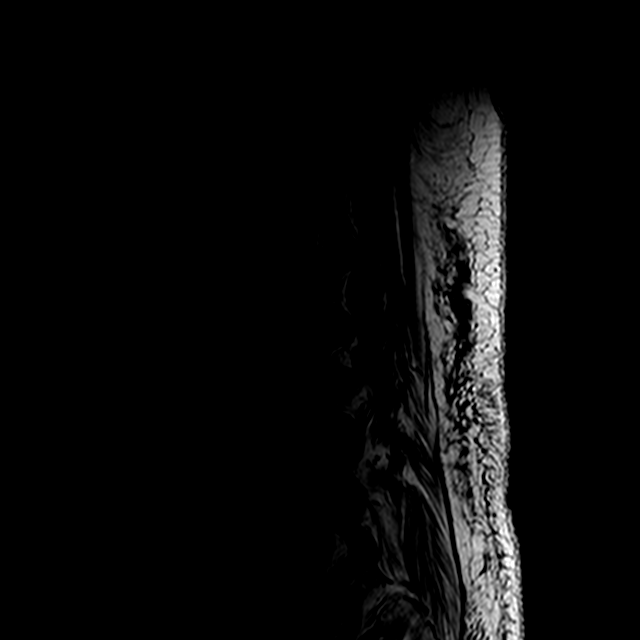

[Series 7: T2 · axial · 4.0mm · 0.39mm/px · z∈[-56,+86]mm · 3 of 37 slices shown (2 of 2)]
[im 6/37]
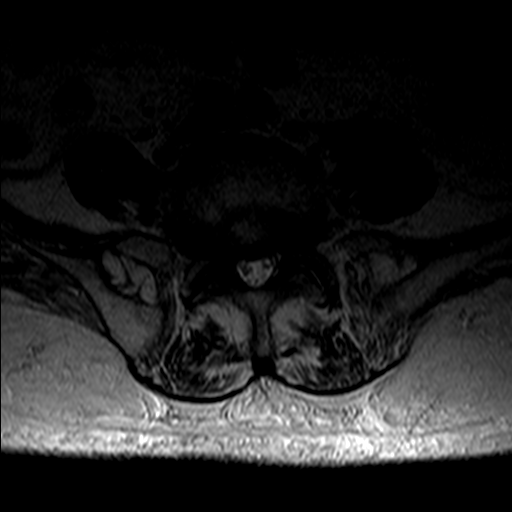
[im 20/37]
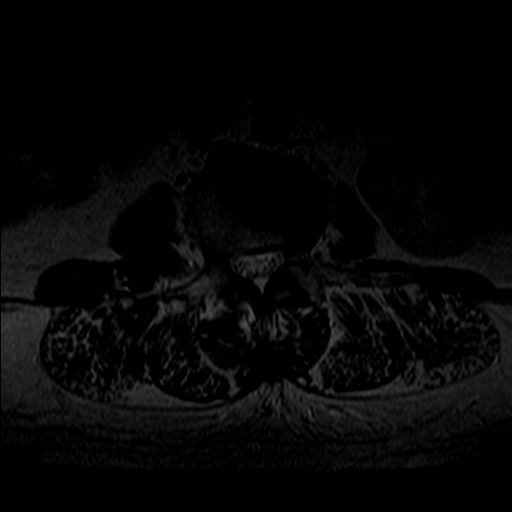
[im 31/37]
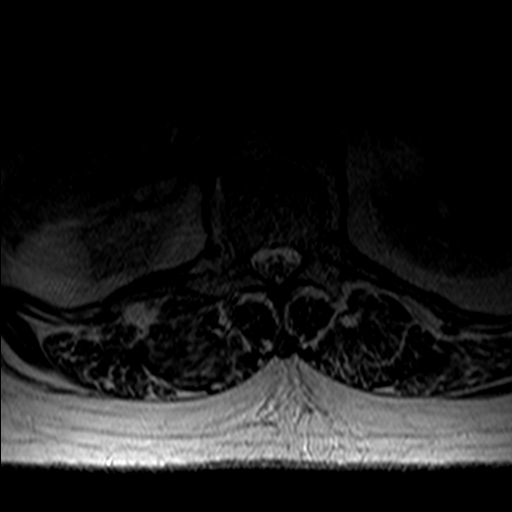

[Series 8: T1 · axial · 4.0mm · 0.39mm/px · z∈[-56,+86]mm · 3 of 37 slices shown (2 of 2)]
[im 6/37]
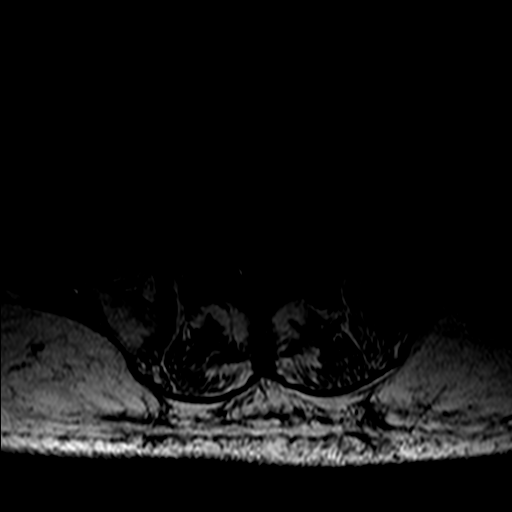
[im 20/37]
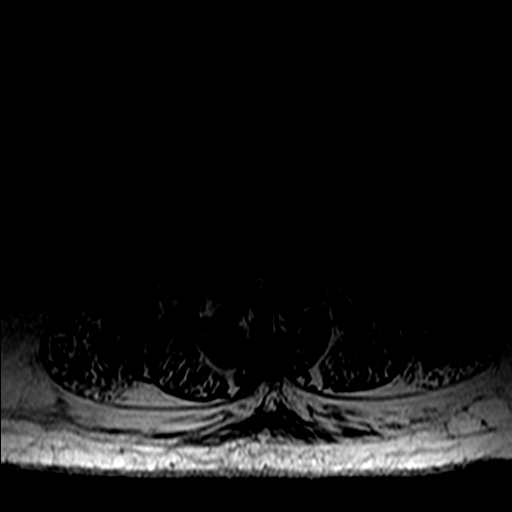
[im 31/37]
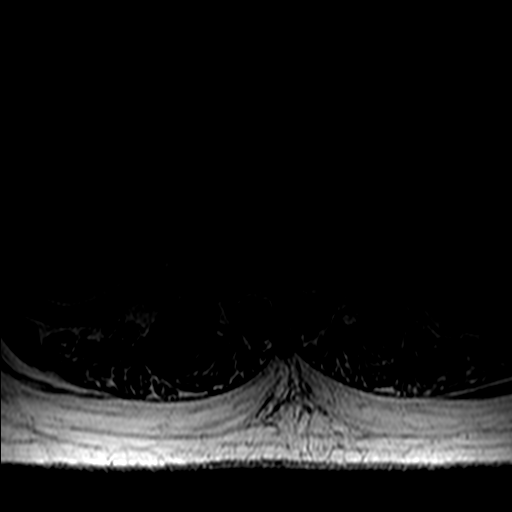

[14 of 48 positions shown; findings below may reference images not displayed]

FINDINGS: Segmentation: Conventional anatomy assumed, with the last open disc
space designated L5-S1.

Alignment: Mild convex right scoliosis. The lateral alignment is
normal.

Vertebrae: No worrisome osseous lesion, acute fracture or pars
defect. There are chronic endplate degenerative changes at the lower
3 levels. The lumbar pedicles are diffusely short on a congenital
basis. The visualized sacroiliac joints appear unremarkable.

Conus medullaris: Extends to the L1-2 level and appears normal.

Paraspinal and other soft tissues: No significant paraspinal
findings. There are bilateral renal cysts.

Disc levels:

Sagittal images demonstrate disc degeneration and posterior disc
bulging at T10-11 and T11-12. There is no resulting cord deformity
or high-grade foraminal narrowing. Appearance is similar to previous
MRI.

No significant disc space findings at T12-L1 or L1-2.

L2-3: Disc height is relatively maintained. There is mild disc
bulging eccentric to the right and mild bilateral facet hypertrophy.
No evidence of spinal stenosis or nerve root encroachment.

L3-4: Chronic degenerative disc disease with loss of disc height,
annular disc bulging and circumferential endplate osteophytes.
Relatively mild facet and ligamentous hypertrophy. These factors
contribute to severe spinal stenosis which appears mildly
progressive from the prior study. There is narrowing of the lateral
recesses as well as mild foraminal narrowing, left greater than
right.

L4-5: Severe multifactorial spinal stenosis secondary to chronic
degenerative disc disease with loss of disc height, annular disc
bulging and endplate osteophytes asymmetric to the left. There is
moderate facet and ligamentous hypertrophy. There is mild right and
moderate to severe left foraminal narrowing which appears chronic.

L5-S1: Chronic degenerative disc disease with loss of disc height,
annular disc bulging and endplate osteophytes asymmetric to the
right. Mild bilateral facet hypertrophy. These factors contribute to
mild spinal stenosis, asymmetric narrowing of the right lateral
recess and moderate foraminal narrowing, worse on the left.
Appearance is unchanged.
IMPRESSION: 1. Severe chronic disc and endplate degeneration from L3-4 through
L5-S1, slowly progressive from previous MRI.
2. Resulting severe multifactorial spinal stenosis at L3-4 and L4-5.
3. Disc bulging and endplate osteophytes contribute to
mild-to-moderate foraminal narrowing at multiple levels as detailed
above. This appears worst on the left at the L4-5 and L5-S1 levels.
4. Disc bulging at T10-11 and T11-12 appears unchanged based on
sagittal imaging.

## 2018-11-12 DIAGNOSIS — M79601 Pain in right arm: Secondary | ICD-10-CM | POA: Insufficient documentation

## 2018-12-17 DIAGNOSIS — R001 Bradycardia, unspecified: Secondary | ICD-10-CM | POA: Insufficient documentation

## 2018-12-17 DIAGNOSIS — I4892 Unspecified atrial flutter: Secondary | ICD-10-CM | POA: Insufficient documentation

## 2018-12-17 DIAGNOSIS — M726 Necrotizing fasciitis: Secondary | ICD-10-CM | POA: Insufficient documentation

## 2018-12-26 DIAGNOSIS — Z720 Tobacco use: Secondary | ICD-10-CM | POA: Insufficient documentation

## 2018-12-26 DIAGNOSIS — F17200 Nicotine dependence, unspecified, uncomplicated: Secondary | ICD-10-CM | POA: Insufficient documentation

## 2019-05-10 ENCOUNTER — Ambulatory Visit: Payer: Medicare HMO | Attending: Internal Medicine

## 2019-05-10 DIAGNOSIS — Z23 Encounter for immunization: Secondary | ICD-10-CM

## 2019-05-10 NOTE — Progress Notes (Signed)
   Covid-19 Vaccination Clinic  Name:  CECE CHRISTOPHE    MRN: [id number] DOB: [birth year]  05/10/2019  Ms. Owsley was observed post Covid-19 immunization for 15 minutes without incident. She was provided with Vaccine Information Sheet and instruction to access the V-Safe system.   Ms. Archuleta was instructed to call 911 with any severe reactions post vaccine: Marland Kitchen Difficulty breathing  . Swelling of face and throat  . A fast heartbeat  . A bad rash all over body  . Dizziness and weakness   Immunizations Administered    Name Date Dose VIS Date Route   Pfizer COVID-19 Vaccine 05/10/2019  1:16 PM 0.3 mL 01/31/2019 Intramuscular   Manufacturer: [place]   Lot: [id number]   [place]: [id number]

## 2019-06-03 ENCOUNTER — Ambulatory Visit: Payer: Medicare HMO | Attending: Internal Medicine

## 2019-06-03 DIAGNOSIS — Z23 Encounter for immunization: Secondary | ICD-10-CM

## 2019-06-03 NOTE — Progress Notes (Signed)
   Covid-19 Vaccination Clinic  Name:  SOMONE MURAT    MRN: [id number] DOB: 12/03/1940  06/03/2019  [name] was observed post Covid-19 immunization for 15 minutes without incident. She was provided with Vaccine Information Sheet and instruction to access the V-Safe system.   [name] was instructed to call 911 with any severe reactions post vaccine: Marland Kitchen Difficulty breathing  . Swelling of face and throat  . A fast heartbeat  . A bad rash all over body  . Dizziness and weakness   Immunizations Administered    Name Date Dose VIS Date Route   Pfizer COVID-19 Vaccine 06/03/2019 12:50 PM 0.3 mL 01/31/2019 Intramuscular   Manufacturer: [place]   Lot: [id number]   [place]: [id number]

## 2019-08-14 DIAGNOSIS — G5601 Carpal tunnel syndrome, right upper limb: Secondary | ICD-10-CM | POA: Insufficient documentation

## 2020-01-12 ENCOUNTER — Other Ambulatory Visit: Payer: Self-pay

## 2020-05-21 DIAGNOSIS — M5136 Other intervertebral disc degeneration, lumbar region: Secondary | ICD-10-CM | POA: Insufficient documentation

## 2020-08-17 DIAGNOSIS — E039 Hypothyroidism, unspecified: Secondary | ICD-10-CM | POA: Diagnosis not present

## 2020-08-17 DIAGNOSIS — E559 Vitamin D deficiency, unspecified: Secondary | ICD-10-CM | POA: Diagnosis not present

## 2020-08-17 DIAGNOSIS — I10 Essential (primary) hypertension: Secondary | ICD-10-CM | POA: Diagnosis not present

## 2020-08-17 DIAGNOSIS — E782 Mixed hyperlipidemia: Secondary | ICD-10-CM | POA: Diagnosis not present

## 2020-08-17 DIAGNOSIS — K219 Gastro-esophageal reflux disease without esophagitis: Secondary | ICD-10-CM | POA: Diagnosis not present

## 2020-08-17 DIAGNOSIS — E119 Type 2 diabetes mellitus without complications: Secondary | ICD-10-CM | POA: Diagnosis not present

## 2020-09-17 DIAGNOSIS — H401121 Primary open-angle glaucoma, left eye, mild stage: Secondary | ICD-10-CM | POA: Diagnosis not present

## 2020-09-24 DIAGNOSIS — F17210 Nicotine dependence, cigarettes, uncomplicated: Secondary | ICD-10-CM | POA: Diagnosis not present

## 2020-09-24 DIAGNOSIS — I10 Essential (primary) hypertension: Secondary | ICD-10-CM | POA: Diagnosis not present

## 2020-09-24 DIAGNOSIS — R0789 Other chest pain: Secondary | ICD-10-CM | POA: Diagnosis not present

## 2020-09-24 DIAGNOSIS — I2510 Atherosclerotic heart disease of native coronary artery without angina pectoris: Secondary | ICD-10-CM | POA: Diagnosis not present

## 2020-09-24 DIAGNOSIS — R0602 Shortness of breath: Secondary | ICD-10-CM | POA: Diagnosis not present

## 2020-09-24 DIAGNOSIS — G4733 Obstructive sleep apnea (adult) (pediatric): Secondary | ICD-10-CM | POA: Diagnosis not present

## 2020-09-24 DIAGNOSIS — E782 Mixed hyperlipidemia: Secondary | ICD-10-CM | POA: Diagnosis not present

## 2020-10-01 DIAGNOSIS — R0789 Other chest pain: Secondary | ICD-10-CM | POA: Diagnosis not present

## 2020-10-13 DIAGNOSIS — Z1159 Encounter for screening for other viral diseases: Secondary | ICD-10-CM | POA: Diagnosis not present

## 2020-10-22 DIAGNOSIS — D485 Neoplasm of uncertain behavior of skin: Secondary | ICD-10-CM | POA: Diagnosis not present

## 2020-10-22 DIAGNOSIS — L668 Other cicatricial alopecia: Secondary | ICD-10-CM | POA: Diagnosis not present

## 2020-10-22 DIAGNOSIS — L298 Other pruritus: Secondary | ICD-10-CM | POA: Diagnosis not present

## 2020-10-22 DIAGNOSIS — L669 Cicatricial alopecia, unspecified: Secondary | ICD-10-CM | POA: Diagnosis not present

## 2020-10-22 DIAGNOSIS — L821 Other seborrheic keratosis: Secondary | ICD-10-CM | POA: Diagnosis not present

## 2020-10-22 DIAGNOSIS — L720 Epidermal cyst: Secondary | ICD-10-CM | POA: Diagnosis not present

## 2020-10-29 DIAGNOSIS — H401121 Primary open-angle glaucoma, left eye, mild stage: Secondary | ICD-10-CM | POA: Diagnosis not present

## 2020-10-29 DIAGNOSIS — H1811 Bullous keratopathy, right eye: Secondary | ICD-10-CM | POA: Diagnosis not present

## 2020-11-05 DIAGNOSIS — L669 Cicatricial alopecia, unspecified: Secondary | ICD-10-CM | POA: Diagnosis not present

## 2020-11-05 DIAGNOSIS — L298 Other pruritus: Secondary | ICD-10-CM | POA: Diagnosis not present

## 2020-11-05 DIAGNOSIS — R208 Other disturbances of skin sensation: Secondary | ICD-10-CM | POA: Diagnosis not present

## 2020-11-12 DIAGNOSIS — Z23 Encounter for immunization: Secondary | ICD-10-CM | POA: Diagnosis not present

## 2020-11-12 DIAGNOSIS — H1811 Bullous keratopathy, right eye: Secondary | ICD-10-CM | POA: Diagnosis not present

## 2020-11-12 DIAGNOSIS — E782 Mixed hyperlipidemia: Secondary | ICD-10-CM | POA: Diagnosis not present

## 2020-11-12 DIAGNOSIS — E119 Type 2 diabetes mellitus without complications: Secondary | ICD-10-CM | POA: Diagnosis not present

## 2020-11-12 DIAGNOSIS — F32A Depression, unspecified: Secondary | ICD-10-CM | POA: Diagnosis not present

## 2020-11-12 DIAGNOSIS — I10 Essential (primary) hypertension: Secondary | ICD-10-CM | POA: Diagnosis not present

## 2020-11-12 DIAGNOSIS — G4733 Obstructive sleep apnea (adult) (pediatric): Secondary | ICD-10-CM | POA: Diagnosis not present

## 2020-12-21 DIAGNOSIS — H209 Unspecified iridocyclitis: Secondary | ICD-10-CM | POA: Diagnosis not present

## 2020-12-23 DIAGNOSIS — H209 Unspecified iridocyclitis: Secondary | ICD-10-CM | POA: Diagnosis not present

## 2020-12-30 DIAGNOSIS — H1811 Bullous keratopathy, right eye: Secondary | ICD-10-CM | POA: Diagnosis not present

## 2021-01-31 DIAGNOSIS — H1811 Bullous keratopathy, right eye: Secondary | ICD-10-CM | POA: Diagnosis not present

## 2021-02-04 DIAGNOSIS — R208 Other disturbances of skin sensation: Secondary | ICD-10-CM | POA: Diagnosis not present

## 2021-02-04 DIAGNOSIS — L669 Cicatricial alopecia, unspecified: Secondary | ICD-10-CM | POA: Diagnosis not present

## 2021-03-03 DIAGNOSIS — H1811 Bullous keratopathy, right eye: Secondary | ICD-10-CM | POA: Diagnosis not present

## 2021-03-07 DIAGNOSIS — I480 Paroxysmal atrial fibrillation: Secondary | ICD-10-CM | POA: Diagnosis not present

## 2021-03-07 DIAGNOSIS — I10 Essential (primary) hypertension: Secondary | ICD-10-CM | POA: Diagnosis not present

## 2021-03-07 DIAGNOSIS — E782 Mixed hyperlipidemia: Secondary | ICD-10-CM | POA: Diagnosis not present

## 2021-03-07 DIAGNOSIS — G4733 Obstructive sleep apnea (adult) (pediatric): Secondary | ICD-10-CM | POA: Diagnosis not present

## 2021-03-07 DIAGNOSIS — F32A Depression, unspecified: Secondary | ICD-10-CM | POA: Diagnosis not present

## 2021-03-07 DIAGNOSIS — E119 Type 2 diabetes mellitus without complications: Secondary | ICD-10-CM | POA: Diagnosis not present

## 2021-09-20 DIAGNOSIS — M48062 Spinal stenosis, lumbar region with neurogenic claudication: Secondary | ICD-10-CM | POA: Diagnosis not present

## 2021-09-20 DIAGNOSIS — M5416 Radiculopathy, lumbar region: Secondary | ICD-10-CM | POA: Diagnosis not present

## 2021-10-11 DIAGNOSIS — H1811 Bullous keratopathy, right eye: Secondary | ICD-10-CM | POA: Diagnosis not present

## 2021-10-11 DIAGNOSIS — H401121 Primary open-angle glaucoma, left eye, mild stage: Secondary | ICD-10-CM | POA: Diagnosis not present

## 2021-10-11 DIAGNOSIS — H2513 Age-related nuclear cataract, bilateral: Secondary | ICD-10-CM | POA: Diagnosis not present

## 2021-10-17 DIAGNOSIS — Z87891 Personal history of nicotine dependence: Secondary | ICD-10-CM | POA: Diagnosis not present

## 2021-10-17 DIAGNOSIS — E7800 Pure hypercholesterolemia, unspecified: Secondary | ICD-10-CM | POA: Diagnosis not present

## 2021-10-17 DIAGNOSIS — E1140 Type 2 diabetes mellitus with diabetic neuropathy, unspecified: Secondary | ICD-10-CM | POA: Diagnosis not present

## 2021-10-17 DIAGNOSIS — M79604 Pain in right leg: Secondary | ICD-10-CM | POA: Diagnosis not present

## 2021-10-17 DIAGNOSIS — L03115 Cellulitis of right lower limb: Secondary | ICD-10-CM | POA: Diagnosis not present

## 2021-10-17 DIAGNOSIS — Z7984 Long term (current) use of oral hypoglycemic drugs: Secondary | ICD-10-CM | POA: Diagnosis not present

## 2021-10-17 DIAGNOSIS — Z8739 Personal history of other diseases of the musculoskeletal system and connective tissue: Secondary | ICD-10-CM | POA: Diagnosis not present

## 2021-10-17 DIAGNOSIS — F1090 Alcohol use, unspecified, uncomplicated: Secondary | ICD-10-CM | POA: Diagnosis not present

## 2021-10-17 DIAGNOSIS — I10 Essential (primary) hypertension: Secondary | ICD-10-CM | POA: Diagnosis not present

## 2021-10-17 DIAGNOSIS — Z7982 Long term (current) use of aspirin: Secondary | ICD-10-CM | POA: Diagnosis not present

## 2021-10-17 DIAGNOSIS — M7989 Other specified soft tissue disorders: Secondary | ICD-10-CM | POA: Diagnosis not present

## 2021-10-17 DIAGNOSIS — Z794 Long term (current) use of insulin: Secondary | ICD-10-CM | POA: Diagnosis not present

## 2021-10-17 DIAGNOSIS — Z79899 Other long term (current) drug therapy: Secondary | ICD-10-CM | POA: Diagnosis not present

## 2021-10-17 DIAGNOSIS — F32A Depression, unspecified: Secondary | ICD-10-CM | POA: Diagnosis not present

## 2021-11-11 DIAGNOSIS — E119 Type 2 diabetes mellitus without complications: Secondary | ICD-10-CM | POA: Diagnosis not present

## 2021-11-11 DIAGNOSIS — R69 Illness, unspecified: Secondary | ICD-10-CM | POA: Diagnosis not present

## 2021-11-11 DIAGNOSIS — M5137 Other intervertebral disc degeneration, lumbosacral region: Secondary | ICD-10-CM | POA: Diagnosis not present

## 2021-11-11 DIAGNOSIS — I10 Essential (primary) hypertension: Secondary | ICD-10-CM | POA: Diagnosis not present

## 2021-11-11 DIAGNOSIS — F32A Depression, unspecified: Secondary | ICD-10-CM | POA: Diagnosis not present

## 2021-11-11 DIAGNOSIS — G4733 Obstructive sleep apnea (adult) (pediatric): Secondary | ICD-10-CM | POA: Diagnosis not present

## 2021-11-11 DIAGNOSIS — M2550 Pain in unspecified joint: Secondary | ICD-10-CM | POA: Diagnosis not present

## 2021-11-11 DIAGNOSIS — E782 Mixed hyperlipidemia: Secondary | ICD-10-CM | POA: Diagnosis not present

## 2021-11-25 DIAGNOSIS — M542 Cervicalgia: Secondary | ICD-10-CM | POA: Diagnosis not present

## 2021-11-25 DIAGNOSIS — M5416 Radiculopathy, lumbar region: Secondary | ICD-10-CM | POA: Diagnosis not present

## 2021-11-25 DIAGNOSIS — M5412 Radiculopathy, cervical region: Secondary | ICD-10-CM | POA: Diagnosis not present

## 2021-11-25 DIAGNOSIS — H1811 Bullous keratopathy, right eye: Secondary | ICD-10-CM | POA: Diagnosis not present

## 2021-11-25 DIAGNOSIS — M5030 Other cervical disc degeneration, unspecified cervical region: Secondary | ICD-10-CM | POA: Diagnosis not present

## 2021-11-25 DIAGNOSIS — M5136 Other intervertebral disc degeneration, lumbar region: Secondary | ICD-10-CM | POA: Diagnosis not present

## 2021-11-29 DIAGNOSIS — H2512 Age-related nuclear cataract, left eye: Secondary | ICD-10-CM | POA: Diagnosis not present

## 2021-12-09 ENCOUNTER — Encounter: Payer: Self-pay | Admitting: Ophthalmology

## 2021-12-13 NOTE — Discharge Instructions (Signed)

## 2021-12-14 ENCOUNTER — Encounter: Payer: Self-pay | Admitting: Ophthalmology

## 2021-12-14 ENCOUNTER — Encounter
Admission: RE | Disposition: A | Discharge status: Home / Self Care | Payer: Self-pay | Source: Home / Self Care | Attending: Ophthalmology

## 2021-12-14 ENCOUNTER — Ambulatory Visit
Admission: RE | Admit: 2021-12-14 | Discharge: 2021-12-14 | Disposition: A | Discharge status: Home / Self Care | Payer: Medicare Other | Attending: Ophthalmology | Admitting: Ophthalmology

## 2021-12-14 ENCOUNTER — Ambulatory Visit: Payer: Medicare Other | Admitting: Anesthesiology

## 2021-12-14 ENCOUNTER — Other Ambulatory Visit: Payer: Self-pay

## 2021-12-14 DIAGNOSIS — J449 Chronic obstructive pulmonary disease, unspecified: Secondary | ICD-10-CM | POA: Diagnosis not present

## 2021-12-14 DIAGNOSIS — F32A Depression, unspecified: Secondary | ICD-10-CM | POA: Insufficient documentation

## 2021-12-14 DIAGNOSIS — I10 Essential (primary) hypertension: Secondary | ICD-10-CM | POA: Diagnosis not present

## 2021-12-14 DIAGNOSIS — G4730 Sleep apnea, unspecified: Secondary | ICD-10-CM | POA: Diagnosis not present

## 2021-12-14 DIAGNOSIS — H2512 Age-related nuclear cataract, left eye: Secondary | ICD-10-CM | POA: Insufficient documentation

## 2021-12-14 DIAGNOSIS — E1136 Type 2 diabetes mellitus with diabetic cataract: Secondary | ICD-10-CM | POA: Insufficient documentation

## 2021-12-14 DIAGNOSIS — E119 Type 2 diabetes mellitus without complications: Secondary | ICD-10-CM | POA: Diagnosis not present

## 2021-12-14 DIAGNOSIS — F17210 Nicotine dependence, cigarettes, uncomplicated: Secondary | ICD-10-CM | POA: Insufficient documentation

## 2021-12-14 DIAGNOSIS — Z794 Long term (current) use of insulin: Secondary | ICD-10-CM | POA: Diagnosis not present

## 2021-12-14 DIAGNOSIS — E1151 Type 2 diabetes mellitus with diabetic peripheral angiopathy without gangrene: Secondary | ICD-10-CM | POA: Diagnosis not present

## 2021-12-14 HISTORY — DX: Presence of dental prosthetic device (complete) (partial): Z97.2

## 2021-12-14 HISTORY — PX: CATARACT EXTRACTION W/PHACO: SHX586

## 2021-12-14 LAB — GLUCOSE, CAPILLARY: Glucose-Capillary: 105 mg/dL — ABNORMAL HIGH (ref 70–99)

## 2021-12-14 SURGERY — PHACOEMULSIFICATION, CATARACT, WITH IOL INSERTION
Anesthesia: Monitor Anesthesia Care | Site: Eye | Laterality: Left

## 2021-12-14 MED ORDER — SIGHTPATH DOSE#1 BSS IO SOLN
INTRAOCULAR | Status: DC | PRN
  Administered 2021-12-14: 15 mL

## 2021-12-14 MED ORDER — FENTANYL CITRATE (PF) 100 MCG/2ML IJ SOLN
INTRAMUSCULAR | Status: DC | PRN
  Administered 2021-12-14 (×2): 50 ug via INTRAVENOUS

## 2021-12-14 MED ORDER — SIGHTPATH DOSE#1 BSS IO SOLN
INTRAOCULAR | Status: DC | PRN
  Administered 2021-12-14: 1 mL via INTRAMUSCULAR

## 2021-12-14 MED ORDER — SIGHTPATH DOSE#1 BSS IO SOLN
INTRAOCULAR | Status: DC | PRN
  Administered 2021-12-14: 103 mL via OPHTHALMIC

## 2021-12-14 MED ORDER — LACTATED RINGERS IV SOLN: INTRAVENOUS | Status: DC

## 2021-12-14 MED ORDER — SIGHTPATH DOSE#1 NA HYALUR & NA CHOND-NA HYALUR IO KIT
PACK | INTRAOCULAR | Status: DC | PRN
  Administered 2021-12-14: 1 via OPHTHALMIC

## 2021-12-14 MED ORDER — MIDAZOLAM HCL 2 MG/2ML IJ SOLN
INTRAMUSCULAR | Status: DC | PRN
  Administered 2021-12-14: 1 mg via INTRAVENOUS

## 2021-12-14 MED ORDER — TETRACAINE HCL 0.5 % OP SOLN
1.0000 [drp] | OPHTHALMIC | Status: DC | PRN
  Administered 2021-12-14 (×3): 1 [drp] via OPHTHALMIC

## 2021-12-14 MED ORDER — BRIMONIDINE TARTRATE-TIMOLOL 0.2-0.5 % OP SOLN
OPHTHALMIC | Status: DC | PRN
  Administered 2021-12-14: 1 [drp] via OPHTHALMIC

## 2021-12-14 MED ORDER — CEFUROXIME OPHTHALMIC INJECTION 1 MG/0.1 ML
INJECTION | OPHTHALMIC | Status: DC | PRN
  Administered 2021-12-14: 0.1 mL via INTRACAMERAL

## 2021-12-14 MED ORDER — ARMC OPHTHALMIC DILATING DROPS
1.0000 | OPHTHALMIC | Status: DC | PRN
  Administered 2021-12-14 (×3): 1 via OPHTHALMIC

## 2021-12-14 SURGICAL SUPPLY — 11 items
CATARACT SUITE SIGHTPATH (MISCELLANEOUS) ×1 IMPLANT
FEE CATARACT SUITE SIGHTPATH (MISCELLANEOUS) ×1 IMPLANT
GLOVE SRG 8 PF TXTR STRL LF DI (GLOVE) ×1 IMPLANT
GLOVE SURG ENC TEXT LTX SZ7.5 (GLOVE) ×1 IMPLANT
GLOVE SURG UNDER POLY LF SZ8 (GLOVE) ×1
LENS IOL TECNIS EYHANCE 24.5 (Intraocular Lens) IMPLANT
NDL FILTER BLUNT 18X1 1/2 (NEEDLE) ×1 IMPLANT
NEEDLE FILTER BLUNT 18X1 1/2 (NEEDLE) ×1 IMPLANT
RING MALYGIN 7.0 (MISCELLANEOUS) IMPLANT
SYR 3ML LL SCALE MARK (SYRINGE) ×1 IMPLANT
WATER STERILE IRR 250ML POUR (IV SOLUTION) ×1 IMPLANT

## 2021-12-14 NOTE — Op Note (Signed)
OPERATIVE NOTE  Wanda Gardner [id number] 12/14/2021   PREOPERATIVE DIAGNOSIS:  Nuclear sclerotic cataract left eye. H25.12   POSTOPERATIVE DIAGNOSIS:    Nuclear sclerotic cataract left eye.     PROCEDURE:  Phacoemusification with posterior chamber intraocular lens placement of the left eye  Ultrasound time: Procedure(s) with comments: CATARACT EXTRACTION PHACO AND INTRAOCULAR LENS PLACEMENT (IOC) LEFT DIABETIC 24.79 02:23.9 (Left) - Diabetic  LENS:   Implant Name Type Inv. Item Serial No. Manufacturer Lot No. LRB No. Used Action  LENS IOL TECNIS EYHANCE 24.5 - [id number] Intraocular Lens LENS IOL TECNIS EYHANCE 24.5 [id number] [employer]  Left 1 Implanted      SURGEON:  Wyonia Hough, MD   ANESTHESIA:  Topical with tetracaine drops and 2% Xylocaine jelly, augmented with 1% preservative-free intracameral lidocaine.    COMPLICATIONS:  None.   DESCRIPTION OF PROCEDURE:  The patient was identified in the holding room and transported to the operating room and placed in the supine position under the operating microscope.  The left eye was identified as the operative eye and it was prepped and draped in the usual sterile ophthalmic fashion.   A 1 millimeter clear-corneal paracentesis was made at the 1:30 position.  0.5 ml of preservative-free 1% lidocaine was injected into the anterior chamber.  The anterior chamber was filled with Viscoat viscoelastic.  A 2.4 millimeter keratome was used to make a near-clear corneal incision at the 10:30 position.  .  A curvilinear capsulorrhexis was made with a cystotome and capsulorrhexis forceps.  Balanced salt solution was used to hydrodissect and hydrodelineate the nucleus.   Phacoemulsification was then used in stop and chop fashion to remove the lens nucleus and epinucleus.  The remaining cortex was then removed using the irrigation and aspiration handpiece. Provisc was then placed into the capsular bag to distend it for lens placement.  A  lens was then injected into the capsular bag.  The remaining viscoelastic was aspirated.   Wounds were hydrated with balanced salt solution.  The anterior chamber was inflated to a physiologic pressure with balanced salt solution.  No wound leaks were noted. Cefuroxime 0.1 ml of a '10mg'$ /ml solution was injected into the anterior chamber for a dose of 1 mg of intracameral antibiotic at the completion of the case.   Timolol and Brimonidine drops were applied to the eye.  The patient was taken to the recovery room in stable condition without complications of anesthesia or surgery.  [name] 12/14/2021, 11:18 AM

## 2021-12-14 NOTE — Transfer of Care (Signed)
Immediate Anesthesia Transfer of Care Note  Patient: Wanda Gardner  Procedure(s) Performed: CATARACT EXTRACTION PHACO AND INTRAOCULAR LENS PLACEMENT (IOC) LEFT DIABETIC 24.79 02:23.9 (Left: Eye)  Patient Location: PACU  Anesthesia Type: MAC  Level of Consciousness: awake, alert  and patient cooperative  Airway and Oxygen Therapy: Patient Spontanous Breathing and Patient connected to supplemental oxygen  Post-op Assessment: Post-op Vital signs reviewed, Patient's Cardiovascular Status Stable, Respiratory Function Stable, Patent Airway and No signs of Nausea or vomiting  Post-op Vital Signs: Reviewed and stable  Complications: No notable events documented.

## 2021-12-14 NOTE — Anesthesia Preprocedure Evaluation (Signed)
Anesthesia Evaluation  Patient identified by MRN, date of birth, ID band Patient awake    Reviewed: Allergy & Precautions, H&P , NPO status , Patient's Chart, lab work & pertinent test results, reviewed documented beta blocker date and time   History of Anesthesia Complications (+) PONV and history of anesthetic complications  Airway Mallampati: IV  TM Distance: >3 FB Neck ROM: full    Dental  (+) Edentulous Upper, Edentulous Lower, Dental Advidsory Given   Pulmonary shortness of breath and with exertion, asthma , sleep apnea , COPD,  COPD inhaler, neg recent URI, Current Smoker and Patient abstained from smoking.,    Pulmonary exam normal breath sounds clear to auscultation       Cardiovascular Exercise Tolerance: Good hypertension, (-) angina+ Peripheral Vascular Disease  (-) CAD, (-) Past MI, (-) Cardiac Stents and (-) CABG Normal cardiovascular exam+ dysrhythmias (-) Valvular Problems/Murmurs Rhythm:regular Rate:Normal     Neuro/Psych PSYCHIATRIC DISORDERS Depression negative neurological ROS     GI/Hepatic Neg liver ROS, GERD  ,  Endo/Other  diabetes, Insulin Dependent  Renal/GU negative Renal ROS  negative genitourinary   Musculoskeletal   Abdominal   Peds  Hematology negative hematology ROS (+)   Anesthesia Other Findings Past Medical History: No date: Acid reflux No date: Arthritis No date: Asthma No date: COPD (chronic obstructive pulmonary disease) (* No date: Depression No date: Diabetes mellitus without complication (HCC) No date: Glaucoma No date: Headache     Comment: right side of head No date: Hyperlipidemia No date: Hypertension No date: Neuropathic pain of hands and feet 1982: PONV (postoperative nausea and vomiting)     Comment: back with my hysterectomy. No date: Sleep apnea   Reproductive/Obstetrics negative OB ROS                             Anesthesia  Physical  Anesthesia Plan  ASA: 3  Anesthesia Plan: MAC   Post-op Pain Management:    Induction:   PONV Risk Score and Plan: 2 and Midazolam  Airway Management Planned: Natural Airway and Nasal Cannula  Additional Equipment:   Intra-op Plan:   Post-operative Plan:   Informed Consent: I have reviewed the patients History and Physical, chart, labs and discussed the procedure including the risks, benefits and alternatives for the proposed anesthesia with the patient or authorized representative who has indicated his/her understanding and acceptance.     Dental Advisory Given  Plan Discussed with: Anesthesiologist, CRNA and Surgeon  Anesthesia Plan Comments:         Anesthesia Quick Evaluation

## 2021-12-14 NOTE — H&P (Signed)
[place]   Primary Care Physician:  Perrin Maltese, MD Ophthalmologist: Dr. Leandrew Koyanagi  Pre-Procedure History & Physical: HPI:  Wanda Gardner is a 81 y.o. female here for ophthalmic surgery.   Past Medical History:  Diagnosis Date   Acid reflux    Arthritis    Asthma    COPD (chronic obstructive pulmonary disease) (HCC)    Depression    Diabetes mellitus without complication (HCC)    Dyspnea    Glaucoma    Headache    right side of head   Hyperlipidemia    Hypertension    Neuropathic pain of hands and feet    PONV (postoperative nausea and vomiting) 1982   back with my hysterectomy.   Sleep apnea    Doesn't use CPAP   Wears dentures    full upper and lower    Past Surgical History:  Procedure Laterality Date   ABDOMINAL HYSTERECTOMY     CATARACT EXTRACTION W/ INTRAOCULAR LENS  IMPLANT, BILATERAL     CHOLECYSTECTOMY     COLONOSCOPY WITH PROPOFOL N/A 10/29/2014   Procedure: COLONOSCOPY WITH PROPOFOL;  Surgeon: Hulen Luster, MD;  Location: [facility] ENDOSCOPY;  Service: Gastroenterology;  Laterality: N/A;   ESOPHAGOGASTRODUODENOSCOPY (EGD) WITH PROPOFOL N/A 10/29/2014   Procedure: ESOPHAGOGASTRODUODENOSCOPY (EGD) WITH PROPOFOL;  Surgeon: Hulen Luster, MD;  Location: [facility] ENDOSCOPY;  Service: Gastroenterology;  Laterality: N/A;   EYE SURGERY Bilateral 2011   lid lift done 3 times   FOOT SURGERY     HAND SURGERY     TONGUE SURGERY     removal of cancer   TONSILLECTOMY     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  Authorizing Provider  apixaban (ELIQUIS) 5 MG TABS tablet Take 5 mg by mouth 2 (two) times daily.   Yes [provider]  BIOTIN PO Take 1 tablet by mouth daily. 1046mg   Yes [provider]  chlorthalidone (HYGROTON) 25 MG tablet Take 25 mg by mouth daily.   Yes [provider]  Cholecalciferol (VITAMIN D3) 5000 units CAPS Take 5,000 Units by mouth daily.   Yes [provider]  clobetasol (TEMOVATE) 0.05 % external solution Apply 1 application topically 2 (two) times daily. 04/13/16  Yes [provider]  diclofenac sodium (VOLTAREN) 1 % GEL Apply 2 g topically 2 (two) times daily as needed (pain).    Yes [provider]  docusate sodium (COLACE) 100 MG capsule Take 1 capsule (100 mg total) by mouth 2 (two) times daily. 06/16/16  Yes SCarrie Mew MD  dorzolamide-timolol (COSOPT) 2-0.5 % ophthalmic solution Place 1 drop into both eyes 2 (two) times daily.   Yes [provider]  DULoxetine (CYMBALTA) 60 MG capsule Take 60 mg by mouth at bedtime. 03/23/16  Yes [provider]  esomeprazole (NEXIUM) 40 MG capsule Take 40 mg by mouth daily. 05/17/16  Yes [provider]  ferrous sulfate 325 (65 FE) MG tablet Take 325 mg by mouth daily with breakfast.   Yes [provider]  fluticasone (  FLONASE) 50 MCG/ACT nasal spray Place 2 sprays into both nostrils daily as needed for allergies.   Yes [provider]  furosemide (LASIX) 20 MG tablet Take 40 mg by mouth daily. 05/17/16  Yes [provider]  LANTUS SOLOSTAR 100 UNIT/ML Solostar Pen Inject 45 Units into the skin daily at 10 pm.  08/17/14  Yes [provider]  losartan-hydrochlorothiazide (HYZAAR) 100-12.5 MG tablet Take 1 tablet by mouth daily. 05/17/16  Yes [provider]  metFORMIN (GLUCOPHAGE) 500 MG tablet Take 1,000 mg by mouth 2 (two) times daily.   Yes [provider]  NOVOLOG FLEXPEN 100 UNIT/ML FlexPen Inject 0-15  Units into the skin 3 (three) times daily before meals. Sliding scale based on blood glucose levels. 08/14/14  Yes [provider]  polyethylene glycol powder (GLYCOLAX/MIRALAX) powder 2 cap fulls in a full glass of water, two times a day, for 5 days. 06/16/16  Yes Carrie Mew, MD  potassium chloride SA (K-DUR,KLOR-CON) 20 MEQ tablet Take 20 mEq by mouth daily.   Yes [provider]  pravastatin (PRAVACHOL) 40 MG tablet Take 40 mg by mouth at bedtime. 03/24/16  Yes [provider]  prednisoLONE acetate (PRED FORTE) 1 % ophthalmic suspension Place 1 drop into the right eye 2 (two) times daily.   Yes [provider]  VENTOLIN HFA 108 (90 BASE) MCG/ACT inhaler Inhale 2 puffs into the lungs every 6 (six) hours as needed for wheezing or shortness of breath.  07/13/14  Yes [provider]  vitamin B-12 (CYANOCOBALAMIN) 1000 MCG tablet Take 1,000 mcg by mouth daily.   Yes [provider]  Flossie Buffy SHORT PEN NEEDLES 31G X 8 MM [place]  08/14/14   [provider]    Allergies as of 10/26/2021   (No Known Allergies)    Family History  Problem Relation Age of Onset   Cancer Father    Heart disease Mother     Social History   Socioeconomic History   Marital status: Divorced    Spouse name: Not on file   Number of children: Not on file   Years of education: Not on file   Highest education level: Not on file  Occupational History   Not on file  Tobacco Use   Smoking status: Every Day    Packs/day: 0.50    Years: 50.00    Total pack years: 25.00    Types: Cigarettes   Smokeless tobacco: Never  Vaping Use   Vaping Use: Never used  Substance and Sexual Activity   Alcohol use: Yes    Alcohol/week: 0.0 standard drinks of alcohol    Comment: 1 glass of wine twice a year   Drug use: Yes    Types: Marijuana    Comment: weed- daily use for pain relief "when she can get  it"   Sexual activity: Not Currently  Other Topics Concern    Not on file  Social History Narrative   Not on file   Social Determinants of Health   Financial Resource Strain: Not on file  Food Insecurity: Not on file  Transportation Needs: Not on file  Physical Activity: Not on file  Stress: Not on file  Social Connections: Not on file  Intimate Partner Violence: Not on file    Review of Systems: See HPI, otherwise negative ROS  Physical Exam: BP (!) 141/48   Pulse (!) 55   Temp (!) 97.1 F (36.2 C) (Temporal)   Resp 18   Ht 5'  6" (1.676 m)   Wt 83 kg   SpO2 100%   BMI 29.54 kg/m  General:   Alert,  pleasant and cooperative in NAD Head:  Normocephalic and atraumatic. Lungs:  Clear to auscultation.    Heart:  Regular rate and rhythm.   Impression/Plan: BENAY POMEROY is here for ophthalmic surgery.  Risks, benefits, limitations, and alternatives regarding ophthalmic surgery have been reviewed with the patient.  Questions have been answered.  All parties agreeable.   Leandrew Koyanagi, MD  12/14/2021, 10:32 AM

## 2021-12-14 NOTE — Anesthesia Postprocedure Evaluation (Signed)
Anesthesia Post Note  Patient: Wanda Gardner  Procedure(s) Performed: CATARACT EXTRACTION PHACO AND INTRAOCULAR LENS PLACEMENT (IOC) LEFT DIABETIC 24.79 02:23.9 (Left: Eye)  Patient location during evaluation: PACU Anesthesia Type: MAC Level of consciousness: awake and alert Pain management: pain level controlled Vital Signs Assessment: post-procedure vital signs reviewed and stable Respiratory status: spontaneous breathing, nonlabored ventilation, respiratory function stable and patient connected to nasal cannula oxygen Cardiovascular status: stable and blood pressure returned to baseline Postop Assessment: no apparent nausea or vomiting Anesthetic complications: no   No notable events documented.   Last Vitals:  Vitals:   12/14/21 1119 12/14/21 1124  BP: (!) 121/52 (!) 133/57  Pulse: (!) 58 (!) 57  Resp: 11 14  Temp: (!) 36 C (!) 36 C  SpO2: 100% 99%    Last Pain:  Vitals:   12/14/21 1124  TempSrc:   PainSc: 0-No pain                 Martha Clan

## 2021-12-15 ENCOUNTER — Encounter: Payer: Self-pay | Admitting: Ophthalmology

## 2022-01-10 DIAGNOSIS — M65351 Trigger finger, right little finger: Secondary | ICD-10-CM | POA: Diagnosis not present

## 2022-01-10 DIAGNOSIS — E119 Type 2 diabetes mellitus without complications: Secondary | ICD-10-CM

## 2022-01-10 DIAGNOSIS — M65341 Trigger finger, right ring finger: Secondary | ICD-10-CM | POA: Insufficient documentation

## 2022-01-10 DIAGNOSIS — M6530 Trigger finger, unspecified finger: Secondary | ICD-10-CM

## 2022-01-10 HISTORY — DX: Type 2 diabetes mellitus without complications: E11.9

## 2022-01-10 HISTORY — DX: Trigger finger, unspecified finger: M65.30

## 2022-01-19 DIAGNOSIS — I10 Essential (primary) hypertension: Secondary | ICD-10-CM | POA: Diagnosis not present

## 2022-01-19 DIAGNOSIS — E119 Type 2 diabetes mellitus without complications: Secondary | ICD-10-CM | POA: Diagnosis not present

## 2022-01-24 DIAGNOSIS — M48062 Spinal stenosis, lumbar region with neurogenic claudication: Secondary | ICD-10-CM | POA: Diagnosis not present

## 2022-01-24 DIAGNOSIS — M5416 Radiculopathy, lumbar region: Secondary | ICD-10-CM | POA: Diagnosis not present

## 2022-01-25 DIAGNOSIS — M65342 Trigger finger, left ring finger: Secondary | ICD-10-CM | POA: Diagnosis not present

## 2022-02-07 DIAGNOSIS — E1165 Type 2 diabetes mellitus with hyperglycemia: Secondary | ICD-10-CM | POA: Diagnosis not present

## 2022-02-10 DIAGNOSIS — M109 Gout, unspecified: Secondary | ICD-10-CM | POA: Diagnosis not present

## 2022-02-10 DIAGNOSIS — E119 Type 2 diabetes mellitus without complications: Secondary | ICD-10-CM | POA: Diagnosis not present

## 2022-02-10 DIAGNOSIS — M160 Bilateral primary osteoarthritis of hip: Secondary | ICD-10-CM | POA: Diagnosis not present

## 2022-02-10 DIAGNOSIS — E782 Mixed hyperlipidemia: Secondary | ICD-10-CM | POA: Diagnosis not present

## 2022-02-10 DIAGNOSIS — I10 Essential (primary) hypertension: Secondary | ICD-10-CM | POA: Diagnosis not present

## 2022-02-10 DIAGNOSIS — G4733 Obstructive sleep apnea (adult) (pediatric): Secondary | ICD-10-CM | POA: Diagnosis not present

## 2022-02-10 DIAGNOSIS — E1165 Type 2 diabetes mellitus with hyperglycemia: Secondary | ICD-10-CM | POA: Diagnosis not present

## 2022-02-10 DIAGNOSIS — F32A Depression, unspecified: Secondary | ICD-10-CM | POA: Diagnosis not present

## 2022-04-01 ENCOUNTER — Other Ambulatory Visit: Payer: Self-pay | Admitting: Family

## 2022-04-10 ENCOUNTER — Other Ambulatory Visit: Payer: Self-pay | Admitting: Family

## 2022-04-10 ENCOUNTER — Other Ambulatory Visit: Payer: Self-pay | Admitting: Cardiovascular Disease

## 2022-04-20 ENCOUNTER — Other Ambulatory Visit: Payer: Self-pay

## 2022-04-20 MED ORDER — LOSARTAN POTASSIUM 50 MG PO TABS: 50.0000 mg | ORAL_TABLET | Freq: Every day | ORAL | 11 refills | Status: DC

## 2022-04-24 DIAGNOSIS — R32 Unspecified urinary incontinence: Secondary | ICD-10-CM | POA: Diagnosis not present

## 2022-05-12 ENCOUNTER — Encounter: Payer: Self-pay | Admitting: Family

## 2022-05-12 ENCOUNTER — Ambulatory Visit (INDEPENDENT_AMBULATORY_CARE_PROVIDER_SITE_OTHER): Payer: Medicare HMO | Admitting: Family

## 2022-05-12 VITALS — BP 120/64 | HR 62 | Ht 67.0 in | Wt 195.0 lb

## 2022-05-12 DIAGNOSIS — M25669 Stiffness of unspecified knee, not elsewhere classified: Secondary | ICD-10-CM

## 2022-05-12 DIAGNOSIS — N3281 Overactive bladder: Secondary | ICD-10-CM

## 2022-05-12 DIAGNOSIS — E559 Vitamin D deficiency, unspecified: Secondary | ICD-10-CM

## 2022-05-12 DIAGNOSIS — E1165 Type 2 diabetes mellitus with hyperglycemia: Secondary | ICD-10-CM | POA: Diagnosis not present

## 2022-05-12 DIAGNOSIS — E538 Deficiency of other specified B group vitamins: Secondary | ICD-10-CM | POA: Diagnosis not present

## 2022-05-12 DIAGNOSIS — I10 Essential (primary) hypertension: Secondary | ICD-10-CM | POA: Diagnosis not present

## 2022-05-12 DIAGNOSIS — E039 Hypothyroidism, unspecified: Secondary | ICD-10-CM

## 2022-05-12 DIAGNOSIS — M25619 Stiffness of unspecified shoulder, not elsewhere classified: Secondary | ICD-10-CM

## 2022-05-12 DIAGNOSIS — M7520 Bicipital tendinitis, unspecified shoulder: Secondary | ICD-10-CM | POA: Insufficient documentation

## 2022-05-12 DIAGNOSIS — N3942 Incontinence without sensory awareness: Secondary | ICD-10-CM

## 2022-05-12 DIAGNOSIS — E782 Mixed hyperlipidemia: Secondary | ICD-10-CM | POA: Diagnosis not present

## 2022-05-12 HISTORY — DX: Stiffness of unspecified knee, not elsewhere classified: M25.669

## 2022-05-12 HISTORY — DX: Stiffness of unspecified shoulder, not elsewhere classified: M25.619

## 2022-05-12 LAB — POCT CBG (FASTING - GLUCOSE)-MANUAL ENTRY: Glucose Fasting, POC: 59 mg/dL — AB (ref 70–99)

## 2022-05-12 MED ORDER — ISOSORBIDE MONONITRATE ER 30 MG PO TB24: 30.0000 mg | ORAL_TABLET | Freq: Every day | ORAL | 1 refills | Status: DC

## 2022-05-12 MED ORDER — METOPROLOL SUCCINATE ER 50 MG PO TB24: 50.0000 mg | ORAL_TABLET | Freq: Every day | ORAL | 11 refills | Status: DC

## 2022-05-13 ENCOUNTER — Encounter: Payer: Self-pay | Admitting: Family

## 2022-05-13 NOTE — Progress Notes (Addendum)
Established Patient Office Visit  Subjective:  Patient ID: Wanda Gardner, female    DOB: November 27, 1940  Age: 82 y.o. MRN: [id number]  Chief Complaint  Patient presents with   Follow-up    3 month follow up    Patient is here today for her 3 months follow up.  She has been feeling fairly well since last appointment.   She does have additional concerns to discuss today.  She needs refills of her urinary incontinence products.  She does continue to need them, as she has been having continued issues with these and is likely to do so for the rest of her life.    She uses Large pullups, and needs 5 per day, for 150 per month. She also needs underpads for her bed/chairs as she does have some leakage.    Labs are due today. She needs refills.   I have reviewed her active problem list, medication list, allergies, notes from last encounter, lab results for her appointment today.   No other concerns at this time.   Past Medical History:  Diagnosis Date   Acid reflux    AKI (acute kidney injury) (HCC) 06/10/2022   Arthritis    Asthma    Chest pain, unspecified 10/30/2013   Formatting of this note might be different from the original. Note: Unchanged Note: Unchanged Formatting of this note might be different from the original. Note: Unchanged Note: Unchanged   Constipation 04/20/2011   Formatting of this note might be different from the original. Note: Unchanged - probably [facility]. Note: Unchanged - probably [facility].   COPD (chronic obstructive pulmonary disease) (HCC)    Cough 10/30/2013   Formatting of this note might be different from the original. Note: Unchanged Note: Unchanged   Depression    Diabetes mellitus (HCC) 01/10/2022   Diabetes mellitus without complication (HCC)    Dyspnea    Glaucoma    Headache    right side of head   Headache 07/25/2011   Hyperlipidemia    Hypertension    Knee stiff 05/12/2022   Lens replaced 03/22/2010   Neuropathic pain of hands and feet     PONV (postoperative nausea and vomiting) 1982   back with my hysterectomy.   Pseudophakia 03/22/2010   Sleep apnea    Doesn't use CPAP   Stiffness of shoulder joint 05/12/2022   Trigger finger of right hand 01/10/2022   Wears dentures    full upper and lower    Past Surgical History:  Procedure Laterality Date   ABDOMINAL HYSTERECTOMY     CATARACT EXTRACTION W/ INTRAOCULAR LENS  IMPLANT, BILATERAL     CATARACT EXTRACTION W/PHACO Left 12/14/2021   Procedure: CATARACT EXTRACTION PHACO AND INTRAOCULAR LENS PLACEMENT (IOC) LEFT DIABETIC 24.79 02:23.9;  Surgeon: Lockie Mola, MD;  Location: [facility];  Service: Ophthalmology;  Laterality: Left;  Diabetic   CHOLECYSTECTOMY     COLONOSCOPY WITH PROPOFOL N/A 10/29/2014   Procedure: COLONOSCOPY WITH PROPOFOL;  Surgeon: Wallace Cullens, MD;  Location: [facility];  Service: Gastroenterology;  Laterality: N/A;   ESOPHAGOGASTRODUODENOSCOPY (EGD) WITH PROPOFOL N/A 10/29/2014   Procedure: ESOPHAGOGASTRODUODENOSCOPY (EGD) WITH PROPOFOL;  Surgeon: Wallace Cullens, MD;  Location: [facility] ENDOSCOPY;  Service: Gastroenterology;  Laterality: N/A;   EYE SURGERY Bilateral 2011   lid lift done 3 times   FOOT SURGERY     HAND SURGERY     TONGUE SURGERY     removal of cancer   TONSILLECTOMY     TOTAL  HIP ARTHROPLASTY Right 06/13/2016   Procedure: TOTAL HIP ARTHROPLASTY ANTERIOR APPROACH;  Surgeon: Kennedy Bucker, MD;  Location: [facility];  Service: Orthopedics;  Laterality: Right;   TOTAL KNEE ARTHROPLASTY Right 11/23/2015   Procedure: TOTAL KNEE ARTHROPLASTY;  Surgeon: Kennedy Bucker, MD;  Location: [facility];  Service: Orthopedics;  Laterality: Right;   VOCAL CORD LATERALIZATION, ENDOSCOPIC APPROACH W/ MLB      Social History   Socioeconomic History   Marital status: Divorced    Spouse name: Not on file   Number of children: Not on file   Years of education: Not on file   Highest education level: Not on file  Occupational History   Not on file   Tobacco Use   Smoking status: Every Day    Current packs/day: 0.50    Average packs/day: 0.5 packs/day for 50.0 years (25.0 ttl pk-yrs)    Types: Cigarettes   Smokeless tobacco: Never  Vaping Use   Vaping status: Never Used  Substance and Sexual Activity   Alcohol use: Yes    Alcohol/week: 0.0 standard drinks of alcohol    Comment: 1 glass of wine twice a year   Drug use: Yes    Types: Marijuana    Comment: weed- daily use for pain relief "when she can get  it"   Sexual activity: Not Currently  Other Topics Concern   Not on file  Social History Narrative   Not on file   Social Determinants of Health   Financial Resource Strain: Not on file  Food Insecurity: Food Insecurity Present (06/10/2022)   Hunger Vital Sign    Worried About Running Out of Food in the Last Year: Often true    Ran Out of Food in the Last Year: Often true  Transportation Needs: Unmet Transportation Needs (06/10/2022)   PRAPARE - Administrator, Civil Service (Medical): Yes    Lack of Transportation (Non-Medical): No  Physical Activity: Not on file  Stress: Not on file  Social Connections: Not on file  Intimate Partner Violence: Not At Risk (06/10/2022)   Humiliation, Afraid, Rape, and Kick questionnaire    Fear of Current or Ex-Partner: No    Emotionally Abused: No    Physically Abused: No    Sexually Abused: No    Family History  Problem Relation Age of Onset   Cancer Father    Heart disease Mother     No Known Allergies  Review of Systems  Musculoskeletal:  Positive for back pain, falls and joint pain.  All other systems reviewed and are negative.      Objective:   BP 120/64   Pulse 62   Ht 5\' 7"  (1.702 m)   Wt 195 lb (88.5 kg)   SpO2 98%   BMI 30.54 kg/m   Vitals:   05/12/22 1128  BP: 120/64  Pulse: 62  Height: 5\' 7"  (1.702 m)  Weight: 195 lb (88.5 kg)  SpO2: 98%  BMI (Calculated): 30.53    Physical Exam Vitals and nursing note reviewed.  Constitutional:       Appearance: Normal appearance. She is normal weight.  HENT:     Head: Normocephalic.  Eyes:     Pupils: Pupils are equal, round, and reactive to light.  Cardiovascular:     Rate and Rhythm: Normal rate.  Pulmonary:     Effort: Pulmonary effort is normal.  Musculoskeletal:     Thoracic back: Spasms and tenderness present.     Lumbar back: Spasms and  tenderness present. Decreased range of motion. Positive right straight leg raise test and positive left straight leg raise test.     Right hip: Tenderness present. Decreased range of motion. Decreased strength.     Left hip: Tenderness present. Decreased range of motion. Decreased strength.  Neurological:     Mental Status: She is alert and oriented to person, place, and time. Mental status is at baseline.     Motor: Weakness and atrophy present.     Coordination: Coordination abnormal.     Gait: Gait abnormal.      Results for orders placed or performed in visit on 05/12/22  POCT CBG (Fasting - Glucose)  Result Value Ref Range   Glucose Fasting, POC 59 (A) 70 - 99 mg/dL    Recent Results (from the past 2160 hour(s))  POCT CBG (Fasting - Glucose)     Status: Abnormal   Collection Time: 12/14/22  9:48 AM  Result Value Ref Range   Glucose Fasting, POC 106 (A) 70 - 99 mg/dL  POC CREATINE & ALBUMIN,URINE     Status: None   Collection Time: 12/14/22 10:25 AM  Result Value Ref Range   Microalbumin Ur, POC 10 mg/L   Creatinine, POC 100 mg/dL   Albumin/Creatinine Ratio, Urine, POC <30   Lipid panel     Status: None   Collection Time: 12/14/22 10:32 AM  Result Value Ref Range   Cholesterol, Total 140 100 - 199 mg/dL   Triglycerides 72 0 - 149 mg/dL   HDL 47 >45 mg/dL   VLDL Cholesterol Cal 14 5 - 40 mg/dL   LDL Chol Calc (NIH) 79 0 - 99 mg/dL   Chol/HDL Ratio 3.0 0.0 - 4.4 ratio    Comment:                                   T. Chol/HDL Ratio                                             Men  Women                                1/2 Avg.Risk  3.4    3.3                                   Avg.Risk  5.0    4.4                                2X Avg.Risk  9.6    7.1                                3X Avg.Risk 23.4   11.0   VITAMIN D 25 Hydroxy (Vit-D Deficiency, Fractures)     Status: None   Collection Time: 12/14/22 10:32 AM  Result Value Ref Range   Vit D, 25-Hydroxy 67.4 30.0 - 100.0 ng/mL    Comment: Vitamin D deficiency has been defined by the Institute of Medicine and an Endocrine Society practice guideline as a level of  serum 25-OH vitamin D less than 20 ng/mL (1,2). The Endocrine Society went on to further define vitamin D insufficiency as a level between 21 and 29 ng/mL (2). 1. IOM (Institute of Medicine). 2010. Dietary reference    intakes for calcium and D. [place]: The    [employer]. 2. Holick MF, Binkley [place], Bischoff-Ferrari HA, et al.    Evaluation, treatment, and prevention of vitamin D    deficiency: an Endocrine Society clinical practice    guideline. JCEM. 2011 Jul; 96(7):1911-30.   CMP14+EGFR     Status: Abnormal   Collection Time: 12/14/22 10:32 AM  Result Value Ref Range   Glucose 86 70 - 99 mg/dL   BUN 20 8 - 27 mg/dL   Creatinine, Ser 4.27 (H) 0.57 - 1.00 mg/dL   eGFR 44 (L) >06 CB/JSE/8.31   BUN/Creatinine Ratio 16 12 - 28   Sodium 141 134 - 144 mmol/L   Potassium 4.1 3.5 - 5.2 mmol/L   Chloride 100 96 - 106 mmol/L   CO2 27 20 - 29 mmol/L   Calcium 9.6 8.7 - 10.3 mg/dL   Total Protein 6.8 6.0 - 8.5 g/dL   Albumin 4.1 3.8 - 4.8 g/dL   Globulin, Total 2.7 1.5 - 4.5 g/dL   Bilirubin Total 0.2 0.0 - 1.2 mg/dL   Alkaline Phosphatase 89 44 - 121 IU/L   AST 15 0 - 40 IU/L   ALT 11 0 - 32 IU/L  TSH     Status: None   Collection Time: 12/14/22 10:32 AM  Result Value Ref Range   TSH 1.190 0.450 - 4.500 uIU/mL  Hemoglobin A1c     Status: Abnormal   Collection Time: 12/14/22 10:32 AM  Result Value Ref Range   Hgb A1c MFr Bld 7.5 (H) 4.8 - 5.6 %    Comment:           Prediabetes: 5.7 - 6.4          Diabetes: >6.4          Glycemic control for adults with diabetes: <7.0    Est. average glucose Bld gHb Est-mCnc 169 mg/dL  Vitamin D17     Status: None   Collection Time: 12/14/22 10:32 AM  Result Value Ref Range   Vitamin B-12 813 232 - 1,245 pg/mL  CBC with Diff     Status: Abnormal   Collection Time: 12/14/22 10:32 AM  Result Value Ref Range   WBC 5.8 3.4 - 10.8 x10E3/uL   RBC 3.59 (L) 3.77 - 5.28 x10E6/uL   Hemoglobin 9.2 (L) 11.1 - 15.9 g/dL   Hematocrit 61.6 (L) 07.3 - 46.6 %   MCV 82 79 - 97 fL   MCH 25.6 (L) 26.6 - 33.0 pg   MCHC 31.3 (L) 31.5 - 35.7 g/dL   RDW 71.0 62.6 - 94.8 %   Platelets 434 150 - 450 x10E3/uL   Neutrophils 65 Not Estab. %   Lymphs 24 Not Estab. %   Monocytes 7 Not Estab. %   Eos 3 Not Estab. %   Basos 1 Not Estab. %   Neutrophils Absolute 3.8 1.4 - 7.0 x10E3/uL   Lymphocytes Absolute 1.4 0.7 - 3.1 x10E3/uL   Monocytes Absolute 0.4 0.1 - 0.9 x10E3/uL   EOS (ABSOLUTE) 0.2 0.0 - 0.4 x10E3/uL   Basophils Absolute 0.0 0.0 - 0.2 x10E3/uL   Immature Granulocytes 0 Not Estab. %   Immature Grans (Abs) 0.0 0.0 - 0.1 x10E3/uL  Assessment & Plan:   Problem List Items Addressed This Visit       Active Problems   Essential hypertension, benign   Relevant Medications   isosorbide mononitrate (IMDUR) 30 MG 24 hr tablet   Other Relevant Orders   CBC With Differential   CMP14+EGFR   Mixed hyperlipidemia   Relevant Medications   isosorbide mononitrate (IMDUR) 30 MG 24 hr tablet   Other Relevant Orders   Lipid panel   CBC With Differential   CMP14+EGFR   Vitamin D deficiency, unspecified   Relevant Orders   VITAMIN D 25 Hydroxy (Vit-D Deficiency, Fractures)   CBC With Differential   CMP14+EGFR   B12 deficiency due to diet   Relevant Orders   CBC With Differential   CMP14+EGFR   Vitamin B12   Urinary incontinence without sensory awareness    Sending order for patient for urinary incontinence  supplies.  Will also include note.   Reassess at follow up.       Overactive bladder    Sending order for patient for urinary incontinence supplies.  Will also include note.   Reassess at follow up.         Resolved Problems   RESOLVED: Diabetes mellitus (HCC) - Primary   Relevant Orders   POCT CBG (Fasting - Glucose) (Completed)   CBC With Differential   CMP14+EGFR   Hemoglobin A1c   Other Visit Diagnoses     Hypothyroidism (acquired)       Relevant Orders   CBC With Differential   CMP14+EGFR   TSH       Return in about 3 months (around 08/12/2022) for F/U.   Total time spent: 30 minutes  Miki Kins, FNP  05/12/2022

## 2022-05-15 ENCOUNTER — Other Ambulatory Visit: Payer: Self-pay

## 2022-05-15 DIAGNOSIS — M1611 Unilateral primary osteoarthritis, right hip: Secondary | ICD-10-CM

## 2022-05-25 DIAGNOSIS — R32 Unspecified urinary incontinence: Secondary | ICD-10-CM | POA: Diagnosis not present

## 2022-05-29 DIAGNOSIS — M65351 Trigger finger, right little finger: Secondary | ICD-10-CM | POA: Diagnosis not present

## 2022-05-29 DIAGNOSIS — M65331 Trigger finger, right middle finger: Secondary | ICD-10-CM | POA: Diagnosis not present

## 2022-05-29 DIAGNOSIS — M65341 Trigger finger, right ring finger: Secondary | ICD-10-CM | POA: Diagnosis not present

## 2022-06-10 ENCOUNTER — Emergency Department: Payer: Medicare HMO

## 2022-06-10 ENCOUNTER — Inpatient Hospital Stay
Admission: EM | Admit: 2022-06-10 | Discharge: 2022-06-12 | DRG: 683 | Disposition: A | Discharge status: Home Health | Payer: Medicare HMO | Attending: Internal Medicine | Admitting: Internal Medicine

## 2022-06-10 ENCOUNTER — Encounter: Payer: Self-pay | Admitting: Emergency Medicine

## 2022-06-10 ENCOUNTER — Inpatient Hospital Stay: Payer: Medicare HMO

## 2022-06-10 ENCOUNTER — Other Ambulatory Visit: Payer: Self-pay

## 2022-06-10 ENCOUNTER — Inpatient Hospital Stay
Admit: 2022-06-10 | Discharge: 2022-06-10 | Disposition: A | Discharge status: Home / Self Care | Payer: Medicare HMO | Attending: Internal Medicine | Admitting: Internal Medicine

## 2022-06-10 DIAGNOSIS — J811 Chronic pulmonary edema: Secondary | ICD-10-CM | POA: Diagnosis not present

## 2022-06-10 DIAGNOSIS — E1139 Type 2 diabetes mellitus with other diabetic ophthalmic complication: Secondary | ICD-10-CM | POA: Diagnosis present

## 2022-06-10 DIAGNOSIS — E785 Hyperlipidemia, unspecified: Secondary | ICD-10-CM | POA: Diagnosis present

## 2022-06-10 DIAGNOSIS — I10 Essential (primary) hypertension: Secondary | ICD-10-CM | POA: Diagnosis present

## 2022-06-10 DIAGNOSIS — I639 Cerebral infarction, unspecified: Secondary | ICD-10-CM | POA: Diagnosis not present

## 2022-06-10 DIAGNOSIS — R2990 Unspecified symptoms and signs involving the nervous system: Secondary | ICD-10-CM | POA: Diagnosis present

## 2022-06-10 DIAGNOSIS — R7989 Other specified abnormal findings of blood chemistry: Secondary | ICD-10-CM | POA: Diagnosis present

## 2022-06-10 DIAGNOSIS — R079 Chest pain, unspecified: Secondary | ICD-10-CM | POA: Diagnosis not present

## 2022-06-10 DIAGNOSIS — I959 Hypotension, unspecified: Secondary | ICD-10-CM | POA: Diagnosis present

## 2022-06-10 DIAGNOSIS — R8281 Pyuria: Secondary | ICD-10-CM | POA: Insufficient documentation

## 2022-06-10 DIAGNOSIS — N393 Stress incontinence (female) (male): Secondary | ICD-10-CM | POA: Diagnosis present

## 2022-06-10 DIAGNOSIS — R29810 Facial weakness: Secondary | ICD-10-CM | POA: Diagnosis present

## 2022-06-10 DIAGNOSIS — I700 Atherosclerosis of aorta: Secondary | ICD-10-CM | POA: Diagnosis not present

## 2022-06-10 DIAGNOSIS — Z794 Long term (current) use of insulin: Secondary | ICD-10-CM

## 2022-06-10 DIAGNOSIS — K219 Gastro-esophageal reflux disease without esophagitis: Secondary | ICD-10-CM | POA: Diagnosis present

## 2022-06-10 DIAGNOSIS — N390 Urinary tract infection, site not specified: Secondary | ICD-10-CM | POA: Diagnosis not present

## 2022-06-10 DIAGNOSIS — R739 Hyperglycemia, unspecified: Secondary | ICD-10-CM | POA: Diagnosis not present

## 2022-06-10 DIAGNOSIS — Z9049 Acquired absence of other specified parts of digestive tract: Secondary | ICD-10-CM

## 2022-06-10 DIAGNOSIS — R001 Bradycardia, unspecified: Secondary | ICD-10-CM | POA: Diagnosis not present

## 2022-06-10 DIAGNOSIS — E113513 Type 2 diabetes mellitus with proliferative diabetic retinopathy with macular edema, bilateral: Secondary | ICD-10-CM | POA: Diagnosis not present

## 2022-06-10 DIAGNOSIS — Z96651 Presence of right artificial knee joint: Secondary | ICD-10-CM | POA: Diagnosis present

## 2022-06-10 DIAGNOSIS — R269 Unspecified abnormalities of gait and mobility: Secondary | ICD-10-CM | POA: Diagnosis present

## 2022-06-10 DIAGNOSIS — F419 Anxiety disorder, unspecified: Secondary | ICD-10-CM | POA: Diagnosis present

## 2022-06-10 DIAGNOSIS — Z96641 Presence of right artificial hip joint: Secondary | ICD-10-CM | POA: Diagnosis present

## 2022-06-10 DIAGNOSIS — Z8249 Family history of ischemic heart disease and other diseases of the circulatory system: Secondary | ICD-10-CM

## 2022-06-10 DIAGNOSIS — F17210 Nicotine dependence, cigarettes, uncomplicated: Secondary | ICD-10-CM | POA: Diagnosis present

## 2022-06-10 DIAGNOSIS — F32A Depression, unspecified: Secondary | ICD-10-CM | POA: Diagnosis not present

## 2022-06-10 DIAGNOSIS — E1165 Type 2 diabetes mellitus with hyperglycemia: Secondary | ICD-10-CM | POA: Diagnosis present

## 2022-06-10 DIAGNOSIS — Z79899 Other long term (current) drug therapy: Secondary | ICD-10-CM

## 2022-06-10 DIAGNOSIS — M179 Osteoarthritis of knee, unspecified: Secondary | ICD-10-CM | POA: Diagnosis present

## 2022-06-10 DIAGNOSIS — Z7901 Long term (current) use of anticoagulants: Secondary | ICD-10-CM | POA: Diagnosis not present

## 2022-06-10 DIAGNOSIS — W19XXXA Unspecified fall, initial encounter: Secondary | ICD-10-CM | POA: Diagnosis present

## 2022-06-10 DIAGNOSIS — R4701 Aphasia: Secondary | ICD-10-CM | POA: Diagnosis not present

## 2022-06-10 DIAGNOSIS — M1710 Unilateral primary osteoarthritis, unspecified knee: Secondary | ICD-10-CM | POA: Diagnosis present

## 2022-06-10 DIAGNOSIS — K5730 Diverticulosis of large intestine without perforation or abscess without bleeding: Secondary | ICD-10-CM | POA: Diagnosis present

## 2022-06-10 DIAGNOSIS — Z7984 Long term (current) use of oral hypoglycemic drugs: Secondary | ICD-10-CM

## 2022-06-10 DIAGNOSIS — F17200 Nicotine dependence, unspecified, uncomplicated: Secondary | ICD-10-CM | POA: Diagnosis present

## 2022-06-10 DIAGNOSIS — N179 Acute kidney failure, unspecified: Principal | ICD-10-CM | POA: Insufficient documentation

## 2022-06-10 DIAGNOSIS — J4489 Other specified chronic obstructive pulmonary disease: Secondary | ICD-10-CM | POA: Diagnosis not present

## 2022-06-10 DIAGNOSIS — I4892 Unspecified atrial flutter: Secondary | ICD-10-CM | POA: Diagnosis not present

## 2022-06-10 DIAGNOSIS — I2510 Atherosclerotic heart disease of native coronary artery without angina pectoris: Secondary | ICD-10-CM | POA: Diagnosis present

## 2022-06-10 DIAGNOSIS — R55 Syncope and collapse: Secondary | ICD-10-CM | POA: Diagnosis present

## 2022-06-10 DIAGNOSIS — R0902 Hypoxemia: Secondary | ICD-10-CM | POA: Diagnosis not present

## 2022-06-10 DIAGNOSIS — Z961 Presence of intraocular lens: Secondary | ICD-10-CM | POA: Diagnosis present

## 2022-06-10 DIAGNOSIS — Z720 Tobacco use: Secondary | ICD-10-CM | POA: Diagnosis present

## 2022-06-10 DIAGNOSIS — Z90710 Acquired absence of both cervix and uterus: Secondary | ICD-10-CM

## 2022-06-10 DIAGNOSIS — I6389 Other cerebral infarction: Secondary | ICD-10-CM | POA: Diagnosis not present

## 2022-06-10 HISTORY — DX: Acute kidney failure, unspecified: N17.9

## 2022-06-10 LAB — URINALYSIS, W/ REFLEX TO CULTURE (INFECTION SUSPECTED)
Bacteria, UA: NONE SEEN
Bilirubin Urine: NEGATIVE
Glucose, UA: NEGATIVE mg/dL
Hgb urine dipstick: NEGATIVE
Ketones, ur: NEGATIVE mg/dL
Nitrite: NEGATIVE
Protein, ur: NEGATIVE mg/dL
Specific Gravity, Urine: 1.018 (ref 1.005–1.030)
pH: 5 (ref 5.0–8.0)

## 2022-06-10 LAB — COMPREHENSIVE METABOLIC PANEL
ALT: 11 U/L (ref 0–44)
AST: 21 U/L (ref 15–41)
Albumin: 3.3 g/dL — ABNORMAL LOW (ref 3.5–5.0)
Alkaline Phosphatase: 73 U/L (ref 38–126)
Anion gap: 10 (ref 5–15)
BUN: 36 mg/dL — ABNORMAL HIGH (ref 8–23)
CO2: 26 mmol/L (ref 22–32)
Calcium: 8.4 mg/dL — ABNORMAL LOW (ref 8.9–10.3)
Chloride: 100 mmol/L (ref 98–111)
Creatinine, Ser: 1.65 mg/dL — ABNORMAL HIGH (ref 0.44–1.00)
GFR, Estimated: 31 mL/min — ABNORMAL LOW (ref >60)
Glucose, Bld: 202 mg/dL — ABNORMAL HIGH (ref 70–99)
Potassium: 3.8 mmol/L (ref 3.5–5.1)
Sodium: 136 mmol/L (ref 135–145)
Total Bilirubin: 0.6 mg/dL (ref 0.3–1.2)
Total Protein: 6.2 g/dL — ABNORMAL LOW (ref 6.5–8.1)

## 2022-06-10 LAB — LACTIC ACID, PLASMA
Lactic Acid, Venous: 2.6 mmol/L (ref 0.5–1.9)
Lactic Acid, Venous: 2.2 mmol/L (ref 0.5–1.9)

## 2022-06-10 LAB — GLUCOSE, CAPILLARY
Glucose-Capillary: 144 mg/dL — ABNORMAL HIGH (ref 70–99)
Glucose-Capillary: 136 mg/dL — ABNORMAL HIGH (ref 70–99)

## 2022-06-10 LAB — CBC WITH DIFFERENTIAL/PLATELET
Abs Immature Granulocytes: 0.02 10*3/uL (ref 0.00–0.07)
Basophils Absolute: 0 10*3/uL (ref 0.0–0.1)
Basophils Relative: 1 %
Eosinophils Absolute: 0.1 10*3/uL (ref 0.0–0.5)
Eosinophils Relative: 1 %
HCT: 31.4 % — ABNORMAL LOW (ref 36.0–46.0)
Hemoglobin: 9.9 g/dL — ABNORMAL LOW (ref 12.0–15.0)
Immature Granulocytes: 0 %
Lymphocytes Relative: 16 %
Lymphs Abs: 1 10*3/uL (ref 0.7–4.0)
MCH: 27.4 pg (ref 26.0–34.0)
MCHC: 31.5 g/dL (ref 30.0–36.0)
MCV: 87 fL (ref 80.0–100.0)
Monocytes Absolute: 0.4 10*3/uL (ref 0.1–1.0)
Monocytes Relative: 5 %
Neutro Abs: 5 10*3/uL (ref 1.7–7.7)
Neutrophils Relative %: 77 %
Platelets: 338 10*3/uL (ref 150–400)
RBC: 3.61 MIL/uL — ABNORMAL LOW (ref 3.87–5.11)
RDW: 14 % (ref 11.5–15.5)
WBC: 6.5 10*3/uL (ref 4.0–10.5)
nRBC: 0 % (ref 0.0–0.2)

## 2022-06-10 LAB — APTT: aPTT: 34 seconds (ref 24–36)

## 2022-06-10 LAB — TROPONIN I (HIGH SENSITIVITY)
Troponin I (High Sensitivity): 5 ng/L (ref <18)
Troponin I (High Sensitivity): 6 ng/L (ref <18)

## 2022-06-10 LAB — CBG MONITORING, ED: Glucose-Capillary: 176 mg/dL — ABNORMAL HIGH (ref 70–99)

## 2022-06-10 LAB — ECHOCARDIOGRAM COMPLETE

## 2022-06-10 LAB — PROTIME-INR
INR: 1.4 — ABNORMAL HIGH (ref 0.8–1.2)
Prothrombin Time: 16.9 seconds — ABNORMAL HIGH (ref 11.4–15.2)

## 2022-06-10 LAB — PROCALCITONIN: Procalcitonin: <0.1 ng/mL

## 2022-06-10 MED ORDER — FERROUS SULFATE 325 (65 FE) MG PO TABS
325.0000 mg | ORAL_TABLET | Freq: Every day | ORAL | Status: DC
  Administered 2022-06-11 – 2022-06-12 (×2): 325 mg via ORAL
  Filled 2022-06-10 (×2): qty 1

## 2022-06-10 MED ORDER — HYDROXYZINE HCL 50 MG PO TABS: 25.0000 mg | ORAL_TABLET | Freq: Every evening | ORAL | Status: DC | PRN

## 2022-06-10 MED ORDER — SODIUM CHLORIDE 0.9 % IV BOLUS
500.0000 mL | Freq: Once | INTRAVENOUS | Status: AC
  Administered 2022-06-10: 500 mL via INTRAVENOUS

## 2022-06-10 MED ORDER — NOREPINEPHRINE 4 MG/250ML-% IV SOLN: 2.0000 ug/min | INTRAVENOUS | Status: DC

## 2022-06-10 MED ORDER — METOPROLOL SUCCINATE ER 50 MG PO TB24
50.0000 mg | ORAL_TABLET | Freq: Every day | ORAL | Status: DC
  Administered 2022-06-11: 50 mg via ORAL
  Filled 2022-06-10: qty 1

## 2022-06-10 MED ORDER — NICOTINE 14 MG/24HR TD PT24: 14.0000 mg | MEDICATED_PATCH | Freq: Every day | TRANSDERMAL | Status: DC | PRN

## 2022-06-10 MED ORDER — INSULIN ASPART 100 UNIT/ML IJ SOLN: 0.0000 [IU] | Freq: Every day | INTRAMUSCULAR | Status: DC

## 2022-06-10 MED ORDER — INSULIN GLARGINE-YFGN 100 UNIT/ML ~~LOC~~ SOLN
30.0000 [IU] | Freq: Every day | SUBCUTANEOUS | Status: DC
  Administered 2022-06-11: 30 [IU] via SUBCUTANEOUS
  Filled 2022-06-10: qty 0.3

## 2022-06-10 MED ORDER — HEPARIN SODIUM (PORCINE) 5000 UNIT/ML IJ SOLN: 5000.0000 [IU] | Freq: Three times a day (TID) | INTRAMUSCULAR | Status: DC

## 2022-06-10 MED ORDER — ACETAMINOPHEN 325 MG PO TABS
650.0000 mg | ORAL_TABLET | ORAL | Status: DC | PRN
  Administered 2022-06-12: 650 mg via ORAL
  Filled 2022-06-10: qty 2

## 2022-06-10 MED ORDER — SODIUM CHLORIDE 0.9 % IV BOLUS (SEPSIS)
1000.0000 mL | Freq: Once | INTRAVENOUS | Status: AC
  Administered 2022-06-10: 1000 mL via INTRAVENOUS

## 2022-06-10 MED ORDER — SODIUM CHLORIDE 0.9 % IV SOLN
2.0000 g | INTRAVENOUS | Status: DC
  Administered 2022-06-10: 2 g via INTRAVENOUS
  Filled 2022-06-10: qty 20

## 2022-06-10 MED ORDER — ASPIRIN 81 MG PO CHEW
324.0000 mg | CHEWABLE_TABLET | Freq: Once | ORAL | Status: AC
  Administered 2022-06-10: 324 mg via ORAL
  Filled 2022-06-10: qty 4

## 2022-06-10 MED ORDER — ALBUTEROL SULFATE (2.5 MG/3ML) 0.083% IN NEBU: 3.0000 mL | INHALATION_SOLUTION | Freq: Four times a day (QID) | RESPIRATORY_TRACT | Status: DC | PRN

## 2022-06-10 MED ORDER — IOHEXOL 350 MG/ML SOLN
80.0000 mL | Freq: Once | INTRAVENOUS | Status: AC | PRN
  Administered 2022-06-10: 80 mL via INTRAVENOUS

## 2022-06-10 MED ORDER — FLUTICASONE PROPIONATE 50 MCG/ACT NA SUSP: 2.0000 | Freq: Every day | NASAL | Status: DC | PRN

## 2022-06-10 MED ORDER — APIXABAN 5 MG PO TABS: 5.0000 mg | ORAL_TABLET | Freq: Two times a day (BID) | ORAL | Status: DC

## 2022-06-10 MED ORDER — HYDRALAZINE HCL 20 MG/ML IJ SOLN: 5.0000 mg | Freq: Three times a day (TID) | INTRAMUSCULAR | Status: AC | PRN

## 2022-06-10 MED ORDER — PIPERACILLIN-TAZOBACTAM 3.375 G IVPB 30 MIN
3.3750 g | Freq: Once | INTRAVENOUS | Status: AC
  Administered 2022-06-10: 3.375 g via INTRAVENOUS
  Filled 2022-06-10: qty 50

## 2022-06-10 MED ORDER — SODIUM CHLORIDE 0.9 % IV SOLN
250.0000 mL | INTRAVENOUS | Status: DC
  Administered 2022-06-10: 250 mL via INTRAVENOUS

## 2022-06-10 MED ORDER — PRAVASTATIN SODIUM 20 MG PO TABS
40.0000 mg | ORAL_TABLET | Freq: Every day | ORAL | Status: DC
  Administered 2022-06-10 – 2022-06-11 (×2): 40 mg via ORAL
  Filled 2022-06-10 (×2): qty 2

## 2022-06-10 MED ORDER — SENNOSIDES-DOCUSATE SODIUM 8.6-50 MG PO TABS
1.0000 | ORAL_TABLET | Freq: Two times a day (BID) | ORAL | Status: AC
  Administered 2022-06-10 – 2022-06-11 (×2): 1 via ORAL
  Filled 2022-06-10 (×2): qty 1

## 2022-06-10 MED ORDER — STROKE: EARLY STAGES OF RECOVERY BOOK: Freq: Once | Status: AC

## 2022-06-10 MED ORDER — APIXABAN 2.5 MG PO TABS
2.5000 mg | ORAL_TABLET | Freq: Two times a day (BID) | ORAL | Status: AC
  Administered 2022-06-10 – 2022-06-11 (×2): 2.5 mg via ORAL
  Filled 2022-06-10 (×2): qty 1

## 2022-06-10 MED ORDER — NOREPINEPHRINE 4 MG/250ML-% IV SOLN
INTRAVENOUS | Status: AC
  Administered 2022-06-10: 4 ug/min via INTRAVENOUS
  Filled 2022-06-10: qty 250

## 2022-06-10 MED ORDER — ACETAMINOPHEN 160 MG/5ML PO SOLN: 650.0000 mg | ORAL | Status: DC | PRN

## 2022-06-10 MED ORDER — ACETAMINOPHEN 650 MG RE SUPP: 650.0000 mg | RECTAL | Status: DC | PRN

## 2022-06-10 MED ORDER — INSULIN ASPART 100 UNIT/ML IJ SOLN
0.0000 [IU] | Freq: Three times a day (TID) | INTRAMUSCULAR | Status: DC
  Administered 2022-06-10: 1 [IU] via SUBCUTANEOUS
  Administered 2022-06-11: 3 [IU] via SUBCUTANEOUS
  Administered 2022-06-11: 1 [IU] via SUBCUTANEOUS
  Administered 2022-06-11: 3 [IU] via SUBCUTANEOUS
  Filled 2022-06-10 (×3): qty 1

## 2022-06-10 MED ORDER — SENNOSIDES-DOCUSATE SODIUM 8.6-50 MG PO TABS: 1.0000 | ORAL_TABLET | Freq: Every evening | ORAL | Status: DC | PRN

## 2022-06-10 MED ORDER — ISOSORBIDE MONONITRATE ER 30 MG PO TB24: 30.0000 mg | ORAL_TABLET | Freq: Every day | ORAL | Status: DC

## 2022-06-10 MED ORDER — DULOXETINE HCL 30 MG PO CPEP
60.0000 mg | ORAL_CAPSULE | Freq: Every day | ORAL | Status: DC
  Administered 2022-06-10 – 2022-06-11 (×2): 60 mg via ORAL
  Filled 2022-06-10 (×2): qty 2

## 2022-06-10 MED ORDER — SODIUM CHLORIDE 0.9 % IV SOLN: INTRAVENOUS | Status: DC

## 2022-06-10 NOTE — Assessment & Plan Note (Signed)
Patient does not meet SIRS/sepsis criteria however does have organ dysfunction including pulmonary with hypoxic respiratory failure, SpO2 of 89% on room air, altered mental status, and acute kidney injury Source of infection is urinary and possible pneumonia Blood cultures x 2 have been ordered and pending collection, check procalcitonin If procalcitonin is elevated, I will initiate azithromycin for atypical coverage Urine showed large leukocytes, ceftriaxone will be started Admit to telemetry medical, inpatient

## 2022-06-10 NOTE — ED Notes (Signed)
Advised nurse that patient has ready bed 

## 2022-06-10 NOTE — Assessment & Plan Note (Signed)
With altered mental status and increased lactic acid, sepsis cannot be ruled out at this time Blood cultures x 2 ordered Patient is status post 1 dose of Zosyn We will continue with ceftriaxone 2 g IV daily

## 2022-06-10 NOTE — Consult Note (Signed)
Neurology Consultation Reason for Consult: Slurred speech Referring Physician: Cox, a  CC: Slurred speech  History is obtained from: Patient  HPI: Wanda Gardner is a 82 y.o. female with a history of diabetes, hypertension, hyperlipidemia who presents with possible strokelike symptoms.  She was found down at approximately 930, she was slurred speech and was some concern for right-sided weakness, and she was found to have severely low blood pressure.  She was brought to the emergency department and she was initially started on Levophed and given IV fluids, she was started on antibiotics.  She states that she is feeling somewhat better now, but continued to slur her speech.  She denies any history of stroke.   LKW: 2 AM tnk given?: no, outside of window  Past Medical History:  Diagnosis Date   Acid reflux    Arthritis    Asthma    Chest pain, unspecified 10/30/2013   Formatting of this note might be different from the original. Note: Unchanged Note: Unchanged Formatting of this note might be different from the original. Note: Unchanged Note: Unchanged   Constipation 04/20/2011   Formatting of this note might be different from the original. Note: Unchanged - probably CIC. Note: Unchanged - probably [facility].   COPD (chronic obstructive pulmonary disease)    Cough 10/30/2013   Formatting of this note might be different from the original. Note: Unchanged Note: Unchanged   Depression    Diabetes mellitus 01/10/2022   Diabetes mellitus without complication    Dyspnea    Glaucoma    Headache    right side of head   Hyperlipidemia    Hypertension    Knee stiff 05/12/2022   Neuropathic pain of hands and feet    PONV (postoperative nausea and vomiting) 1982   back with my hysterectomy.   Pseudophakia 03/22/2010   Sleep apnea    Doesn't use CPAP   Stiffness of shoulder joint 05/12/2022   Trigger finger of right hand 01/10/2022   Wears dentures    full upper and lower     Family  History  Problem Relation Age of Onset   Cancer Father    Heart disease Mother      Social History:  reports that she has been smoking cigarettes. She has a 25.00 pack-year smoking history. She has never used smokeless tobacco. She reports current alcohol use. She reports current drug use. Drug: Marijuana.   Exam: Current vital signs: BP (!) 146/55 (BP Location: Left Arm)   Pulse 61   Temp 97.8 F (36.6 C)   Resp 16   Wt 92.8 kg   SpO2 99%   BMI 32.03 kg/m  Vital signs in last 24 hours: Temp:  [96.1 F (35.6 C)-97.8 F (36.6 C)] 97.8 F (36.6 C) (04/20 1530) Pulse Rate:  [50-71] 61 (04/20 1530) Resp:  [11-25] 16 (04/20 1530) BP: (62-188)/(35-75) 146/55 (04/20 1530) SpO2:  [89 %-99 %] 99 % (04/20 1530) Weight:  [92.8 kg] 92.8 kg (04/20 1032)   Physical Exam  Appears well-developed and well-nourished.   Neuro: Mental Status: Patient is awake, alert, oriented to person, place, month, year, and situation. Patient is able to give a clear and coherent history. No signs of aphasia or neglect Cranial Nerves: II: Visual Fields are full. Pupils are equal, round, and reactive to light.   III,IV, VI: EOMI without ptosis or diploplia.  V: Facial sensation is symmetric to temperature VII: Facial movement with left facial weakness VIII: hearing is intact to voice  X: Uvula elevates symmetrically XI: Shoulder shrug is symmetric. XII: tongue is midline without atrophy or fasciculations.  Motor: Tone is normal. Bulk is normal. 5/5 strength was present in all four extremities.  Sensory: Sensation is symmetric to light touch and temperature in the arms and legs. Cerebellar: She has slow finger-nose-finger bilaterally, but no definite ataxia.   I have reviewed labs in epic and the results pertinent to this consultation are: Creatinine 1.65  I have reviewed the images obtained: CT/CTA-occluded left PICA  Impression: 82 year old female with presentation most consistent with  sepsis, with facial droop.  When looking at her epic picture, it does appear to me there is an asymmetry in the nasolabial fold even at that time, and it may be that she has some underlying left facial weakness that is now recrudescence in the setting of acute stressor.  It is also possible that she does have new acute stroke, especially in the setting of an occluded PICA.  An MRI would be helpful in determining this.  Recommendations: 1) MRI brain 2) continue apixaban, if there is a large territory stroke, may need to hold apixaban temporarily. 3) if stroke is demonstrated on the MRI: echo, telemetry, lipids, A1c, PT, OT, ST 4) if MRI is negative, then no further recommendations at this time and would continue Eliquis.   Ritta Slot, MD [facility] [phone]  If 7pm- 7am, please page neurology on call as listed in AMION.

## 2022-06-10 NOTE — Assessment & Plan Note (Addendum)
Patient is on Eliquis 5 mg p.o. twice daily.  Eliquis has been reduced to 2.5 mg p.o., 2 doses due to acute kidney injury AM team to resume full dosing Eliquis when the benefits outweigh the risk

## 2022-06-10 NOTE — Assessment & Plan Note (Addendum)
Multifactorial, etiology workup in progress Differentials include prerenal versus ATN in setting of possible SIRS/sepsis as patient was initially hypotensive requiring Levophed with source of infection and elevated lactic acid Patient is status post sodium chloride 1 L bolus x 2, will order additional NS 500 mL bolus on admission.  Continue sodium chloride 150 mL/h, 1 day ordered Repeat BMP in a.m. Telemetry medical, inpatient

## 2022-06-10 NOTE — Assessment & Plan Note (Addendum)
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 pending swallow study, PT, OT, SLP, Fall precaution

## 2022-06-10 NOTE — ED Provider Notes (Signed)
[facility] Provider Note    Event Date/Time   First MD Initiated Contact with Patient 06/10/22 1029     (approximate)   History   Hypotension and Aphasia   HPI  Wanda Gardner is a 82 y.o. female past medical history significant for Eliquis use for unknown reason, presents to the emergency department with strokelike symptoms.  According to EMS patient's last known well was at 2:00 this morning.  States that family members called EMS with concerns for possible stroke.  According to family members this morning she was found at approximately 930 on the floor.  Unwitnessed fall.  Patient's family members noted that she was having slurred speech and right-sided weakness with a right-sided facial droop.  When EMS arrived patient had a low blood pressure.  No history of stroke.  Patient complaining of abdominal pain, slurred speech.  Complaining of feeling very weak and work worsening weakness to the right side.  Patient states that she does not ambulate at baseline due to weakness.  States that she has a bedside commode.  States that she does not cook for herself and that sometimes family members will bring her food.  States that she lives by herself at home.  Multiple attempts to call daughter and nephew for further information with no answer.      Physical Exam   Triage Vital Signs: ED Triage Vitals [06/10/22 1026]  Enc Vitals Group     BP (!) 78/42     Pulse      Resp      Temp      Temp src      SpO2      Weight      Height      Head Circumference      Peak Flow      Pain Score      Pain Loc      Pain Edu?      Excl. in GC?     Most recent vital signs: Vitals:   06/10/22 1120 06/10/22 1145  BP: 130/71   Pulse: 61 68  Resp: 12 14  Temp:    SpO2: 98% 94%    Physical Exam Constitutional:      Appearance: She is well-developed. She is obese. She is ill-appearing.  HENT:     Head: Atraumatic.  Eyes:     Conjunctiva/sclera: Conjunctivae  normal.  Cardiovascular:     Rate and Rhythm: Regular rhythm.  Pulmonary:     Effort: No respiratory distress.  Abdominal:     General: There is no distension.  Musculoskeletal:        General: Normal range of motion.     Cervical back: Normal range of motion.     Right lower leg: No edema.     Left lower leg: No edema.  Skin:    General: Skin is warm.     Capillary Refill: Capillary refill takes less than 2 seconds.  Neurological:     Mental Status: She is alert. Mental status is at baseline.     GCS: GCS eye subscore is 4. GCS verbal subscore is 5. GCS motor subscore is 6.     Cranial Nerves: Facial asymmetry present.     Sensory: Sensory deficit (Decree sensation to the right face, arm and leg) present.     Motor: Weakness (Pronator drift to the right upper extremity.  4+5/5 to right upper extremity.  Unable to hold bilateral lower extremities up to  gravity.) present.     Comments: Deferred gait given that the patient does not ambulate at baseline.  Significant cataracts, unable to further test for visual field deficits.     IMPRESSION / MDM / ASSESSMENT AND PLAN / ED COURSE  I reviewed the triage vital signs and the nursing notes.  On arrival to the emergency department patient significantly hypotensive with a blood pressure of 62/52, repeat 78/42.  Patient is bradycardic and not tachycardic.  Hypothermic.  Did not activate a code stroke  - patient is outside of the window for TNK given her last known well at 2 AM and is on anticoagulation.  Based on her current NIH score and significant hypotension do not feel that the patient would be a good candidate for thrombectomy.  Low suspicion for LVO. Based on current Neuro exam and RACE score.   Will obtain CT angiography.  Initially taken back for CT scan of the head.  CT scan of the head shows no signs of intracranial hemorrhage or infarct.    Differential diagnosis including sepsis, CVA, intracranial hemorrhage, dissection    Patient with significant hypotension.  Concern for possible sepsis.  Difficult IV stick.  Delay in blood cultures/abx.  Started on IV fluids and peripheral Levophed.  Able to down titrate from Levophed and continue on IV fluids.   EKG  I, Corena Herter, the attending physician, personally viewed and interpreted this ECG.   Rate: Bradycardia, 57  Rhythm: Bradycardia  Axis: Normal  Intervals: Normal  ST&T Change: Nonspecific ST changes.    No tachycardic or bradycardic dysrhythmias while on cardiac telemetry.  RADIOLOGY I independently reviewed imaging, my interpretation of imaging: CT scan of head shows no signs of intracranial hemorrhage  LABS (all labs ordered are listed, but only abnormal results are displayed) Labs interpreted as -    Labs Reviewed  LACTIC ACID, PLASMA - Abnormal; Notable for the following components:      Result Value   Lactic Acid, Venous 2.6 (*)    All other components within normal limits  COMPREHENSIVE METABOLIC PANEL - Abnormal; Notable for the following components:   Glucose, Bld 202 (*)    BUN 36 (*)    Creatinine, Ser 1.65 (*)    Calcium 8.4 (*)    Total Protein 6.2 (*)    Albumin 3.3 (*)    GFR, Estimated 31 (*)    All other components within normal limits  CBC WITH DIFFERENTIAL/PLATELET - Abnormal; Notable for the following components:   RBC 3.61 (*)    Hemoglobin 9.9 (*)    HCT 31.4 (*)    All other components within normal limits  PROTIME-INR - Abnormal; Notable for the following components:   Prothrombin Time 16.9 (*)    INR 1.4 (*)    All other components within normal limits  URINALYSIS, W/ REFLEX TO CULTURE (INFECTION SUSPECTED) - Abnormal; Notable for the following components:   Color, Urine YELLOW (*)    APPearance HAZY (*)    Leukocytes,Ua LARGE (*)    All other components within normal limits  CBG MONITORING, ED - Abnormal; Notable for the following components:   Glucose-Capillary 176 (*)    All other components within  normal limits  CULTURE, BLOOD (ROUTINE X 2)  CULTURE, BLOOD (ROUTINE X 2)  URINE CULTURE  APTT  LACTIC ACID, PLASMA  URINE DRUG SCREEN, QUALITATIVE ([facility] ONLY)  TROPONIN I (HIGH SENSITIVITY)  TROPONIN I (HIGH SENSITIVITY)     MDM   Clinical Course as  of 06/10/22 1320  Sat Jun 10, 2022  1200 Attempted to call daughter multiple times, go straight to voicemail [SM]    Clinical Course User Index [SM] Corena Herter, MD   Initial lactic acid elevated.  No significant leukocytosis.  Felt that 30 cc/kg of IV fluids may be detrimental to the patient, given improvement of her blood pressure following 1 L of IV fluids/levophed, given 1 L of IV fluids and will reevaluate.  Blood pressure improved and able to take off of peripheral Levophed.  Given a second liter of IV fluids and started on maintenance fluid.  Repeat blood pressure improved to 130/71.  Given IV Zosyn to cover for possible intra-abdominal infectious process.  Delay in antibiotics and blood cultures secondary to difficult IV stick.  Given her abdominal pain and strokelike symptoms, reports of chest pain yesterday will obtain CTA dissection protocol.  Discussed with radiology CT tech for possible bolus timing of CTA head and neck and chest abdomen pelvis.  Will attempt to do 1 contrast load.  CT scan without signs of dissection.  No LVO.  No obvious intra-abdominal pathology no signs of pneumonia.  On reevaluation continues to have slurred speech and decree sensation to the right hemibody.  Patient's daughter at bedside states that this is not her baseline.  Concern for CVA.  Given aspirin.  Consulted hospitalist for admission for CVA and hypotension.  PROCEDURES:  Critical Care performed: yes  .Critical Care  Performed by: Corena Herter, MD Authorized by: Corena Herter, MD   Critical care provider statement:    Critical care time (minutes):  45   Critical care time was exclusive of:  Separately billable procedures and  treating other patients   Critical care was necessary to treat or prevent imminent or life-threatening deterioration of the following conditions:  CNS failure or compromise and shock   Critical care was time spent personally by me on the following activities:  Development of treatment plan with patient or surrogate, discussions with consultants, evaluation of patient's response to treatment, examination of patient, ordering and review of laboratory studies, ordering and review of radiographic studies, ordering and performing treatments and interventions, pulse oximetry, re-evaluation of patient's condition and review of old charts   Patient's presentation is most consistent with acute presentation with potential threat to life or bodily function.   MEDICATIONS ORDERED IN ED: Medications  0.9 %  sodium chloride infusion (250 mLs Intravenous New Bag/Given 06/10/22 1122)  norepinephrine (LEVOPHED) 4mg  in (0.016 mg/mL) premix infusion (0 mcg/min Intravenous Stopped 06/10/22 1112)  piperacillin-tazobactam (ZOSYN) IVPB 3.375 g (has no administration in time range)  pravastatin (PRAVACHOL) tablet 40 mg (has no administration in time range)   stroke: early stages of recovery book (has no administration in time range)  acetaminophen (TYLENOL) tablet 650 mg (has no administration in time range)    Or  acetaminophen (TYLENOL) 160 MG/5ML solution 650 mg (has no administration in time range)    Or  acetaminophen (TYLENOL) suppository 650 mg (has no administration in time range)  senna-docusate (Senokot-S) tablet 1 tablet (has no administration in time range)  heparin injection 5,000 Units (has no administration in time range)  sodium chloride 0.9 % bolus 1,000 mL (0 mLs Intravenous Stopped 06/10/22 1110)  iohexol (OMNIPAQUE) 350 MG/ML injection 80 mL (80 mLs Intravenous Contrast Given 06/10/22 1144)  sodium chloride 0.9 % bolus 1,000 mL (1,000 mLs Intravenous New Bag/Given 06/10/22 1240)  aspirin  chewable tablet 324 mg (324 mg Oral Given 06/10/22 1247)  FINAL CLINICAL IMPRESSION(S) / ED DIAGNOSES   Final diagnoses:  Hypotension, unspecified hypotension type  Stroke-like symptoms     Rx / DC Orders   ED Discharge Orders     None        Note:  This document was prepared using Dragon voice recognition software and may include unintentional dictation errors.   Corena Herter, MD 06/10/22 1320

## 2022-06-10 NOTE — Assessment & Plan Note (Signed)
-   As needed nicotine patch ordered for nicotine craving 

## 2022-06-10 NOTE — H&P (Signed)
History and Physical   Wanda Gardner ZOX:[id number] DOB: 03/25/1940 DOA: 06/10/2022  PCP: Miki Kins, FNP  Outpatient Specialists: [facility] heart vascular Patient coming from: Home via EMS  I have personally briefly reviewed patient's old medical records in [facility] EMR.  Chief Concern: Slurred speech  HPI: Wanda Gardner is an 82 year old female with history of insulin-dependent diabetes mellitus, depression, anxiety, GERD, hypertension, hyperlipidemia, atrial flutter on Eliquis, descending diverticulosis, coronary artery disease, primary hypertension, who presents emergency department for chief concerns of slurred speech.  Last known normal was 2 AM on 06/10/2022.  Vitals showed temperature of 96.1, respiration rate of 13, heart rate 50, blood pressure 136/59, patient had initial SBP in the 60s,, was translate started on Levophed.  At the time of hospitalist consultation, patient has been off Levophed for over 1 hour.  Serum sodium is 136, potassium 3.8, chloride 100, bicarb 26, BUN of 36, serum creatinine 1.65, EGFR 31, nonfasting blood glucose 202, WBC 6.5, hemoglobin 9.9, platelets of 338.  Lactic acid was initially 2.6 and on repeat was 2.2.  High sensitive troponin was initially 5 and on repeat was 6.  UA was positive for large leukocytes.  CT of the head without contrast: read as no acute intracranial abnormality or acute traumatic injury.  CTA head and neck with and without contrast: Left PICA is not visualized.  Age-indeterminate.  Otherwise no intracranial large vessel occlusion.  Moderate narrowing in the cavernous segment of the right ICA.  No hemodynamically significant stenosis in the neck.  CTA chest abdomen pelvis for dissection: Negative for acute PE or thoracic aortic dissection.  Coronary and aortic atherosclerosis.  Descending sigmoid diverticulosis.    ED treatment: Aspirin 324 mg p.o. one-time dose, ondansetron 4 mg IV one-time dose, sodium chloride 1 L  bolus x 2, and Levophed gtt. from approximately 10:30 AM until noon. ------------------------ At bedside, patient patient was able to tell me her name, age, location, current calendar year.  She reports that she does not know why she was laying on the floor at home.  She denies chest pain, shortness of breath, abdominal pain, dysuria, hematuria, diarrhea.  She denies trauma to her person.  She reports that she has generalized weakness not just right upper extremity weakness.  Her daughter, Pedro Earls at bedside reports that her slurring of speech has improved significantly.  On physical exam, patient did endorse suprapubic tenderness.  Social history: She lives at home.  Patient is retired.  ROS: Constitutional: no weight change, no fever ENT/Mouth: no sore throat, no rhinorrhea Eyes: no eye pain, no vision changes Cardiovascular: no chest pain, no dyspnea,  no edema, no palpitations Respiratory: no cough, no sputum, no wheezing Gastrointestinal: no nausea, no vomiting, no diarrhea, no constipation Genitourinary: no urinary incontinence, no dysuria, no hematuria Musculoskeletal: no arthralgias, no myalgias Skin: no skin lesions, no pruritus, Neuro: + weakness, no loss of consciousness, no syncope Psych: no anxiety, no depression, + decrease appetite Heme/Lymph: no bruising, no bleeding  ED Course: Discussed with emergency medicine provider, patient requiring hospitalization for chief concerns of slurred speech.  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 pending swallow study, PT, OT, SLP, Fall precaution   Elevated lactic acid level Patient does not meet SIRS/sepsis criteria however does have organ dysfunction including pulmonary with hypoxic respiratory failure, SpO2 of 89% on room air, altered mental status, and acute kidney injury Source of infection is urinary and possible pneumonia Blood cultures x 2 have been ordered and pending collection, check procalcitonin If procalcitonin is elevated, I will initiate azithromycin for atypical coverage Urine showed large leukocytes, ceftriaxone will be started Admit to telemetry medical, inpatient  Pyuria With altered mental status and increased lactic acid, sepsis cannot be ruled out at this time Blood cultures x 2 ordered Patient is status post 1 dose of Zosyn We will continue with ceftriaxone 2 g IV daily  AKI (acute kidney injury) Multifactorial, etiology workup in progress Differentials include prerenal versus ATN in setting of possible SIRS/sepsis as patient was initially hypotensive requiring Levophed with source of infection and elevated lactic acid Patient is status post sodium chloride 1 L bolus x 2, will order additional NS 500 mL bolus on admission.  Continue sodium chloride 150 mL/h, 1 day ordered Repeat BMP in a.m. Telemetry medical, inpatient  Tobacco use disorder As needed nicotine patch ordered for nicotine craving  Type 2 diabetes mellitus with hyperglycemia Patient takes long-acting insulin glargine 30 units nightly however per med reconciliation, patient has not  taken her long-acting insulin for over a week and blood glucose is within normal limits Long-acting insulin 30 units has been resumed for 06/10/2020 nightly Insulin SSI with agents coverage ordered, renal dosing Goal inpatient blood glucose levels 140-180  Atrial flutter Patient is on Eliquis 5 mg p.o. twice daily.  Eliquis has been reduced to 2.5 mg p.o., 2 doses due to acute kidney injury AM team to resume full dosing Eliquis when the benefits outweigh the risk  Chart reviewed.   DVT prophylaxis: Eliquis 5 mg p.o. twice daily Code Status: Full code Diet: Heart healthy/carb modified Family Communication: Updated daughter, and nephew Cory Munch at bedside with patient's permission Disposition Plan: pendicing clinical course Consults called: Neurology Admission status: Telemetry medical, inpatient  Past Medical History:  Diagnosis Date   Acid reflux    Arthritis    Asthma    Chest pain, unspecified 10/30/2013   Formatting of this note might be different from the original. Note: Unchanged Note: Unchanged Formatting of this note might be different from the original. Note: Unchanged Note: Unchanged   Constipation 04/20/2011   Formatting of this note might be different from the original. Note: Unchanged - probably [facility]. Note: Unchanged - probably [facility].   COPD (chronic obstructive pulmonary disease)    Cough 10/30/2013   Formatting of this note might be different from the original. Note: Unchanged Note: Unchanged   Depression    Diabetes mellitus 01/10/2022   Diabetes mellitus without complication    Dyspnea    Glaucoma    Headache    right side of head   Hyperlipidemia    Hypertension    Knee stiff 05/12/2022   Neuropathic pain of hands  and feet    PONV (postoperative nausea and vomiting) 1982   back with my hysterectomy.   Pseudophakia 03/22/2010   Sleep apnea    Doesn't use CPAP   Stiffness of shoulder joint 05/12/2022   Trigger finger of right hand 01/10/2022   Wears dentures     full upper and lower   Past Surgical History:  Procedure Laterality Date   ABDOMINAL HYSTERECTOMY     CATARACT EXTRACTION W/ INTRAOCULAR LENS  IMPLANT, BILATERAL     CATARACT EXTRACTION W/PHACO Left 12/14/2021   Procedure: CATARACT EXTRACTION PHACO AND INTRAOCULAR LENS PLACEMENT (IOC) LEFT DIABETIC 24.79 02:23.9;  Surgeon: Lockie Mola, MD;  Location: [facility];  Service: Ophthalmology;  Laterality: Left;  Diabetic   CHOLECYSTECTOMY     COLONOSCOPY WITH PROPOFOL N/A 10/29/2014   Procedure: COLONOSCOPY WITH PROPOFOL;  Surgeon: Wallace Cullens, MD;  Location[facility] END[facility]COPY;  Service: Gastroenterology;  Laterality: N/A;   ESOPHAGOGASTRODUODENOSCOPY (EGD) WITH PROPOFOL N/A 10/29/2014   Procedure: ESOPHAGOGASTRODUODENOSCOPY (EGD) WITH PROPOFOL;  Surgeon: Wallace Cullens, MD;  Location: [facility] ENDOSCOPY;  Service: Gastroenterology;  Laterality: N/A;   EYE SURGERY Bilateral 2011   lid lift done 3 times   FOOT SURGERY     HAND SURGERY     TONGUE SURGERY     removal of cancer   TONSILLECTOMY     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/13/2016   Procedure: TOTAL HIP ARTHROPLASTY ANTERIOR APPROACH;  Surgeon: Kennedy Bucker, MD;  Location: [facility] ORS;  Service: Orthopedics;  Laterality: Right;   TOTAL KNEE ARTHROPLASTY Right 11/23/2015   Procedure: TOTAL KNEE ARTHROPLASTY;  Surgeon: Kennedy Bucker, MD;  Location: [facility] ORS;  Service: Orthopedics;  Laterality: Right;   VOCAL CORD LATERALIZATION, ENDOSCOPIC APPROACH W/ MLB     Social History:  reports that she has been smoking cigarettes. She has a 25.00 pack-year smoking history. She has never used smokeless tobacco. She reports current alcohol use. She reports current drug use. Drug: Marijuana.  No Known Allergies Family History  Problem Relation Age of Onset   Cancer Father    Heart disease Mother    Family history: Family history reviewed and not pertinent.  Prior to Admission medications   Medication Sig Start Date End Date Taking? Authorizing  Provider  apixaban (ELIQUIS) 5 MG TABS tablet Take 5 mg by mouth 2 (two) times daily.    [provider]  chlorthalidone (HYGROTON) 25 MG tablet Take 25 mg by mouth daily.    [provider]  Cholecalciferol (VITAMIN D3) 5000 units CAPS Take 5,000 Units by mouth daily.    [provider]  clobetasol (TEMOVATE) 0.05 % external solution Apply 1 application topically 2 (two) times daily. 04/13/16   [provider]  diclofenac sodium (VOLTAREN) 1 % GEL Apply 2 g topically 2 (two) times daily as needed (pain).     [provider]  docusate sodium (COLACE) 100 MG capsule Take 1 capsule (100 mg total) by mouth 2 (two) times daily. 06/16/16   Sharman Cheek, MD  dorzolamide-timolol (COSOPT) 2-0.5 % ophthalmic solution Place 1 drop into both eyes 2 (two) times daily.    [provider]  DULoxetine (CYMBALTA) 60 MG capsule Take 60 mg by mouth at bedtime. 03/23/16   [provider]  esomeprazole (NEXIUM) 40 MG capsule Take 40 mg by mouth daily. 05/17/16   [provider]  ferrous sulfate 325 (65 FE) MG tablet Take 325 mg by mouth daily with breakfast.    [provider]  fluticasone (FLONASE) 50 MCG/ACT nasal spray Place 2 sprays into both nostrils daily as needed for allergies.    [provider]  furosemide (LASIX) 20 MG tablet TAKE 1 TABLET BY MOUTH DAILY 04/03/22   Miki Kins, FNP  hydrOXYzine (ATARAX) 25 MG tablet Take 25 mg by mouth at bedtime as needed (insomnia).    [provider]  insulin glargine (LANTUS SOLOSTAR) 100 UNIT/ML Solostar Pen INJECT 30 UNITS SUBCUTANEOUSLY AT BEDTIME. 04/03/22   Miki Kins, FNP  insulin lispro (HUMALOG) 100 UNIT/ML KwikPen INJECT 0.1 ML (10 UNITS TOTAL) UNDER THESKIN 3 TIMES A DAY BEFORE MEALS 04/03/22   Miki Kins, FNP  isosorbide mononitrate (IMDUR) 30 MG 24 hr tablet Take 1 tablet (30 mg total) by mouth daily. 05/12/22   Miki Kins, FNP  losartan  (COZAAR) 50 MG tablet Take 1 tablet (50 mg total) by mouth daily. 04/20/22   Miki Kins, FNP  metFORMIN (GLUCOPHAGE) 500 MG tablet Take 1,000 mg by mouth 2 (two) times daily.    [provider]  metoprolol succinate (TOPROL XL) 50 MG 24 hr tablet Take 1 tablet (50 mg total) by mouth daily. Take with or immediately following a meal. 05/12/22 05/12/23  Miki Kins, FNP  NOVOLOG FLEXPEN 100 UNIT/ML FlexPen Inject 0-15 Units into the skin 3 (three) times daily before meals. Sliding scale based on blood glucose levels. 08/14/14   [provider]  polyethylene glycol powder (GLYCOLAX/MIRALAX) powder 2 cap fulls in a full glass of water, two times a day, for 5 days. 06/16/16   Sharman Cheek, MD  potassium chloride SA (K-DUR,KLOR-CON) 20 MEQ tablet Take 20 mEq by mouth daily.    [provider]  pravastatin (PRAVACHOL) 40 MG tablet TAKE 1 TABLET BY MOUTH DAILY 04/10/22   Miki Kins, FNP  prednisoLONE acetate (PRED FORTE) 1 % ophthalmic suspension Place 1 drop into the right eye 2 (two) times daily.    [provider]  traMADol (ULTRAM) 50 MG tablet TAKE 1 TABLET BY MOUTH UP TO 3 TIMES DAILY AS NEEDED FOR PAIN 04/03/22   Miki Kins, FNP  ULTICARE SHORT PEN NEEDLES 31G X 8 MM MISC  08/14/14   [provider]  VENTOLIN HFA 108 (90 BASE) MCG/ACT inhaler Inhale 2 puffs into the lungs every 6 (six) hours as needed for wheezing or shortness of breath.  07/13/14   [provider]  vitamin B-12 (CYANOCOBALAMIN) 1000 MCG tablet Take 1,000 mcg by mouth daily.    [provider]   Physical Exam: Vitals:   06/10/22 1400 06/10/22 1408 06/10/22 1530 06/10/22 2039  BP: (!) 147/67  (!) 146/55 (!) 142/53  Pulse: 64  61 66  Resp: 12  16 16   Temp:  (!) 97 F (36.1 C) 97.8 F (36.6 C) 97.8 F (36.6 C)  TempSrc:  Axillary  Oral  SpO2: 98%  99% 100%  Weight:       Constitutional: appears age-appropriate, NAD, calm Eyes: PERRL, lids and  conjunctivae normal ENMT: Mucous membranes are moist. Posterior pharynx clear of any exudate or lesions. Age-appropriate dentition. Hearing appropriate Neck: normal, supple, no masses, no thyromegaly Respiratory: clear to auscultation bilaterally, no wheezing, no crackles. Normal respiratory effort. No accessory muscle use.  Cardiovascular: Regular rate and rhythm, no murmurs / rubs / gallops. No extremity edema. 2+ pedal pulses. No carotid bruits.  Abdomen: Obese abdomen, + suprapubic tenderness, no masses palpated, no hepatosplenomegaly. Bowel sounds positive.  Musculoskeletal: no clubbing /  cyanosis. No joint deformity upper and lower extremities. Good ROM, no contractures, no atrophy. Normal muscle tone.  Skin: no rashes, lesions, ulcers. No induration Neurologic: Sensation intact. Strength 5/5 in all 4.  Psychiatric: Normal judgment and insight. Alert and oriented x 3. Normal mood.   EKG: independently reviewed, showing sinus rhythm with rate of 57, QTc 376  Chest x-ray on Admission: I personally reviewed and I agree with radiologist reading as below.  MR BRAIN WO CONTRAST  Result Date: 06/10/2022 CLINICAL DATA:  Stroke follow-up EXAM: MRI HEAD WITHOUT CONTRAST TECHNIQUE: Multiplanar, multiecho pulse sequences of the brain and surrounding structures were obtained without intravenous contrast. COMPARISON:  Same day CTA head and neck. FINDINGS: Brain: Negative for an acute infarct. No hemorrhage. No extra-axial fluid collection. Sequela moderate chronic microvascular ischemic change. No mass effect. There is likely an anterior temporal encephalocele (series 17, image 22) on the left. Vascular: Normal flow voids. Skull and upper cervical spine: Likely moderate spinal canal stenosis at C3-C4 Sinuses/Orbits: Bilateral lens replacement. No mastoid or middle ear effusion. Paranasal sinuses are clear. Other: None. IMPRESSION: 1. No acute intracranial process. 2. Left anterior temporal encephalocele  Electronically Signed   By: Lorenza Cambridge M.D.   On: 06/10/2022 17:54   CT Angio Chest/Abd/Pel for Dissection W and/or Wo Contrast  Result Date: 06/10/2022 CLINICAL DATA:  Acute aortic syndrome (AAS) suspected R sided weakness and slurred speech, abd pain/chest pain and hypotension, AC EXAM: CT ANGIOGRAPHY CHEST, ABDOMEN AND PELVIS TECHNIQUE: Non-contrast CT of the chest was initially obtained. Multidetector CT imaging through the chest, abdomen and pelvis was performed using the standard protocol during bolus administration of intravenous contrast. Multiplanar reconstructed images and MIPs were obtained and reviewed to evaluate the vascular anatomy. RADIATION DOSE REDUCTION: This exam was performed according to the departmental dose-optimization program which includes automated exposure control, adjustment of the mA and/or kV according to patient size and/or use of iterative reconstruction technique. CONTRAST:  80mL OMNIPAQUE IOHEXOL 350 MG/ML SOLN COMPARISON:  06/16/2016 FINDINGS: CTA CHEST FINDINGS Cardiovascular: SVC patent. Heart size normal. No pericardial effusion. The RV is nondilated. Fair contrast opacification of the pulmonary arterial tree, with no convincing filling defects to suggest acute PE. Mitral annulus calcifications. Scattered 3-vessel coronary calcifications. Atheromatous thoracic aorta without dissection or stenosis. Mediastinum/Nodes: No mediastinal hematoma, mass or adenopathy. Lungs/Pleura: No pleural effusion. No pneumothorax. Dependent atelectasis posteriorly. Lungs otherwise clear. Musculoskeletal: Vertebral endplate spurring at multiple levels in the lower thoracic spine. Review of the MIP images confirms the above findings. CTA ABDOMEN AND PELVIS FINDINGS VASCULAR Aorta: Extensive calcified atheromatous plaque without aneurysm, dissection, or stenosis. Celiac: Calcified ostial plaque resulting in short segment stenosis of possible hemodynamic significance, patent distally. SMA:  Calcified ostial plaque resulting in short segment stenosis of at least moderate severity, atheromatous but patent distally. Renals: Single left, with calcified ostial plaque, no significant stenosis. Single right, with calcified plaque resulting in short segment stenosis of at least mild severity, patent distally. IMA: Patent without evidence of aneurysm, dissection, vasculitis or significant stenosis. Inflow: Extensive calcified iliac arterial plaque without aneurysm, resulting in areas of at least mild stenosis bilaterally. Veins: No obvious venous abnormality within the limitations of this arterial phase study. Review of the MIP images confirms the above findings. NON-VASCULAR Hepatobiliary: Post cholecystectomy with distended central intrahepatic and extrahepatic biliary tree as before. No focal liver lesion. Pancreas: Unremarkable. No pancreatic ductal dilatation or surrounding inflammatory changes. Spleen: Normal in size without focal abnormality. Adrenals/Urinary Tract: 12 mm 25 HU  right adrenal nodule stable since 2018 consistent with benign adenoma; no follow-up recommended. No hydronephrosis. Multiple cortical lesions are again seen throughout both kidneys, some of which can be characterized as cysts, largest left upper pole 5.3 cm 9 HU; no follow-up recommended. Urinary bladder physiologically distended. Stomach/Bowel: Stomach is partially distended, unremarkable. Small bowel decompressed. Normal appendix. The colon is partially distended by gas and fecal material. A few descending segment diverticula without adjacent inflammatory change. Mild fecal distention of the rectum. Lymphatic: No abdominal or pelvic adenopathy. Reproductive: Status post hysterectomy. No adnexal masses. Other: No ascites.  No free air. Musculoskeletal: Small paraumbilical hernia containing only mesenteric fat. Postop changes in the anterior pelvic body wall. Surgical clips in the pelvis. Right hip arthroplasty components in  expected location, resulting in significant regional streak artifact. Multilevel spondylitic change in the lower lumbar spine. Review of the MIP images confirms the above findings. IMPRESSION: 1. Negative for acute PE or thoracic aortic dissection. 2. Coronary and aortic atherosclerosis (ICD10-I70.0). 3. Descending segment colonic diverticulosis. 4. Small paraumbilical hernia containing only mesenteric fat. Electronically Signed   By: Corlis Leak M.D.   On: 06/10/2022 12:37   CT Angio Head Neck W WO CM  Result Date: 06/10/2022 CLINICAL DATA:  Stroke suspected EXAM: CT ANGIOGRAPHY HEAD AND NECK WITH AND WITHOUT CONTRAST TECHNIQUE: Multidetector CT imaging of the head and neck was performed using the standard protocol during bolus administration of intravenous contrast. Multiplanar CT image reconstructions and MIPs were obtained to evaluate the vascular anatomy. Carotid stenosis measurements (when applicable) are obtained utilizing NASCET criteria, using the distal internal carotid diameter as the denominator. RADIATION DOSE REDUCTION: This exam was performed according to the departmental dose-optimization program which includes automated exposure control, adjustment of the mA and/or kV according to patient size and/or use of iterative reconstruction technique. CONTRAST:  80mL OMNIPAQUE IOHEXOL 350 MG/ML SOLN COMPARISON:  None Available. FINDINGS: CT HEAD FINDINGS See same day CT head for intracranial findings. CTA NECK FINDINGS Aortic arch: Two-vessel arch. Imaged portion shows no evidence of aneurysm or dissection. No significant stenosis of the major arch vessel origins. Right carotid system: No evidence of dissection, stenosis (50% or greater), or occlusion. Left carotid system: No evidence of dissection, stenosis (50% or greater), or occlusion. Vertebral arteries: Left dominant. There is mild narrowing of the origin of the left vertebral artery the V4 segment of the right vertebral artery is diminutive in the  right vertebral artery predominantly terminates as a PICA. The left PICA is not definitively visualized. No evidence of dissection, stenosis (50% or greater), or occlusion. Skeleton: Negative. Other neck: 1.5 cm left thyroid nodule. Recommend further evaluation with a dedicated thyroid ultrasound, if not previously performed Upper chest: See same day CT chest abdomen pelvis for thoracic findings, including a ground-glass nodule along the posterior aspect of the right upper lobe. Aortic atherosclerotic calcifications. Review of the MIP images confirms the above findings CTA HEAD FINDINGS Anterior circulation: There is moderate narrowing in the cavernous segment of the right ICA. Posterior circulation: No significant stenosis, proximal occlusion, aneurysm, or vascular malformation. Venous sinuses: As permitted by contrast timing, patent. Anatomic variants: Bilateral A2 segments arise from the left A1 Review of the MIP images confirms the above findings IMPRESSION: 1. Left PICA is not visualized. This is age indeterminate. Otherwise no intracranial large vessel occlusion. Moderate narrowing in the cavernous segment of the right ICA. 2. No hemodynamically significant stenosis in the neck. 3. 1.5 cm left thyroid nodule. Recommend further evaluation with  a dedicated thyroid ultrasound, if not previously performed. Aortic Atherosclerosis (ICD10-I70.0). Electronically Signed   By: Lorenza Cambridge M.D.   On: 06/10/2022 12:30   CT HEAD WO CONTRAST  Result Date: 06/10/2022 CLINICAL DATA:  82 year old female last known well 0200 hours. Found down. Hypertensive now. EXAM: CT HEAD WITHOUT CONTRAST TECHNIQUE: Contiguous axial images were obtained from the base of the skull through the vertex without intravenous contrast. RADIATION DOSE REDUCTION: This exam was performed according to the departmental dose-optimization program which includes automated exposure control, adjustment of the mA and/or kV according to patient size  and/or use of iterative reconstruction technique. COMPARISON:  None Available. FINDINGS: Brain: Cerebral volume is within normal limits for age. No midline shift, ventriculomegaly, mass effect, evidence of mass lesion, intracranial hemorrhage or evidence of cortically based acute infarction. Gray-white matter differentiation is within normal limits throughout the brain. Scattered, patchy bilateral cerebral white matter hypodensity. Some deep white matter capsule involvement. Deep gray nuclei relatively spared. No cortical encephalomalacia identified. ASPECTS 10. Vascular: Calcified atherosclerosis at the skull base. No suspicious intracranial vascular hyperdensity. Skull: No acute osseous abnormality identified. Sinuses/Orbits: Visualized paranasal sinuses and mastoids are well aerated. Other: No acute orbit or scalp soft tissue finding. IMPRESSION: 1. No acute intracranial abnormality or acute traumatic injury identified. 2. Mild to moderate for age cerebral white matter changes, most commonly due to small vessel disease. Electronically Signed   By: Odessa Fleming M.D.   On: 06/10/2022 11:09   DG Chest Port 1 View  Result Date: 06/10/2022 CLINICAL DATA:  Questionable sepsis. EXAM: PORTABLE CHEST 1 VIEW COMPARISON:  One-view chest x-ray 26/16 FINDINGS: Heart size is exaggerated by low lung volumes. Central airway scratched at moderate central pulmonary vascular congestion noted. Mild bibasilar airspace disease likely reflects atelectasis. The upper lung fields are clear. The visualized soft tissues and bony thorax are unremarkable. IMPRESSION: 1. Low lung volumes. 2. Moderate central pulmonary vascular congestion. 3. Mild bibasilar airspace disease likely reflects atelectasis. Electronically Signed   By: Marin Roberts M.D.   On: 06/10/2022 11:02    Labs on Admission: I have personally reviewed following labs  CBC: Recent Labs  Lab 06/10/22 1031  WBC 6.5  NEUTROABS 5.0  HGB 9.9*  HCT 31.4*  MCV 87.0   PLT 338   Basic Metabolic Panel: Recent Labs  Lab 06/10/22 1031  NA 136  K 3.8  CL 100  CO2 26  GLUCOSE 202*  BUN 36*  CREATININE 1.65*  CALCIUM 8.4*   GFR: Estimated Creatinine Clearance: 31.8 mL/min (A) (by C-G formula based on SCr of 1.65 mg/dL (H)).  Liver Function Tests: Recent Labs  Lab 06/10/22 1031  AST 21  ALT 11  ALKPHOS 73  BILITOT 0.6  PROT 6.2*  ALBUMIN 3.3*   Coagulation Profile: Recent Labs  Lab 06/10/22 1031  INR 1.4*   CBG: Recent Labs  Lab 06/10/22 1023 06/10/22 1645 06/10/22 2042  GLUCAP 176* 144* 136*   Urine analysis:    Component Value Date/Time   COLORURINE YELLOW (A) 06/10/2022 1048   APPEARANCEUR HAZY (A) 06/10/2022 1048   APPEARANCEUR Clear 08/19/2014 1515   LABSPEC 1.018 06/10/2022 1048   PHURINE 5.0 06/10/2022 1048   GLUCOSEU NEGATIVE 06/10/2022 1048   HGBUR NEGATIVE 06/10/2022 1048   BILIRUBINUR NEGATIVE 06/10/2022 1048   BILIRUBINUR Negative 08/19/2014 1515   KETONESUR NEGATIVE 06/10/2022 1048   PROTEINUR NEGATIVE 06/10/2022 1048   NITRITE NEGATIVE 06/10/2022 1048   LEUKOCYTESUR LARGE (A) 06/10/2022 1048   This document  was prepared using Conservation officer, historic buildings and may include unintentional dictation errors.  Dr. Sedalia Muta [facility]  If 7PM-7AM, please contact overnight-coverage provider If 7AM-7PM, please contact day attending provider [email or web address]  06/10/2022, 8:53 PM

## 2022-06-10 NOTE — Assessment & Plan Note (Addendum)
Patient takes long-acting insulin glargine 30 units nightly however per med reconciliation, patient has not taken her long-acting insulin for over a week and blood glucose is within normal limits Long-acting insulin 30 units has been resumed for 06/10/2020 nightly Insulin SSI with agents coverage ordered, renal dosing Goal inpatient blood glucose levels 140-180

## 2022-06-10 NOTE — ED Notes (Signed)
Two lab techs have attempted blood cultures, no success.

## 2022-06-10 NOTE — ED Triage Notes (Signed)
Pt to ED via [facility] from home. Pt family called EMS due to concerns for stroke. Per Family, pts LKW was this morning around 0200. This morning around 0990, pt was found in the floor. Pt is unsure how she fell. Pt does not think she fell. EMS reports patient is on blood thinners but family is unsure why. Pt noted to have slurred speech, right sided weakness,and right facial droop with EMS. Pt hypotensive on arrival.

## 2022-06-10 NOTE — Hospital Course (Signed)
Ms. Wanda Gardner is an 82 year old female with history of insulin-dependent diabetes mellitus, depression, anxiety, GERD, hypertension, hyperlipidemia, atrial flutter on Eliquis, descending diverticulosis, coronary artery disease, primary hypertension, who presents emergency department for chief concerns of slurred speech.  Last known normal was 2 AM on 06/10/2022.  Vitals showed temperature of 96.1, respiration rate of 13, heart rate 50, blood pressure 136/59, patient had initial SBP in the 60s,, was translate started on Levophed.  At the time of hospitalist consultation, patient has been off Levophed for over 1 hour.  Serum sodium is 136, potassium 3.8, chloride 100, bicarb 26, BUN of 36, serum creatinine 1.65, EGFR 31, nonfasting blood glucose 202, WBC 6.5, hemoglobin 9.9, platelets of 338.  Lactic acid was initially 2.6 and on repeat was 2.2.  High sensitive troponin was initially 5 and on repeat was 6.  UA was positive for large leukocytes.  CT of the head without contrast: read as no acute intracranial abnormality or acute traumatic injury.  CTA head and neck with and without contrast: Left PICA is not visualized.  Age-indeterminate.  Otherwise no intracranial large vessel occlusion.  Moderate narrowing in the cavernous segment of the right ICA.  No hemodynamically significant stenosis in the neck.  CTA chest abdomen pelvis for dissection: Negative for acute PE or thoracic aortic dissection.  Coronary and aortic atherosclerosis.  Descending sigmoid diverticulosis.    ED treatment: Aspirin 324 mg p.o. one-time dose, ondansetron 4 mg IV one-time dose, sodium chloride 1 L bolus x 2, and Levophed gtt. from approximately 10:30 AM until noon.

## 2022-06-10 NOTE — ED Notes (Signed)
Lab called to collect blood cultures due to pt being a difficult stick.

## 2022-06-10 NOTE — ED Notes (Signed)
Third lab tech unable to obtain cultures. MD verbal to start antib

## 2022-06-11 ENCOUNTER — Inpatient Hospital Stay
Admit: 2022-06-11 | Discharge: 2022-06-11 | Disposition: A | Discharge status: Home / Self Care | Payer: Medicare HMO | Attending: Internal Medicine | Admitting: Internal Medicine

## 2022-06-11 DIAGNOSIS — I6389 Other cerebral infarction: Secondary | ICD-10-CM | POA: Diagnosis not present

## 2022-06-11 DIAGNOSIS — R2990 Unspecified symptoms and signs involving the nervous system: Secondary | ICD-10-CM | POA: Diagnosis not present

## 2022-06-11 LAB — CBC WITH DIFFERENTIAL/PLATELET
Abs Immature Granulocytes: 0.02 10*3/uL (ref 0.00–0.07)
Basophils Absolute: 0 10*3/uL (ref 0.0–0.1)
Basophils Relative: 1 %
Eosinophils Absolute: 0.2 10*3/uL (ref 0.0–0.5)
Eosinophils Relative: 3 %
HCT: 35.2 % — ABNORMAL LOW (ref 36.0–46.0)
Hemoglobin: 11.2 g/dL — ABNORMAL LOW (ref 12.0–15.0)
Immature Granulocytes: 0 %
Lymphocytes Relative: 22 %
Lymphs Abs: 1.2 10*3/uL (ref 0.7–4.0)
MCH: 27.4 pg (ref 26.0–34.0)
MCHC: 31.8 g/dL (ref 30.0–36.0)
MCV: 86.1 fL (ref 80.0–100.0)
Monocytes Absolute: 0.3 10*3/uL (ref 0.1–1.0)
Monocytes Relative: 5 %
Neutro Abs: 3.8 10*3/uL (ref 1.7–7.7)
Neutrophils Relative %: 69 %
Platelets: 376 10*3/uL (ref 150–400)
RBC: 4.09 MIL/uL (ref 3.87–5.11)
RDW: 14.1 % (ref 11.5–15.5)
WBC: 5.4 10*3/uL (ref 4.0–10.5)
nRBC: 0 % (ref 0.0–0.2)

## 2022-06-11 LAB — LIPID PANEL
Cholesterol: 132 mg/dL (ref 0–200)
HDL: 39 mg/dL — ABNORMAL LOW (ref >40)
LDL Cholesterol: 73 mg/dL (ref 0–99)
Total CHOL/HDL Ratio: 3.4 RATIO
Triglycerides: 101 mg/dL (ref <150)
VLDL: 20 mg/dL (ref 0–40)

## 2022-06-11 LAB — BASIC METABOLIC PANEL
Anion gap: 10 (ref 5–15)
BUN: 26 mg/dL — ABNORMAL HIGH (ref 8–23)
CO2: 25 mmol/L (ref 22–32)
Calcium: 8.9 mg/dL (ref 8.9–10.3)
Chloride: 103 mmol/L (ref 98–111)
Creatinine, Ser: 1.1 mg/dL — ABNORMAL HIGH (ref 0.44–1.00)
GFR, Estimated: 51 mL/min — ABNORMAL LOW (ref >60)
Glucose, Bld: 163 mg/dL — ABNORMAL HIGH (ref 70–99)
Potassium: 3.6 mmol/L (ref 3.5–5.1)
Sodium: 138 mmol/L (ref 135–145)

## 2022-06-11 LAB — HEMOGLOBIN A1C
Hgb A1c MFr Bld: 7.6 % — ABNORMAL HIGH (ref 4.8–5.6)
Mean Plasma Glucose: 171.42 mg/dL

## 2022-06-11 LAB — GLUCOSE, CAPILLARY
Glucose-Capillary: 146 mg/dL — ABNORMAL HIGH (ref 70–99)
Glucose-Capillary: 208 mg/dL — ABNORMAL HIGH (ref 70–99)
Glucose-Capillary: 235 mg/dL — ABNORMAL HIGH (ref 70–99)
Glucose-Capillary: 157 mg/dL — ABNORMAL HIGH (ref 70–99)

## 2022-06-11 MED ORDER — SODIUM CHLORIDE 0.9 % IV SOLN
1.0000 g | INTRAVENOUS | Status: DC
  Administered 2022-06-11: 1 g via INTRAVENOUS
  Filled 2022-06-11: qty 1

## 2022-06-11 MED ORDER — MUSCLE RUB 10-15 % EX CREA
TOPICAL_CREAM | CUTANEOUS | Status: DC | PRN
  Administered 2022-06-11: 1 via TOPICAL
  Filled 2022-06-11: qty 85

## 2022-06-11 NOTE — Evaluation (Signed)
Physical Therapy Evaluation Patient Details Name: Wanda Gardner MRN: [id number] DOB: 11-10-1940 Today's Date: 06/11/2022  History of Present Illness  presented ot ER after unwitnessed fall, mild R-sided weakness and slurred speech noted; admitted for TIA/CVA work up. MRI negative for acute insult.  Clinical Impression  Patient seated edge of bed upon arrival to session; alert and oriented, follows commands and agreeable to participation with treatment session.  Speech clear and fluent; easily communicates wants/needs, easily participates with/maintains fluent conversation.  Denies pain.  Bilat UE/LE strength and ROM grossly symmetrical and WFL; no focal weakness, sensory or coordination deficit appreciated. Mild baseline neuropathy noted to bilat feet, unchanged with this admission.  Able to complete sit/stand, basic transfers and gait (25') with HHA/furniture walking in room, cga/min assist.  Demonstrates forward flexed posture, constantly reaching/holding bedrails/furniture for support with in-room gait efforts; declined use of RW/SPC.  High-level balance deficits evident; patient with fair awareness/use of compensatory strategies as needed. Would benefit from skilled PT to address above deficits and promote optimal return to PLOF.Marland Kitchen    Recommendations for follow up therapy are one component of a multi-disciplinary discharge planning process, led by the attending physician.  Recommendations may be updated based on patient status, additional functional criteria and insurance authorization.  Follow Up Recommendations       Assistance Recommended at Discharge Frequent or constant Supervision/Assistance  Patient can return home with the following  A little help with walking and/or transfers;A little help with bathing/dressing/bathroom    Equipment Recommendations    Recommendations for Other Services       Functional Status Assessment Patient has had a recent decline in their functional  status and demonstrates the ability to make significant improvements in function in a reasonable and predictable amount of time.     Precautions / Restrictions Precautions Precautions: Fall Restrictions Weight Bearing Restrictions: No      Mobility  Bed Mobility               General bed mobility comments: seated edge of bed upon arrival to room; in recliner end of session    Transfers Overall transfer level: Needs assistance Equipment used: 1 person hand held assist Transfers: Sit to/from Stand Sit to Stand: Min guard                Ambulation/Gait Ambulation/Gait assistance: Min guard Gait Distance (Feet): 25 Feet Assistive device: 1 person hand held assist         General Gait Details: forward flexed posture, constantly reaching/holding bedrails/furniture for support with in-room gait efforts; declined use of RW/SPC  Stairs            Wheelchair Mobility    Modified Rankin (Stroke Patients Only)       Balance Overall balance assessment: Needs assistance Sitting-balance support: No upper extremity supported, Feet supported Sitting balance-Leahy Scale: Good     Standing balance support: Single extremity supported Standing balance-Leahy Scale: Fair                               Pertinent Vitals/Pain Pain Assessment Pain Assessment: No/denies pain    Home Living Family/patient expects to be discharged to:: Private residence Living Arrangements: Alone Available Help at Discharge: Family;Available PRN/intermittently Type of Home: House Home Access: Ramped entrance       Home Layout: One level Home Equipment: Cane - single Librarian, academic (2 wheels)      Prior Function Prior Level  of Function : Independent/Modified Independent             Mobility Comments: Mod indep with ADLs, household mobilization; "furniture walks" in home environment; denies fall history outside of this admission. ADLs Comments: Mod indep  with bathing routine (seat in walk-in shower) and dressing; friends/family provide check in and assist with groceries, meals as needed.     Hand Dominance   Dominant Hand: Right    Extremity/Trunk Assessment   Upper Extremity Assessment Upper Extremity Assessment: Overall WFL for tasks assessed (grossly 4/5 throughout; no focal weakness, coordination or sensory deficits)    Lower Extremity Assessment Lower Extremity Assessment: Overall WFL for tasks assessed (grossly 4/5 throughout; baseline paresthesia bilat feet (neuropathy); otherwise, no focal weakness, coordination or sensory deficits)       Communication   Communication: No difficulties  Cognition Arousal/Alertness: Awake/alert Behavior During Therapy: WFL for tasks assessed/performed Overall Cognitive Status: Within Functional Limits for tasks assessed                                          General Comments      Exercises     Assessment/Plan    PT Assessment Patient needs continued PT services  PT Problem List Decreased activity tolerance;Decreased balance;Decreased mobility;Decreased knowledge of use of DME;Decreased safety awareness;Decreased knowledge of precautions       PT Treatment Interventions DME instruction;Gait training;Functional mobility training;Therapeutic activities;Therapeutic exercise;Balance training;Patient/family education    PT Goals (Current goals can be found in the Care Plan section)  Acute Rehab PT Goals Patient Stated Goal: to return home PT Goal Formulation: With patient Time For Goal Achievement: 06/25/22 Potential to Achieve Goals: Good    Frequency Min 2X/week     Co-evaluation               AM-PAC PT "6 Clicks" Mobility  Outcome Measure Help needed turning from your back to your side while in a flat bed without using bedrails?: None Help needed moving from lying on your back to sitting on the side of a flat bed without using bedrails?: None Help  needed moving to and from a bed to a chair (including a wheelchair)?: A Little Help needed standing up from a chair using your arms (e.g., wheelchair or bedside chair)?: A Little Help needed to walk in hospital room?: A Little Help needed climbing 3-5 steps with a railing? : A Little 6 Click Score: 20    End of Session Equipment Utilized During Treatment: Gait belt Activity Tolerance: Patient tolerated treatment well Patient left: in chair;with call bell/phone within reach;with chair alarm set;with nursing/sitter in room Nurse Communication: Mobility status PT Visit Diagnosis: Muscle weakness (generalized) (M62.81);Difficulty in walking, not elsewhere classified (R26.2)    Time: 1028-1050 PT Time Calculation (min) (ACUTE ONLY): 22 min   Charges:   PT Evaluation $PT Eval Moderate Complexity: 1 Mod         Abram Sax H. Manson Passey, PT, DPT, NCS 06/11/22, 11:05 AM [phone]

## 2022-06-11 NOTE — Evaluation (Signed)
Speech Language Pathology Evaluation Patient Details Name: Wanda Gardner MRN: [id number] DOB: [birth year] Today's Date: 06/11/2022 Time: 4098-1191 SLP Time Calculation (min) (ACUTE ONLY): 30 min  Problem List:  Patient Active Problem List   Diagnosis Date Noted   Stroke-like symptom 06/10/2022   AKI (acute kidney injury) 06/10/2022   Pyuria 06/10/2022   Elevated lactic acid level 06/10/2022   Bicipital tenosynovitis 05/12/2022   Carpal tunnel syndrome of right wrist 08/14/2019   Tobacco use disorder 12/26/2018   Atrial flutter 12/17/2018   Primary osteoarthritis of right hip 06/13/2016   Osteoarthritis of knee 11/23/2015   Cervical radiculopathy 05/26/2015   Impingement syndrome of shoulder region 01/08/2015   Female stress incontinence 08/19/2014   Acute subendocardial infarction, subsequent episode of care 10/30/2013   Headache 07/25/2011   Central retinal vein occlusion 03/31/2011   Tear film insufficiency 03/31/2011   Low back pain 11/09/2010   Low back pain 11/03/2010   Glaucoma associated with vascular disorder of eye 05/23/2010   Background diabetic retinopathy 03/22/2010   Lens replaced 03/22/2010   Chronic glaucoma 03/22/2010   Senile nuclear sclerosis 03/22/2010   Proliferative diabetic retinopathy of both eyes with macular edema associated with type 2 diabetes mellitus 03/22/2010   Primary open-angle glaucoma 03/22/2010   Diabetes mellitus type 2, uncontrolled 07/26/2007   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 Note: Unchanged Note: Unchanged Formatting of this note might be different from the original. Note: Unchanged Note: Unchanged    Constipation 04/20/2011   Formatting of this note might be different from the original. Note: Unchanged - probably [facility]. Note: Unchanged - probably [facility].   COPD (chronic obstructive pulmonary disease)    Cough 10/30/2013   Formatting of this note might be different from the original. Note: Unchanged Note: Unchanged   Depression    Diabetes mellitus 01/10/2022   Diabetes mellitus without complication    Dyspnea    Glaucoma    Headache    right side of head   Hyperlipidemia    Hypertension    Knee stiff 05/12/2022   Neuropathic pain of hands and feet    PONV (postoperative nausea and vomiting) 1982   back with my hysterectomy.   Pseudophakia 03/22/2010   Sleep apnea    Doesn't use CPAP   Stiffness of shoulder joint 05/12/2022   Trigger finger of right hand 01/10/2022   Wears dentures    full upper and lower   Past Surgical History:  Past Surgical History:  Procedure Laterality Date   ABDOMINAL HYSTERECTOMY     CATARACT EXTRACTION W/ INTRAOCULAR LENS  IMPLANT, BILATERAL     CATARACT EXTRACTION W/PHACO Left 12/14/2021   Procedure: CATARACT EXTRACTION PHACO AND INTRAOCULAR LENS PLACEMENT (IOC) LEFT DIABETIC 24.79 02:23.9;  Surgeon: Lockie Mola, MD;  Location: [facility];  Service: Ophthalmology;  Laterality: Left;  Diabetic   CHOLECYSTECTOMY     COLONOSCOPY WITH PROPOFOL N/A 10/29/2014   Procedure: COLONOSCOPY WITH PROPOFOL;  Surgeon: Wallace Cullens, MD;  Location: [facility];  Service: Gastroenterology;  Laterality: N/A;   ESOPHAGOGASTRODUODENOSCOPY (EGD) WITH PROPOFOL N/A 10/29/2014   Procedure: ESOPHAGOGASTRODUODENOSCOPY (EGD) WITH PROPOFOL;  Surgeon: Wallace Cullens,  MD;  Location: [facility] ENDOSCOPY;  Service: Gastroenterology;  Laterality: N/A;   EYE SURGERY Bilateral 2011   lid lift done 3 times   FOOT SURGERY     HAND SURGERY     TONGUE SURGERY     removal of cancer   TONSILLECTOMY     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/13/2016   Procedure: TOTAL HIP ARTHROPLASTY  ANTERIOR APPROACH;  Surgeon: Kennedy Bucker, MD;  Location: [facility] ORS;  Service: Orthopedics;  Laterality: Right;   TOTAL KNEE ARTHROPLASTY Right 11/23/2015   Procedure: TOTAL KNEE ARTHROPLASTY;  Surgeon: Kennedy Bucker, MD;  Location: [facility] ORS;  Service: Orthopedics;  Laterality: Right;   VOCAL CORD LATERALIZATION, ENDOSCOPIC APPROACH W/ MLB     HPI:  82 year old female with history of insulin-dependent diabetes mellitus, depression, anxiety, GERD, hypertension, hyperlipidemia, atrial flutter on Eliquis, descending diverticulosis, coronary artery disease, primary hypertension, who presents emergency department for chief concerns of slurred speech. MRI brain "1. No acute intracranial process.  2. Left anterior temporal encephalocele."   Assessment / Plan / Recommendation Clinical Impression  Pt seen for cognitive-linguistic evaluation. Assessment completed via informal means. Pt demonstrated intact basic speech/language/cognitive ability. Very minimal articulatory imprecision noted due to edentulism. Dentures present, but not donned for evaluation. This is baseline pt per report. Pt denies changes to speech/language/cognition. SLP to sign off as pt has no acute SLP needs at this time.    SLP Assessment  SLP Recommendation/Assessment: Patient does not need any further Speech Lanaguage Pathology Services    Recommendations for follow up therapy are one component of a multi-disciplinary discharge planning process, led by the attending physician.  Recommendations may be updated based on patient status, additional functional criteria and insurance authorization.    Follow Up Recommendations  No SLP follow up    Assistance Recommended at Discharge  PRN (defer to OT/PT)  Functional Status Assessment Patient has not had a recent decline in their functional status  Frequency and Duration           SLP Evaluation Cognition  Overall Cognitive Status: Within Functional Limits for tasks  assessed Orientation Level: Oriented X4 Memory: Appears intact Awareness: Appears intact Problem Solving: Appears intact Safety/Judgment: Appears intact       Comprehension  Auditory Comprehension Overall Auditory Comprehension: Appears within functional limits for tasks assessed Yes/No Questions: Within Functional Limits Commands: Within Functional Limits Conversation: Simple Visual Recognition/Discrimination Discrimination: Not tested Reading Comprehension Reading Status: Not tested    Expression Expression Primary Mode of Expression: Verbal Verbal Expression Overall Verbal Expression: Appears within functional limits for tasks assessed Initiation: No impairment Automatic Speech: Social Response (WFL) Repetition: No impairment Naming: No impairment Written Expression Dominant Hand: Right   Oral / Motor  Oral Motor/Sensory Function Overall Oral Motor/Sensory Function: Within functional limits Motor Speech Overall Motor Speech: Appears within functional limits for tasks assessed Respiration: Within functional limits Resonance: Within functional limits Articulation: Within functional limitis (minimal articulatory imprecision due to edentulism; dentures not donned for evaluation; baseline per pt) Intelligibility: Intelligible Motor Planning: Witnin functional limits Motor Speech Errors: Not applicable Interfering Components: Inadequate dentition           Clyde Canterbury, M.S., CCC-SLP Speech-Language Pathologist [facility] [phone] (ASCOM)  Woodroe Chen 06/11/2022, 1:28 PM

## 2022-06-11 NOTE — Progress Notes (Signed)
PROGRESS NOTE    Wanda Gardner  KGM:[id number] DOB: Feb 10, 1941 DOA: 06/10/2022 PCP: Miki Kins, FNP    Brief Narrative:  82 year old female with history of insulin-dependent diabetes mellitus, depression, anxiety, GERD, hypertension, hyperlipidemia, atrial flutter on Eliquis, descending diverticulosis, coronary artery disease, primary hypertension, who presents emergency department for chief concerns of slurred speech.   Lactic acid was initially 2.6 and on repeat was 2.2.  High sensitive troponin was initially 5 and on repeat was 6.   UA was positive for large leukocytes.  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  Prerenal azotemia versus ATN Suspect prerenal as this has improved after administration of intravenous fluid Plan: DC IVF for now Encourage p.o. fluid intake Recheck creatinine in a.m.  Tobacco abuse Offer nicotine patch  Type 2 diabetes mellitus with hyperglycemia Semglee 30 units nightly Sliding scale  Atrial flutter Rate  controlled Reduced dose Eliquis  DVT prophylaxis: Eliquis Code Status: Full Family Communication: Daughter Frutoso Chase [phone] on 4/21 Disposition Plan: Status is: Inpatient Remains inpatient appropriate because: Altered mental status status post fall.  Suspected urinary tract infection.  Slowly recovering.  Anticipate discharge 4/22.   Level of care: Telemetry Medical  Consultants:  Neurology  Procedures:  None  Antimicrobials: Ceftriaxone   Subjective: Seen and examined.  Unsure exactly what happened.  No specific complaints this morning.  Feels well overall.  Objective: Vitals:   06/10/22 2039 06/11/22 0012 06/11/22 0347 06/11/22 0814  BP: (!) 142/53 (!) 128/51 (!) 143/53 (!) 133/42  Pulse: 66 67 71 73  Resp: 16 16 16 17   Temp: 97.8 F (36.6 C) 98.4 F (36.9 C) 98.6 F (37 C) 98.4 F (36.9 C)  TempSrc: Oral Oral Oral   SpO2: 100% 91% 99% 98%  Weight:        Intake/Output Summary (Last 24 hours) at 06/11/2022 1200 Last data filed at 06/11/2022 5956 Gross per 24 hour  Intake 2003.9 ml  Output --  Net 2003.9 ml   Filed Weights   06/10/22 1032  Weight: 92.8 kg    Examination:  General exam: Appears calm and comfortable  Respiratory system: Clear to auscultation. Respiratory effort normal. Cardiovascular system: S1-S2, RRR, no murmurs, no pedal edema Gastrointestinal system: Soft, NT/ND, normal bowel sounds Central nervous system: Alert and oriented. No focal neurological deficits. Extremities: Symmetric 5 x 5 power. Skin: No rashes, lesions or ulcers Psychiatry: Judgement and insight appear normal. Mood & affect appropriate.     Data Reviewed: I have personally reviewed following labs and imaging studies  CBC: Recent Labs  Lab 06/10/22 1031 06/11/22 0651  WBC 6.5 5.4  NEUTROABS 5.0  3.8  HGB 9.9* 11.2*  HCT 31.4* 35.2*  MCV 87.0 86.1  PLT 338 376   Basic Metabolic Panel: Recent Labs  Lab 06/10/22 1031 06/11/22 0651  NA 136 138  K 3.8  3.6  CL 100 103  CO2 26 25  GLUCOSE 202* 163*  BUN 36* 26*  CREATININE 1.65* 1.10*  CALCIUM 8.4* 8.9   GFR: Estimated Creatinine Clearance: 47.7 mL/min (A) (by C-G formula based on SCr of 1.1 mg/dL (H)). Liver Function Tests: Recent Labs  Lab 06/10/22 1031  AST 21  ALT 11  ALKPHOS 73  BILITOT 0.6  PROT 6.2*  ALBUMIN 3.3*   No results for input(s): "LIPASE", "AMYLASE" in the last 168 hours. No results for input(s): "AMMONIA" in the last 168 hours. Coagulation Profile: Recent Labs  Lab 06/10/22 1031  INR 1.4*   Cardiac Enzymes: No results for input(s): "CKTOTAL", "CKMB", "CKMBINDEX", "TROPONINI" in the last 168 hours. BNP (last 3 results) No results for input(s): "PROBNP" in the last 8760 hours. HbA1C: No results for input(s): "HGBA1C" in the last 72 hours. CBG: Recent Labs  Lab 06/10/22 1023 06/10/22 1645 06/10/22 2042 06/11/22 0816  GLUCAP 176* 144* 136* 146*   Lipid Profile: Recent Labs    06/11/22 0651  CHOL 132  HDL 39*  LDLCALC 73  TRIG 161  CHOLHDL 3.4   Thyroid Function Tests: No results for input(s): "TSH", "T4TOTAL", "FREET4", "T3FREE", "THYROIDAB" in the last 72 hours. Anemia Panel: No results for input(s): "VITAMINB12", "FOLATE", "FERRITIN", "TIBC", "IRON", "RETICCTPCT" in the last 72 hours. Sepsis Labs: Recent Labs  Lab 06/10/22 1031 06/10/22 1241  PROCALCITON  --  <0.10  LATICACIDVEN 2.6* 2.2*    Recent Results (from the past 240 hour(s))  Blood Culture (routine x 2)     Status: None (Preliminary result)   Collection Time: 06/10/22  7:49 PM   Specimen: BLOOD  Result Value Ref Range Status   Specimen Description BLOOD BLOOD RIGHT HAND  Final   Special Requests IN PEDIATRIC BOTTLE Blood Culture adequate volume  Final   Culture   Final    NO GROWTH < 12 HOURS Performed at [facility], [address], [place], [place] [address]    Report Status PENDING  Incomplete         Radiology Studies: MR BRAIN WO  CONTRAST  Result Date: 06/10/2022 CLINICAL DATA:  Stroke follow-up EXAM: MRI HEAD WITHOUT CONTRAST TECHNIQUE: Multiplanar, multiecho pulse sequences of the brain and surrounding structures were obtained without intravenous contrast. COMPARISON:  Same day CTA head and neck. FINDINGS: Brain: Negative for an acute infarct. No hemorrhage. No extra-axial fluid collection. Sequela moderate chronic microvascular ischemic change. No mass effect. There is likely an anterior temporal encephalocele (series 17, image 22) on the left. Vascular: Normal flow voids. Skull and upper cervical spine: Likely moderate spinal canal stenosis at C3-C4 Sinuses/Orbits: Bilateral lens replacement. No mastoid or middle ear effusion. Paranasal sinuses are clear. Other: None. IMPRESSION: 1. No acute intracranial process. 2. Left anterior temporal encephalocele Electronically Signed   By: Lorenza Cambridge M.D.   On: 06/10/2022 17:54   CT Angio Chest/Abd/Pel for Dissection W and/or Wo Contrast  Result Date: 06/10/2022 CLINICAL DATA:  Acute aortic syndrome (AAS) suspected R sided weakness and slurred speech, abd pain/chest pain and hypotension, AC EXAM: CT ANGIOGRAPHY CHEST, ABDOMEN AND PELVIS TECHNIQUE: Non-contrast CT of the chest was initially obtained. Multidetector CT imaging through the chest, abdomen and pelvis was performed using the standard protocol during bolus administration  of intravenous contrast. Multiplanar reconstructed images and MIPs were obtained and reviewed to evaluate the vascular anatomy. RADIATION DOSE REDUCTION: This exam was performed according to the departmental dose-optimization program which includes automated exposure control, adjustment of the mA and/or kV according to patient size and/or use of iterative reconstruction technique. CONTRAST:  80mL OMNIPAQUE IOHEXOL 350 MG/ML SOLN COMPARISON:  06/16/2016 FINDINGS: CTA CHEST FINDINGS Cardiovascular: SVC patent. Heart size normal. No pericardial effusion. The RV is  nondilated. Fair contrast opacification of the pulmonary arterial tree, with no convincing filling defects to suggest acute PE. Mitral annulus calcifications. Scattered 3-vessel coronary calcifications. Atheromatous thoracic aorta without dissection or stenosis. Mediastinum/Nodes: No mediastinal hematoma, mass or adenopathy. Lungs/Pleura: No pleural effusion. No pneumothorax. Dependent atelectasis posteriorly. Lungs otherwise clear. Musculoskeletal: Vertebral endplate spurring at multiple levels in the lower thoracic spine. Review of the MIP images confirms the above findings. CTA ABDOMEN AND PELVIS FINDINGS VASCULAR Aorta: Extensive calcified atheromatous plaque without aneurysm, dissection, or stenosis. Celiac: Calcified ostial plaque resulting in short segment stenosis of possible hemodynamic significance, patent distally. SMA: Calcified ostial plaque resulting in short segment stenosis of at least moderate severity, atheromatous but patent distally. Renals: Single left, with calcified ostial plaque, no significant stenosis. Single right, with calcified plaque resulting in short segment stenosis of at least mild severity, patent distally. IMA: Patent without evidence of aneurysm, dissection, vasculitis or significant stenosis. Inflow: Extensive calcified iliac arterial plaque without aneurysm, resulting in areas of at least mild stenosis bilaterally. Veins: No obvious venous abnormality within the limitations of this arterial phase study. Review of the MIP images confirms the above findings. NON-VASCULAR Hepatobiliary: Post cholecystectomy with distended central intrahepatic and extrahepatic biliary tree as before. No focal liver lesion. Pancreas: Unremarkable. No pancreatic ductal dilatation or surrounding inflammatory changes. Spleen: Normal in size without focal abnormality. Adrenals/Urinary Tract: 12 mm 25 HU right adrenal nodule stable since 2018 consistent with benign adenoma; no follow-up recommended. No  hydronephrosis. Multiple cortical lesions are again seen throughout both kidneys, some of which can be characterized as cysts, largest left upper pole 5.3 cm 9 HU; no follow-up recommended. Urinary bladder physiologically distended. Stomach/Bowel: Stomach is partially distended, unremarkable. Small bowel decompressed. Normal appendix. The colon is partially distended by gas and fecal material. A few descending segment diverticula without adjacent inflammatory change. Mild fecal distention of the rectum. Lymphatic: No abdominal or pelvic adenopathy. Reproductive: Status post hysterectomy. No adnexal masses. Other: No ascites.  No free air. Musculoskeletal: Small paraumbilical hernia containing only mesenteric fat. Postop changes in the anterior pelvic body wall. Surgical clips in the pelvis. Right hip arthroplasty components in expected location, resulting in significant regional streak artifact. Multilevel spondylitic change in the lower lumbar spine. Review of the MIP images confirms the above findings. IMPRESSION: 1. Negative for acute PE or thoracic aortic dissection. 2. Coronary and aortic atherosclerosis (ICD10-I70.0). 3. Descending segment colonic diverticulosis. 4. Small paraumbilical hernia containing only mesenteric fat. Electronically Signed   By: Corlis Leak M.D.   On: 06/10/2022 12:37   CT Angio Head Neck W WO CM  Result Date: 06/10/2022 CLINICAL DATA:  Stroke suspected EXAM: CT ANGIOGRAPHY HEAD AND NECK WITH AND WITHOUT CONTRAST TECHNIQUE: Multidetector CT imaging of the head and neck was performed using the standard protocol during bolus administration of intravenous contrast. Multiplanar CT image reconstructions and MIPs were obtained to evaluate the vascular anatomy. Carotid stenosis measurements (when applicable) are obtained utilizing NASCET criteria, using the distal internal carotid diameter as the denominator. RADIATION DOSE REDUCTION:  This exam was performed according to the departmental  dose-optimization program which includes automated exposure control, adjustment of the mA and/or kV according to patient size and/or use of iterative reconstruction technique. CONTRAST:  80mL OMNIPAQUE IOHEXOL 350 MG/ML SOLN COMPARISON:  None Available. FINDINGS: CT HEAD FINDINGS See same day CT head for intracranial findings. CTA NECK FINDINGS Aortic arch: Two-vessel arch. Imaged portion shows no evidence of aneurysm or dissection. No significant stenosis of the major arch vessel origins. Right carotid system: No evidence of dissection, stenosis (50% or greater), or occlusion. Left carotid system: No evidence of dissection, stenosis (50% or greater), or occlusion. Vertebral arteries: Left dominant. There is mild narrowing of the origin of the left vertebral artery the V4 segment of the right vertebral artery is diminutive in the right vertebral artery predominantly terminates as a PICA. The left PICA is not definitively visualized. No evidence of dissection, stenosis (50% or greater), or occlusion. Skeleton: Negative. Other neck: 1.5 cm left thyroid nodule. Recommend further evaluation with a dedicated thyroid ultrasound, if not previously performed Upper chest: See same day CT chest abdomen pelvis for thoracic findings, including a ground-glass nodule along the posterior aspect of the right upper lobe. Aortic atherosclerotic calcifications. Review of the MIP images confirms the above findings CTA HEAD FINDINGS Anterior circulation: There is moderate narrowing in the cavernous segment of the right ICA. Posterior circulation: No significant stenosis, proximal occlusion, aneurysm, or vascular malformation. Venous sinuses: As permitted by contrast timing, patent. Anatomic variants: Bilateral A2 segments arise from the left A1 Review of the MIP images confirms the above findings IMPRESSION: 1. Left PICA is not visualized. This is age indeterminate. Otherwise no intracranial large vessel occlusion. Moderate narrowing in  the cavernous segment of the right ICA. 2. No hemodynamically significant stenosis in the neck. 3. 1.5 cm left thyroid nodule. Recommend further evaluation with a dedicated thyroid ultrasound, if not previously performed. Aortic Atherosclerosis (ICD10-I70.0). Electronically Signed   By: Lorenza Cambridge M.D.   On: 06/10/2022 12:30   CT HEAD WO CONTRAST  Result Date: 06/10/2022 CLINICAL DATA:  82 year old female last known well 0200 hours. Found down. Hypertensive now. EXAM: CT HEAD WITHOUT CONTRAST TECHNIQUE: Contiguous axial images were obtained from the base of the skull through the vertex without intravenous contrast. RADIATION DOSE REDUCTION: This exam was performed according to the departmental dose-optimization program which includes automated exposure control, adjustment of the mA and/or kV according to patient size and/or use of iterative reconstruction technique. COMPARISON:  None Available. FINDINGS: Brain: Cerebral volume is within normal limits for age. No midline shift, ventriculomegaly, mass effect, evidence of mass lesion, intracranial hemorrhage or evidence of cortically based acute infarction. Gray-white matter differentiation is within normal limits throughout the brain. Scattered, patchy bilateral cerebral white matter hypodensity. Some deep white matter capsule involvement. Deep gray nuclei relatively spared. No cortical encephalomalacia identified. ASPECTS 10. Vascular: Calcified atherosclerosis at the skull base. No suspicious intracranial vascular hyperdensity. Skull: No acute osseous abnormality identified. Sinuses/Orbits: Visualized paranasal sinuses and mastoids are well aerated. Other: No acute orbit or scalp soft tissue finding. IMPRESSION: 1. No acute intracranial abnormality or acute traumatic injury identified. 2. Mild to moderate for age cerebral white matter changes, most commonly due to small vessel disease. Electronically Signed   By: Odessa Fleming M.D.   On: 06/10/2022 11:09   DG  Chest Port 1 View  Result Date: 06/10/2022 CLINICAL DATA:  Questionable sepsis. EXAM: PORTABLE CHEST 1 VIEW COMPARISON:  One-view chest x-ray 26/16 FINDINGS: Heart  size is exaggerated by low lung volumes. Central airway scratched at moderate central pulmonary vascular congestion noted. Mild bibasilar airspace disease likely reflects atelectasis. The upper lung fields are clear. The visualized soft tissues and bony thorax are unremarkable. IMPRESSION: 1. Low lung volumes. 2. Moderate central pulmonary vascular congestion. 3. Mild bibasilar airspace disease likely reflects atelectasis. Electronically Signed   By: Marin Roberts M.D.   On: 06/10/2022 11:02        Scheduled Meds:  DULoxetine  60 mg Oral QHS   ferrous sulfate  325 mg Oral Q breakfast   insulin aspart  0-5 Units Subcutaneous QHS   insulin aspart  0-9 Units Subcutaneous TID WC   insulin glargine-yfgn  30 Units Subcutaneous QHS   isosorbide mononitrate  30 mg Oral Daily   metoprolol succinate  50 mg Oral Daily   pravastatin  40 mg Oral q1800   Continuous Infusions:  sodium chloride 20 mL/hr at 06/10/22 1613   cefTRIAXone (ROCEPHIN)  IV       LOS: 1 day     Tresa Moore, MD [facility]   If 7PM-7AM, please contact night-coverage  06/11/2022, 12:00 PM

## 2022-06-11 NOTE — Evaluation (Signed)
Occupational Therapy Evaluation Patient Details Name: Wanda Gardner MRN: [id number] DOB: 1940/03/21 Today's Date: 06/11/2022   History of Present Illness presented ot ER after unwitnessed fall, mild R-sided weakness and slurred speech noted; admitted for TIA/CVA work up. MRI negative for acute insult.   Clinical Impression   Patient received for OT evaluation. See flowsheet below for details of function. Generally, patient (I) for bed mobility, supervision for functional transfer to [facility], and setup-MIN A for ADLs. Pt is very independent-minded, not open to AD at this time despite furniture cruising at home.  Patient will benefit from continued OT while in acute care.     Recommendations for follow up therapy are one component of a multi-disciplinary discharge planning process, led by the attending physician.  Recommendations may be updated based on patient status, additional functional criteria and insurance authorization.   Assistance Recommended at Discharge Intermittent Supervision/Assistance  Patient can return home with the following Direct supervision/assist for financial management;Assist for transportation;Help with stairs or ramp for entrance;Assistance with cooking/housework    Functional Status Assessment  Patient has had a recent decline in their functional status and demonstrates the ability to make significant improvements in function in a reasonable and predictable amount of time.  Equipment Recommendations  None recommended by OT    Recommendations for Other Services       Precautions / Restrictions Precautions Precautions: Fall Restrictions Weight Bearing Restrictions: No      Mobility Bed Mobility Overal bed mobility: Modified Independent             General bed mobility comments: T/f supine to sit. Stayed seated at EOB at end of session.    Transfers Overall transfer level: Needs assistance Equipment used: None Transfers: Bed to  chair/wheelchair/BSC   Stand pivot transfers: Supervision         General transfer comment: Pt is a fall risk due to hx of falls in the last 6 months; however, is very independent-minded and wants to be able to get up unassisted to [facility] due to urinary urgency; OT discussed with RN and MD pt's desire for autonomy over safety.      Balance Overall balance assessment: Mild deficits observed, not formally tested                                         ADL either performed or assessed with clinical judgement   ADL Overall ADL's : Needs assistance/impaired                                       General ADL Comments: Pt likely close to her functional baseline; declined further ADLs today besides demonstrating figure four at EOB (so likely independent LB dressing) and demonstrating stand pivot transfer to [facility] next to bed without OT assistance. Pt very particular about where BSC went (at foot of bed).     Vision         Perception     Praxis      Pertinent Vitals/Pain Pain Assessment Pain Assessment: 0-10 Pain Score:  (slight pain in back) Pain Location: back, chronic Pain Descriptors / Indicators: Aching Pain Intervention(s): Limited activity within patient's tolerance     Hand Dominance Right   Extremity/Trunk Assessment Upper Extremity Assessment Upper Extremity Assessment: Overall WFL for tasks assessed (BIL symmetrical;  R trigger finger causing pain with gripping)   Lower Extremity Assessment Lower Extremity Assessment: Overall WFL for tasks assessed       Communication Communication Communication: No difficulties   Cognition Arousal/Alertness: Awake/alert Behavior During Therapy: WFL for tasks assessed/performed Overall Cognitive Status: Within Functional Limits for tasks assessed                                 General Comments: A+O; able to name her medications, able to name her living siblings. Pt stating "my  momma taught [place] to be independent"; not likely to take therapy recommendations for changes at this time, appears to be ok with her current way of doing mobility and ADLs at home, even if furniture cruising is not as safe as AD.     General Comments  Pleasant and agreeable to demonstrating her transfer to [facility]; independent mindset and not wishing for changes at this time. On room air.    Exercises     Shoulder Instructions      Home Living Family/patient expects to be discharged to:: Private residence Living Arrangements: Alone Available Help at Discharge: Family;Available PRN/intermittently Type of Home: House Home Access: Ramped entrance     Home Layout: One level     Bathroom Shower/Tub: Producer, television/film/video: Standard (has BSC next to bed at home)     Home Equipment: Cane - single Librarian, academic (2 wheels);Shower seat;BSC/3in1   Additional Comments: Uses BSC next to bed at home due to urinary urgency      Prior Functioning/Environment Prior Level of Function : Independent/Modified Independent;History of Falls (last six months)             Mobility Comments: Mod indep with ADLs, household mobilization; "furniture walks" in home environment; States that she has had about 3 falls since [place] of last year, plus fall that led to this admission which she does not remember. ADLs Comments: (I) bathing, dressing, grooming, self-feeding. Family assists with IADLs such as driving and grocery shopping and meal preparation (pt occasionally drives). Pt enjoys being with her german shepherd dog; states she cannot let the dog out so she uses the dog pee pads in the house; states she can sit on bench at the end of her bed and change the dog's pee pad. Sounds like brother and daughter are involved in pt's care.        OT Problem List: Impaired balance (sitting and/or standing);Decreased activity tolerance;Decreased safety awareness      OT Treatment/Interventions:  Self-care/ADL training;Therapeutic exercise;Patient/family education;Therapeutic activities    OT Goals(Current goals can be found in the care plan section) Acute Rehab OT Goals Patient Stated Goal: Go home and be independent OT Goal Formulation: With patient Time For Goal Achievement: 06/25/22 Potential to Achieve Goals: Good ADL Goals Pt Will Perform Grooming: with modified independence;standing Pt Will Perform Lower Body Bathing: with modified independence;sit to/from stand Pt Will Perform Lower Body Dressing: with modified independence;sit to/from stand Pt Will Perform Toileting - Clothing Manipulation and hygiene: with modified independence;sit to/from stand Pt Will Perform Tub/Shower Transfer: with modified independence;shower seat  OT Frequency: Min 1X/week    Co-evaluation              AM-PAC OT "6 Clicks" Daily Activity     Outcome Measure Help from another person eating meals?: None Help from another person taking care of personal grooming?: A Little Help from another person  toileting, which includes using toliet, bedpan, or urinal?: A Little Help from another person bathing (including washing, rinsing, drying)?: A Little Help from another person to put on and taking off regular upper body clothing?: None Help from another person to put on and taking off regular lower body clothing?: None 6 Click Score: 21   End of Session Nurse Communication: Mobility status  Activity Tolerance: Patient tolerated treatment well Patient left: Other (comment) (seated EOB, bed alarm off, RN/NA/MD aware)  OT Visit Diagnosis: Repeated falls (R29.6)                Time: 3235-5732 OT Time Calculation (min): 16 min Charges:  OT General Charges $OT Visit: 1 Visit OT Evaluation $OT Eval Moderate Complexity: 1 Mod  Preslei Blakley Junie Panning, MS, OTR/L  Alvester Morin 06/11/2022, 2:58 PM

## 2022-06-11 NOTE — Plan of Care (Signed)
  Problem: Education: Goal: Knowledge of disease or condition will improve Outcome: Progressing Goal: Knowledge of secondary prevention will improve (MUST DOCUMENT ALL) Outcome: Progressing Goal: Knowledge of patient specific risk factors will improve (Mark N/A or DELETE if not current risk factor) Outcome: Progressing   Problem: Ischemic Stroke/TIA Tissue Perfusion: Goal: Complications of ischemic stroke/TIA will be minimized Outcome: Progressing   Problem: Coping: Goal: Will verbalize positive feelings about self Outcome: Progressing Goal: Will identify appropriate support needs Outcome: Progressing   Problem: Health Behavior/Discharge Planning: Goal: Ability to manage health-related needs will improve Outcome: Progressing Goal: Goals will be collaboratively established with patient/family Outcome: Progressing   Problem: Self-Care: Goal: Ability to participate in self-care as condition permits will improve Outcome: Progressing Goal: Verbalization of feelings and concerns over difficulty with self-care will improve Outcome: Progressing Goal: Ability to communicate needs accurately will improve Outcome: Progressing   Problem: Nutrition: Goal: Risk of aspiration will decrease Outcome: Progressing Goal: Dietary intake will improve Outcome: Progressing   

## 2022-06-11 NOTE — Progress Notes (Signed)
  Echocardiogram 2D Echocardiogram has been performed.  Wanda Gardner 06/11/2022, 8:10 AM

## 2022-06-12 DIAGNOSIS — R2990 Unspecified symptoms and signs involving the nervous system: Secondary | ICD-10-CM | POA: Diagnosis not present

## 2022-06-12 LAB — CBC WITH DIFFERENTIAL/PLATELET
Abs Immature Granulocytes: 0.01 10*3/uL (ref 0.00–0.07)
Basophils Absolute: 0 10*3/uL (ref 0.0–0.1)
Basophils Relative: 1 %
Eosinophils Absolute: 0.2 10*3/uL (ref 0.0–0.5)
Eosinophils Relative: 3 %
HCT: 35 % — ABNORMAL LOW (ref 36.0–46.0)
Hemoglobin: 10.8 g/dL — ABNORMAL LOW (ref 12.0–15.0)
Immature Granulocytes: 0 %
Lymphocytes Relative: 33 %
Lymphs Abs: 1.4 10*3/uL (ref 0.7–4.0)
MCH: 27.1 pg (ref 26.0–34.0)
MCHC: 30.9 g/dL (ref 30.0–36.0)
MCV: 87.9 fL (ref 80.0–100.0)
Monocytes Absolute: 0.3 10*3/uL (ref 0.1–1.0)
Monocytes Relative: 8 %
Neutro Abs: 2.5 10*3/uL (ref 1.7–7.7)
Neutrophils Relative %: 55 %
Platelets: 309 10*3/uL (ref 150–400)
RBC: 3.98 MIL/uL (ref 3.87–5.11)
RDW: 13.9 % (ref 11.5–15.5)
WBC: 4.4 10*3/uL (ref 4.0–10.5)
nRBC: 0 % (ref 0.0–0.2)

## 2022-06-12 LAB — ECHOCARDIOGRAM COMPLETE
AR max vel: 2.62 cm2
AV Peak grad: 7.6 mmHg
Ao pk vel: 1.38 m/s
Area-P 1/2: 3.53 cm2
S' Lateral: 2.5 cm
Weight: 3272 oz

## 2022-06-12 LAB — BASIC METABOLIC PANEL
Anion gap: 8 (ref 5–15)
BUN: 16 mg/dL (ref 8–23)
CO2: 28 mmol/L (ref 22–32)
Calcium: 9.3 mg/dL (ref 8.9–10.3)
Chloride: 104 mmol/L (ref 98–111)
Creatinine, Ser: 0.84 mg/dL (ref 0.44–1.00)
GFR, Estimated: >60 mL/min (ref >60)
Glucose, Bld: 128 mg/dL — ABNORMAL HIGH (ref 70–99)
Potassium: 3.6 mmol/L (ref 3.5–5.1)
Sodium: 140 mmol/L (ref 135–145)

## 2022-06-12 LAB — GLUCOSE, CAPILLARY: Glucose-Capillary: 114 mg/dL — ABNORMAL HIGH (ref 70–99)

## 2022-06-12 MED ORDER — LACTULOSE 10 GM/15ML PO SOLN
20.0000 g | Freq: Every day | ORAL | Status: DC
  Administered 2022-06-12: 20 g via ORAL
  Filled 2022-06-12: qty 30

## 2022-06-12 MED ORDER — BISACODYL 10 MG RE SUPP: 10.0000 mg | Freq: Every day | RECTAL | Status: DC | PRN

## 2022-06-12 NOTE — Discharge Summary (Signed)
Physician Discharge Summary  Wanda Gardner WUJ:[id number] DOB: 05-Aug-1940 DOA: 06/10/2022  PCP: Miki Kins, FNP  Admit date: 06/10/2022 Discharge date: 06/12/2022  Admitted From: Home Disposition:  Home with home health  Recommendations for Outpatient Follow-up:  Follow up with PCP in 1-2 weeks   Home Health:Yes PT OT RN Equipment/Devices:None  Discharge Condition:Stable  CODE STATUS:FULL  Diet recommendation: Reg  Brief/Interim Summary: 82 year old female with history of insulin-dependent diabetes mellitus, depression, anxiety, GERD, hypertension, hyperlipidemia, atrial flutter on Eliquis, descending diverticulosis, coronary artery disease, primary hypertension, who presents emergency department for chief concerns of slurred speech.    Lactic acid was initially 2.6 and on repeat was 2.2.  High sensitive troponin was initially 5 and on repeat was 6.   UA was positive for large leukocytes.  MRI imaging survey negative for CVA Patient received 3 doses of IV Rocephin in house.  Back to baseline at time of discharge.  Unclear nature of syncopal event.  Possibly related to underlying urinary tract infection.  No antibiotics indicated at time of discharge the patient received total course while admitted.    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  2D echocardiogram reassuring.  Seen by PT and OT.  Home health recommended.  Has been ordered on discharge.  Stable for DC home at this time.   Pyuria Associated with altered mentation and increased lactic acid UTI cannot be excluded Plan: Received 3 g total IV Rocephin in house.  No antibiotics indicated on discharge.  Mentation intact.  Discharge Instructions  Discharge Instructions     Ambulatory Referral for Lung Cancer Scre   Complete by: As directed    Diet - low sodium heart healthy   Complete by: As directed    Increase activity slowly   Complete by: As directed       Allergies as of 06/12/2022   No Known Allergies      Medication List     STOP taking these medications    dorzolamide-timolol 2-0.5 % ophthalmic solution Commonly known as: COSOPT   losartan 50 MG tablet Commonly known as: COZAAR   NovoLOG FlexPen 100 UNIT/ML FlexPen Generic drug: insulin aspart   Ventolin HFA 108 (90 Base) MCG/ACT inhaler Generic drug: albuterol       TAKE these medications    chlorthalidone 25 MG tablet Commonly known as: HYGROTON Take 25 mg by mouth daily.   clobetasol 0.05 % external solution Commonly known as: TEMOVATE Apply 1 application topically 2 (two) times daily.   cyanocobalamin 1000 MCG tablet Commonly known as: VITAMIN B12 Take 1,000 mcg by mouth daily.   diclofenac sodium 1 % Gel Commonly known as: VOLTAREN Apply 2 g topically 2 (two) times daily as needed (pain).   docusate sodium 100 MG capsule Commonly known as: COLACE Take 1 capsule (100 mg total) by mouth 2 (two) times daily.  DULoxetine 60 MG capsule Commonly known as: CYMBALTA Take 60 mg by mouth at bedtime.   Eliquis 5 MG Tabs tablet Generic drug: apixaban Take 5 mg by mouth 2 (two) times daily.   esomeprazole 40 MG capsule Commonly known as: NEXIUM Take 40 mg by mouth daily.   ferrous sulfate 325 (65 FE) MG tablet Take 325 mg by mouth daily  with breakfast.   fluticasone 50 MCG/ACT nasal spray Commonly known as: FLONASE Place 2 sprays into both nostrils daily as needed for allergies.   furosemide 20 MG tablet Commonly known as: LASIX TAKE 1 TABLET BY MOUTH DAILY   hydrOXYzine 25 MG tablet Commonly known as: ATARAX Take 25 mg by mouth at bedtime as needed (insomnia).   insulin lispro 100 UNIT/ML KwikPen Commonly known as: HUMALOG INJECT 0.1 ML (10 UNITS TOTAL) UNDER THESKIN 3 TIMES A DAY BEFORE MEALS What changed: See the new instructions.   isosorbide mononitrate 30 MG 24 hr tablet Commonly known as: IMDUR Take 1 tablet (30 mg total) by mouth daily.   Lantus SoloStar 100 UNIT/ML Solostar Pen Generic drug: insulin glargine INJECT 30 UNITS SUBCUTANEOUSLY AT BEDTIME. What changed: See the new instructions.   metFORMIN 500 MG tablet Commonly known as: GLUCOPHAGE Take 1,000 mg by mouth 2 (two) times daily.   metoprolol succinate 50 MG 24 hr tablet Commonly known as: Toprol XL Take 1 tablet (50 mg total) by mouth daily. Take with or immediately following a meal.   polyethylene glycol powder 17 GM/SCOOP powder Commonly known as: GLYCOLAX/MIRALAX 2 cap fulls in a full glass of water, two times a day, for 5 days.   potassium chloride SA 20 MEQ tablet Commonly known as: KLOR-CON M Take 20 mEq by mouth daily.   pravastatin 40 MG tablet Commonly known as: PRAVACHOL TAKE 1 TABLET BY MOUTH DAILY   prednisoLONE acetate 1 % ophthalmic suspension Commonly known as: PRED FORTE Place 1 drop into the right eye 2 (two) times daily.   traMADol 50 MG tablet Commonly known as: ULTRAM TAKE 1 TABLET BY MOUTH UP TO 3 TIMES DAILY AS NEEDED FOR PAIN What changed: See the new instructions.   UltiCare Short Pen Needles 31G X 8 MM Misc Generic drug: Insulin Pen Needle   Vitamin D3 125 MCG (5000 UT) Caps Take 5,000 Units by mouth daily.        No Known  Allergies  Consultations: Neurology  Procedures/Studies: ECHOCARDIOGRAM COMPLETE  Result Date: 06/12/2022    ECHOCARDIOGRAM REPORT   Patient Name:   Wanda Gardner Date of Exam: 06/11/2022 Medical Rec #:  [id number]       Height:       67.0 in Accession #:    [id number]      Weight:       204.5 lb Date of Birth:  Dec 08, 1940       BSA:          2.041 m Patient Age:    81 years        BP:           143/53 mmHg Patient Gender: F               HR:           75 bpm. Exam Location:  [facility] Procedure: 2D Echo Indications:     Stroke I63.9  History:         Patient has no prior history of Echocardiogram examinations.  Sonographer:     Overton Mam  RDCS, FASE Referring Phys:  [id number] AMY N COX Diagnosing Phys: Adrian Blackwater  Sonographer Comments: Suboptimal parasternal window. IMPRESSIONS  1. Left ventricular ejection fraction, by estimation, is 60 to 65%. The left ventricle has normal function. The left ventricle has no regional wall motion abnormalities. There is mild left ventricular hypertrophy. Left ventricular diastolic parameters are consistent with Grade I diastolic dysfunction (impaired relaxation).  2. Right ventricular systolic function is normal. The right ventricular size is normal.  3. The mitral valve is normal in structure. No evidence of mitral valve regurgitation. No evidence of mitral stenosis.  4. The aortic valve is normal in structure. Aortic valve regurgitation is not visualized. Aortic valve sclerosis/calcification is present, without any evidence of aortic stenosis.  5. The inferior vena cava is normal in size with greater than 50% respiratory variability, suggesting right atrial pressure of 3 mmHg. FINDINGS  Left Ventricle: Left ventricular ejection fraction, by estimation, is 60 to 65%. The left ventricle has normal function. The left ventricle has no regional wall motion abnormalities. The left ventricular internal cavity size was normal in size. There is  mild left ventricular  hypertrophy. Left ventricular diastolic parameters are consistent with Grade I diastolic dysfunction (impaired relaxation). Right Ventricle: The right ventricular size is normal. No increase in right ventricular wall thickness. Right ventricular systolic function is normal. Left Atrium: Left atrial size was normal in size. Right Atrium: Right atrial size was normal in size. Pericardium: There is no evidence of pericardial effusion. Mitral Valve: The mitral valve is normal in structure. There is mild thickening of the mitral valve leaflet(s). There is mild calcification of the mitral valve leaflet(s). Mild to moderate mitral annular calcification. No evidence of mitral valve regurgitation. No evidence of mitral valve stenosis. Tricuspid Valve: The tricuspid valve is normal in structure. Tricuspid valve regurgitation is trivial. No evidence of tricuspid stenosis. Aortic Valve: The aortic valve is normal in structure. Aortic valve regurgitation is not visualized. Aortic valve sclerosis/calcification is present, without any evidence of aortic stenosis. Aortic valve peak gradient measures 7.6 mmHg. Pulmonic Valve: The pulmonic valve was normal in structure. Pulmonic valve regurgitation is not visualized. No evidence of pulmonic stenosis. Aorta: The aortic root is normal in size and structure. Venous: The inferior vena cava is normal in size with greater than 50% respiratory variability, suggesting right atrial pressure of 3 mmHg. IAS/Shunts: No atrial level shunt detected by color flow Doppler.  LEFT VENTRICLE PLAX 2D LVIDd:         3.70 cm   Diastology LVIDs:         2.50 cm   LV e' medial:    8.92 cm/s LV PW:         1.40 cm   LV E/e' medial:  11.2 LV IVS:        1.30 cm   LV e' lateral:   12.50 cm/s LVOT diam:     2.00 cm   LV E/e' lateral: 8.0 LV SV:         90 LV SV Index:   44 LVOT Area:     3.14 cm  RIGHT VENTRICLE RV Basal diam:  2.80 cm RV S prime:     12.30 cm/s TAPSE (M-mode): 2.2 cm LEFT ATRIUM              Index        RIGHT ATRIUM          Index LA diam:        3.50  cm 1.71 cm/m   RA Area:     9.85 cm LA Vol (A2C):   50.8 ml 24.88 ml/m  RA Volume:   19.00 ml 9.31 ml/m LA Vol (A4C):   21.3 ml 10.43 ml/m LA Biplane Vol: 33.6 ml 16.46 ml/m  AORTIC VALVE                 PULMONIC VALVE AV Area (Vmax): 2.62 cm     PV Vmax:       1.03 m/s AV Vmax:        138.00 cm/s  PV Peak grad:  4.2 mmHg AV Peak Grad:   7.6 mmHg LVOT Vmax:      115.00 cm/s LVOT Vmean:     77.000 cm/s LVOT VTI:       0.286 m  AORTA Ao Root diam: 3.20 cm MITRAL VALVE                TRICUSPID VALVE MV Area (PHT): 3.53 cm     TR Peak grad:   41.2 mmHg MV Decel Time: 215 msec     TR Vmax:        321.00 cm/s MV E velocity: 100.00 cm/s MV A velocity: 143.00 cm/s  SHUNTS MV E/A ratio:  0.70         Systemic VTI:  0.29 m                             Systemic Diam: 2.00 cm Adrian Blackwater Electronically signed by Adrian Blackwater Signature Date/Time: 06/12/2022/8:23:57 AM    Final    MR BRAIN WO CONTRAST  Result Date: 06/10/2022 CLINICAL DATA:  Stroke follow-up EXAM: MRI HEAD WITHOUT CONTRAST TECHNIQUE: Multiplanar, multiecho pulse sequences of the brain and surrounding structures were obtained without intravenous contrast. COMPARISON:  Same day CTA head and neck. FINDINGS: Brain: Negative for an acute infarct. No hemorrhage. No extra-axial fluid collection. Sequela moderate chronic microvascular ischemic change. No mass effect. There is likely an anterior temporal encephalocele (series 17, image 22) on the left. Vascular: Normal flow voids. Skull and upper cervical spine: Likely moderate spinal canal stenosis at C3-C4 Sinuses/Orbits: Bilateral lens replacement. No mastoid or middle ear effusion. Paranasal sinuses are clear. Other: None. IMPRESSION: 1. No acute intracranial process. 2. Left anterior temporal encephalocele Electronically Signed   By: Lorenza Cambridge M.D.   On: 06/10/2022 17:54   CT Angio Chest/Abd/Pel for Dissection W and/or Wo  Contrast  Result Date: 06/10/2022 CLINICAL DATA:  Acute aortic syndrome (AAS) suspected R sided weakness and slurred speech, abd pain/chest pain and hypotension, AC EXAM: CT ANGIOGRAPHY CHEST, ABDOMEN AND PELVIS TECHNIQUE: Non-contrast CT of the chest was initially obtained. Multidetector CT imaging through the chest, abdomen and pelvis was performed using the standard protocol during bolus administration of intravenous contrast. Multiplanar reconstructed images and MIPs were obtained and reviewed to evaluate the vascular anatomy. RADIATION DOSE REDUCTION: This exam was performed according to the departmental dose-optimization program which includes automated exposure control, adjustment of the mA and/or kV according to patient size and/or use of iterative reconstruction technique. CONTRAST:  80mL OMNIPAQUE IOHEXOL 350 MG/ML SOLN COMPARISON:  06/16/2016 FINDINGS: CTA CHEST FINDINGS Cardiovascular: SVC patent. Heart size normal. No pericardial effusion. The RV is nondilated. Fair contrast opacification of the pulmonary arterial tree, with no convincing filling defects to suggest acute PE. Mitral annulus calcifications. Scattered 3-vessel coronary calcifications. Atheromatous thoracic aorta without dissection or stenosis. Mediastinum/Nodes: No mediastinal hematoma,  mass or adenopathy. Lungs/Pleura: No pleural effusion. No pneumothorax. Dependent atelectasis posteriorly. Lungs otherwise clear. Musculoskeletal: Vertebral endplate spurring at multiple levels in the lower thoracic spine. Review of the MIP images confirms the above findings. CTA ABDOMEN AND PELVIS FINDINGS VASCULAR Aorta: Extensive calcified atheromatous plaque without aneurysm, dissection, or stenosis. Celiac: Calcified ostial plaque resulting in short segment stenosis of possible hemodynamic significance, patent distally. SMA: Calcified ostial plaque resulting in short segment stenosis of at least moderate severity, atheromatous but patent distally.  Renals: Single left, with calcified ostial plaque, no significant stenosis. Single right, with calcified plaque resulting in short segment stenosis of at least mild severity, patent distally. IMA: Patent without evidence of aneurysm, dissection, vasculitis or significant stenosis. Inflow: Extensive calcified iliac arterial plaque without aneurysm, resulting in areas of at least mild stenosis bilaterally. Veins: No obvious venous abnormality within the limitations of this arterial phase study. Review of the MIP images confirms the above findings. NON-VASCULAR Hepatobiliary: Post cholecystectomy with distended central intrahepatic and extrahepatic biliary tree as before. No focal liver lesion. Pancreas: Unremarkable. No pancreatic ductal dilatation or surrounding inflammatory changes. Spleen: Normal in size without focal abnormality. Adrenals/Urinary Tract: 12 mm 25 HU right adrenal nodule stable since 2018 consistent with benign adenoma; no follow-up recommended. No hydronephrosis. Multiple cortical lesions are again seen throughout both kidneys, some of which can be characterized as cysts, largest left upper pole 5.3 cm 9 HU; no follow-up recommended. Urinary bladder physiologically distended. Stomach/Bowel: Stomach is partially distended, unremarkable. Small bowel decompressed. Normal appendix. The colon is partially distended by gas and fecal material. A few descending segment diverticula without adjacent inflammatory change. Mild fecal distention of the rectum. Lymphatic: No abdominal or pelvic adenopathy. Reproductive: Status post hysterectomy. No adnexal masses. Other: No ascites.  No free air. Musculoskeletal: Small paraumbilical hernia containing only mesenteric fat. Postop changes in the anterior pelvic body wall. Surgical clips in the pelvis. Right hip arthroplasty components in expected location, resulting in significant regional streak artifact. Multilevel spondylitic change in the lower lumbar spine.  Review of the MIP images confirms the above findings. IMPRESSION: 1. Negative for acute PE or thoracic aortic dissection. 2. Coronary and aortic atherosclerosis (ICD10-I70.0). 3. Descending segment colonic diverticulosis. 4. Small paraumbilical hernia containing only mesenteric fat. Electronically Signed   By: Corlis Leak M.D.   On: 06/10/2022 12:37   CT Angio Head Neck W WO CM  Result Date: 06/10/2022 CLINICAL DATA:  Stroke suspected EXAM: CT ANGIOGRAPHY HEAD AND NECK WITH AND WITHOUT CONTRAST TECHNIQUE: Multidetector CT imaging of the head and neck was performed using the standard protocol during bolus administration of intravenous contrast. Multiplanar CT image reconstructions and MIPs were obtained to evaluate the vascular anatomy. Carotid stenosis measurements (when applicable) are obtained utilizing NASCET criteria, using the distal internal carotid diameter as the denominator. RADIATION DOSE REDUCTION: This exam was performed according to the departmental dose-optimization program which includes automated exposure control, adjustment of the mA and/or kV according to patient size and/or use of iterative reconstruction technique. CONTRAST:  80mL OMNIPAQUE IOHEXOL 350 MG/ML SOLN COMPARISON:  None Available. FINDINGS: CT HEAD FINDINGS See same day CT head for intracranial findings. CTA NECK FINDINGS Aortic arch: Two-vessel arch. Imaged portion shows no evidence of aneurysm or dissection. No significant stenosis of the major arch vessel origins. Right carotid system: No evidence of dissection, stenosis (50% or greater), or occlusion. Left carotid system: No evidence of dissection, stenosis (50% or greater), or occlusion. Vertebral arteries: Left dominant. There is mild narrowing  of the origin of the left vertebral artery the V4 segment of the right vertebral artery is diminutive in the right vertebral artery predominantly terminates as a PICA. The left PICA is not definitively visualized. No evidence of  dissection, stenosis (50% or greater), or occlusion. Skeleton: Negative. Other neck: 1.5 cm left thyroid nodule. Recommend further evaluation with a dedicated thyroid ultrasound, if not previously performed Upper chest: See same day CT chest abdomen pelvis for thoracic findings, including a ground-glass nodule along the posterior aspect of the right upper lobe. Aortic atherosclerotic calcifications. Review of the MIP images confirms the above findings CTA HEAD FINDINGS Anterior circulation: There is moderate narrowing in the cavernous segment of the right ICA. Posterior circulation: No significant stenosis, proximal occlusion, aneurysm, or vascular malformation. Venous sinuses: As permitted by contrast timing, patent. Anatomic variants: Bilateral A2 segments arise from the left A1 Review of the MIP images confirms the above findings IMPRESSION: 1. Left PICA is not visualized. This is age indeterminate. Otherwise no intracranial large vessel occlusion. Moderate narrowing in the cavernous segment of the right ICA. 2. No hemodynamically significant stenosis in the neck. 3. 1.5 cm left thyroid nodule. Recommend further evaluation with a dedicated thyroid ultrasound, if not previously performed. Aortic Atherosclerosis (ICD10-I70.0). Electronically Signed   By: Lorenza Cambridge M.D.   On: 06/10/2022 12:30   CT HEAD WO CONTRAST  Result Date: 06/10/2022 CLINICAL DATA:  82 year old female last known well 0200 hours. Found down. Hypertensive now. EXAM: CT HEAD WITHOUT CONTRAST TECHNIQUE: Contiguous axial images were obtained from the base of the skull through the vertex without intravenous contrast. RADIATION DOSE REDUCTION: This exam was performed according to the departmental dose-optimization program which includes automated exposure control, adjustment of the mA and/or kV according to patient size and/or use of iterative reconstruction technique. COMPARISON:  None Available. FINDINGS: Brain: Cerebral volume is within  normal limits for age. No midline shift, ventriculomegaly, mass effect, evidence of mass lesion, intracranial hemorrhage or evidence of cortically based acute infarction. Gray-white matter differentiation is within normal limits throughout the brain. Scattered, patchy bilateral cerebral white matter hypodensity. Some deep white matter capsule involvement. Deep gray nuclei relatively spared. No cortical encephalomalacia identified. ASPECTS 10. Vascular: Calcified atherosclerosis at the skull base. No suspicious intracranial vascular hyperdensity. Skull: No acute osseous abnormality identified. Sinuses/Orbits: Visualized paranasal sinuses and mastoids are well aerated. Other: No acute orbit or scalp soft tissue finding. IMPRESSION: 1. No acute intracranial abnormality or acute traumatic injury identified. 2. Mild to moderate for age cerebral white matter changes, most commonly due to small vessel disease. Electronically Signed   By: Odessa Fleming M.D.   On: 06/10/2022 11:09   DG Chest Port 1 View  Result Date: 06/10/2022 CLINICAL DATA:  Questionable sepsis. EXAM: PORTABLE CHEST 1 VIEW COMPARISON:  One-view chest x-ray 26/16 FINDINGS: Heart size is exaggerated by low lung volumes. Central airway scratched at moderate central pulmonary vascular congestion noted. Mild bibasilar airspace disease likely reflects atelectasis. The upper lung fields are clear. The visualized soft tissues and bony thorax are unremarkable. IMPRESSION: 1. Low lung volumes. 2. Moderate central pulmonary vascular congestion. 3. Mild bibasilar airspace disease likely reflects atelectasis. Electronically Signed   By: Marin Roberts M.D.   On: 06/10/2022 11:02      Subjective: Seen and examined on the day of discharge.  Stable no distress.  Appropriate for discharge home.  Discharge Exam: Vitals:   06/12/22 0729 06/12/22 0754  BP: (!) 154/54 (!) 116/58  Pulse: 66  Resp: 20   Temp: 98.8 F (37.1 C)   SpO2: 97%    Vitals:    06/11/22 2226 06/12/22 0543 06/12/22 0729 06/12/22 0754  BP: 132/60 (!) 126/49 (!) 154/54 (!) 116/58  Pulse: 61 62 66   Resp: 19 20 20    Temp: 98.7 F (37.1 C) 98.6 F (37 C) 98.8 F (37.1 C)   TempSrc:   Oral   SpO2: 100% 100% 97%   Weight:        General: Pt is alert, awake, not in acute distress Cardiovascular: RRR, S1/S2 +, no rubs, no gallops Respiratory: CTA bilaterally, no wheezing, no rhonchi Abdominal: Soft, NT, ND, bowel sounds + Extremities: no edema, no cyanosis    The results of significant diagnostics from this hospitalization (including imaging, microbiology, ancillary and laboratory) are listed below for reference.     Microbiology: Recent Results (from the past 240 hour(s))  Blood Culture (routine x 2)     Status: None (Preliminary result)   Collection Time: 06/10/22  7:49 PM   Specimen: BLOOD  Result Value Ref Range Status   Specimen Description BLOOD BLOOD RIGHT HAND  Final   Special Requests IN PEDIATRIC BOTTLE Blood Culture adequate volume  Final   Culture   Final    NO GROWTH < 12 HOURS Performed at [facility], [address] [place], [place] [address]    Report Status PENDING  Incomplete     Labs: BNP (last 3 results) No results for input(s): "BNP" in the last 8760 hours. Basic Metabolic Panel: Recent Labs  Lab 06/10/22 1031 06/11/22 0651 06/12/22 0549  NA 136 138 140  K 3.8 3.6 3.6  CL 100 103 104  CO2 26 25 28   GLUCOSE 202* 163* 128*  BUN 36* 26* 16  CREATININE 1.65* 1.10* 0.84  CALCIUM 8.4* 8.9 9.3   Liver Function Tests: Recent Labs  Lab 06/10/22 1031  AST 21  ALT 11  ALKPHOS 73  BILITOT 0.6  PROT 6.2*  ALBUMIN 3.3*   No results for input(s): "LIPASE", "AMYLASE" in the last 168 hours. No results for input(s): "AMMONIA" in the last 168 hours. CBC: Recent Labs  Lab 06/10/22 1031 06/11/22 0651 06/12/22 0549  WBC 6.5 5.4 4.4  NEUTROABS 5.0 3.8 2.5  HGB 9.9* 11.2* 10.8*  HCT 31.4* 35.2* 35.0*  MCV 87.0  86.1 87.9  PLT 338 376 309   Cardiac Enzymes: No results for input(s): "CKTOTAL", "CKMB", "CKMBINDEX", "TROPONINI" in the last 168 hours. BNP: Invalid input(s): "POCBNP" CBG: Recent Labs  Lab 06/11/22 0816 06/11/22 1212 06/11/22 1559 06/11/22 2223 06/12/22 0759  GLUCAP 146* 208* 235* 157* 114*   D-Dimer No results for input(s): "DDIMER" in the last 72 hours. Hgb A1c Recent Labs    06/11/22 0651  HGBA1C 7.6*   Lipid Profile Recent Labs    06/11/22 0651  CHOL 132  HDL 39*  LDLCALC 73  TRIG 604  CHOLHDL 3.4   Thyroid function studies No results for input(s): "TSH", "T4TOTAL", "T3FREE", "THYROIDAB" in the last 72 hours.  Invalid input(s): "FREET3" Anemia work up No results for input(s): "VITAMINB12", "FOLATE", "FERRITIN", "TIBC", "IRON", "RETICCTPCT" in the last 72 hours. Urinalysis    Component Value Date/Time   COLORURINE YELLOW (A) 06/10/2022 1048   APPEARANCEUR HAZY (A) 06/10/2022 1048   APPEARANCEUR Clear 08/19/2014 1515   LABSPEC 1.018 06/10/2022 1048   PHURINE 5.0 06/10/2022 1048   GLUCOSEU NEGATIVE 06/10/2022 1048   HGBUR NEGATIVE 06/10/2022 1048   BILIRUBINUR NEGATIVE 06/10/2022  1048   BILIRUBINUR Negative 08/19/2014 1515   KETONESUR NEGATIVE 06/10/2022 1048   PROTEINUR NEGATIVE 06/10/2022 1048   NITRITE NEGATIVE 06/10/2022 1048   LEUKOCYTESUR LARGE (A) 06/10/2022 1048   Sepsis Labs Recent Labs  Lab 06/10/22 1031 06/11/22 0651 06/12/22 0549  WBC 6.5 5.4 4.4   Microbiology Recent Results (from the past 240 hour(s))  Blood Culture (routine x 2)     Status: None (Preliminary result)   Collection Time: 06/10/22  7:49 PM   Specimen: BLOOD  Result Value Ref Range Status   Specimen Description BLOOD BLOOD RIGHT HAND  Final   Special Requests IN PEDIATRIC BOTTLE Blood Culture adequate volume  Final   Culture   Final    NO GROWTH < 12 HOURS Performed at [facility], [address] [place], [place] [address]    Report Status PENDING   Incomplete     Time coordinating discharge: Over 30 minutes  SIGNED:   Tresa Moore, MD  [facility] 06/12/2022, 11:42 AM Pager   If 7PM-7AM, please contact night-coverage

## 2022-06-12 NOTE — TOC Transition Note (Signed)
Transition of Care [facility]) - CM/SW Discharge Note   Patient Details  Name: Wanda Gardner MRN: [id number] Date of Birth: October 11, 1940  Transition of Care [facility]) CM/SW Contact:  Allena Katz, LCSW Phone Number: 06/12/2022, 10:21 AM   Clinical Narrative:  Pt discharging home with home health. PT/OT/RN. Pt reports no preference. Referral given to [facility] with [facility]. CSW signing off.           Patient Goals and CMS Choice      Discharge Placement                         Discharge Plan and Services Additional resources added to the After Visit Summary for                                       Social Determinants of Health (SDOH) Interventions SDOH Screenings   Food Insecurity: Food Insecurity Present (06/10/2022)  Housing: Low Risk  (06/10/2022)  Transportation Needs: Unmet Transportation Needs (06/10/2022)  Utilities: At Risk (06/10/2022)  Tobacco Use: High Risk (06/10/2022)     Readmission Risk Interventions     No data to display

## 2022-06-13 LAB — CULTURE, BLOOD (ROUTINE X 2)
Culture: NO GROWTH
Special Requests: ADEQUATE

## 2022-06-14 DIAGNOSIS — F419 Anxiety disorder, unspecified: Secondary | ICD-10-CM | POA: Diagnosis not present

## 2022-06-14 DIAGNOSIS — E1165 Type 2 diabetes mellitus with hyperglycemia: Secondary | ICD-10-CM | POA: Diagnosis not present

## 2022-06-14 DIAGNOSIS — N179 Acute kidney failure, unspecified: Secondary | ICD-10-CM | POA: Diagnosis not present

## 2022-06-14 DIAGNOSIS — E113513 Type 2 diabetes mellitus with proliferative diabetic retinopathy with macular edema, bilateral: Secondary | ICD-10-CM | POA: Diagnosis not present

## 2022-06-14 DIAGNOSIS — I2510 Atherosclerotic heart disease of native coronary artery without angina pectoris: Secondary | ICD-10-CM | POA: Diagnosis not present

## 2022-06-14 DIAGNOSIS — I10 Essential (primary) hypertension: Secondary | ICD-10-CM | POA: Diagnosis not present

## 2022-06-14 DIAGNOSIS — F32A Depression, unspecified: Secondary | ICD-10-CM | POA: Diagnosis not present

## 2022-06-14 DIAGNOSIS — E785 Hyperlipidemia, unspecified: Secondary | ICD-10-CM | POA: Diagnosis not present

## 2022-06-14 DIAGNOSIS — J4489 Other specified chronic obstructive pulmonary disease: Secondary | ICD-10-CM | POA: Diagnosis not present

## 2022-06-14 LAB — CULTURE, BLOOD (ROUTINE X 2)

## 2022-06-15 LAB — CULTURE, BLOOD (ROUTINE X 2): Culture: NO GROWTH

## 2022-06-16 ENCOUNTER — Other Ambulatory Visit: Payer: Self-pay | Admitting: Family

## 2022-06-16 DIAGNOSIS — E1165 Type 2 diabetes mellitus with hyperglycemia: Secondary | ICD-10-CM | POA: Diagnosis not present

## 2022-06-16 DIAGNOSIS — I10 Essential (primary) hypertension: Secondary | ICD-10-CM | POA: Diagnosis not present

## 2022-06-16 DIAGNOSIS — I2510 Atherosclerotic heart disease of native coronary artery without angina pectoris: Secondary | ICD-10-CM | POA: Diagnosis not present

## 2022-06-16 DIAGNOSIS — F32A Depression, unspecified: Secondary | ICD-10-CM | POA: Diagnosis not present

## 2022-06-16 DIAGNOSIS — J4489 Other specified chronic obstructive pulmonary disease: Secondary | ICD-10-CM | POA: Diagnosis not present

## 2022-06-16 DIAGNOSIS — N179 Acute kidney failure, unspecified: Secondary | ICD-10-CM | POA: Diagnosis not present

## 2022-06-16 DIAGNOSIS — E785 Hyperlipidemia, unspecified: Secondary | ICD-10-CM | POA: Diagnosis not present

## 2022-06-16 DIAGNOSIS — E113513 Type 2 diabetes mellitus with proliferative diabetic retinopathy with macular edema, bilateral: Secondary | ICD-10-CM | POA: Diagnosis not present

## 2022-06-16 DIAGNOSIS — F419 Anxiety disorder, unspecified: Secondary | ICD-10-CM | POA: Diagnosis not present

## 2022-06-16 LAB — CULTURE, BLOOD (ROUTINE X 2): Special Requests: ADEQUATE

## 2022-06-23 DIAGNOSIS — N179 Acute kidney failure, unspecified: Secondary | ICD-10-CM | POA: Diagnosis not present

## 2022-06-23 DIAGNOSIS — E1165 Type 2 diabetes mellitus with hyperglycemia: Secondary | ICD-10-CM | POA: Diagnosis not present

## 2022-06-23 DIAGNOSIS — J4489 Other specified chronic obstructive pulmonary disease: Secondary | ICD-10-CM | POA: Diagnosis not present

## 2022-06-23 DIAGNOSIS — E113513 Type 2 diabetes mellitus with proliferative diabetic retinopathy with macular edema, bilateral: Secondary | ICD-10-CM | POA: Diagnosis not present

## 2022-06-23 DIAGNOSIS — I2510 Atherosclerotic heart disease of native coronary artery without angina pectoris: Secondary | ICD-10-CM | POA: Diagnosis not present

## 2022-06-23 DIAGNOSIS — E785 Hyperlipidemia, unspecified: Secondary | ICD-10-CM | POA: Diagnosis not present

## 2022-06-23 DIAGNOSIS — F32A Depression, unspecified: Secondary | ICD-10-CM | POA: Diagnosis not present

## 2022-06-23 DIAGNOSIS — F419 Anxiety disorder, unspecified: Secondary | ICD-10-CM | POA: Diagnosis not present

## 2022-06-23 DIAGNOSIS — I10 Essential (primary) hypertension: Secondary | ICD-10-CM | POA: Diagnosis not present

## 2022-06-26 DIAGNOSIS — R32 Unspecified urinary incontinence: Secondary | ICD-10-CM | POA: Diagnosis not present

## 2022-06-26 DIAGNOSIS — N3942 Incontinence without sensory awareness: Secondary | ICD-10-CM | POA: Diagnosis not present

## 2022-06-27 ENCOUNTER — Telehealth: Payer: Self-pay | Admitting: Family

## 2022-06-27 DIAGNOSIS — F419 Anxiety disorder, unspecified: Secondary | ICD-10-CM | POA: Diagnosis not present

## 2022-06-27 DIAGNOSIS — I2510 Atherosclerotic heart disease of native coronary artery without angina pectoris: Secondary | ICD-10-CM | POA: Diagnosis not present

## 2022-06-27 DIAGNOSIS — J4489 Other specified chronic obstructive pulmonary disease: Secondary | ICD-10-CM | POA: Diagnosis not present

## 2022-06-27 DIAGNOSIS — E1165 Type 2 diabetes mellitus with hyperglycemia: Secondary | ICD-10-CM | POA: Diagnosis not present

## 2022-06-27 DIAGNOSIS — E785 Hyperlipidemia, unspecified: Secondary | ICD-10-CM | POA: Diagnosis not present

## 2022-06-27 DIAGNOSIS — E113513 Type 2 diabetes mellitus with proliferative diabetic retinopathy with macular edema, bilateral: Secondary | ICD-10-CM | POA: Diagnosis not present

## 2022-06-27 DIAGNOSIS — N179 Acute kidney failure, unspecified: Secondary | ICD-10-CM | POA: Diagnosis not present

## 2022-06-27 DIAGNOSIS — F32A Depression, unspecified: Secondary | ICD-10-CM | POA: Diagnosis not present

## 2022-06-27 DIAGNOSIS — I10 Essential (primary) hypertension: Secondary | ICD-10-CM | POA: Diagnosis not present

## 2022-06-27 NOTE — Telephone Encounter (Signed)
Marlaine Hind with [facility] called and is doing PT with patient and states that the patient told her today that she is having trouble sleeping. Patient is falling asleep during the day because she isn't sleeping well overnight. Would you like to send her in something? She has been taking something OTC but can't remember what it is called and it is not helping. Or would you like for her to come in for an appt?

## 2022-06-30 DIAGNOSIS — E113513 Type 2 diabetes mellitus with proliferative diabetic retinopathy with macular edema, bilateral: Secondary | ICD-10-CM | POA: Diagnosis not present

## 2022-06-30 DIAGNOSIS — N179 Acute kidney failure, unspecified: Secondary | ICD-10-CM | POA: Diagnosis not present

## 2022-06-30 DIAGNOSIS — I10 Essential (primary) hypertension: Secondary | ICD-10-CM | POA: Diagnosis not present

## 2022-06-30 DIAGNOSIS — F419 Anxiety disorder, unspecified: Secondary | ICD-10-CM | POA: Diagnosis not present

## 2022-06-30 DIAGNOSIS — F32A Depression, unspecified: Secondary | ICD-10-CM | POA: Diagnosis not present

## 2022-06-30 DIAGNOSIS — E1165 Type 2 diabetes mellitus with hyperglycemia: Secondary | ICD-10-CM | POA: Diagnosis not present

## 2022-06-30 DIAGNOSIS — E785 Hyperlipidemia, unspecified: Secondary | ICD-10-CM | POA: Diagnosis not present

## 2022-06-30 DIAGNOSIS — J4489 Other specified chronic obstructive pulmonary disease: Secondary | ICD-10-CM | POA: Diagnosis not present

## 2022-06-30 DIAGNOSIS — I2510 Atherosclerotic heart disease of native coronary artery without angina pectoris: Secondary | ICD-10-CM | POA: Diagnosis not present

## 2022-07-03 DIAGNOSIS — J4489 Other specified chronic obstructive pulmonary disease: Secondary | ICD-10-CM | POA: Diagnosis not present

## 2022-07-03 DIAGNOSIS — E113513 Type 2 diabetes mellitus with proliferative diabetic retinopathy with macular edema, bilateral: Secondary | ICD-10-CM | POA: Diagnosis not present

## 2022-07-03 DIAGNOSIS — I2510 Atherosclerotic heart disease of native coronary artery without angina pectoris: Secondary | ICD-10-CM | POA: Diagnosis not present

## 2022-07-03 DIAGNOSIS — F419 Anxiety disorder, unspecified: Secondary | ICD-10-CM | POA: Diagnosis not present

## 2022-07-03 DIAGNOSIS — E785 Hyperlipidemia, unspecified: Secondary | ICD-10-CM | POA: Diagnosis not present

## 2022-07-03 DIAGNOSIS — N179 Acute kidney failure, unspecified: Secondary | ICD-10-CM | POA: Diagnosis not present

## 2022-07-03 DIAGNOSIS — I10 Essential (primary) hypertension: Secondary | ICD-10-CM | POA: Diagnosis not present

## 2022-07-03 DIAGNOSIS — F32A Depression, unspecified: Secondary | ICD-10-CM | POA: Diagnosis not present

## 2022-07-03 DIAGNOSIS — E1165 Type 2 diabetes mellitus with hyperglycemia: Secondary | ICD-10-CM | POA: Diagnosis not present

## 2022-07-05 DIAGNOSIS — E113513 Type 2 diabetes mellitus with proliferative diabetic retinopathy with macular edema, bilateral: Secondary | ICD-10-CM | POA: Diagnosis not present

## 2022-07-05 DIAGNOSIS — F32A Depression, unspecified: Secondary | ICD-10-CM | POA: Diagnosis not present

## 2022-07-05 DIAGNOSIS — I10 Essential (primary) hypertension: Secondary | ICD-10-CM | POA: Diagnosis not present

## 2022-07-05 DIAGNOSIS — F419 Anxiety disorder, unspecified: Secondary | ICD-10-CM | POA: Diagnosis not present

## 2022-07-05 DIAGNOSIS — N179 Acute kidney failure, unspecified: Secondary | ICD-10-CM | POA: Diagnosis not present

## 2022-07-05 DIAGNOSIS — J4489 Other specified chronic obstructive pulmonary disease: Secondary | ICD-10-CM | POA: Diagnosis not present

## 2022-07-05 DIAGNOSIS — I2510 Atherosclerotic heart disease of native coronary artery without angina pectoris: Secondary | ICD-10-CM | POA: Diagnosis not present

## 2022-07-05 DIAGNOSIS — E1165 Type 2 diabetes mellitus with hyperglycemia: Secondary | ICD-10-CM | POA: Diagnosis not present

## 2022-07-05 DIAGNOSIS — E785 Hyperlipidemia, unspecified: Secondary | ICD-10-CM | POA: Diagnosis not present

## 2022-07-10 ENCOUNTER — Other Ambulatory Visit: Payer: Self-pay | Admitting: Family

## 2022-07-11 DIAGNOSIS — I10 Essential (primary) hypertension: Secondary | ICD-10-CM | POA: Diagnosis not present

## 2022-07-11 DIAGNOSIS — F32A Depression, unspecified: Secondary | ICD-10-CM | POA: Diagnosis not present

## 2022-07-11 DIAGNOSIS — E113513 Type 2 diabetes mellitus with proliferative diabetic retinopathy with macular edema, bilateral: Secondary | ICD-10-CM | POA: Diagnosis not present

## 2022-07-11 DIAGNOSIS — E1165 Type 2 diabetes mellitus with hyperglycemia: Secondary | ICD-10-CM | POA: Diagnosis not present

## 2022-07-11 DIAGNOSIS — N179 Acute kidney failure, unspecified: Secondary | ICD-10-CM | POA: Diagnosis not present

## 2022-07-11 DIAGNOSIS — I2510 Atherosclerotic heart disease of native coronary artery without angina pectoris: Secondary | ICD-10-CM | POA: Diagnosis not present

## 2022-07-11 DIAGNOSIS — F419 Anxiety disorder, unspecified: Secondary | ICD-10-CM | POA: Diagnosis not present

## 2022-07-11 DIAGNOSIS — E785 Hyperlipidemia, unspecified: Secondary | ICD-10-CM | POA: Diagnosis not present

## 2022-07-11 DIAGNOSIS — J4489 Other specified chronic obstructive pulmonary disease: Secondary | ICD-10-CM | POA: Diagnosis not present

## 2022-07-19 DIAGNOSIS — N179 Acute kidney failure, unspecified: Secondary | ICD-10-CM | POA: Diagnosis not present

## 2022-07-19 DIAGNOSIS — J4489 Other specified chronic obstructive pulmonary disease: Secondary | ICD-10-CM | POA: Diagnosis not present

## 2022-07-19 DIAGNOSIS — I2510 Atherosclerotic heart disease of native coronary artery without angina pectoris: Secondary | ICD-10-CM | POA: Diagnosis not present

## 2022-07-19 DIAGNOSIS — I10 Essential (primary) hypertension: Secondary | ICD-10-CM | POA: Diagnosis not present

## 2022-07-19 DIAGNOSIS — E113513 Type 2 diabetes mellitus with proliferative diabetic retinopathy with macular edema, bilateral: Secondary | ICD-10-CM | POA: Diagnosis not present

## 2022-07-19 DIAGNOSIS — E785 Hyperlipidemia, unspecified: Secondary | ICD-10-CM | POA: Diagnosis not present

## 2022-07-19 DIAGNOSIS — F419 Anxiety disorder, unspecified: Secondary | ICD-10-CM | POA: Diagnosis not present

## 2022-07-19 DIAGNOSIS — E1165 Type 2 diabetes mellitus with hyperglycemia: Secondary | ICD-10-CM | POA: Diagnosis not present

## 2022-07-19 DIAGNOSIS — F32A Depression, unspecified: Secondary | ICD-10-CM | POA: Diagnosis not present

## 2022-07-25 ENCOUNTER — Other Ambulatory Visit: Payer: Self-pay | Admitting: Family

## 2022-07-25 ENCOUNTER — Other Ambulatory Visit: Payer: Self-pay | Admitting: Internal Medicine

## 2022-07-25 DIAGNOSIS — I10 Essential (primary) hypertension: Secondary | ICD-10-CM | POA: Diagnosis not present

## 2022-07-25 DIAGNOSIS — F419 Anxiety disorder, unspecified: Secondary | ICD-10-CM | POA: Diagnosis not present

## 2022-07-25 DIAGNOSIS — E1165 Type 2 diabetes mellitus with hyperglycemia: Secondary | ICD-10-CM | POA: Diagnosis not present

## 2022-07-25 DIAGNOSIS — I2510 Atherosclerotic heart disease of native coronary artery without angina pectoris: Secondary | ICD-10-CM | POA: Diagnosis not present

## 2022-07-25 DIAGNOSIS — E785 Hyperlipidemia, unspecified: Secondary | ICD-10-CM | POA: Diagnosis not present

## 2022-07-25 DIAGNOSIS — E113513 Type 2 diabetes mellitus with proliferative diabetic retinopathy with macular edema, bilateral: Secondary | ICD-10-CM | POA: Diagnosis not present

## 2022-07-25 DIAGNOSIS — F32A Depression, unspecified: Secondary | ICD-10-CM | POA: Diagnosis not present

## 2022-07-25 DIAGNOSIS — J4489 Other specified chronic obstructive pulmonary disease: Secondary | ICD-10-CM | POA: Diagnosis not present

## 2022-07-25 DIAGNOSIS — N179 Acute kidney failure, unspecified: Secondary | ICD-10-CM | POA: Diagnosis not present

## 2022-07-27 DIAGNOSIS — R32 Unspecified urinary incontinence: Secondary | ICD-10-CM | POA: Diagnosis not present

## 2022-07-27 DIAGNOSIS — N3942 Incontinence without sensory awareness: Secondary | ICD-10-CM | POA: Diagnosis not present

## 2022-08-01 DIAGNOSIS — E785 Hyperlipidemia, unspecified: Secondary | ICD-10-CM | POA: Diagnosis not present

## 2022-08-01 DIAGNOSIS — N179 Acute kidney failure, unspecified: Secondary | ICD-10-CM | POA: Diagnosis not present

## 2022-08-01 DIAGNOSIS — I10 Essential (primary) hypertension: Secondary | ICD-10-CM | POA: Diagnosis not present

## 2022-08-01 DIAGNOSIS — I2510 Atherosclerotic heart disease of native coronary artery without angina pectoris: Secondary | ICD-10-CM | POA: Diagnosis not present

## 2022-08-01 DIAGNOSIS — F32A Depression, unspecified: Secondary | ICD-10-CM | POA: Diagnosis not present

## 2022-08-01 DIAGNOSIS — F419 Anxiety disorder, unspecified: Secondary | ICD-10-CM | POA: Diagnosis not present

## 2022-08-01 DIAGNOSIS — E1165 Type 2 diabetes mellitus with hyperglycemia: Secondary | ICD-10-CM | POA: Diagnosis not present

## 2022-08-01 DIAGNOSIS — E113513 Type 2 diabetes mellitus with proliferative diabetic retinopathy with macular edema, bilateral: Secondary | ICD-10-CM | POA: Diagnosis not present

## 2022-08-01 DIAGNOSIS — J4489 Other specified chronic obstructive pulmonary disease: Secondary | ICD-10-CM | POA: Diagnosis not present

## 2022-08-09 ENCOUNTER — Other Ambulatory Visit: Payer: Self-pay | Admitting: Family

## 2022-08-10 MED ORDER — DOXEPIN HCL 10 MG PO CAPS: 10.0000 mg | ORAL_CAPSULE | Freq: Every day | ORAL | 0 refills | Status: DC

## 2022-08-10 NOTE — Addendum Note (Signed)
Addended by: Grayling Congress on: 08/10/2022 12:52 PM   Modules accepted: Orders

## 2022-08-22 DIAGNOSIS — H1811 Bullous keratopathy, right eye: Secondary | ICD-10-CM | POA: Diagnosis not present

## 2022-08-22 DIAGNOSIS — H401121 Primary open-angle glaucoma, left eye, mild stage: Secondary | ICD-10-CM | POA: Diagnosis not present

## 2022-08-22 DIAGNOSIS — H4051X3 Glaucoma secondary to other eye disorders, right eye, severe stage: Secondary | ICD-10-CM | POA: Diagnosis not present

## 2022-08-28 DIAGNOSIS — N3942 Incontinence without sensory awareness: Secondary | ICD-10-CM | POA: Diagnosis not present

## 2022-08-28 DIAGNOSIS — R32 Unspecified urinary incontinence: Secondary | ICD-10-CM | POA: Diagnosis not present

## 2022-09-14 ENCOUNTER — Other Ambulatory Visit: Payer: Self-pay | Admitting: Family

## 2022-09-28 DIAGNOSIS — N3942 Incontinence without sensory awareness: Secondary | ICD-10-CM | POA: Diagnosis not present

## 2022-09-28 DIAGNOSIS — R32 Unspecified urinary incontinence: Secondary | ICD-10-CM | POA: Diagnosis not present

## 2022-10-09 DIAGNOSIS — L298 Other pruritus: Secondary | ICD-10-CM | POA: Diagnosis not present

## 2022-10-09 DIAGNOSIS — L668 Other cicatricial alopecia: Secondary | ICD-10-CM | POA: Diagnosis not present

## 2022-10-13 DIAGNOSIS — E1139 Type 2 diabetes mellitus with other diabetic ophthalmic complication: Secondary | ICD-10-CM | POA: Diagnosis not present

## 2022-10-13 DIAGNOSIS — H401121 Primary open-angle glaucoma, left eye, mild stage: Secondary | ICD-10-CM | POA: Diagnosis not present

## 2022-10-13 DIAGNOSIS — H4051X3 Glaucoma secondary to other eye disorders, right eye, severe stage: Secondary | ICD-10-CM | POA: Diagnosis not present

## 2022-10-13 DIAGNOSIS — H1811 Bullous keratopathy, right eye: Secondary | ICD-10-CM | POA: Diagnosis not present

## 2022-10-26 DIAGNOSIS — M65331 Trigger finger, right middle finger: Secondary | ICD-10-CM | POA: Diagnosis not present

## 2022-10-27 ENCOUNTER — Other Ambulatory Visit: Payer: Self-pay | Admitting: Family

## 2022-10-31 DIAGNOSIS — R32 Unspecified urinary incontinence: Secondary | ICD-10-CM | POA: Diagnosis not present

## 2022-10-31 DIAGNOSIS — N3942 Incontinence without sensory awareness: Secondary | ICD-10-CM | POA: Diagnosis not present

## 2022-11-14 ENCOUNTER — Other Ambulatory Visit: Payer: Self-pay | Admitting: Family

## 2022-11-16 ENCOUNTER — Telehealth: Payer: Self-pay

## 2022-11-17 DIAGNOSIS — H401121 Primary open-angle glaucoma, left eye, mild stage: Secondary | ICD-10-CM | POA: Diagnosis not present

## 2022-11-24 DIAGNOSIS — E113293 Type 2 diabetes mellitus with mild nonproliferative diabetic retinopathy without macular edema, bilateral: Secondary | ICD-10-CM | POA: Diagnosis not present

## 2022-11-24 DIAGNOSIS — H1811 Bullous keratopathy, right eye: Secondary | ICD-10-CM | POA: Diagnosis not present

## 2022-11-24 DIAGNOSIS — H401121 Primary open-angle glaucoma, left eye, mild stage: Secondary | ICD-10-CM | POA: Diagnosis not present

## 2022-11-24 DIAGNOSIS — Z961 Presence of intraocular lens: Secondary | ICD-10-CM | POA: Diagnosis not present

## 2022-11-24 DIAGNOSIS — Z0100 Encounter for examination of eyes and vision without abnormal findings: Secondary | ICD-10-CM | POA: Diagnosis not present

## 2022-12-06 ENCOUNTER — Other Ambulatory Visit: Payer: Self-pay | Admitting: Internal Medicine

## 2022-12-06 ENCOUNTER — Other Ambulatory Visit: Payer: Self-pay | Admitting: Family

## 2022-12-12 DIAGNOSIS — L309 Dermatitis, unspecified: Secondary | ICD-10-CM | POA: Diagnosis not present

## 2022-12-14 ENCOUNTER — Ambulatory Visit: Payer: Medicare HMO | Admitting: Family

## 2022-12-14 ENCOUNTER — Encounter: Payer: Self-pay | Admitting: Family

## 2022-12-14 VITALS — BP 120/65 | HR 84 | Ht 67.0 in | Wt 196.0 lb

## 2022-12-14 DIAGNOSIS — E559 Vitamin D deficiency, unspecified: Secondary | ICD-10-CM | POA: Diagnosis not present

## 2022-12-14 DIAGNOSIS — E538 Deficiency of other specified B group vitamins: Secondary | ICD-10-CM | POA: Diagnosis not present

## 2022-12-14 DIAGNOSIS — Z23 Encounter for immunization: Secondary | ICD-10-CM | POA: Diagnosis not present

## 2022-12-14 DIAGNOSIS — N3942 Incontinence without sensory awareness: Secondary | ICD-10-CM | POA: Diagnosis not present

## 2022-12-14 DIAGNOSIS — I10 Essential (primary) hypertension: Secondary | ICD-10-CM | POA: Diagnosis not present

## 2022-12-14 DIAGNOSIS — N3281 Overactive bladder: Secondary | ICD-10-CM

## 2022-12-14 DIAGNOSIS — E039 Hypothyroidism, unspecified: Secondary | ICD-10-CM | POA: Diagnosis not present

## 2022-12-14 DIAGNOSIS — Z1159 Encounter for screening for other viral diseases: Secondary | ICD-10-CM | POA: Diagnosis not present

## 2022-12-14 DIAGNOSIS — R21 Rash and other nonspecific skin eruption: Secondary | ICD-10-CM

## 2022-12-14 DIAGNOSIS — E1165 Type 2 diabetes mellitus with hyperglycemia: Secondary | ICD-10-CM | POA: Diagnosis not present

## 2022-12-14 DIAGNOSIS — E782 Mixed hyperlipidemia: Secondary | ICD-10-CM

## 2022-12-14 DIAGNOSIS — M5440 Lumbago with sciatica, unspecified side: Secondary | ICD-10-CM

## 2022-12-14 DIAGNOSIS — G8929 Other chronic pain: Secondary | ICD-10-CM

## 2022-12-14 LAB — POC CREATINE & ALBUMIN,URINE
Albumin/Creatinine Ratio, Urine, POC: <30
Creatinine, POC: 100 mg/dL
Microalbumin Ur, POC: 10 mg/L

## 2022-12-14 LAB — POCT CBG (FASTING - GLUCOSE)-MANUAL ENTRY: Glucose Fasting, POC: 106 mg/dL — AB (ref 70–99)

## 2022-12-14 MED ORDER — VITAMIN D3 125 MCG (5000 UT) PO CAPS: 5000.0000 [IU] | ORAL_CAPSULE | Freq: Every day | ORAL | 1 refills | Status: DC

## 2022-12-14 MED ORDER — ESOMEPRAZOLE MAGNESIUM 40 MG PO CPDR: 40.0000 mg | DELAYED_RELEASE_CAPSULE | Freq: Every day | ORAL | 3 refills | Status: DC

## 2022-12-14 NOTE — Progress Notes (Signed)
Established Patient Office Visit  Subjective:  Patient ID: Wanda Gardner, female    DOB: [birth year]  Age: 82 y.o. MRN: [id number]  Chief Complaint  Patient presents with   Follow-up    referral    Patient is here today for her  follow up.  She has been feeling fairly well since last appointment.   She does have additional concerns to discuss today.  She says that she needs a new referral to dermatology, because she has a rash that needs to be dealt with.  She also needs refills of her urinary incontinence products.  She does continue to need them, as she has been having continued issues with these and is likely to do so for the rest of her life.   She uses Large pullups, and needs 5 per day, for 150 per month. She also needs underpads for her bed/chairs as she does have some leakage.   Labs are due today. She needs refills.   I have reviewed her active problem list, medication list, allergies, health maintenance, notes from last encounter, lab results for her appointment today.      No other concerns at this time.   Past Medical History:  Diagnosis Date   Acid reflux    AKI (acute kidney injury) (HCC) 06/10/2022   Arthritis    Asthma    Chest pain, unspecified 10/30/2013   Formatting of this note might be different from the original. Note: Unchanged Note: Unchanged Formatting of this note might be different from the original. Note: Unchanged Note: Unchanged   Constipation 04/20/2011   Formatting of this note might be different from the original. Note: Unchanged - probably [facility]. Note: Unchanged - probably [facility].   COPD (chronic obstructive pulmonary disease) (HCC)    Cough 10/30/2013   Formatting of this note might be different from the original. Note: Unchanged Note: Unchanged   Depression    Diabetes mellitus (HCC) 01/10/2022   Diabetes mellitus without complication (HCC)    Dyspnea    Glaucoma    Headache    right side of head   Headache 07/25/2011    Hyperlipidemia    Hypertension    Knee stiff 05/12/2022   Lens replaced 03/22/2010   Neuropathic pain of hands and feet    PONV (postoperative nausea and vomiting) 1982   back with my hysterectomy.   Pseudophakia 03/22/2010   Sleep apnea    Doesn't use CPAP   Stiffness of shoulder joint 05/12/2022   Trigger finger of right hand 01/10/2022   Wears dentures    full upper and lower    Past Surgical History:  Procedure Laterality Date   ABDOMINAL HYSTERECTOMY     CATARACT EXTRACTION W/ INTRAOCULAR LENS  IMPLANT, BILATERAL     CATARACT EXTRACTION W/PHACO Left 12/14/2021   Procedure: CATARACT EXTRACTION PHACO AND INTRAOCULAR LENS PLACEMENT (IOC) LEFT DIABETIC 24.79 02:23.9;  Surgeon: Lockie Mola, MD;  Location: [facility];  Service: Ophthalmology;  Laterality: Left;  Diabetic   CHOLECYSTECTOMY     COLONOSCOPY WITH PROPOFOL N/A 10/29/2014   Procedure: COLONOSCOPY WITH PROPOFOL;  Surgeon: Wallace Cullens, MD;  Location[facility] END[facility]COPY;  Service: Gastroenterology;  Laterality: N/A;   ESOPHAGOGASTRODUODENOSCOPY (EGD) WITH PROPOFOL N/A 10/29/2014   Procedure: ESOPHAGOGASTRODUODENOSCOPY (EGD) WITH PROPOFOL;  Surgeon: Wallace Cullens, MD;  Location: [facility] ENDOSCOPY;  Service: Gastroenterology;  Laterality: N/A;   EYE SURGERY Bilateral 2011   lid lift done 3 times   FOOT SURGERY     HAND  SURGERY     TONGUE SURGERY     removal of cancer   TONSILLECTOMY     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/13/2016   Procedure: TOTAL HIP ARTHROPLASTY ANTERIOR APPROACH;  Surgeon: Kennedy Bucker, MD;  Location: [facility] ORS;  Service: Orthopedics;  Laterality: Right;   TOTAL KNEE ARTHROPLASTY Right 11/23/2015   Procedure: TOTAL KNEE ARTHROPLASTY;  Surgeon: Kennedy Bucker, MD;  Location: [facility]S;  Service: Orthopedics;  Laterality: Right;   VOCAL CORD LATERALIZATION, ENDOSCOPIC APPROACH W/ MLB      Social History   Socioeconomic History   Marital status: Divorced    Spouse name: Not on file   Number of children: Not  on file   Years of education: Not on file   Highest education level: Not on file  Occupational History   Not on file  Tobacco Use   Smoking status: Every Day    Current packs/day: 0.50    Average packs/day: 0.5 packs/day for 50.0 years (25.0 ttl pk-yrs)    Types: Cigarettes   Smokeless tobacco: Never  Vaping Use   Vaping status: Never Used  Substance and Sexual Activity   Alcohol use: Yes    Alcohol/week: 0.0 standard drinks of alcohol    Comment: 1 glass of wine twice a year   Drug use: Yes    Types: Marijuana    Comment: weed- daily use for pain relief "when she can get  it"   Sexual activity: Not Currently  Other Topics Concern   Not on file  Social History Narrative   Not on file   Social Determinants of Health   Financial Resource Strain: Not on file  Food Insecurity: Food Insecurity Present (06/10/2022)   Hunger Vital Sign    Worried About Running Out of Food in the Last Year: Often true    Ran Out of Food in the Last Year: Often true  Transportation Needs: Unmet Transportation Needs (06/10/2022)   PRAPARE - Administrator, Civil Service (Medical): Yes    Lack of Transportation (Non-Medical): No  Physical Activity: Not on file  Stress: Not on file  Social Connections: Not on file  Intimate Partner Violence: Not At Risk (06/10/2022)   Humiliation, Afraid, Rape, and Kick questionnaire    Fear of Current or Ex-Partner: No    Emotionally Abused: No    Physically Abused: No    Sexually Abused: No    Family History  Problem Relation Age of Onset   Cancer Father    Heart disease Mother     No Known Allergies  Review of Systems  Genitourinary:  Positive for dysuria and urgency.       Incontinence without sensory awareness.  Skin:  Positive for itching and rash.  All other systems reviewed and are negative.      Objective:   BP 120/65   Pulse 84   Ht 5\' 7"  (1.702 m)   Wt 196 lb (88.9 kg)   SpO2 94%   BMI 30.70 kg/m   Vitals:   12/14/22  0941  BP: 120/65  Pulse: 84  Height: 5\' 7"  (1.702 m)  Weight: 196 lb (88.9 kg)  SpO2: 94%  BMI (Calculated): 30.69    Physical Exam Vitals and nursing note reviewed.  Constitutional:      Appearance: Normal appearance. She is normal weight.  HENT:     Head: Normocephalic.  Eyes:     Extraocular Movements: Extraocular movements intact.     Conjunctiva/sclera: Conjunctivae normal.  Pupils: Pupils are equal, round, and reactive to light.  Cardiovascular:     Rate and Rhythm: Normal rate.  Pulmonary:     Effort: Pulmonary effort is normal.  Neurological:     General: No focal deficit present.     Mental Status: She is alert and oriented to person, place, and time. Mental status is at baseline.  Psychiatric:        Mood and Affect: Mood normal.        Behavior: Behavior normal.        Thought Content: Thought content normal.        Judgment: Judgment normal.      Results for orders placed or performed in visit on 12/14/22  Lipid panel  Result Value Ref Range   Cholesterol, Total 140 100 - 199 mg/dL   Triglycerides 72 0 - 149 mg/dL   HDL 47 >16 mg/dL   VLDL Cholesterol Cal 14 5 - 40 mg/dL   LDL Chol Calc (NIH) 79 0 - 99 mg/dL   Chol/HDL Ratio 3.0 0.0 - 4.4 ratio  VITAMIN D 25 Hydroxy (Vit-D Deficiency, Fractures)  Result Value Ref Range   Vit D, 25-Hydroxy 67.4 30.0 - 100.0 ng/mL  CMP14+EGFR  Result Value Ref Range   Glucose 86 70 - 99 mg/dL   BUN 20 8 - 27 mg/dL   Creatinine, Ser 1.09 (H) 0.57 - 1.00 mg/dL   eGFR 44 (L) >60 AV/WUJ/8.11   BUN/Creatinine Ratio 16 12 - 28   Sodium 141 134 - 144 mmol/L   Potassium 4.1 3.5 - 5.2 mmol/L   Chloride 100 96 - 106 mmol/L   CO2 27 20 - 29 mmol/L   Calcium 9.6 8.7 - 10.3 mg/dL   Total Protein 6.8 6.0 - 8.5 g/dL   Albumin 4.1 3.8 - 4.8 g/dL   Globulin, Total 2.7 1.5 - 4.5 g/dL   Bilirubin Total 0.2 0.0 - 1.2 mg/dL   Alkaline Phosphatase 89 44 - 121 IU/L   AST 15 0 - 40 IU/L   ALT 11 0 - 32 IU/L  TSH  Result Value  Ref Range   TSH 1.190 0.450 - 4.500 uIU/mL  Hemoglobin A1c  Result Value Ref Range   Hgb A1c MFr Bld 7.5 (H) 4.8 - 5.6 %   Est. average glucose Bld gHb Est-mCnc 169 mg/dL  Vitamin B14  Result Value Ref Range   Vitamin B-12 813 232 - 1,245 pg/mL  CBC with Diff  Result Value Ref Range   WBC 5.8 3.4 - 10.8 x10E3/uL   RBC 3.59 (L) 3.77 - 5.28 x10E6/uL   Hemoglobin 9.2 (L) 11.1 - 15.9 g/dL   Hematocrit 78.2 (L) 95.6 - 46.6 %   MCV 82 79 - 97 fL   MCH 25.6 (L) 26.6 - 33.0 pg   MCHC 31.3 (L) 31.5 - 35.7 g/dL   RDW 21.3 08.6 - 57.8 %   Platelets 434 150 - 450 x10E3/uL   Neutrophils 65 Not Estab. %   Lymphs 24 Not Estab. %   Monocytes 7 Not Estab. %   Eos 3 Not Estab. %   Basos 1 Not Estab. %   Neutrophils Absolute 3.8 1.4 - 7.0 x10E3/uL   Lymphocytes Absolute 1.4 0.7 - 3.1 x10E3/uL   Monocytes Absolute 0.4 0.1 - 0.9 x10E3/uL   EOS (ABSOLUTE) 0.2 0.0 - 0.4 x10E3/uL   Basophils Absolute 0.0 0.0 - 0.2 x10E3/uL   Immature Granulocytes 0 Not Estab. %   Immature Grans (Abs) 0.0 0.0 -  0.1 x10E3/uL  POCT CBG (Fasting - Glucose)  Result Value Ref Range   Glucose Fasting, POC 106 (A) 70 - 99 mg/dL  POC CREATINE & ALBUMIN,URINE  Result Value Ref Range   Microalbumin Ur, POC 10 mg/L   Creatinine, POC 100 mg/dL   Albumin/Creatinine Ratio, Urine, POC <30     Recent Results (from the past 2160 hour(s))  POCT CBG (Fasting - Glucose)     Status: Abnormal   Collection Time: 12/14/22  9:48 AM  Result Value Ref Range   Glucose Fasting, POC 106 (A) 70 - 99 mg/dL  POC CREATINE & ALBUMIN,URINE     Status: None   Collection Time: 12/14/22 10:25 AM  Result Value Ref Range   Microalbumin Ur, POC 10 mg/L   Creatinine, POC 100 mg/dL   Albumin/Creatinine Ratio, Urine, POC <30   Lipid panel     Status: None   Collection Time: 12/14/22 10:32 AM  Result Value Ref Range   Cholesterol, Total 140 100 - 199 mg/dL   Triglycerides 72 0 - 149 mg/dL   HDL 47 >16 mg/dL   VLDL Cholesterol Cal 14 5 - 40  mg/dL   LDL Chol Calc (NIH) 79 0 - 99 mg/dL   Chol/HDL Ratio 3.0 0.0 - 4.4 ratio    Comment:                                   T. Chol/HDL Ratio                                             Men  Women                               1/2 Avg.Risk  3.4    3.3                                   Avg.Risk  5.0    4.4                                2X Avg.Risk  9.6    7.1                                3X Avg.Risk 23.4   11.0   VITAMIN D 25 Hydroxy (Vit-D Deficiency, Fractures)     Status: None   Collection Time: 12/14/22 10:32 AM  Result Value Ref Range   Vit D, 25-Hydroxy 67.4 30.0 - 100.0 ng/mL    Comment: Vitamin D deficiency has been defined by the Institute of Medicine and an Endocrine Society practice guideline as a level of serum 25-OH vitamin D less than 20 ng/mL (1,2). The Endocrine Society went on to further define vitamin D insufficiency as a level between 21 and 29 ng/mL (2). 1. IOM (Institute of Medicine). 2010. Dietary reference    intakes for calcium and D. [place]: The    [employer]. 2. Holick MF, Binkley [place], Bischoff-Ferrari HA, et al.    Evaluation, treatment, and prevention of vitamin D  deficiency: an Endocrine Society clinical practice    guideline. JCEM. 2011 Jul; 96(7):1911-30.   CMP14+EGFR     Status: Abnormal   Collection Time: 12/14/22 10:32 AM  Result Value Ref Range   Glucose 86 70 - 99 mg/dL   BUN 20 8 - 27 mg/dL   Creatinine, Ser 8.29 (H) 0.57 - 1.00 mg/dL   eGFR 44 (L) >56 OZ/HYQ/6.57   BUN/Creatinine Ratio 16 12 - 28   Sodium 141 134 - 144 mmol/L   Potassium 4.1 3.5 - 5.2 mmol/L   Chloride 100 96 - 106 mmol/L   CO2 27 20 - 29 mmol/L   Calcium 9.6 8.7 - 10.3 mg/dL   Total Protein 6.8 6.0 - 8.5 g/dL   Albumin 4.1 3.8 - 4.8 g/dL   Globulin, Total 2.7 1.5 - 4.5 g/dL   Bilirubin Total 0.2 0.0 - 1.2 mg/dL   Alkaline Phosphatase 89 44 - 121 IU/L   AST 15 0 - 40 IU/L   ALT 11 0 - 32 IU/L  TSH     Status: None   Collection Time:  12/14/22 10:32 AM  Result Value Ref Range   TSH 1.190 0.450 - 4.500 uIU/mL  Hemoglobin A1c     Status: Abnormal   Collection Time: 12/14/22 10:32 AM  Result Value Ref Range   Hgb A1c MFr Bld 7.5 (H) 4.8 - 5.6 %    Comment:          Prediabetes: 5.7 - 6.4          Diabetes: >6.4          Glycemic control for adults with diabetes: <7.0    Est. average glucose Bld gHb Est-mCnc 169 mg/dL  Vitamin Q46     Status: None   Collection Time: 12/14/22 10:32 AM  Result Value Ref Range   Vitamin B-12 813 232 - 1,245 pg/mL  CBC with Diff     Status: Abnormal   Collection Time: 12/14/22 10:32 AM  Result Value Ref Range   WBC 5.8 3.4 - 10.8 x10E3/uL   RBC 3.59 (L) 3.77 - 5.28 x10E6/uL   Hemoglobin 9.2 (L) 11.1 - 15.9 g/dL   Hematocrit 96.2 (L) 95.2 - 46.6 %   MCV 82 79 - 97 fL   MCH 25.6 (L) 26.6 - 33.0 pg   MCHC 31.3 (L) 31.5 - 35.7 g/dL   RDW 84.1 32.4 - 40.1 %   Platelets 434 150 - 450 x10E3/uL   Neutrophils 65 Not Estab. %   Lymphs 24 Not Estab. %   Monocytes 7 Not Estab. %   Eos 3 Not Estab. %   Basos 1 Not Estab. %   Neutrophils Absolute 3.8 1.4 - 7.0 x10E3/uL   Lymphocytes Absolute 1.4 0.7 - 3.1 x10E3/uL   Monocytes Absolute 0.4 0.1 - 0.9 x10E3/uL   EOS (ABSOLUTE) 0.2 0.0 - 0.4 x10E3/uL   Basophils Absolute 0.0 0.0 - 0.2 x10E3/uL   Immature Granulocytes 0 Not Estab. %   Immature Grans (Abs) 0.0 0.0 - 0.1 x10E3/uL       Assessment & Plan:   Problem List Items Addressed This Visit       Active Problems   Low back pain   Type 2 diabetes mellitus with hyperglycemia (HCC) - Primary    Checking labs today. Will call pt. With results  Continue current diabetes POC, as patient has been well controlled on current regimen.  Will adjust meds if needed based on labs.  Relevant Medications   losartan (COZAAR) 50 MG tablet   Other Relevant Orders   POCT CBG (Fasting - Glucose) (Completed)   CMP14+EGFR (Completed)   TSH (Completed)   Hemoglobin A1c (Completed)   CBC  with Diff (Completed)   POC CREATINE & ALBUMIN,URINE (Completed)   Essential hypertension, benign    Blood pressure well controlled with current medications.  Continue current therapy.  Will reassess at follow up.       Relevant Medications   losartan (COZAAR) 50 MG tablet   Other Relevant Orders   CMP14+EGFR (Completed)   TSH (Completed)   CBC with Diff (Completed)   Mixed hyperlipidemia    Checking labs today.  Continue current therapy for lipid control. Will modify as needed based on labwork results.        Relevant Medications   losartan (COZAAR) 50 MG tablet   Other Relevant Orders   Lipid panel (Completed)   CMP14+EGFR (Completed)   TSH (Completed)   CBC with Diff (Completed)   Vitamin D deficiency, unspecified    Checking labs today.  Will continue supplements as needed.        Relevant Orders   VITAMIN D 25 Hydroxy (Vit-D Deficiency, Fractures) (Completed)   CMP14+EGFR (Completed)   TSH (Completed)   CBC with Diff (Completed)   B12 deficiency due to diet    Checking labs today.  Will continue supplements as needed.       Relevant Orders   CMP14+EGFR (Completed)   TSH (Completed)   Vitamin B12 (Completed)   CBC with Diff (Completed)   Urinary incontinence without sensory awareness    Sending order for patient for urinary incontinence supplies.  Will also include note.   Reassess at follow up.       Overactive bladder    Sending order for patient for urinary incontinence supplies.  Will also include note.   Reassess at follow up.       Other Visit Diagnoses     Flu vaccine need       Flu vaccine given in office today.  Patient encouraged to manage symptoms as needed with supportive measures.   Relevant Orders   Flu Vaccine Trivalent High Dose (Fluad) (Completed)   CMP14+EGFR (Completed)   TSH (Completed)   CBC with Diff (Completed)   Need for hepatitis C screening test       Test ordered in office today. Will call with results.    Relevant Orders   CMP14+EGFR (Completed)   TSH (Completed)   CBC with Diff (Completed)   Rash       Relevant Orders   Ambulatory referral to Dermatology       Return in about 1 month (around 01/14/2023) for AWV.   Total time spent: 30 minutes  Miki Kins, FNP  12/14/2022   This document may have been prepared by [facility] Voice Recognition software and as such may include unintentional dictation errors.

## 2022-12-15 ENCOUNTER — Telehealth: Payer: Self-pay | Admitting: Family

## 2022-12-15 LAB — CMP14+EGFR
ALT: 11 [IU]/L (ref 0–32)
AST: 15 [IU]/L (ref 0–40)
Albumin: 4.1 g/dL (ref 3.8–4.8)
Alkaline Phosphatase: 89 [IU]/L (ref 44–121)
BUN/Creatinine Ratio: 16 (ref 12–28)
BUN: 20 mg/dL (ref 8–27)
Bilirubin Total: 0.2 mg/dL (ref 0.0–1.2)
CO2: 27 mmol/L (ref 20–29)
Calcium: 9.6 mg/dL (ref 8.7–10.3)
Chloride: 100 mmol/L (ref 96–106)
Creatinine, Ser: 1.24 mg/dL — ABNORMAL HIGH (ref 0.57–1.00)
Globulin, Total: 2.7 g/dL (ref 1.5–4.5)
Glucose: 86 mg/dL (ref 70–99)
Potassium: 4.1 mmol/L (ref 3.5–5.2)
Sodium: 141 mmol/L (ref 134–144)
Total Protein: 6.8 g/dL (ref 6.0–8.5)
eGFR: 44 mL/min/{1.73_m2} — ABNORMAL LOW (ref >59)

## 2022-12-15 LAB — CBC WITH DIFFERENTIAL/PLATELET
Basophils Absolute: 0 10*3/uL (ref 0.0–0.2)
Basos: 1 %
EOS (ABSOLUTE): 0.2 10*3/uL (ref 0.0–0.4)
Eos: 3 %
Hematocrit: 29.4 % — ABNORMAL LOW (ref 34.0–46.6)
Hemoglobin: 9.2 g/dL — ABNORMAL LOW (ref 11.1–15.9)
Immature Grans (Abs): 0 10*3/uL (ref 0.0–0.1)
Immature Granulocytes: 0 %
Lymphocytes Absolute: 1.4 10*3/uL (ref 0.7–3.1)
Lymphs: 24 %
MCH: 25.6 pg — ABNORMAL LOW (ref 26.6–33.0)
MCHC: 31.3 g/dL — ABNORMAL LOW (ref 31.5–35.7)
MCV: 82 fL (ref 79–97)
Monocytes Absolute: 0.4 10*3/uL (ref 0.1–0.9)
Monocytes: 7 %
Neutrophils Absolute: 3.8 10*3/uL (ref 1.4–7.0)
Neutrophils: 65 %
Platelets: 434 10*3/uL (ref 150–450)
RBC: 3.59 x10E6/uL — ABNORMAL LOW (ref 3.77–5.28)
RDW: 14.1 % (ref 11.7–15.4)
WBC: 5.8 10*3/uL (ref 3.4–10.8)

## 2022-12-15 LAB — LIPID PANEL
Chol/HDL Ratio: 3 ratio (ref 0.0–4.4)
Cholesterol, Total: 140 mg/dL (ref 100–199)
HDL: 47 mg/dL (ref >39)
LDL Chol Calc (NIH): 79 mg/dL (ref 0–99)
Triglycerides: 72 mg/dL (ref 0–149)
VLDL Cholesterol Cal: 14 mg/dL (ref 5–40)

## 2022-12-15 LAB — TSH: TSH: 1.19 u[IU]/mL (ref 0.450–4.500)

## 2022-12-15 LAB — VITAMIN D 25 HYDROXY (VIT D DEFICIENCY, FRACTURES): Vit D, 25-Hydroxy: 67.4 ng/mL (ref 30.0–100.0)

## 2022-12-15 LAB — HEMOGLOBIN A1C
Est. average glucose Bld gHb Est-mCnc: 169 mg/dL
Hgb A1c MFr Bld: 7.5 % — ABNORMAL HIGH (ref 4.8–5.6)

## 2022-12-15 LAB — VITAMIN B12: Vitamin B-12: 813 pg/mL (ref 232–1245)

## 2022-12-15 NOTE — Telephone Encounter (Signed)
Britta Mccreedy from [employer] left VM needing most recent office note for patient. I see she was just seen and the note is not completed yet. Here are the things I need included in the note please: size and type of supplies, the frequency of use, that the patient has a continued need for incontinence supplies. The insurance is requesting 2 codes now and the 2 that are working the best are R32 and N32.81. Also needs an updated date on the order.  Please let me know when this is completed so I can fax in to [phone].

## 2022-12-26 ENCOUNTER — Encounter: Payer: Self-pay | Admitting: Family

## 2022-12-26 DIAGNOSIS — E538 Deficiency of other specified B group vitamins: Secondary | ICD-10-CM | POA: Insufficient documentation

## 2022-12-26 DIAGNOSIS — E782 Mixed hyperlipidemia: Secondary | ICD-10-CM | POA: Insufficient documentation

## 2022-12-26 DIAGNOSIS — N3281 Overactive bladder: Secondary | ICD-10-CM | POA: Insufficient documentation

## 2022-12-26 DIAGNOSIS — I10 Essential (primary) hypertension: Secondary | ICD-10-CM | POA: Insufficient documentation

## 2022-12-26 DIAGNOSIS — N3942 Incontinence without sensory awareness: Secondary | ICD-10-CM | POA: Insufficient documentation

## 2022-12-26 DIAGNOSIS — E559 Vitamin D deficiency, unspecified: Secondary | ICD-10-CM | POA: Insufficient documentation

## 2022-12-26 NOTE — Assessment & Plan Note (Signed)
Checking labs today.  Will continue supplements as needed.  

## 2022-12-26 NOTE — Assessment & Plan Note (Signed)
Checking labs today. Will call pt. With results  Continue current diabetes POC, as patient has been well controlled on current regimen.  Will adjust meds if needed based on labs.  

## 2022-12-26 NOTE — Assessment & Plan Note (Signed)
Sending order for patient for urinary incontinence supplies.  Will also include note.   Reassess at follow up.

## 2022-12-26 NOTE — Assessment & Plan Note (Signed)
Blood pressure well controlled with current medications.  Continue current therapy.  Will reassess at follow up.  

## 2022-12-26 NOTE — Assessment & Plan Note (Signed)
Checking labs today.  Continue current therapy for lipid control. Will modify as needed based on labwork results.  

## 2022-12-28 DIAGNOSIS — N3281 Overactive bladder: Secondary | ICD-10-CM | POA: Diagnosis not present

## 2022-12-28 DIAGNOSIS — N3942 Incontinence without sensory awareness: Secondary | ICD-10-CM | POA: Diagnosis not present

## 2023-01-02 ENCOUNTER — Other Ambulatory Visit: Payer: Self-pay | Admitting: Family

## 2023-01-09 DIAGNOSIS — H1811 Bullous keratopathy, right eye: Secondary | ICD-10-CM | POA: Diagnosis not present

## 2023-01-09 DIAGNOSIS — H401121 Primary open-angle glaucoma, left eye, mild stage: Secondary | ICD-10-CM | POA: Diagnosis not present

## 2023-01-09 DIAGNOSIS — H43812 Vitreous degeneration, left eye: Secondary | ICD-10-CM | POA: Diagnosis not present

## 2023-01-17 ENCOUNTER — Ambulatory Visit: Payer: Medicare HMO | Admitting: Cardiology

## 2023-02-26 ENCOUNTER — Other Ambulatory Visit: Payer: Self-pay | Admitting: Family

## 2023-02-26 MED ORDER — BD PEN NEEDLE NANO U/F 32G X 4 MM MISC: 1.0000 | Freq: Four times a day (QID) | 11 refills | Status: DC

## 2023-03-01 ENCOUNTER — Other Ambulatory Visit: Payer: Self-pay | Admitting: Family

## 2023-03-01 ENCOUNTER — Telehealth: Payer: Self-pay

## 2023-03-01 NOTE — Telephone Encounter (Signed)
 Wanda Gardner with [facility] lvm stating pt needs a rx request sent to them for lancets- please advise-HQ

## 2023-03-02 ENCOUNTER — Other Ambulatory Visit: Payer: Self-pay | Admitting: Family

## 2023-03-02 MED ORDER — ACCU-CHEK SOFTCLIX LANCETS MISC: 12 refills | Status: AC

## 2023-03-02 NOTE — Telephone Encounter (Signed)
Lancets have been sent

## 2023-03-30 ENCOUNTER — Telehealth: Payer: Self-pay | Admitting: Family

## 2023-03-30 NOTE — Telephone Encounter (Signed)
 Barbara left VM that they have been trying to reach the patient since January 30th because her Mylene is no longer eligible and they need to know if she has new insurance. We do not have any updated insurance for this patient. Heron has been informed that we have no updated insurance info as we have not seen her this year yet.

## 2023-04-27 ENCOUNTER — Other Ambulatory Visit: Payer: Self-pay | Admitting: Internal Medicine

## 2023-04-27 ENCOUNTER — Other Ambulatory Visit: Payer: Self-pay | Admitting: Family

## 2023-05-02 DIAGNOSIS — N3281 Overactive bladder: Secondary | ICD-10-CM | POA: Diagnosis not present

## 2023-05-03 DIAGNOSIS — H401121 Primary open-angle glaucoma, left eye, mild stage: Secondary | ICD-10-CM | POA: Diagnosis not present

## 2023-05-03 DIAGNOSIS — H1811 Bullous keratopathy, right eye: Secondary | ICD-10-CM | POA: Diagnosis not present

## 2023-05-07 ENCOUNTER — Encounter: Payer: Self-pay | Admitting: Family

## 2023-05-07 ENCOUNTER — Ambulatory Visit (INDEPENDENT_AMBULATORY_CARE_PROVIDER_SITE_OTHER): Admitting: Family

## 2023-05-07 VITALS — BP 120/64 | HR 97 | Ht 67.0 in | Wt 201.0 lb

## 2023-05-07 DIAGNOSIS — R1319 Other dysphagia: Secondary | ICD-10-CM

## 2023-05-07 DIAGNOSIS — I83813 Varicose veins of bilateral lower extremities with pain: Secondary | ICD-10-CM

## 2023-05-07 DIAGNOSIS — Z0130 Encounter for examination of blood pressure without abnormal findings: Secondary | ICD-10-CM

## 2023-05-07 DIAGNOSIS — K5904 Chronic idiopathic constipation: Secondary | ICD-10-CM | POA: Diagnosis not present

## 2023-05-07 DIAGNOSIS — L602 Onychogryphosis: Secondary | ICD-10-CM

## 2023-05-07 MED ORDER — BD PEN NEEDLE NANO U/F 32G X 4 MM MISC: 1.0000 | Freq: Four times a day (QID) | 11 refills | Status: AC

## 2023-05-07 MED ORDER — LANTUS SOLOSTAR 100 UNIT/ML ~~LOC~~ SOPN: 30.0000 [IU] | PEN_INJECTOR | Freq: Every day | SUBCUTANEOUS | 2 refills | Status: DC

## 2023-05-07 MED ORDER — INSULIN LISPRO (1 UNIT DIAL) 100 UNIT/ML (KWIKPEN): 10.0000 [IU] | PEN_INJECTOR | Freq: Three times a day (TID) | SUBCUTANEOUS | 2 refills | Status: DC

## 2023-05-07 MED ORDER — FLUTICASONE PROPIONATE 50 MCG/ACT NA SUSP: 2.0000 | Freq: Every day | NASAL | 11 refills | Status: AC | PRN

## 2023-05-07 MED ORDER — ESOMEPRAZOLE MAGNESIUM 40 MG PO CPDR: 40.0000 mg | DELAYED_RELEASE_CAPSULE | Freq: Every day | ORAL | 3 refills | Status: AC

## 2023-05-07 MED ORDER — LACTULOSE 10 GM/15ML PO SOLN: 10.0000 g | Freq: Two times a day (BID) | ORAL | 0 refills | Status: DC | PRN

## 2023-05-07 MED ORDER — ALBUTEROL SULFATE HFA 108 (90 BASE) MCG/ACT IN AERS: 2.0000 | INHALATION_SPRAY | Freq: Four times a day (QID) | RESPIRATORY_TRACT | 2 refills | Status: DC | PRN

## 2023-05-07 NOTE — Progress Notes (Signed)
 Established Patient Office Visit  Subjective:  Patient ID: Wanda Gardner, female    DOB: Feb 23, 1940  Age: 83 y.o. MRN: [id number]  Chief Complaint  Patient presents with   Follow-up    Bowel issues    Patient is here today for her 4 months follow up.  She has been feeling poorly since last appointment.   She does have additional concerns to discuss today.  Food is getting stuck in her throat.  Thinks she needs a colonoscopy, has been having extreme constipation, says she can go without a BM for 2 weeks, says that she had some liquid laxative in the hospital, wants to start this.   Leg swelling, veins. She has been doing her best to manage this with keeping her feet up and her compression socks on.  Toes contracted. Causing her some pain. She would like to see the foot doctor for this.  Also needs her toenails cut.  Labs are due today. She needs refills.   I have reviewed her active problem list, medication list, allergies, notes from last encounter, lab results for her appointment today.     No other concerns at this time.   Past Medical History:  Diagnosis Date   Acid reflux    AKI (acute kidney injury) (HCC) 06/10/2022   Arthritis    Asthma    Chest pain, unspecified 10/30/2013   Formatting of this note might be different from the original. Note: Unchanged Note: Unchanged Formatting of this note might be different from the original. Note: Unchanged Note: Unchanged   Constipation 04/20/2011   Formatting of this note might be different from the original. Note: Unchanged - probably CIC. Note: Unchanged - probably CIC.   COPD (chronic obstructive pulmonary disease) (HCC)    Cough 10/30/2013   Formatting of this note might be different from the original. Note: Unchanged Note: Unchanged   Depression    Diabetes mellitus (HCC) 01/10/2022   Diabetes mellitus without complication (HCC)    Dyspnea    Glaucoma    Headache    right side of head   Headache 07/25/2011    Hyperlipidemia    Hypertension    Knee stiff 05/12/2022   Lens replaced 03/22/2010   Neuropathic pain of hands and feet    PONV (postoperative nausea and vomiting) 1982   back with my hysterectomy.   Pseudophakia 03/22/2010   Sleep apnea    Doesn't use CPAP   Stiffness of shoulder joint 05/12/2022   Trigger finger of right hand 01/10/2022   Wears dentures    full upper and lower    Past Surgical History:  Procedure Laterality Date   ABDOMINAL HYSTERECTOMY     CATARACT EXTRACTION W/ INTRAOCULAR LENS  IMPLANT, BILATERAL     CATARACT EXTRACTION W/PHACO Left 12/14/2021   Procedure: CATARACT EXTRACTION PHACO AND INTRAOCULAR LENS PLACEMENT (IOC) LEFT DIABETIC 24.79 02:23.9;  Surgeon: Lockie Mola, MD;  Location[facility];  Service: Ophthalmology;  Laterality: Left;  Diabetic   CHOLECYSTECTOMY     COLONOSCOPY WITH PROPOFOL N/A 10/29/2014   Procedure: COLONOSCOPY WITH PROPOFOL;  Surgeon: Wallace Cullens, MD;  Location: [facility];  Service: Gastroenterology;  Laterality: N/A;   ESOPHAGOGASTRODUODENOSCOPY (EGD) WITH PROPOFOL N/A 10/29/2014   Procedure: ESOPHAGOGASTRODUODENOSCOPY (EGD) WITH PROPOFOL;  Surgeon: Wallace Cullens, MD;  Location: [facility] ENDOSCOPY;  Service: Gastroenterology;  Laterality: N/A;   EYE SURGERY Bilateral 2011   lid lift done 3 times   FOOT SURGERY     HAND SURGERY  TONGUE SURGERY     removal of cancer   TONSILLECTOMY     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/13/2016   Procedure: TOTAL HIP ARTHROPLASTY ANTERIOR APPROACH;  Surgeon: Kennedy Bucker, MD;  Location: [facility] ORS;  Service: Orthopedics;  Laterality: Right;   TOTAL KNEE ARTHROPLASTY Right 11/23/2015   Procedure: TOTAL KNEE ARTHROPLASTY;  Surgeon: Kennedy Bucker, MD;  Location: [facility] ORS;  Service: Orthopedics;  Laterality: Right;   VOCAL CORD LATERALIZATION, ENDOSCOPIC APPROACH W/ MLB      Social History   Socioeconomic History   Marital status: Divorced    Spouse name: Not on file   Number of children: Not  on file   Years of education: Not on file   Highest education level: Not on file  Occupational History   Not on file  Tobacco Use   Smoking status: Every Day    Current packs/day: 0.50    Average packs/day: 0.5 packs/day for 50.0 years (25.0 ttl pk-yrs)    Types: Cigarettes   Smokeless tobacco: Never  Vaping Use   Vaping status: Never Used  Substance and Sexual Activity   Alcohol use: Yes    Alcohol/week: 0.0 standard drinks of alcohol    Comment: 1 glass of wine twice a year   Drug use: Yes    Types: Marijuana    Comment: weed- daily use for pain relief "when she can get  it"   Sexual activity: Not Currently  Other Topics Concern   Not on file  Social History Narrative   Not on file   Social Drivers of Health   Financial Resource Strain: Not on file  Food Insecurity: Food Insecurity Present (06/10/2022)   Hunger Vital Sign    Worried About Running Out of Food in the Last Year: Often true    Ran Out of Food in the Last Year: Often true  Transportation Needs: Unmet Transportation Needs (06/10/2022)   PRAPARE - Administrator, Civil Service (Medical): Yes    Lack of Transportation (Non-Medical): No  Physical Activity: Not on file  Stress: Not on file  Social Connections: Not on file  Intimate Partner Violence: Not At Risk (06/10/2022)   Humiliation, Afraid, Rape, and Kick questionnaire    Fear of Current or Ex-Partner: No    Emotionally Abused: No    Physically Abused: No    Sexually Abused: No    Family History  Problem Relation Age of Onset   Cancer Father    Heart disease Mother     No Known Allergies  Review of Systems  Cardiovascular:  Positive for leg swelling.  Gastrointestinal:  Positive for abdominal pain and constipation.       Difficulty swallowing  Musculoskeletal:        Pain in feet  All other systems reviewed and are negative.      Objective:   BP 120/64   Pulse 97   Ht 5\' 7"  (1.702 m)   Wt 201 lb (91.2 kg)   SpO2 97%    BMI 31.48 kg/m   Vitals:   05/07/23 1526  BP: 120/64  Pulse: 97  Height: 5\' 7"  (1.702 m)  Weight: 201 lb (91.2 kg)  SpO2: 97%  BMI (Calculated): 31.47    Physical Exam Vitals and nursing note reviewed.  Constitutional:      Appearance: Normal appearance. She is normal weight.  HENT:     Head: Normocephalic.  Eyes:     Extraocular Movements: Extraocular movements intact.  Conjunctiva/sclera: Conjunctivae normal.     Pupils: Pupils are equal, round, and reactive to light.  Cardiovascular:     Rate and Rhythm: Normal rate.  Pulmonary:     Effort: Pulmonary effort is normal.  Musculoskeletal:     Left foot: Deformity present.  Feet:     Right foot:     Toenail Condition: Right toenails are abnormally thick. Fungal disease present.    Left foot:     Toenail Condition: Left toenails are abnormally thick. Fungal disease present. Neurological:     General: No focal deficit present.     Mental Status: She is alert and oriented to person, place, and time. Mental status is at baseline.  Psychiatric:        Attention and Perception: Attention and perception normal.        Mood and Affect: Mood and affect normal.        Speech: Speech normal.        Behavior: Behavior normal. Behavior is cooperative.        Thought Content: Thought content normal.        Cognition and Memory: Cognition and memory normal.        Judgment: Judgment normal.      No results found for any visits on 05/07/23.  No results found for this or any previous visit (from the past 2160 hours).     Assessment & Plan:   Problem List Items Addressed This Visit   None Visit Diagnoses       Esophageal dysphagia    -  Primary   sending referral to GI. Will defer to them for further treatment changes.   Relevant Orders   Ambulatory referral to Gastroenterology     Hypertrophy of nail       Sending to podiatry for foot care. Will defer to them for further treatment changes.   Relevant Orders    Ambulatory referral to Podiatry     Chronic idiopathic constipation       sending referral to GI for patient.  She is asking about a colonoscopy. Also having dysphagia, so will defer to them for treatment decisions.   Relevant Medications   lactulose (CHRONULAC) 10 GM/15ML solution   Other Relevant Orders   Ambulatory referral to Gastroenterology     Varicose veins of both lower extremities with pain       Relevant Orders   Ambulatory referral to Vascular Surgery     Esophageal dysphagia       Sending referral to GI. Will defer to them for further treatment changes.   Relevant Orders   Ambulatory referral to Gastroenterology       Return in about 1 month (around 06/07/2023) for F/U.   Total time spent: 30 minutes  Miki Kins, FNP  05/07/2023   This document may have been prepared by [facility] Voice Recognition software and as such may include unintentional dictation errors.

## 2023-05-08 ENCOUNTER — Other Ambulatory Visit: Payer: Self-pay | Admitting: Family

## 2023-05-17 ENCOUNTER — Ambulatory Visit (INDEPENDENT_AMBULATORY_CARE_PROVIDER_SITE_OTHER): Admitting: Podiatry

## 2023-05-17 DIAGNOSIS — L603 Nail dystrophy: Secondary | ICD-10-CM | POA: Diagnosis not present

## 2023-05-17 NOTE — Progress Notes (Signed)
 Subjective:  Patient ID: Wanda Gardner, female    DOB: Jan 09, 1941,  MRN: [id number]  Chief Complaint  Patient presents with   Toe Pain    83 y.o. female presents with the above complaint.  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  She wanted discuss treatment options for it.  She has not seen and was prior to seeing me denies any other acute complaints she does not want to have it removed she wants to discuss options for nail fungus.   Review of Systems: Negative except as noted in the HPI. Denies N/V/F/Ch.  Past Medical History:  Diagnosis Date   Acid reflux    AKI (acute kidney injury) (HCC) 06/10/2022   Arthritis    Asthma    Chest pain, unspecified 10/30/2013   Formatting of this note might be different from the original. Note: Unchanged Note: Unchanged Formatting of this note might be different from the original. Note: Unchanged Note: Unchanged   Constipation 04/20/2011   Formatting of this note might be different from the original. Note: Unchanged - probably CIC. Note: Unchanged - probably CIC.   COPD (chronic obstructive pulmonary disease) (HCC)    Cough 10/30/2013   Formatting of this note might be different from the original. Note: Unchanged Note: Unchanged   Depression    Diabetes mellitus (HCC) 01/10/2022   Diabetes mellitus without complication (HCC)    Dyspnea    Glaucoma    Headache    right side of head   Headache 07/25/2011   Hyperlipidemia    Hypertension    Knee stiff 05/12/2022   Lens replaced 03/22/2010   Neuropathic pain of hands and feet    PONV (postoperative nausea and vomiting) 1982   back with my hysterectomy.   Pseudophakia 03/22/2010   Sleep apnea    Doesn't use CPAP   Stiffness of shoulder joint 05/12/2022   Trigger finger of right hand 01/10/2022   Wears dentures    full upper and lower    Current Outpatient Medications:    Accu-Chek Softclix Lancets lancets, Use with  device to check sugars twice daily, Disp: 100 each, Rfl: 12   albuterol (VENTOLIN HFA) 108 (90 Base) MCG/ACT inhaler, Inhale 2 puffs into the lungs every 6 (six) hours as needed for wheezing or shortness of breath., Disp: 8 g, Rfl: 2   betamethasone, augmented, (DIPROLENE) 0.05 % lotion, Apply 1 Application topically 2 (two) times daily., Disp: , Rfl:    chlorthalidone (HYGROTON) 25 MG tablet, TAKE 1 TABLET BY MOUTH DAILY, Disp: 90 tablet, Rfl: 2   Cholecalciferol (VITAMIN D3) 125 MCG (5000 UT) CAPS, Take 1 capsule (5,000 Units total) by mouth daily., Disp: 90 capsule, Rfl: 1   diclofenac sodium (VOLTAREN) 1 % GEL, Apply 2 g topically 2 (two) times daily as needed (pain). , Disp: , Rfl:    docusate sodium (COLACE) 100 MG capsule, Take 1 capsule (100 mg total) by mouth 2 (two) times daily., Disp: 60 capsule, Rfl: 0   dorzolamide-timolol (COSOPT) 2-0.5 % ophthalmic solution, Place 1 drop into both eyes 2 (two) times daily., Disp: , Rfl:    doxepin (SINEQUAN) 10 MG capsule, TAKE 1 CAPSULE BY MOUTH AT BEDTIME, Disp: 30 capsule, Rfl: 0   DULoxetine (CYMBALTA) 60 MG capsule, TAKE 1 CAPSULE BY MOUTH DAILY, Disp: 90 capsule, Rfl: 2   ELIQUIS 5 MG TABS tablet, TAKE 1 TABLET BY MOUTH TWICE A DAY, Disp: 60 tablet, Rfl: 3  esomeprazole (NEXIUM) 40 MG capsule, Take 1 capsule (40 mg total) by mouth daily., Disp: 90 capsule, Rfl: 3   ferrous sulfate 325 (65 FE) MG tablet, Take 325 mg by mouth daily with breakfast., Disp: , Rfl:    fluticasone (FLONASE) 50 MCG/ACT nasal spray, Place 2 sprays into both nostrils daily as needed for allergies., Disp: 16 g, Rfl: 11   furosemide (LASIX) 20 MG tablet, TAKE 1 TABLET BY MOUTH DAILY, Disp: 90 tablet, Rfl: 1   glucose blood (ACCU-CHEK GUIDE TEST) test strip, CHECK BLOOD SUGAR TWICE DAILY, Disp: 200 each, Rfl: 1   hydrOXYzine (ATARAX) 25 MG tablet, Take 25 mg by mouth at bedtime as needed (insomnia)., Disp: , Rfl:    insulin glargine (LANTUS SOLOSTAR) 100 UNIT/ML Solostar  Pen, Inject 30 Units into the skin at bedtime., Disp: 15 mL, Rfl: 2   insulin lispro (HUMALOG) 100 UNIT/ML KwikPen, Inject 10 Units into the skin 3 (three) times daily., Disp: 15 mL, Rfl: 2   Insulin Pen Needle (BD PEN NEEDLE NANO U/F) 32G X 4 MM MISC, 1 each by Does not apply route in the morning, at noon, in the evening, and at bedtime., Disp: 200 each, Rfl: 11   isosorbide mononitrate (IMDUR) 30 MG 24 hr tablet, Take 1 tablet (30 mg total) by mouth daily., Disp: 90 tablet, Rfl: 1   lactulose (CHRONULAC) 10 GM/15ML solution, Take 15 mLs (10 g total) by mouth 2 (two) times daily as needed for mild constipation, moderate constipation or severe constipation., Disp: 236 mL, Rfl: 0   losartan (COZAAR) 50 MG tablet, Take 50 mg by mouth daily., Disp: , Rfl:    metFORMIN (GLUCOPHAGE) 500 MG tablet, TAKE 1 TABLET BY MOUTH TWICE A DAY, Disp: 180 tablet, Rfl: 1   polyethylene glycol powder (GLYCOLAX/MIRALAX) powder, 2 cap fulls in a full glass of water, two times a day, for 5 days., Disp: 255 g, Rfl: 0   potassium chloride SA (K-DUR,KLOR-CON) 20 MEQ tablet, Take 20 mEq by mouth daily., Disp: , Rfl:    pravastatin (PRAVACHOL) 40 MG tablet, TAKE 1 TABLET BY MOUTH DAILY, Disp: 90 tablet, Rfl: 3   prednisoLONE acetate (PRED FORTE) 1 % ophthalmic suspension, Place 1 drop into the right eye 2 (two) times daily., Disp: , Rfl:    traMADol (ULTRAM) 50 MG tablet, TAKE 1 TABLET BY MOUTH EVERY 8 HOURS AS NEEDED, Disp: 90 tablet, Rfl: 0   vitamin B-12 (CYANOCOBALAMIN) 1000 MCG tablet, Take 1,000 mcg by mouth daily., Disp: , Rfl:   Social History   Tobacco Use  Smoking Status Every Day   Current packs/day: 0.50   Average packs/day: 0.5 packs/day for 50.0 years (25.0 ttl pk-yrs)   Types: Cigarettes  Smokeless Tobacco Never    No Known Allergies Objective:  There were no vitals filed for this visit. There is no height or weight on file to calculate BMI. Constitutional Well developed. Well nourished.  Vascular  Dorsalis pedis pulses palpable bilaterally. Posterior tibial pulses palpable bilaterally. Capillary refill normal to all digits.  No cyanosis or clubbing noted. Pedal hair growth normal.  Neurologic Normal speech. Oriented to person, place, and time. Epicritic sensation to light touch grossly present bilaterally.  Dermatologic Nails thickened onychodystrophy mycotic nail x 1 right hallux mild pain on palpation Skin within the limits  Orthopedic: Normal joint ROM without pain or crepitus bilaterally. No visible deformities. No bony tenderness.   Radiographs: None Assessment:   1. Nail dystrophy    Plan:  Patient was  evaluated and treated and all questions answered.  Right hallux nail dystrophy -All question concerns were discussed with the patient in extensive detail I discussed treatment options for nail fungus as well as possible nail removal.  She would like to think about the nail removal get back to me when she is ready.  No follow-ups on file.

## 2023-05-20 ENCOUNTER — Encounter: Payer: Self-pay | Admitting: Family

## 2023-06-01 ENCOUNTER — Other Ambulatory Visit: Payer: Self-pay | Admitting: Family

## 2023-06-01 DIAGNOSIS — N3281 Overactive bladder: Secondary | ICD-10-CM | POA: Diagnosis not present

## 2023-06-04 ENCOUNTER — Other Ambulatory Visit: Payer: Self-pay

## 2023-06-04 MED ORDER — METFORMIN HCL 500 MG PO TABS: 500.0000 mg | ORAL_TABLET | Freq: Two times a day (BID) | ORAL | 1 refills | Status: DC

## 2023-06-04 MED ORDER — APIXABAN 5 MG PO TABS: 5.0000 mg | ORAL_TABLET | Freq: Two times a day (BID) | ORAL | 3 refills | Status: DC

## 2023-06-05 ENCOUNTER — Other Ambulatory Visit: Payer: Self-pay | Admitting: Family

## 2023-06-07 ENCOUNTER — Ambulatory Visit: Admitting: Family

## 2023-07-02 DIAGNOSIS — N3281 Overactive bladder: Secondary | ICD-10-CM | POA: Diagnosis not present

## 2023-07-03 DIAGNOSIS — H1811 Bullous keratopathy, right eye: Secondary | ICD-10-CM | POA: Diagnosis not present

## 2023-07-03 DIAGNOSIS — H4051X3 Glaucoma secondary to other eye disorders, right eye, severe stage: Secondary | ICD-10-CM | POA: Diagnosis not present

## 2023-07-03 DIAGNOSIS — H401121 Primary open-angle glaucoma, left eye, mild stage: Secondary | ICD-10-CM | POA: Diagnosis not present

## 2023-07-19 ENCOUNTER — Encounter (INDEPENDENT_AMBULATORY_CARE_PROVIDER_SITE_OTHER)

## 2023-07-19 ENCOUNTER — Encounter (INDEPENDENT_AMBULATORY_CARE_PROVIDER_SITE_OTHER): Admitting: Nurse Practitioner

## 2023-07-26 ENCOUNTER — Other Ambulatory Visit (INDEPENDENT_AMBULATORY_CARE_PROVIDER_SITE_OTHER): Payer: Self-pay | Admitting: Vascular Surgery

## 2023-07-26 DIAGNOSIS — I83813 Varicose veins of bilateral lower extremities with pain: Secondary | ICD-10-CM

## 2023-07-30 ENCOUNTER — Encounter (INDEPENDENT_AMBULATORY_CARE_PROVIDER_SITE_OTHER): Admitting: Vascular Surgery

## 2023-07-30 ENCOUNTER — Encounter (INDEPENDENT_AMBULATORY_CARE_PROVIDER_SITE_OTHER)

## 2023-08-02 DIAGNOSIS — N3281 Overactive bladder: Secondary | ICD-10-CM | POA: Diagnosis not present

## 2023-08-14 DIAGNOSIS — L6681 Central centrifugal cicatricial alopecia: Secondary | ICD-10-CM | POA: Diagnosis not present

## 2023-08-31 DIAGNOSIS — M1712 Unilateral primary osteoarthritis, left knee: Secondary | ICD-10-CM | POA: Diagnosis not present

## 2023-08-31 DIAGNOSIS — E119 Type 2 diabetes mellitus without complications: Secondary | ICD-10-CM | POA: Diagnosis not present

## 2023-08-31 DIAGNOSIS — D6832 Hemorrhagic disorder due to extrinsic circulating anticoagulants: Secondary | ICD-10-CM | POA: Diagnosis not present

## 2023-09-04 DIAGNOSIS — H1811 Bullous keratopathy, right eye: Secondary | ICD-10-CM | POA: Diagnosis not present

## 2023-09-04 DIAGNOSIS — Z961 Presence of intraocular lens: Secondary | ICD-10-CM | POA: Diagnosis not present

## 2023-09-04 DIAGNOSIS — H401121 Primary open-angle glaucoma, left eye, mild stage: Secondary | ICD-10-CM | POA: Diagnosis not present

## 2023-09-04 DIAGNOSIS — H4051X3 Glaucoma secondary to other eye disorders, right eye, severe stage: Secondary | ICD-10-CM | POA: Diagnosis not present

## 2023-09-07 ENCOUNTER — Other Ambulatory Visit: Payer: Self-pay | Admitting: Internal Medicine

## 2023-09-07 ENCOUNTER — Other Ambulatory Visit: Payer: Self-pay | Admitting: Family

## 2023-09-07 NOTE — Progress Notes (Signed)
   09/07/2023  Patient ID: [name], female   DOB: November 14, 1940, 83 y.o.   MRN: [id number]  Pharmacy Quality Measure Review  This patient is appearing on a report for being at risk of failing the adherence measure for cholesterol (statin) and hypertension (ACEi/ARB) medications this calendar year.   Medication: Losartan  Last fill date: 09/03/23 for 90 day supply  Medication: Pravastatin  Last fill date: 08/06/23 for 90 day supply  Insurance report was not up to date. No action needed at this time.   Jon VEAR Lindau, PharmD Clinical Pharmacist [phone]

## 2023-09-10 ENCOUNTER — Encounter (INDEPENDENT_AMBULATORY_CARE_PROVIDER_SITE_OTHER)

## 2023-09-10 ENCOUNTER — Encounter (INDEPENDENT_AMBULATORY_CARE_PROVIDER_SITE_OTHER): Admitting: Vascular Surgery

## 2023-09-12 ENCOUNTER — Encounter (INDEPENDENT_AMBULATORY_CARE_PROVIDER_SITE_OTHER): Payer: Self-pay | Admitting: Vascular Surgery

## 2023-09-12 ENCOUNTER — Ambulatory Visit (INDEPENDENT_AMBULATORY_CARE_PROVIDER_SITE_OTHER)

## 2023-09-12 ENCOUNTER — Ambulatory Visit (INDEPENDENT_AMBULATORY_CARE_PROVIDER_SITE_OTHER): Admitting: Vascular Surgery

## 2023-09-12 VITALS — BP 115/64 | HR 75 | Resp 18

## 2023-09-12 DIAGNOSIS — E1165 Type 2 diabetes mellitus with hyperglycemia: Secondary | ICD-10-CM | POA: Diagnosis not present

## 2023-09-12 DIAGNOSIS — I4892 Unspecified atrial flutter: Secondary | ICD-10-CM

## 2023-09-12 DIAGNOSIS — Z794 Long term (current) use of insulin: Secondary | ICD-10-CM | POA: Diagnosis not present

## 2023-09-12 DIAGNOSIS — I83813 Varicose veins of bilateral lower extremities with pain: Secondary | ICD-10-CM

## 2023-09-12 DIAGNOSIS — I10 Essential (primary) hypertension: Secondary | ICD-10-CM

## 2023-09-12 NOTE — Progress Notes (Signed)
 Subjective:    Patient ID: Wanda Gardner, female    DOB: 08/28/1940, 83 y.o.   MRN: [id number] Chief Complaint  Patient presents with   New Patient (Initial Visit)    Ref Orlean consult vv.ble w/ pain    Wanda Gardner is an 83 yo female who presents to clinic today with chief complaint of pain in her bilateral lower extremities with varicose veins.  Patient was brought into the clinic in a wheelchair as she states the pain to her bilateral lower extremities keeps her from walking any considerable distance.  Patient's sister who is with her today at the visit states that she cannot walk more than 50 feet without having to sit down.  Patient endorses she has bilateral hip pain with bilateral lower leg pain at rest as well as when walking.  Pain is worse when she does try to walk.  She also has severe cramping to both of her legs left greater than right.  Patient endorses she has been smoking since she was 83 years old.  She denies any swelling to her legs today.  She does have a knee replacement to her right leg and needs 1 to her left leg but states they would not do both at the same time due to her diabetes.    Review of Systems  Constitutional: Negative.   Musculoskeletal:  Positive for back pain and gait problem.       Bilateral lower extremity pain to her legs at rest as well as on ambulation  Neurological:  Positive for weakness.       Weakness with the pain to her bilateral lower extremities upon rest and with ambulation  All other systems reviewed and are negative.      Objective:   Physical Exam Vitals reviewed.  Constitutional:      Appearance: Normal appearance. She is obese.  HENT:     Head: Normocephalic.  Eyes:     Pupils: Pupils are equal, round, and reactive to light.  Cardiovascular:     Rate and Rhythm: Normal rate and regular rhythm.     Pulses: Normal pulses.     Heart sounds: Normal heart sounds.  Pulmonary:     Effort: Pulmonary effort is normal.      Breath sounds: Normal breath sounds.  Abdominal:     General: Abdomen is flat. Bowel sounds are normal.     Palpations: Abdomen is soft.  Musculoskeletal:        General: Tenderness present.  Skin:    General: Skin is warm and dry.     Capillary Refill: Capillary refill takes 2 to 3 seconds.  Neurological:     General: No focal deficit present.     Mental Status: She is alert and oriented to person, place, and time. Mental status is at baseline.  Psychiatric:        Mood and Affect: Mood normal.        Behavior: Behavior normal.        Thought Content: Thought content normal.        Judgment: Judgment normal.     BP 115/64   Pulse 75   Resp 18   Past Medical History:  Diagnosis Date   Acid reflux    AKI (acute kidney injury) (HCC) 06/10/2022   Arthritis    Asthma    Chest pain, unspecified 10/30/2013   Formatting of this note might be different from the original. Note: Unchanged Note: Unchanged Formatting of this  note might be different from the original. Note: Unchanged Note: Unchanged   Constipation 04/20/2011   Formatting of this note might be different from the original. Note: Unchanged - probably [facility]. Note: Unchanged - probably [facility].   COPD (chronic obstructive pulmonary disease) (HCC)    Cough 10/30/2013   Formatting of this note might be different from the original. Note: Unchanged Note: Unchanged   Depression    Diabetes mellitus (HCC) 01/10/2022   Diabetes mellitus without complication (HCC)    Dyspnea    Glaucoma    Headache    right side of head   Headache 07/25/2011   Hyperlipidemia    Hypertension    Knee stiff 05/12/2022   Lens replaced 03/22/2010   Neuropathic pain of hands and feet    PONV (postoperative nausea and vomiting) 1982   back with my hysterectomy.   Pseudophakia 03/22/2010   Sleep apnea    Doesn't use CPAP   Stiffness of shoulder joint 05/12/2022   Trigger finger of right hand 01/10/2022   Wears dentures    full upper and lower     Social History   Socioeconomic History   Marital status: Divorced    Spouse name: Not on file   Number of children: Not on file   Years of education: Not on file   Highest education level: Not on file  Occupational History   Not on file  Tobacco Use   Smoking status: Every Day    Current packs/day: 0.50    Average packs/day: 0.5 packs/day for 50.0 years (25.0 ttl pk-yrs)    Types: Cigarettes   Smokeless tobacco: Never  Vaping Use   Vaping status: Never Used  Substance and Sexual Activity   Alcohol use: Yes    Alcohol/week: 0.0 standard drinks of alcohol    Comment: 1 glass of wine twice a year   Drug use: Yes    Types: Marijuana    Comment: weed- daily use for pain relief when she can get  it   Sexual activity: Not Currently  Other Topics Concern   Not on file  Social History Narrative   Not on file   Social Drivers of Health   Financial Resource Strain: Not on file  Food Insecurity: Food Insecurity Present (06/10/2022)   Hunger Vital Sign    Worried About Running Out of Food in the Last Year: Often true    Ran Out of Food in the Last Year: Often true  Transportation Needs: Unmet Transportation Needs (06/10/2022)   PRAPARE - Administrator, Civil Service (Medical): Yes    Lack of Transportation (Non-Medical): No  Physical Activity: Not on file  Stress: Not on file  Social Connections: Not on file  Intimate Partner Violence: Not At Risk (06/10/2022)   Humiliation, Afraid, Rape, and Kick questionnaire    Fear of Current or Ex-Partner: No    Emotionally Abused: No    Physically Abused: No    Sexually Abused: No    Past Surgical History:  Procedure Laterality Date   ABDOMINAL HYSTERECTOMY     CATARACT EXTRACTION W/ INTRAOCULAR LENS  IMPLANT, BILATERAL     CATARACT EXTRACTION W/PHACO Left 12/14/2021   Procedure: CATARACT EXTRACTION PHACO AND INTRAOCULAR LENS PLACEMENT (IOC) LEFT DIABETIC 24.79 02:23.9;  Surgeon: Mittie Gaskin, MD;  Location:  [facility];  Service: Ophthalmology;  Laterality: Left;  Diabetic   CHOLECYSTECTOMY     COLONOSCOPY WITH PROPOFOL  N/A 10/29/2014   Procedure: COLONOSCOPY WITH PROPOFOL ;  Surgeon:  Deward CINDERELLA Piedmont, MD;  Location: [facility];  Service: Gastroenterology;  Laterality: N/A;   ESOPHAGOGASTRODUODENOSCOPY (EGD) WITH PROPOFOL  N/A 10/29/2014   Procedure: ESOPHAGOGASTRODUODENOSCOPY (EGD) WITH PROPOFOL ;  Surgeon: Deward CINDERELLA Piedmont, MD;  Location: [facility];  Service: Gastroenterology;  Laterality: N/A;   EYE SURGERY Bilateral 2011   lid lift done 3 times   FOOT SURGERY     HAND SURGERY     TONGUE SURGERY     removal of cancer   TONSILLECTOMY     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/13/2016   Procedure: TOTAL HIP ARTHROPLASTY ANTERIOR APPROACH;  Surgeon: Ozell Flake, MD;  Location: [facility];  Service: Orthopedics;  Laterality: Right;   TOTAL KNEE ARTHROPLASTY Right 11/23/2015   Procedure: TOTAL KNEE ARTHROPLASTY;  Surgeon: Ozell Flake, MD;  Location: [facility];  Service: Orthopedics;  Laterality: Right;   VOCAL CORD LATERALIZATION, ENDOSCOPIC APPROACH W/ MLB      Family History  Problem Relation Age of Onset   Cancer Father    Heart disease Mother     No Known Allergies     Latest Ref Rng & Units 12/14/2022   10:32 AM 06/12/2022    5:49 AM 06/11/2022    6:51 AM  CBC  WBC 3.4 - 10.8 x10E3/uL 5.8  4.4  5.4   Hemoglobin 11.1 - 15.9 g/dL 9.2  89.1  88.7   Hematocrit 34.0 - 46.6 % 29.4  35.0  35.2   Platelets 150 - 450 x10E3/uL 434  309  376       CMP     Component Value Date/Time   NA 141 12/14/2022 1032   NA 141 04/09/2011 0323   K 4.1 12/14/2022 1032   K 4.1 04/09/2011 0323   CL 100 12/14/2022 1032   CL 106 04/09/2011 0323   CO2 27 12/14/2022 1032   CO2 26 04/09/2011 0323   GLUCOSE 86 12/14/2022 1032   GLUCOSE 128 (H) 06/12/2022 0549   GLUCOSE 210 (H) 04/09/2011 0323   BUN 20 12/14/2022 1032   BUN 16 04/09/2011 0323   CREATININE 1.24 (H) 12/14/2022 1032   CREATININE 0.96 04/09/2011  0323   CALCIUM 9.6 12/14/2022 1032   CALCIUM 8.2 (L) 04/09/2011 0323   PROT 6.8 12/14/2022 1032   PROT 7.2 04/08/2011 1928   ALBUMIN 4.1 12/14/2022 1032   ALBUMIN 3.9 04/08/2011 1928   AST 15 12/14/2022 1032   AST 18 04/08/2011 1928   ALT 11 12/14/2022 1032   ALT 23 04/08/2011 1928   ALKPHOS 89 12/14/2022 1032   ALKPHOS 99 04/08/2011 1928   BILITOT 0.2 12/14/2022 1032   BILITOT 0.3 04/08/2011 1928   EGFR 44 (L) 12/14/2022 1032   GFRNONAA >60 06/12/2022 0549   GFRNONAA >60 04/09/2011 0323     No results found.     Assessment & Plan:   1. Varicose veins of both lower extremities with pain (Primary) Patient presents to clinic today with bilateral lower extremity pain, swelling with varicose veins.  Patient's legs are nonswollen today but she does seem to have spider veins but is not complaining of any pain from them at this time.  Patient did undergo venous bilateral lower extremity ultrasounds which showed no reflux and no DVTs.  However she is noted to have biphasic waveforms in the distal anterior tibial artery on the right and dampened monophasic waveforms in the distal posterior tibial artery as well as an occluded anterior tibial artery.  Patient describes her symptoms as atherosclerotic disease with pain at rest  and on ambulation.  Patient is unable to ambulate more than 50 feet at a time before having to stop and sit down.  Patient's feet are cool to touch and I was unable to palpate pulses in her left DP and PT today but no specific pain to her foot.  No ulcerations or wounds noted.  No gangrene noted.  I recommend the patient be scheduled next week for arterial duplex ultrasounds of her lower extremities with ABIs.  I will see her after the ultrasounds are completed to discuss a plan going forward.  My suspicion is she has atherosclerotic disease to bilateral lower extremities left greater than right and will need an angiogram of her left lower extremity.  2. Atrial flutter,  unspecified type (HCC) Continue antiarrhythmia medications as already ordered, these medications have been reviewed and there are no changes at this time.  Continue anticoagulation as ordered by Cardiology Service  3. Essential hypertension, benign Continue antihypertensive medications as already ordered, these medications have been reviewed and there are no changes at this time.  4. Type 2 diabetes mellitus with hyperglycemia, with long-term current use of insulin  (HCC) Continue hypoglycemic medications as already ordered, these medications have been reviewed and there are no changes at this time.  Hgb A1C to be monitored as already arranged by primary service   Current Outpatient Medications on File Prior to Visit  Medication Sig Dispense Refill   Accu-Chek Softclix Lancets lancets Use with device to check sugars twice daily 100 each 12   albuterol  (VENTOLIN  HFA) 108 (90 Base) MCG/ACT inhaler Inhale 2 puffs into the lungs every 6 (six) hours as needed for wheezing or shortness of breath. 8 g 2   apixaban  (ELIQUIS ) 5 MG TABS tablet Take 1 tablet (5 mg total) by mouth 2 (two) times daily. 60 tablet 3   betamethasone , augmented, (DIPROLENE ) 0.05 % lotion Apply 1 Application topically 2 (two) times daily.     chlorthalidone (HYGROTON) 25 MG tablet TAKE 1 TABLET BY MOUTH DAILY 90 tablet 2   Cholecalciferol  (VITAMIN D3) 125 MCG (5000 UT) CAPS Take 1 capsule (5,000 Units total) by mouth daily. 90 capsule 1   diclofenac sodium (VOLTAREN) 1 % GEL Apply 2 g topically 2 (two) times daily as needed (pain).      docusate sodium  (COLACE) 100 MG capsule Take 1 capsule (100 mg total) by mouth 2 (two) times daily. 60 capsule 0   dorzolamide-timolol  (COSOPT) 2-0.5 % ophthalmic solution Place 1 drop into both eyes 2 (two) times daily.     doxepin  (SINEQUAN ) 10 MG capsule TAKE 1 CAPSULE BY MOUTH AT BEDTIME 30 capsule 0   DULoxetine  (CYMBALTA ) 60 MG capsule TAKE 1 CAPSULE BY MOUTH DAILY 90 capsule 2    esomeprazole  (NEXIUM ) 40 MG capsule Take 1 capsule (40 mg total) by mouth daily. 90 capsule 3   ferrous sulfate  325 (65 FE) MG tablet Take 325 mg by mouth daily with breakfast.     fluticasone  (FLONASE ) 50 MCG/ACT nasal spray Place 2 sprays into both nostrils daily as needed for allergies. 16 g 11   furosemide  (LASIX ) 20 MG tablet TAKE 1 TABLET BY MOUTH DAILY 90 tablet 1   glucose blood (ACCU-CHEK GUIDE TEST) test strip CHECK BLOOD SUGAR TWICE DAILY 200 each 1   hydrOXYzine  (ATARAX ) 25 MG tablet Take 25 mg by mouth at bedtime as needed (insomnia).     insulin  glargine (LANTUS  SOLOSTAR) 100 UNIT/ML Solostar Pen Inject 30 Units into the skin at bedtime. 15 mL  2   insulin  lispro (HUMALOG ) 100 UNIT/ML KwikPen Inject 10 Units into the skin 3 (three) times daily. (Patient taking differently: Inject 3-15 Units into the skin 3 (three) times daily.) 15 mL 2   Insulin  Pen Needle (BD PEN NEEDLE NANO U/F) 32G X 4 MM MISC 1 each by Does not apply route in the morning, at noon, in the evening, and at bedtime. 200 each 11   isosorbide  mononitrate (IMDUR ) 30 MG 24 hr tablet Take 1 tablet (30 mg total) by mouth daily. 90 tablet 1   lactulose  (CHRONULAC ) 10 GM/15ML solution Take 15 mLs (10 g total) by mouth 2 (two) times daily as needed for mild constipation, moderate constipation or severe constipation. 236 mL 0   losartan  (COZAAR ) 50 MG tablet TAKE 1 TABLET BY MOUTH DAILY 90 tablet 1   metFORMIN  (GLUCOPHAGE ) 500 MG tablet Take 1 tablet (500 mg total) by mouth 2 (two) times daily. 180 tablet 1   polyethylene glycol powder (GLYCOLAX /MIRALAX ) powder 2 cap fulls in a full glass of water, two times a day, for 5 days. 255 g 0   potassium chloride  SA (K-DUR,KLOR-CON ) 20 MEQ tablet Take 20 mEq by mouth daily.     pravastatin  (PRAVACHOL ) 40 MG tablet TAKE 1 TABLET BY MOUTH DAILY 90 tablet 3   prednisoLONE acetate (PRED FORTE) 1 % ophthalmic suspension Place 1 drop into the right eye 2 (two) times daily.     traMADol  (ULTRAM) 50 MG tablet TAKE 1 TABLET BY MOUTH EVERY 8 HOURS AS NEEDED 90 tablet 1   vitamin B-12 (CYANOCOBALAMIN ) 1000 MCG tablet Take 1,000 mcg by mouth daily.     No current facility-administered medications on file prior to visit.    There are no Patient Instructions on file for this visit. No follow-ups on file.   Gwendlyn JONELLE Shank, NP

## 2023-09-18 ENCOUNTER — Other Ambulatory Visit (INDEPENDENT_AMBULATORY_CARE_PROVIDER_SITE_OTHER): Payer: Self-pay | Admitting: Vascular Surgery

## 2023-09-18 DIAGNOSIS — I739 Peripheral vascular disease, unspecified: Secondary | ICD-10-CM

## 2023-09-18 DIAGNOSIS — I83813 Varicose veins of bilateral lower extremities with pain: Secondary | ICD-10-CM

## 2023-09-19 ENCOUNTER — Other Ambulatory Visit (INDEPENDENT_AMBULATORY_CARE_PROVIDER_SITE_OTHER)

## 2023-09-19 ENCOUNTER — Ambulatory Visit (INDEPENDENT_AMBULATORY_CARE_PROVIDER_SITE_OTHER)

## 2023-09-19 DIAGNOSIS — I739 Peripheral vascular disease, unspecified: Secondary | ICD-10-CM | POA: Diagnosis not present

## 2023-09-20 ENCOUNTER — Encounter (INDEPENDENT_AMBULATORY_CARE_PROVIDER_SITE_OTHER): Payer: Self-pay | Admitting: Vascular Surgery

## 2023-09-20 ENCOUNTER — Ambulatory Visit (INDEPENDENT_AMBULATORY_CARE_PROVIDER_SITE_OTHER): Admitting: Vascular Surgery

## 2023-09-20 VITALS — BP 105/60 | HR 74 | Resp 16

## 2023-09-20 DIAGNOSIS — E1165 Type 2 diabetes mellitus with hyperglycemia: Secondary | ICD-10-CM | POA: Diagnosis not present

## 2023-09-20 DIAGNOSIS — I10 Essential (primary) hypertension: Secondary | ICD-10-CM

## 2023-09-20 DIAGNOSIS — I70222 Atherosclerosis of native arteries of extremities with rest pain, left leg: Secondary | ICD-10-CM

## 2023-09-20 DIAGNOSIS — I4892 Unspecified atrial flutter: Secondary | ICD-10-CM | POA: Diagnosis not present

## 2023-09-20 DIAGNOSIS — Z794 Long term (current) use of insulin: Secondary | ICD-10-CM

## 2023-09-20 LAB — VAS US ABI WITH/WO TBI
Left ABI: 0.24
Right ABI: 0.84

## 2023-09-28 ENCOUNTER — Telehealth (INDEPENDENT_AMBULATORY_CARE_PROVIDER_SITE_OTHER): Payer: Self-pay

## 2023-09-28 NOTE — Telephone Encounter (Signed)
 I attempted to contact the patient to schedule her for bilateral leg angio's with Dr. Gilda Crease. A message was left for a return call.

## 2023-10-01 NOTE — Telephone Encounter (Signed)
 Spoke with the patient's sister and the patient is scheduled with Dr. Jama for a LLE (10/23/23 - 9:00 am), RLE (10/30/23 -6:45 am) at the [facility]. Pre-procedure instructions were discussed and will be mailed.

## 2023-10-01 NOTE — Progress Notes (Signed)
 Subjective:    Patient ID: Wanda Gardner, female    DOB: [birth year], 83 y.o.   MRN: [id number] Chief Complaint  Patient presents with   Follow-up    Follow up bilateral LE Arterial + ABI    Wanda Gardner is an 83 yo female who presents to clinic today with chief complaint of pain in her bilateral lower extremities. Patient was brought into the clinic in a wheelchair as she states the pain to her bilateral lower extremities keeps her from walking any considerable distance.  Patient's sister who is with her today at the visit states that she cannot walk more than 50 feet without having to sit down.  Patient endorses she has bilateral hip pain with bilateral lower leg pain at rest as well as when walking.  Pain is worse when she does try to walk.  She also has severe cramping to both of her legs left greater than right.  Patient endorses she has been smoking since she was 83 years old.  She denies any swelling to her legs today.  She does have a knee replacement to her right leg and needs 1 to her left leg but states they would not do both at the same time due to her diabetes.    Review of Systems  Constitutional: Negative.   Musculoskeletal:        Bilateral lower extremity leg pain at rest with inability to walk long distance.  All other systems reviewed and are negative.      Objective:   Physical Exam Constitutional:      Appearance: Normal appearance. She is obese.  HENT:     Head: Normocephalic.  Eyes:     Pupils: Pupils are equal, round, and reactive to light.  Cardiovascular:     Rate and Rhythm: Normal rate and regular rhythm.     Pulses: Normal pulses.     Heart sounds: Normal heart sounds.  Pulmonary:     Effort: Pulmonary effort is normal.     Breath sounds: Normal breath sounds.  Abdominal:     General: Bowel sounds are normal.     Palpations: Abdomen is soft.  Musculoskeletal:        General: Tenderness present.  Skin:    General: Skin is warm and dry.      Capillary Refill: Capillary refill takes 2 to 3 seconds.  Neurological:     General: No focal deficit present.     Mental Status: She is alert and oriented to person, place, and time. Mental status is at baseline.  Psychiatric:        Mood and Affect: Mood normal.        Behavior: Behavior normal.        Thought Content: Thought content normal.        Judgment: Judgment normal.     BP 105/60 (BP Location: Left Arm, Patient Position: Sitting, Cuff Size: Normal)   Pulse 74   Resp 16   Past Medical History:  Diagnosis Date   Acid reflux    AKI (acute kidney injury) (HCC) 06/10/2022   Arthritis    Asthma    Chest pain, unspecified 10/30/2013   Formatting of this note might be different from the original. Note: Unchanged Note: Unchanged Formatting of this note might be different from the original. Note: Unchanged Note: Unchanged   Constipation 04/20/2011   Formatting of this note might be different from the original. Note: Unchanged - probably [facility]. Note: Unchanged - probably  [facility].   COPD (chronic obstructive pulmonary disease) (HCC)    Cough 10/30/2013   Formatting of this note might be different from the original. Note: Unchanged Note: Unchanged   Depression    Diabetes mellitus (HCC) 01/10/2022   Diabetes mellitus without complication (HCC)    Dyspnea    Glaucoma    Headache    right side of head   Headache 07/25/2011   Hyperlipidemia    Hypertension    Knee stiff 05/12/2022   Lens replaced 03/22/2010   Neuropathic pain of hands and feet    PONV (postoperative nausea and vomiting) 1982   back with my hysterectomy.   Pseudophakia 03/22/2010   Sleep apnea    Doesn't use CPAP   Stiffness of shoulder joint 05/12/2022   Trigger finger of right hand 01/10/2022   Wears dentures    full upper and lower    Social History   Socioeconomic History   Marital status: Divorced    Spouse name: Not on file   Number of children: Not on file   Years of education: Not on file    Highest education level: Not on file  Occupational History   Not on file  Tobacco Use   Smoking status: Every Day    Current packs/day: 0.50    Average packs/day: 0.5 packs/day for 50.0 years (25.0 ttl pk-yrs)    Types: Cigarettes   Smokeless tobacco: Never  Vaping Use   Vaping status: Never Used  Substance and Sexual Activity   Alcohol use: Yes    Alcohol/week: 0.0 standard drinks of alcohol    Comment: 1 glass of wine twice a year   Drug use: Yes    Types: Marijuana    Comment: weed- daily use for pain relief when she can get  it   Sexual activity: Not Currently  Other Topics Concern   Not on file  Social History Narrative   Not on file   Social Drivers of Health   Financial Resource Strain: Not on file  Food Insecurity: Food Insecurity Present (06/10/2022)   Hunger Vital Sign    Worried About Running Out of Food in the Last Year: Often true    Ran Out of Food in the Last Year: Often true  Transportation Needs: Unmet Transportation Needs (06/10/2022)   PRAPARE - Administrator, Civil Service (Medical): Yes    Lack of Transportation (Non-Medical): No  Physical Activity: Not on file  Stress: Not on file  Social Connections: Not on file  Intimate Partner Violence: Not At Risk (06/10/2022)   Humiliation, Afraid, Rape, and Kick questionnaire    Fear of Current or Ex-Partner: No    Emotionally Abused: No    Physically Abused: No    Sexually Abused: No    Past Surgical History:  Procedure Laterality Date   ABDOMINAL HYSTERECTOMY     CATARACT EXTRACTION W/ INTRAOCULAR LENS  IMPLANT, BILATERAL     CATARACT EXTRACTION W/PHACO Left 12/14/2021   Procedure: CATARACT EXTRACTION PHACO AND INTRAOCULAR LENS PLACEMENT (IOC) LEFT DIABETIC 24.79 02:23.9;  Surgeon: Mittie Gaskin, MD;  Location: [facility];  Service: Ophthalmology;  Laterality: Left;  Diabetic   CHOLECYSTECTOMY     COLONOSCOPY WITH PROPOFOL  N/A 10/29/2014   Procedure: COLONOSCOPY WITH  PROPOFOL ;  Surgeon: Deward CINDERELLA Piedmont, MD;  Location: [facility];  Service: Gastroenterology;  Laterality: N/A;   ESOPHAGOGASTRODUODENOSCOPY (EGD) WITH PROPOFOL  N/A 10/29/2014   Procedure: ESOPHAGOGASTRODUODENOSCOPY (EGD) WITH PROPOFOL ;  Surgeon: Deward CINDERELLA Piedmont, MD;  Location: [facility] ENDOSCOPY;  Service: Gastroenterology;  Laterality: N/A;   EYE SURGERY Bilateral 2011   lid lift done 3 times   FOOT SURGERY     HAND SURGERY     TONGUE SURGERY     removal of cancer   TONSILLECTOMY     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/13/2016   Procedure: TOTAL HIP ARTHROPLASTY ANTERIOR APPROACH;  Surgeon: Ozell Flake, MD;  Location: [facility] ORS;  Service: Orthopedics;  Laterality: Right;   TOTAL KNEE ARTHROPLASTY Right 11/23/2015   Procedure: TOTAL KNEE ARTHROPLASTY;  Surgeon: Ozell Flake, MD;  Location: [facility];  Service: Orthopedics;  Laterality: Right;   VOCAL CORD LATERALIZATION, ENDOSCOPIC APPROACH W/ MLB      Family History  Problem Relation Age of Onset   Cancer Father    Heart disease Mother     No Known Allergies     Latest Ref Rng & Units 12/14/2022   10:32 AM 06/12/2022    5:49 AM 06/11/2022    6:51 AM  CBC  WBC 3.4 - 10.8 x10E3/uL 5.8  4.4  5.4   Hemoglobin 11.1 - 15.9 g/dL 9.2  89.1  88.7   Hematocrit 34.0 - 46.6 % 29.4  35.0  35.2   Platelets 150 - 450 x10E3/uL 434  309  376       CMP     Component Value Date/Time   NA 141 12/14/2022 1032   NA 141 04/09/2011 0323   K 4.1 12/14/2022 1032   K 4.1 04/09/2011 0323   CL 100 12/14/2022 1032   CL 106 04/09/2011 0323   CO2 27 12/14/2022 1032   CO2 26 04/09/2011 0323   GLUCOSE 86 12/14/2022 1032   GLUCOSE 128 (H) 06/12/2022 0549   GLUCOSE 210 (H) 04/09/2011 0323   BUN 20 12/14/2022 1032   BUN 16 04/09/2011 0323   CREATININE 1.24 (H) 12/14/2022 1032   CREATININE 0.96 04/09/2011 0323   CALCIUM 9.6 12/14/2022 1032   CALCIUM 8.2 (L) 04/09/2011 0323   PROT 6.8 12/14/2022 1032   PROT 7.2 04/08/2011 1928   ALBUMIN 4.1 12/14/2022 1032   ALBUMIN  3.9 04/08/2011 1928   AST 15 12/14/2022 1032   AST 18 04/08/2011 1928   ALT 11 12/14/2022 1032   ALT 23 04/08/2011 1928   ALKPHOS 89 12/14/2022 1032   ALKPHOS 99 04/08/2011 1928   BILITOT 0.2 12/14/2022 1032   BILITOT 0.3 04/08/2011 1928   EGFR 44 (L) 12/14/2022 1032   GFRNONAA >60 06/12/2022 0549   GFRNONAA >60 04/09/2011 0323     VAS US  ABI WITH/WO TBI Result Date: 09/20/2023  LOWER EXTREMITY DOPPLER STUDY Patient Name:  Wanda Gardner  Date of Exam:   09/19/2023 Medical Rec #: [id number]        Accession #:    [id number] Date of Birth: 10/22/1940        Patient Gender: F Patient Age:   61 years Exam Location:  [facility] Procedure:      VAS US  ABI WITH/WO TBI Referring Phys: --------------------------------------------------------------------------------  Indications: Claudication, and peripheral artery disease. High Risk Factors: Hyperlipidemia, Diabetes, current smoker.  Performing Technologist: Donnice Charnley RVT  Examination Guidelines: A complete evaluation includes at minimum, Doppler waveform signals and systolic blood pressure reading at the level of bilateral brachial, anterior tibial, and posterior tibial arteries, when vessel segments are accessible. Bilateral testing is considered an integral part of a complete examination. Photoelectric Plethysmograph (PPG) waveforms and toe systolic pressure readings are included as required  and additional duplex testing as needed. Limited examinations for reoccurring indications may be performed as noted.  ABI Findings: +---------+------------------+-----+----------+--------+ Right    Rt Pressure (mmHg)IndexWaveform  Comment  +---------+------------------+-----+----------+--------+ Brachial 148                                       +---------+------------------+-----+----------+--------+ PTA      124               0.84 monophasic         +---------+------------------+-----+----------+--------+ DP       116                0.78 monophasic         +---------+------------------+-----+----------+--------+ Great Toe69                0.47                    +---------+------------------+-----+----------+--------+ +---------+------------------+-----+----------+-------+ Left     Lt Pressure (mmHg)IndexWaveform  Comment +---------+------------------+-----+----------+-------+ Brachial 145                                      +---------+------------------+-----+----------+-------+ PTA      35                0.24 monophasic        +---------+------------------+-----+----------+-------+ PERO                            absent            +---------+------------------+-----+----------+-------+ DP       0                 0.00 absent            +---------+------------------+-----+----------+-------+ Great Toe0                 0.00                   +---------+------------------+-----+----------+-------+ +-------+-----------+-----------+------------+------------+ ABI/TBIToday's ABIToday's TBIPrevious ABIPrevious TBI +-------+-----------+-----------+------------+------------+ Right  0.84       0.47                                +-------+-----------+-----------+------------+------------+ Left   0.24       0.00                                +-------+-----------+-----------+------------+------------+   Summary: Right: Resting right ankle-brachial index indicates mild right lower extremity arterial disease. The right toe-brachial index is abnormal. 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 The left toe-brachial index is abnormal. *See table(s) above for measurements and observations.  Electronically signed by Selinda Gu MD on 09/20/2023 at 8:24:54 AM.    Final        Assessment & Plan:   1. 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  Patient is noted to have the following:   Right ABI today 0.84 with monophasic pulses. Left ABI today is 0.24 with 0.0 TBI monophasic pulses in the  PTA no pulses PT and DP.  Vascular surgery recommends left lower extremity angiogram with possible intervention due to results from arterial duplex ultrasounds of bilateral lower extremities.  This explains her leg pain with her inability to walk any distance.  I had a long detailed discussion with the patient today regarding the procedure, benefits, risk, complications.  Patient verbalizes her understanding as well as her sister who is with her at the visit today.  Patient was instructed we will call with an appointment for her to have an outpatient procedure done at the vein and vascular clinic here at [facility].  2. Essential hypertension, benign Continue antihypertensive medications as already ordered, these medications have been reviewed and there are no changes at this time.  3. Atrial flutter, unspecified type (HCC) Continue antiarrhythmia medications as already ordered, these medications have been reviewed and there are no changes at this time.  Continue anticoagulation as ordered by Cardiology Service  4. Type 2 diabetes mellitus with hyperglycemia, with long-term current use of insulin  (HCC) Continue hypoglycemic medications as already ordered, these medications have been reviewed and there are no changes at this time.  Hgb A1C to be monitored as already arranged by primary service   Current Outpatient Medications on File Prior to Visit  Medication Sig Dispense Refill   Accu-Chek Softclix Lancets lancets Use with device to check sugars twice daily 100 each 12   albuterol  (VENTOLIN  HFA) 108 (90 Base) MCG/ACT inhaler Inhale 2 puffs into the lungs every 6 (six) hours as needed for wheezing or shortness of breath. 8 g 2   apixaban  (ELIQUIS ) 5 MG TABS tablet Take 1 tablet (5  mg total) by mouth 2 (two) times daily. 60 tablet 3   betamethasone , augmented, (DIPROLENE ) 0.05 % lotion Apply 1 Application topically 2 (two) times daily.     chlorthalidone (HYGROTON) 25 MG tablet TAKE 1 TABLET BY MOUTH DAILY 90 tablet 2   Cholecalciferol  (VITAMIN D3) 125 MCG (5000 UT) CAPS Take 1 capsule (5,000 Units total) by mouth daily. 90 capsule 1   diclofenac sodium (VOLTAREN) 1 % GEL Apply 2 g topically 2 (two) times daily as needed (pain).      docusate sodium  (COLACE) 100 MG capsule Take 1 capsule (100 mg total) by mouth 2 (two) times daily. 60 capsule 0   dorzolamide-timolol  (COSOPT) 2-0.5 % ophthalmic solution Place 1 drop into both eyes 2 (two) times daily.     doxepin  (SINEQUAN ) 10 MG capsule TAKE 1 CAPSULE BY MOUTH AT BEDTIME 30 capsule 0   DULoxetine  (CYMBALTA ) 60 MG capsule TAKE 1 CAPSULE BY MOUTH DAILY 90 capsule 2   esomeprazole  (NEXIUM ) 40 MG capsule Take 1 capsule (40 mg total) by mouth daily. 90 capsule 3   ferrous sulfate  325 (65 FE) MG tablet Take 325 mg by mouth daily with breakfast.     fluticasone  (FLONASE ) 50 MCG/ACT nasal spray Place 2 sprays into both nostrils daily as needed for allergies. 16 g 11   furosemide  (LASIX ) 20 MG tablet TAKE 1 TABLET BY MOUTH DAILY 90 tablet 1   glucose blood (ACCU-CHEK GUIDE TEST) test strip CHECK BLOOD SUGAR TWICE DAILY 200 each 1   hydrOXYzine  (ATARAX ) 25 MG tablet Take 25 mg by mouth at bedtime as needed (insomnia).     insulin  glargine (LANTUS  SOLOSTAR) 100 UNIT/ML Solostar Pen Inject 30 Units into the skin at bedtime. 15 mL 2   insulin  lispro (HUMALOG ) 100 UNIT/ML KwikPen Inject 10 Units into the skin 3 (three)  times daily. (Patient taking differently: Inject 3-15 Units into the skin 3 (three) times daily.) 15 mL 2   Insulin  Pen Needle (BD PEN NEEDLE NANO U/F) 32G X 4 MM MISC 1 each by Does not apply route in the morning, at noon, in the evening, and at bedtime. 200 each 11   isosorbide  mononitrate (IMDUR ) 30 MG 24 hr tablet Take 1  tablet (30 mg total) by mouth daily. 90 tablet 1   lactulose  (CHRONULAC ) 10 GM/15ML solution Take 15 mLs (10 g total) by mouth 2 (two) times daily as needed for mild constipation, moderate constipation or severe constipation. 236 mL 0   losartan  (COZAAR ) 50 MG tablet TAKE 1 TABLET BY MOUTH DAILY 90 tablet 1   metFORMIN  (GLUCOPHAGE ) 500 MG tablet Take 1 tablet (500 mg total) by mouth 2 (two) times daily. 180 tablet 1   polyethylene glycol powder (GLYCOLAX /MIRALAX ) powder 2 cap fulls in a full glass of water, two times a day, for 5 days. 255 g 0   potassium chloride  SA (K-DUR,KLOR-CON ) 20 MEQ tablet Take 20 mEq by mouth daily.     pravastatin  (PRAVACHOL ) 40 MG tablet TAKE 1 TABLET BY MOUTH DAILY 90 tablet 3   prednisoLONE acetate (PRED FORTE) 1 % ophthalmic suspension Place 1 drop into the right eye 2 (two) times daily.     traMADol (ULTRAM) 50 MG tablet TAKE 1 TABLET BY MOUTH EVERY 8 HOURS AS NEEDED 90 tablet 1   vitamin B-12 (CYANOCOBALAMIN ) 1000 MCG tablet Take 1,000 mcg by mouth daily.     No current facility-administered medications on file prior to visit.    There are no Patient Instructions on file for this visit. No follow-ups on file.   Gwendlyn JONELLE Shank, NP

## 2023-10-01 NOTE — H&P (View-Only) (Signed)
 Subjective:    Patient ID: Wanda Gardner, female    DOB: [birth year], 83 y.o.   MRN: [id number] Chief Complaint  Patient presents with   Follow-up    Follow up bilateral LE Arterial + ABI    Wanda Gardner is an 83 yo female who presents to clinic today with chief complaint of pain in her bilateral lower extremities. Patient was brought into the clinic in a wheelchair as she states the pain to her bilateral lower extremities keeps her from walking any considerable distance.  Patient's sister who is with her today at the visit states that she cannot walk more than 50 feet without having to sit down.  Patient endorses she has bilateral hip pain with bilateral lower leg pain at rest as well as when walking.  Pain is worse when she does try to walk.  She also has severe cramping to both of her legs left greater than right.  Patient endorses she has been smoking since she was 83 years old.  She denies any swelling to her legs today.  She does have a knee replacement to her right leg and needs 1 to her left leg but states they would not do both at the same time due to her diabetes.    Review of Systems  Constitutional: Negative.   Musculoskeletal:        Bilateral lower extremity leg pain at rest with inability to walk long distance.  All other systems reviewed and are negative.      Objective:   Physical Exam Constitutional:      Appearance: Normal appearance. She is obese.  HENT:     Head: Normocephalic.  Eyes:     Pupils: Pupils are equal, round, and reactive to light.  Cardiovascular:     Rate and Rhythm: Normal rate and regular rhythm.     Pulses: Normal pulses.     Heart sounds: Normal heart sounds.  Pulmonary:     Effort: Pulmonary effort is normal.     Breath sounds: Normal breath sounds.  Abdominal:     General: Bowel sounds are normal.     Palpations: Abdomen is soft.  Musculoskeletal:        General: Tenderness present.  Skin:    General: Skin is warm and dry.      Capillary Refill: Capillary refill takes 2 to 3 seconds.  Neurological:     General: No focal deficit present.     Mental Status: She is alert and oriented to person, place, and time. Mental status is at baseline.  Psychiatric:        Mood and Affect: Mood normal.        Behavior: Behavior normal.        Thought Content: Thought content normal.        Judgment: Judgment normal.     BP 105/60 (BP Location: Left Arm, Patient Position: Sitting, Cuff Size: Normal)   Pulse 74   Resp 16   Past Medical History:  Diagnosis Date   Acid reflux    AKI (acute kidney injury) (HCC) 06/10/2022   Arthritis    Asthma    Chest pain, unspecified 10/30/2013   Formatting of this note might be different from the original. Note: Unchanged Note: Unchanged Formatting of this note might be different from the original. Note: Unchanged Note: Unchanged   Constipation 04/20/2011   Formatting of this note might be different from the original. Note: Unchanged - probably [facility]. Note: Unchanged - probably  [facility].   COPD (chronic obstructive pulmonary disease) (HCC)    Cough 10/30/2013   Formatting of this note might be different from the original. Note: Unchanged Note: Unchanged   Depression    Diabetes mellitus (HCC) 01/10/2022   Diabetes mellitus without complication (HCC)    Dyspnea    Glaucoma    Headache    right side of head   Headache 07/25/2011   Hyperlipidemia    Hypertension    Knee stiff 05/12/2022   Lens replaced 03/22/2010   Neuropathic pain of hands and feet    PONV (postoperative nausea and vomiting) 1982   back with my hysterectomy.   Pseudophakia 03/22/2010   Sleep apnea    Doesn't use CPAP   Stiffness of shoulder joint 05/12/2022   Trigger finger of right hand 01/10/2022   Wears dentures    full upper and lower    Social History   Socioeconomic History   Marital status: Divorced    Spouse name: Not on file   Number of children: Not on file   Years of education: Not on file    Highest education level: Not on file  Occupational History   Not on file  Tobacco Use   Smoking status: Every Day    Current packs/day: 0.50    Average packs/day: 0.5 packs/day for 50.0 years (25.0 ttl pk-yrs)    Types: Cigarettes   Smokeless tobacco: Never  Vaping Use   Vaping status: Never Used  Substance and Sexual Activity   Alcohol use: Yes    Alcohol/week: 0.0 standard drinks of alcohol    Comment: 1 glass of wine twice a year   Drug use: Yes    Types: Marijuana    Comment: weed- daily use for pain relief when she can get  it   Sexual activity: Not Currently  Other Topics Concern   Not on file  Social History Narrative   Not on file   Social Drivers of Health   Financial Resource Strain: Not on file  Food Insecurity: Food Insecurity Present (06/10/2022)   Hunger Vital Sign    Worried About Running Out of Food in the Last Year: Often true    Ran Out of Food in the Last Year: Often true  Transportation Needs: Unmet Transportation Needs (06/10/2022)   PRAPARE - Administrator, Civil Service (Medical): Yes    Lack of Transportation (Non-Medical): No  Physical Activity: Not on file  Stress: Not on file  Social Connections: Not on file  Intimate Partner Violence: Not At Risk (06/10/2022)   Humiliation, Afraid, Rape, and Kick questionnaire    Fear of Current or Ex-Partner: No    Emotionally Abused: No    Physically Abused: No    Sexually Abused: No    Past Surgical History:  Procedure Laterality Date   ABDOMINAL HYSTERECTOMY     CATARACT EXTRACTION W/ INTRAOCULAR LENS  IMPLANT, BILATERAL     CATARACT EXTRACTION W/PHACO Left 12/14/2021   Procedure: CATARACT EXTRACTION PHACO AND INTRAOCULAR LENS PLACEMENT (IOC) LEFT DIABETIC 24.79 02:23.9;  Surgeon: Mittie Gaskin, MD;  Location: [facility];  Service: Ophthalmology;  Laterality: Left;  Diabetic   CHOLECYSTECTOMY     COLONOSCOPY WITH PROPOFOL  N/A 10/29/2014   Procedure: COLONOSCOPY WITH  PROPOFOL ;  Surgeon: Deward CINDERELLA Piedmont, MD;  Location: [facility];  Service: Gastroenterology;  Laterality: N/A;   ESOPHAGOGASTRODUODENOSCOPY (EGD) WITH PROPOFOL  N/A 10/29/2014   Procedure: ESOPHAGOGASTRODUODENOSCOPY (EGD) WITH PROPOFOL ;  Surgeon: Deward CINDERELLA Piedmont, MD;  Location: [facility] ENDOSCOPY;  Service: Gastroenterology;  Laterality: N/A;   EYE SURGERY Bilateral 2011   lid lift done 3 times   FOOT SURGERY     HAND SURGERY     TONGUE SURGERY     removal of cancer   TONSILLECTOMY     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/13/2016   Procedure: TOTAL HIP ARTHROPLASTY ANTERIOR APPROACH;  Surgeon: Ozell Flake, MD;  Location: [facility] ORS;  Service: Orthopedics;  Laterality: Right;   TOTAL KNEE ARTHROPLASTY Right 11/23/2015   Procedure: TOTAL KNEE ARTHROPLASTY;  Surgeon: Ozell Flake, MD;  Location: [facility];  Service: Orthopedics;  Laterality: Right;   VOCAL CORD LATERALIZATION, ENDOSCOPIC APPROACH W/ MLB      Family History  Problem Relation Age of Onset   Cancer Father    Heart disease Mother     No Known Allergies     Latest Ref Rng & Units 12/14/2022   10:32 AM 06/12/2022    5:49 AM 06/11/2022    6:51 AM  CBC  WBC 3.4 - 10.8 x10E3/uL 5.8  4.4  5.4   Hemoglobin 11.1 - 15.9 g/dL 9.2  89.1  88.7   Hematocrit 34.0 - 46.6 % 29.4  35.0  35.2   Platelets 150 - 450 x10E3/uL 434  309  376       CMP     Component Value Date/Time   NA 141 12/14/2022 1032   NA 141 04/09/2011 0323   K 4.1 12/14/2022 1032   K 4.1 04/09/2011 0323   CL 100 12/14/2022 1032   CL 106 04/09/2011 0323   CO2 27 12/14/2022 1032   CO2 26 04/09/2011 0323   GLUCOSE 86 12/14/2022 1032   GLUCOSE 128 (H) 06/12/2022 0549   GLUCOSE 210 (H) 04/09/2011 0323   BUN 20 12/14/2022 1032   BUN 16 04/09/2011 0323   CREATININE 1.24 (H) 12/14/2022 1032   CREATININE 0.96 04/09/2011 0323   CALCIUM 9.6 12/14/2022 1032   CALCIUM 8.2 (L) 04/09/2011 0323   PROT 6.8 12/14/2022 1032   PROT 7.2 04/08/2011 1928   ALBUMIN 4.1 12/14/2022 1032   ALBUMIN  3.9 04/08/2011 1928   AST 15 12/14/2022 1032   AST 18 04/08/2011 1928   ALT 11 12/14/2022 1032   ALT 23 04/08/2011 1928   ALKPHOS 89 12/14/2022 1032   ALKPHOS 99 04/08/2011 1928   BILITOT 0.2 12/14/2022 1032   BILITOT 0.3 04/08/2011 1928   EGFR 44 (L) 12/14/2022 1032   GFRNONAA >60 06/12/2022 0549   GFRNONAA >60 04/09/2011 0323     VAS US  ABI WITH/WO TBI Result Date: 09/20/2023  LOWER EXTREMITY DOPPLER STUDY Patient Name:  CECILLE MCCLUSKY  Date of Exam:   09/19/2023 Medical Rec #: [id number]        Accession #:    [id number] Date of Birth: 10/22/1940        Patient Gender: F Patient Age:   61 years Exam Location:  [facility] Procedure:      VAS US  ABI WITH/WO TBI Referring Phys: --------------------------------------------------------------------------------  Indications: Claudication, and peripheral artery disease. High Risk Factors: Hyperlipidemia, Diabetes, current smoker.  Performing Technologist: Donnice Charnley RVT  Examination Guidelines: A complete evaluation includes at minimum, Doppler waveform signals and systolic blood pressure reading at the level of bilateral brachial, anterior tibial, and posterior tibial arteries, when vessel segments are accessible. Bilateral testing is considered an integral part of a complete examination. Photoelectric Plethysmograph (PPG) waveforms and toe systolic pressure readings are included as required  and additional duplex testing as needed. Limited examinations for reoccurring indications may be performed as noted.  ABI Findings: +---------+------------------+-----+----------+--------+ Right    Rt Pressure (mmHg)IndexWaveform  Comment  +---------+------------------+-----+----------+--------+ Brachial 148                                       +---------+------------------+-----+----------+--------+ PTA      124               0.84 monophasic         +---------+------------------+-----+----------+--------+ DP       116                0.78 monophasic         +---------+------------------+-----+----------+--------+ Great Toe69                0.47                    +---------+------------------+-----+----------+--------+ +---------+------------------+-----+----------+-------+ Left     Lt Pressure (mmHg)IndexWaveform  Comment +---------+------------------+-----+----------+-------+ Brachial 145                                      +---------+------------------+-----+----------+-------+ PTA      35                0.24 monophasic        +---------+------------------+-----+----------+-------+ PERO                            absent            +---------+------------------+-----+----------+-------+ DP       0                 0.00 absent            +---------+------------------+-----+----------+-------+ Great Toe0                 0.00                   +---------+------------------+-----+----------+-------+ +-------+-----------+-----------+------------+------------+ ABI/TBIToday's ABIToday's TBIPrevious ABIPrevious TBI +-------+-----------+-----------+------------+------------+ Right  0.84       0.47                                +-------+-----------+-----------+------------+------------+ Left   0.24       0.00                                +-------+-----------+-----------+------------+------------+   Summary: Right: Resting right ankle-brachial index indicates mild right lower extremity arterial disease. The right toe-brachial index is abnormal. 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 The left toe-brachial index is abnormal. *See table(s) above for measurements and observations.  Electronically signed by Selinda Gu MD on 09/20/2023 at 8:24:54 AM.    Final        Assessment & Plan:   1. 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  Patient is noted to have the following:   Right ABI today 0.84 with monophasic pulses. Left ABI today is 0.24 with 0.0 TBI monophasic pulses in the  PTA no pulses PT and DP.  Vascular surgery recommends left lower extremity angiogram with possible intervention due to results from arterial duplex ultrasounds of bilateral lower extremities.  This explains her leg pain with her inability to walk any distance.  I had a long detailed discussion with the patient today regarding the procedure, benefits, risk, complications.  Patient verbalizes her understanding as well as her sister who is with her at the visit today.  Patient was instructed we will call with an appointment for her to have an outpatient procedure done at the vein and vascular clinic here at [facility].  2. Essential hypertension, benign Continue antihypertensive medications as already ordered, these medications have been reviewed and there are no changes at this time.  3. Atrial flutter, unspecified type (HCC) Continue antiarrhythmia medications as already ordered, these medications have been reviewed and there are no changes at this time.  Continue anticoagulation as ordered by Cardiology Service  4. Type 2 diabetes mellitus with hyperglycemia, with long-term current use of insulin  (HCC) Continue hypoglycemic medications as already ordered, these medications have been reviewed and there are no changes at this time.  Hgb A1C to be monitored as already arranged by primary service   Current Outpatient Medications on File Prior to Visit  Medication Sig Dispense Refill   Accu-Chek Softclix Lancets lancets Use with device to check sugars twice daily 100 each 12   albuterol  (VENTOLIN  HFA) 108 (90 Base) MCG/ACT inhaler Inhale 2 puffs into the lungs every 6 (six) hours as needed for wheezing or shortness of breath. 8 g 2   apixaban  (ELIQUIS ) 5 MG TABS tablet Take 1 tablet (5  mg total) by mouth 2 (two) times daily. 60 tablet 3   betamethasone , augmented, (DIPROLENE ) 0.05 % lotion Apply 1 Application topically 2 (two) times daily.     chlorthalidone (HYGROTON) 25 MG tablet TAKE 1 TABLET BY MOUTH DAILY 90 tablet 2   Cholecalciferol  (VITAMIN D3) 125 MCG (5000 UT) CAPS Take 1 capsule (5,000 Units total) by mouth daily. 90 capsule 1   diclofenac sodium (VOLTAREN) 1 % GEL Apply 2 g topically 2 (two) times daily as needed (pain).      docusate sodium  (COLACE) 100 MG capsule Take 1 capsule (100 mg total) by mouth 2 (two) times daily. 60 capsule 0   dorzolamide-timolol  (COSOPT) 2-0.5 % ophthalmic solution Place 1 drop into both eyes 2 (two) times daily.     doxepin  (SINEQUAN ) 10 MG capsule TAKE 1 CAPSULE BY MOUTH AT BEDTIME 30 capsule 0   DULoxetine  (CYMBALTA ) 60 MG capsule TAKE 1 CAPSULE BY MOUTH DAILY 90 capsule 2   esomeprazole  (NEXIUM ) 40 MG capsule Take 1 capsule (40 mg total) by mouth daily. 90 capsule 3   ferrous sulfate  325 (65 FE) MG tablet Take 325 mg by mouth daily with breakfast.     fluticasone  (FLONASE ) 50 MCG/ACT nasal spray Place 2 sprays into both nostrils daily as needed for allergies. 16 g 11   furosemide  (LASIX ) 20 MG tablet TAKE 1 TABLET BY MOUTH DAILY 90 tablet 1   glucose blood (ACCU-CHEK GUIDE TEST) test strip CHECK BLOOD SUGAR TWICE DAILY 200 each 1   hydrOXYzine  (ATARAX ) 25 MG tablet Take 25 mg by mouth at bedtime as needed (insomnia).     insulin  glargine (LANTUS  SOLOSTAR) 100 UNIT/ML Solostar Pen Inject 30 Units into the skin at bedtime. 15 mL 2   insulin  lispro (HUMALOG ) 100 UNIT/ML KwikPen Inject 10 Units into the skin 3 (three)  times daily. (Patient taking differently: Inject 3-15 Units into the skin 3 (three) times daily.) 15 mL 2   Insulin  Pen Needle (BD PEN NEEDLE NANO U/F) 32G X 4 MM MISC 1 each by Does not apply route in the morning, at noon, in the evening, and at bedtime. 200 each 11   isosorbide  mononitrate (IMDUR ) 30 MG 24 hr tablet Take 1  tablet (30 mg total) by mouth daily. 90 tablet 1   lactulose  (CHRONULAC ) 10 GM/15ML solution Take 15 mLs (10 g total) by mouth 2 (two) times daily as needed for mild constipation, moderate constipation or severe constipation. 236 mL 0   losartan  (COZAAR ) 50 MG tablet TAKE 1 TABLET BY MOUTH DAILY 90 tablet 1   metFORMIN  (GLUCOPHAGE ) 500 MG tablet Take 1 tablet (500 mg total) by mouth 2 (two) times daily. 180 tablet 1   polyethylene glycol powder (GLYCOLAX /MIRALAX ) powder 2 cap fulls in a full glass of water, two times a day, for 5 days. 255 g 0   potassium chloride  SA (K-DUR,KLOR-CON ) 20 MEQ tablet Take 20 mEq by mouth daily.     pravastatin  (PRAVACHOL ) 40 MG tablet TAKE 1 TABLET BY MOUTH DAILY 90 tablet 3   prednisoLONE acetate (PRED FORTE) 1 % ophthalmic suspension Place 1 drop into the right eye 2 (two) times daily.     traMADol (ULTRAM) 50 MG tablet TAKE 1 TABLET BY MOUTH EVERY 8 HOURS AS NEEDED 90 tablet 1   vitamin B-12 (CYANOCOBALAMIN ) 1000 MCG tablet Take 1,000 mcg by mouth daily.     No current facility-administered medications on file prior to visit.    There are no Patient Instructions on file for this visit. No follow-ups on file.   Gwendlyn JONELLE Shank, NP

## 2023-10-05 DIAGNOSIS — N3281 Overactive bladder: Secondary | ICD-10-CM | POA: Diagnosis not present

## 2023-10-15 ENCOUNTER — Emergency Department

## 2023-10-15 ENCOUNTER — Inpatient Hospital Stay
Admission: EM | Admit: 2023-10-15 | Discharge: 2023-10-20 | DRG: 253 | Disposition: A | Discharge status: Home Health | Attending: Internal Medicine | Admitting: Internal Medicine

## 2023-10-15 ENCOUNTER — Other Ambulatory Visit: Payer: Self-pay

## 2023-10-15 ENCOUNTER — Encounter: Payer: Self-pay | Admitting: Family Medicine

## 2023-10-15 DIAGNOSIS — R55 Syncope and collapse: Principal | ICD-10-CM

## 2023-10-15 DIAGNOSIS — G4700 Insomnia, unspecified: Secondary | ICD-10-CM | POA: Diagnosis present

## 2023-10-15 DIAGNOSIS — R404 Transient alteration of awareness: Secondary | ICD-10-CM | POA: Diagnosis not present

## 2023-10-15 DIAGNOSIS — Z5941 Food insecurity: Secondary | ICD-10-CM

## 2023-10-15 DIAGNOSIS — Z90710 Acquired absence of both cervix and uterus: Secondary | ICD-10-CM

## 2023-10-15 DIAGNOSIS — Z9841 Cataract extraction status, right eye: Secondary | ICD-10-CM

## 2023-10-15 DIAGNOSIS — Z8249 Family history of ischemic heart disease and other diseases of the circulatory system: Secondary | ICD-10-CM

## 2023-10-15 DIAGNOSIS — Z7901 Long term (current) use of anticoagulants: Secondary | ICD-10-CM

## 2023-10-15 DIAGNOSIS — Z794 Long term (current) use of insulin: Secondary | ICD-10-CM | POA: Diagnosis not present

## 2023-10-15 DIAGNOSIS — Z96651 Presence of right artificial knee joint: Secondary | ICD-10-CM | POA: Diagnosis present

## 2023-10-15 DIAGNOSIS — E1122 Type 2 diabetes mellitus with diabetic chronic kidney disease: Secondary | ICD-10-CM | POA: Diagnosis present

## 2023-10-15 DIAGNOSIS — I70223 Atherosclerosis of native arteries of extremities with rest pain, bilateral legs: Secondary | ICD-10-CM | POA: Diagnosis present

## 2023-10-15 DIAGNOSIS — B9620 Unspecified Escherichia coli [E. coli] as the cause of diseases classified elsewhere: Secondary | ICD-10-CM | POA: Diagnosis present

## 2023-10-15 DIAGNOSIS — E1151 Type 2 diabetes mellitus with diabetic peripheral angiopathy without gangrene: Secondary | ICD-10-CM | POA: Diagnosis present

## 2023-10-15 DIAGNOSIS — Z7984 Long term (current) use of oral hypoglycemic drugs: Secondary | ICD-10-CM | POA: Diagnosis not present

## 2023-10-15 DIAGNOSIS — E162 Hypoglycemia, unspecified: Secondary | ICD-10-CM | POA: Diagnosis present

## 2023-10-15 DIAGNOSIS — D62 Acute posthemorrhagic anemia: Secondary | ICD-10-CM | POA: Diagnosis present

## 2023-10-15 DIAGNOSIS — I4892 Unspecified atrial flutter: Secondary | ICD-10-CM | POA: Diagnosis present

## 2023-10-15 DIAGNOSIS — Z961 Presence of intraocular lens: Secondary | ICD-10-CM | POA: Diagnosis present

## 2023-10-15 DIAGNOSIS — E785 Hyperlipidemia, unspecified: Secondary | ICD-10-CM | POA: Diagnosis present

## 2023-10-15 DIAGNOSIS — D509 Iron deficiency anemia, unspecified: Secondary | ICD-10-CM | POA: Diagnosis present

## 2023-10-15 DIAGNOSIS — G8929 Other chronic pain: Secondary | ICD-10-CM | POA: Diagnosis present

## 2023-10-15 DIAGNOSIS — Z720 Tobacco use: Secondary | ICD-10-CM | POA: Diagnosis not present

## 2023-10-15 DIAGNOSIS — Z66 Do not resuscitate: Secondary | ICD-10-CM | POA: Diagnosis present

## 2023-10-15 DIAGNOSIS — I700 Atherosclerosis of aorta: Secondary | ICD-10-CM | POA: Diagnosis not present

## 2023-10-15 DIAGNOSIS — R609 Edema, unspecified: Secondary | ICD-10-CM | POA: Diagnosis not present

## 2023-10-15 DIAGNOSIS — N390 Urinary tract infection, site not specified: Secondary | ICD-10-CM | POA: Diagnosis present

## 2023-10-15 DIAGNOSIS — E1165 Type 2 diabetes mellitus with hyperglycemia: Secondary | ICD-10-CM | POA: Diagnosis present

## 2023-10-15 DIAGNOSIS — N1831 Chronic kidney disease, stage 3a: Secondary | ICD-10-CM | POA: Diagnosis present

## 2023-10-15 DIAGNOSIS — J4489 Other specified chronic obstructive pulmonary disease: Secondary | ICD-10-CM | POA: Diagnosis present

## 2023-10-15 DIAGNOSIS — M79672 Pain in left foot: Secondary | ICD-10-CM

## 2023-10-15 DIAGNOSIS — I129 Hypertensive chronic kidney disease with stage 1 through stage 4 chronic kidney disease, or unspecified chronic kidney disease: Secondary | ICD-10-CM | POA: Diagnosis present

## 2023-10-15 DIAGNOSIS — R4781 Slurred speech: Secondary | ICD-10-CM | POA: Diagnosis not present

## 2023-10-15 DIAGNOSIS — F17210 Nicotine dependence, cigarettes, uncomplicated: Secondary | ICD-10-CM | POA: Diagnosis present

## 2023-10-15 DIAGNOSIS — H109 Unspecified conjunctivitis: Secondary | ICD-10-CM | POA: Diagnosis not present

## 2023-10-15 DIAGNOSIS — Z96641 Presence of right artificial hip joint: Secondary | ICD-10-CM | POA: Diagnosis present

## 2023-10-15 DIAGNOSIS — I70222 Atherosclerosis of native arteries of extremities with rest pain, left leg: Secondary | ICD-10-CM

## 2023-10-15 DIAGNOSIS — Z9049 Acquired absence of other specified parts of digestive tract: Secondary | ICD-10-CM

## 2023-10-15 DIAGNOSIS — Z9842 Cataract extraction status, left eye: Secondary | ICD-10-CM

## 2023-10-15 DIAGNOSIS — Z5982 Transportation insecurity: Secondary | ICD-10-CM

## 2023-10-15 DIAGNOSIS — I70229 Atherosclerosis of native arteries of extremities with rest pain, unspecified extremity: Secondary | ICD-10-CM

## 2023-10-15 DIAGNOSIS — Z86718 Personal history of other venous thrombosis and embolism: Secondary | ICD-10-CM

## 2023-10-15 DIAGNOSIS — I998 Other disorder of circulatory system: Secondary | ICD-10-CM | POA: Diagnosis not present

## 2023-10-15 DIAGNOSIS — Z23 Encounter for immunization: Secondary | ICD-10-CM | POA: Diagnosis present

## 2023-10-15 DIAGNOSIS — Z5948 Other specified lack of adequate food: Secondary | ICD-10-CM

## 2023-10-15 DIAGNOSIS — Z043 Encounter for examination and observation following other accident: Secondary | ICD-10-CM | POA: Diagnosis not present

## 2023-10-15 DIAGNOSIS — R0989 Other specified symptoms and signs involving the circulatory and respiratory systems: Secondary | ICD-10-CM | POA: Diagnosis not present

## 2023-10-15 DIAGNOSIS — M1612 Unilateral primary osteoarthritis, left hip: Secondary | ICD-10-CM | POA: Diagnosis not present

## 2023-10-15 DIAGNOSIS — E876 Hypokalemia: Secondary | ICD-10-CM | POA: Insufficient documentation

## 2023-10-15 DIAGNOSIS — M1990 Unspecified osteoarthritis, unspecified site: Secondary | ICD-10-CM | POA: Diagnosis present

## 2023-10-15 DIAGNOSIS — I4891 Unspecified atrial fibrillation: Secondary | ICD-10-CM | POA: Insufficient documentation

## 2023-10-15 DIAGNOSIS — H409 Unspecified glaucoma: Secondary | ICD-10-CM | POA: Diagnosis present

## 2023-10-15 DIAGNOSIS — I70201 Unspecified atherosclerosis of native arteries of extremities, right leg: Secondary | ICD-10-CM | POA: Diagnosis not present

## 2023-10-15 DIAGNOSIS — Z79899 Other long term (current) drug therapy: Secondary | ICD-10-CM

## 2023-10-15 DIAGNOSIS — H1031 Unspecified acute conjunctivitis, right eye: Secondary | ICD-10-CM | POA: Diagnosis not present

## 2023-10-15 DIAGNOSIS — I10 Essential (primary) hypertension: Secondary | ICD-10-CM | POA: Insufficient documentation

## 2023-10-15 DIAGNOSIS — Z809 Family history of malignant neoplasm, unspecified: Secondary | ICD-10-CM

## 2023-10-15 DIAGNOSIS — H57813 Brow ptosis, bilateral: Secondary | ICD-10-CM | POA: Diagnosis not present

## 2023-10-15 DIAGNOSIS — E11649 Type 2 diabetes mellitus with hypoglycemia without coma: Secondary | ICD-10-CM | POA: Diagnosis not present

## 2023-10-15 DIAGNOSIS — I6523 Occlusion and stenosis of bilateral carotid arteries: Secondary | ICD-10-CM | POA: Diagnosis not present

## 2023-10-15 DIAGNOSIS — I493 Ventricular premature depolarization: Secondary | ICD-10-CM | POA: Diagnosis not present

## 2023-10-15 LAB — URINALYSIS, ROUTINE W REFLEX MICROSCOPIC
Bilirubin Urine: NEGATIVE
Glucose, UA: 50 mg/dL — AB
Hgb urine dipstick: NEGATIVE
Ketones, ur: NEGATIVE mg/dL
Nitrite: POSITIVE — AB
Protein, ur: NEGATIVE mg/dL
Specific Gravity, Urine: 1.004 — ABNORMAL LOW (ref 1.005–1.030)
Squamous Epithelial / HPF: 0 /HPF (ref 0–5)
pH: 6 (ref 5.0–8.0)

## 2023-10-15 LAB — COMPREHENSIVE METABOLIC PANEL WITH GFR
ALT: 12 U/L (ref 0–44)
AST: 18 U/L (ref 15–41)
Albumin: 3.2 g/dL — ABNORMAL LOW (ref 3.5–5.0)
Alkaline Phosphatase: 63 U/L (ref 38–126)
Anion gap: 9 (ref 5–15)
BUN: 14 mg/dL (ref 8–23)
CO2: 25 mmol/L (ref 22–32)
Calcium: 8.5 mg/dL — ABNORMAL LOW (ref 8.9–10.3)
Chloride: 105 mmol/L (ref 98–111)
Creatinine, Ser: 0.97 mg/dL (ref 0.44–1.00)
GFR, Estimated: 59 mL/min — ABNORMAL LOW (ref >60)
Glucose, Bld: 164 mg/dL — ABNORMAL HIGH (ref 70–99)
Potassium: 4 mmol/L (ref 3.5–5.1)
Sodium: 139 mmol/L (ref 135–145)
Total Bilirubin: 0.4 mg/dL (ref 0.0–1.2)
Total Protein: 6.5 g/dL (ref 6.5–8.1)

## 2023-10-15 LAB — CBC
HCT: 27.6 % — ABNORMAL LOW (ref 36.0–46.0)
Hemoglobin: 8.2 g/dL — ABNORMAL LOW (ref 12.0–15.0)
MCH: 21.9 pg — ABNORMAL LOW (ref 26.0–34.0)
MCHC: 29.7 g/dL — ABNORMAL LOW (ref 30.0–36.0)
MCV: 73.8 fL — ABNORMAL LOW (ref 80.0–100.0)
Platelets: 557 K/uL — ABNORMAL HIGH (ref 150–400)
RBC: 3.74 MIL/uL — ABNORMAL LOW (ref 3.87–5.11)
RDW: 20.8 % — ABNORMAL HIGH (ref 11.5–15.5)
WBC: 7.4 K/uL (ref 4.0–10.5)
nRBC: 0 % (ref 0.0–0.2)

## 2023-10-15 LAB — CBG MONITORING, ED
Glucose-Capillary: 15 mg/dL — CL (ref 70–99)
Glucose-Capillary: 82 mg/dL (ref 70–99)

## 2023-10-15 LAB — GLUCOSE, CAPILLARY
Glucose-Capillary: 50 mg/dL — ABNORMAL LOW (ref 70–99)
Glucose-Capillary: 55 mg/dL — ABNORMAL LOW (ref 70–99)
Glucose-Capillary: 91 mg/dL (ref 70–99)

## 2023-10-15 LAB — TROPONIN I (HIGH SENSITIVITY)
Troponin I (High Sensitivity): 6 ng/L (ref <18)
Troponin I (High Sensitivity): 9 ng/L (ref <18)

## 2023-10-15 LAB — CK: Total CK: 103 U/L (ref 38–234)

## 2023-10-15 MED ORDER — DEXTROSE 10 % IV SOLN: INTRAVENOUS | Status: DC

## 2023-10-15 MED ORDER — FLUTICASONE PROPIONATE 50 MCG/ACT NA SUSP: 2.0000 | Freq: Every day | NASAL | Status: DC | PRN

## 2023-10-15 MED ORDER — VITAMIN D 25 MCG (1000 UNIT) PO TABS
5000.0000 [IU] | ORAL_TABLET | Freq: Every day | ORAL | Status: DC
  Administered 2023-10-15 – 2023-10-20 (×6): 5000 [IU] via ORAL
  Filled 2023-10-15 (×6): qty 5

## 2023-10-15 MED ORDER — PREDNISOLONE ACETATE 1 % OP SUSP: 1.0000 [drp] | Freq: Two times a day (BID) | OPHTHALMIC | Status: DC

## 2023-10-15 MED ORDER — INSULIN ASPART 100 UNIT/ML IJ SOLN
0.0000 [IU] | Freq: Three times a day (TID) | INTRAMUSCULAR | Status: DC
  Administered 2023-10-16: 2 [IU] via SUBCUTANEOUS
  Administered 2023-10-16: 3 [IU] via SUBCUTANEOUS
  Administered 2023-10-16: 2 [IU] via SUBCUTANEOUS
  Administered 2023-10-17: 1 [IU] via SUBCUTANEOUS
  Administered 2023-10-17: 2 [IU] via SUBCUTANEOUS
  Administered 2023-10-17: 1 [IU] via SUBCUTANEOUS
  Administered 2023-10-18: 2 [IU] via SUBCUTANEOUS
  Administered 2023-10-18: 1 [IU] via SUBCUTANEOUS
  Administered 2023-10-18: 3 [IU] via SUBCUTANEOUS
  Administered 2023-10-20: 4 [IU] via SUBCUTANEOUS
  Administered 2023-10-20: 1 [IU] via SUBCUTANEOUS
  Filled 2023-10-15 (×11): qty 1

## 2023-10-15 MED ORDER — BETAMETHASONE DIPROPIONATE AUG 0.05 % EX LOTN: 1.0000 | TOPICAL_LOTION | Freq: Two times a day (BID) | CUTANEOUS | Status: DC

## 2023-10-15 MED ORDER — DICLOFENAC SODIUM 1 % EX GEL: 2.0000 g | Freq: Two times a day (BID) | CUTANEOUS | Status: DC | PRN

## 2023-10-15 MED ORDER — LOSARTAN POTASSIUM 50 MG PO TABS
50.0000 mg | ORAL_TABLET | Freq: Every day | ORAL | Status: DC
  Administered 2023-10-15 – 2023-10-18 (×4): 50 mg via ORAL
  Filled 2023-10-15 (×4): qty 1

## 2023-10-15 MED ORDER — LACTULOSE 10 GM/15ML PO SOLN
10.0000 g | Freq: Two times a day (BID) | ORAL | Status: DC | PRN
  Administered 2023-10-19: 10 g via ORAL
  Filled 2023-10-15: qty 30

## 2023-10-15 MED ORDER — VITAMIN B-12 1000 MCG PO TABS
1000.0000 ug | ORAL_TABLET | Freq: Every day | ORAL | Status: DC
  Administered 2023-10-16 – 2023-10-20 (×5): 1000 ug via ORAL
  Filled 2023-10-15 (×4): qty 2
  Filled 2023-10-15: qty 1

## 2023-10-15 MED ORDER — ONDANSETRON HCL 4 MG PO TABS
4.0000 mg | ORAL_TABLET | Freq: Four times a day (QID) | ORAL | Status: DC | PRN
Start: 2023-10-15 — End: 2023-10-20

## 2023-10-15 MED ORDER — DULOXETINE HCL 30 MG PO CPEP
60.0000 mg | ORAL_CAPSULE | Freq: Every day | ORAL | Status: DC
  Administered 2023-10-15 – 2023-10-20 (×6): 60 mg via ORAL
  Filled 2023-10-15 (×3): qty 2
  Filled 2023-10-15 (×2): qty 1
  Filled 2023-10-15: qty 2
  Filled 2023-10-15 (×2): qty 1
  Filled 2023-10-15: qty 2
  Filled 2023-10-15: qty 1

## 2023-10-15 MED ORDER — DEXTROSE 50 % IV SOLN
INTRAVENOUS | Status: AC
  Filled 2023-10-15: qty 50

## 2023-10-15 MED ORDER — ISOSORBIDE MONONITRATE ER 60 MG PO TB24
30.0000 mg | ORAL_TABLET | Freq: Every day | ORAL | Status: DC
  Administered 2023-10-15 – 2023-10-17 (×3): 30 mg via ORAL
  Filled 2023-10-15 (×3): qty 1

## 2023-10-15 MED ORDER — POTASSIUM CHLORIDE CRYS ER 20 MEQ PO TBCR
20.0000 meq | EXTENDED_RELEASE_TABLET | Freq: Every day | ORAL | Status: DC
  Administered 2023-10-16 – 2023-10-20 (×5): 20 meq via ORAL
  Filled 2023-10-15 (×5): qty 1

## 2023-10-15 MED ORDER — ALBUTEROL SULFATE HFA 108 (90 BASE) MCG/ACT IN AERS: 2.0000 | INHALATION_SPRAY | Freq: Four times a day (QID) | RESPIRATORY_TRACT | Status: DC | PRN

## 2023-10-15 MED ORDER — ONDANSETRON HCL 4 MG/2ML IJ SOLN: 4.0000 mg | Freq: Four times a day (QID) | INTRAMUSCULAR | Status: DC | PRN

## 2023-10-15 MED ORDER — TRAMADOL HCL 50 MG PO TABS
50.0000 mg | ORAL_TABLET | Freq: Three times a day (TID) | ORAL | Status: DC | PRN
  Administered 2023-10-17 – 2023-10-20 (×4): 50 mg via ORAL
  Filled 2023-10-15 (×5): qty 1

## 2023-10-15 MED ORDER — TRIAMCINOLONE ACETONIDE 0.5 % EX OINT
TOPICAL_OINTMENT | Freq: Two times a day (BID) | CUTANEOUS | Status: DC
  Administered 2023-10-16 – 2023-10-20 (×2): 1 via TOPICAL
  Filled 2023-10-15 (×2): qty 15

## 2023-10-15 MED ORDER — FERROUS SULFATE 325 (65 FE) MG PO TABS
325.0000 mg | ORAL_TABLET | Freq: Every day | ORAL | Status: DC
  Administered 2023-10-16 – 2023-10-18 (×3): 325 mg via ORAL
  Filled 2023-10-15 (×3): qty 1

## 2023-10-15 MED ORDER — DICLOFENAC SODIUM 1 % TD GEL: 2.0000 g | Freq: Two times a day (BID) | TRANSDERMAL | Status: DC | PRN

## 2023-10-15 MED ORDER — ACETAMINOPHEN 325 MG PO TABS: 650.0000 mg | ORAL_TABLET | Freq: Four times a day (QID) | ORAL | Status: DC | PRN

## 2023-10-15 MED ORDER — DEXTROSE 50 % IV SOLN
1.0000 | Freq: Once | INTRAVENOUS | Status: AC
  Administered 2023-10-15: 50 mL via INTRAVENOUS

## 2023-10-15 MED ORDER — APIXABAN 5 MG PO TABS
5.0000 mg | ORAL_TABLET | Freq: Two times a day (BID) | ORAL | Status: DC
  Administered 2023-10-15 – 2023-10-16 (×2): 5 mg via ORAL
  Filled 2023-10-15 (×2): qty 1

## 2023-10-15 MED ORDER — FUROSEMIDE 40 MG PO TABS
20.0000 mg | ORAL_TABLET | Freq: Every day | ORAL | Status: DC
  Administered 2023-10-15 – 2023-10-18 (×4): 20 mg via ORAL
  Filled 2023-10-15 (×4): qty 1

## 2023-10-15 MED ORDER — ALBUTEROL SULFATE (2.5 MG/3ML) 0.083% IN NEBU: 2.5000 mg | INHALATION_SOLUTION | Freq: Four times a day (QID) | RESPIRATORY_TRACT | Status: DC | PRN

## 2023-10-15 MED ORDER — CHLORTHALIDONE 25 MG PO TABS
25.0000 mg | ORAL_TABLET | Freq: Every day | ORAL | Status: DC
  Administered 2023-10-15 – 2023-10-17 (×3): 25 mg via ORAL
  Filled 2023-10-15 (×4): qty 1

## 2023-10-15 MED ORDER — INSULIN ASPART 100 UNIT/ML IJ SOLN: 0.0000 [IU] | Freq: Every day | INTRAMUSCULAR | Status: DC

## 2023-10-15 MED ORDER — PANTOPRAZOLE SODIUM 40 MG PO TBEC
40.0000 mg | DELAYED_RELEASE_TABLET | Freq: Every day | ORAL | Status: DC
  Administered 2023-10-15 – 2023-10-20 (×6): 40 mg via ORAL
  Filled 2023-10-15 (×6): qty 1

## 2023-10-15 MED ORDER — PRAVASTATIN SODIUM 20 MG PO TABS
40.0000 mg | ORAL_TABLET | Freq: Every day | ORAL | Status: DC
  Administered 2023-10-15 – 2023-10-20 (×6): 40 mg via ORAL
  Filled 2023-10-15 (×6): qty 2

## 2023-10-15 MED ORDER — DORZOLAMIDE HCL-TIMOLOL MAL 2-0.5 % OP SOLN
1.0000 [drp] | Freq: Two times a day (BID) | OPHTHALMIC | Status: DC
  Administered 2023-10-15 – 2023-10-20 (×10): 1 [drp] via OPHTHALMIC
  Filled 2023-10-15: qty 10

## 2023-10-15 MED ORDER — TRAZODONE HCL 50 MG PO TABS: 25.0000 mg | ORAL_TABLET | Freq: Every evening | ORAL | Status: DC | PRN

## 2023-10-15 MED ORDER — DOXEPIN HCL 10 MG PO CAPS
10.0000 mg | ORAL_CAPSULE | Freq: Every day | ORAL | Status: DC
  Administered 2023-10-15 – 2023-10-19 (×5): 10 mg via ORAL
  Filled 2023-10-15 (×5): qty 1

## 2023-10-15 MED ORDER — ACETAMINOPHEN 650 MG RE SUPP: 650.0000 mg | Freq: Four times a day (QID) | RECTAL | Status: DC | PRN

## 2023-10-15 NOTE — H&P (Addendum)
 [place]   PATIENT NAME: Wanda Gardner    MR#:  [id number]  DATE OF BIRTH:  Dec 26, 1940  DATE OF ADMISSION:  10/15/2023  PRIMARY CARE PHYSICIAN: Orlean Alan HERO, FNP   Patient is coming from: Home  REQUESTING/REFERRING PHYSICIAN: Ernest Shuck, MD  CHIEF COMPLAINT:   Chief Complaint  Patient presents with   Fall    HISTORY OF PRESENT ILLNESS:  Wanda Gardner is a 83 y.o. female with medical history significant for GERD, osteoarthritis, asthma, type 2 diabetes mellitus, hypertension, dyslipidemia, OSA and glaucoma, presented to the emergency room with acute onset of syncope with associated hypoglycemia.  She denied any nausea or vomiting or diarrhea or abdominal pain.  No fever or chills.  She stated she has not eaten anything today.  She is not sure if she took her Lantus  last night.  No chest pain or palpitations.  No cough or wheezing or dyspnea.  No dysuria, oliguria or hematuria or flank pain.  ED Course: When she came to the ER, BP was 138/93 with temperature 97.5 otherwise normal vital signs.  Labs revealed blood glucose of 164 and calcium 8.5 with albumin 3.2 and otherwise unremarkable CMP.  High-sensitivity troponin I was 609 and total CK was 103.  CBC showed anemia slightly worse than previous levels with microcytosis and thrombocytosis. EKG as reviewed by me : EKG showed atrial fibrillation with controlled ventricular sponsor of 63 and PVCs with low voltage QRS and anteroseptal Q waves. Imaging: Portable chest x-ray showed low lung volumes with no acute cardiopulmonary disease. Head and neck CT revealed no acute intracranial normality and no acute displaced fracture or traumatic listhesis of the cervical spine.  The patient was given an amp of D50 followed by D10 W at 100 mL/h.  She will be admitted to an observation medical telemetry bed for further evaluation and management. PAST MEDICAL HISTORY:   Past Medical History:  Diagnosis Date   Acid reflux    AKI  (acute kidney injury) (HCC) 06/10/2022   Arthritis    Asthma    Chest pain, unspecified 10/30/2013   Formatting of this note might be different from the original. Note: Unchanged Note: Unchanged Formatting of this note might be different from the original. Note: Unchanged Note: Unchanged   Constipation 04/20/2011   Formatting of this note might be different from the original. Note: Unchanged - probably [facility]. Note: Unchanged - probably [facility].   COPD (chronic obstructive pulmonary disease) (HCC)    Cough 10/30/2013   Formatting of this note might be different from the original. Note: Unchanged Note: Unchanged   Depression    Diabetes mellitus (HCC) 01/10/2022   Diabetes mellitus without complication (HCC)    Dyspnea    Glaucoma    Headache    right side of head   Headache 07/25/2011   Hyperlipidemia    Hypertension    Knee stiff 05/12/2022   Lens replaced 03/22/2010   Neuropathic pain of hands and feet    PONV (postoperative nausea and vomiting) 1982   back with my hysterectomy.   Pseudophakia 03/22/2010   Sleep apnea    Doesn't use CPAP   Stiffness of shoulder joint 05/12/2022   Trigger finger of right hand 01/10/2022   Wears dentures    full upper and lower    PAST SURGICAL HISTORY:   Past Surgical History:  Procedure Laterality Date   ABDOMINAL HYSTERECTOMY     CATARACT EXTRACTION W/ INTRAOCULAR LENS  IMPLANT, BILATERAL  CATARACT EXTRACTION W/PHACO Left 12/14/2021   Procedure: CATARACT EXTRACTION PHACO AND INTRAOCULAR LENS PLACEMENT (IOC) LEFT DIABETIC 24.79 02:23.9;  Surgeon: Mittie Gaskin, MD;  Location: [facility];  Service: Ophthalmology;  Laterality: Left;  Diabetic   CHOLECYSTECTOMY     COLONOSCOPY WITH PROPOFOL  N/A 10/29/2014   Procedure: COLONOSCOPY WITH PROPOFOL ;  Surgeon: Deward CINDERELLA Piedmont, MD;  Location: [facility];  Service: Gastroenterology;  Laterality: N/A;   ESOPHAGOGASTRODUODENOSCOPY (EGD) WITH PROPOFOL  N/A 10/29/2014   Procedure:  ESOPHAGOGASTRODUODENOSCOPY (EGD) WITH PROPOFOL ;  Surgeon: Deward CINDERELLA Piedmont, MD;  Location: [facility] ENDOSCOPY;  Service: Gastroenterology;  Laterality: N/A;   EYE SURGERY Bilateral 2011   lid lift done 3 times   FOOT SURGERY     HAND SURGERY     TONGUE SURGERY     removal of cancer   TONSILLECTOMY     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/13/2016   Procedure: TOTAL HIP ARTHROPLASTY ANTERIOR APPROACH;  Surgeon: Ozell Flake, MD;  Location: [facility] ORS;  Service: Orthopedics;  Laterality: Right;   TOTAL KNEE ARTHROPLASTY Right 11/23/2015   Procedure: TOTAL KNEE ARTHROPLASTY;  Surgeon: Ozell Flake, MD;  Location: [facility] OR[facility];  Service: Orthopedics;  Laterality: Right;   VOCAL CORD LATERALIZATION, ENDOSCOPIC APPROACH W/ MLB      SOCIAL HISTORY:   Social History   Tobacco Use   Smoking status: Every Day    Current packs/day: 0.50    Average packs/day: 0.5 packs/day for 50.0 years (25.0 ttl pk-yrs)    Types: Cigarettes   Smokeless tobacco: Never  Substance Use Topics   Alcohol use: Yes    Alcohol/week: 0.0 standard drinks of alcohol    Comment: 1 glass of wine twice a year    FAMILY HISTORY:   Family History  Problem Relation Age of Onset   Cancer Father    Heart disease Mother     DRUG ALLERGIES:  No Known Allergies  REVIEW OF SYSTEMS:   ROS As per history of present illness. All pertinent systems were reviewed above. Constitutional, HEENT, cardiovascular, respiratory, GI, GU, musculoskeletal, neuro, psychiatric, endocrine, integumentary and hematologic systems were reviewed and are otherwise negative/unremarkable except for positive findings mentioned above in the HPI.   MEDICATIONS AT HOME:   Prior to Admission medications   Medication Sig Start Date End Date Taking? Authorizing Provider  Accu-Chek Softclix Lancets lancets Use with device to check sugars twice daily 03/02/23   Orlean Alan HERO, FNP  albuterol  (VENTOLIN  HFA) 108 (90 Base) MCG/ACT inhaler Inhale 2 puffs into the lungs every 6  (six) hours as needed for wheezing or shortness of breath. 05/07/23   Orlean Alan HERO, FNP  apixaban  (ELIQUIS ) 5 MG TABS tablet Take 1 tablet (5 mg total) by mouth 2 (two) times daily. 06/04/23   Orlean Alan HERO, FNP  betamethasone , augmented, (DIPROLENE ) 0.05 % lotion Apply 1 Application topically 2 (two) times daily. 12/13/22   [provider]  chlorthalidone  (HYGROTON ) 25 MG tablet TAKE 1 TABLET BY MOUTH DAILY 09/07/23   Fernand Fredy RAMAN, MD  Cholecalciferol  (VITAMIN D3) 125 MCG (5000 UT) CAPS Take 1 capsule (5,000 Units total) by mouth daily. 12/14/22   Orlean Alan HERO, FNP  diclofenac  sodium (VOLTAREN ) 1 % GEL Apply 2 g topically 2 (two) times daily as needed (pain).     [provider]  docusate sodium  (COLACE) 100 MG capsule Take 1 capsule (100 mg total) by mouth 2 (two) times daily. 06/16/16   Viviann Pastor, MD  dorzolamide -timolol  (COSOPT ) 2-0.5 % ophthalmic solution Place  1 drop into both eyes 2 (two) times daily. 11/24/22   [provider]  doxepin  (SINEQUAN ) 10 MG capsule TAKE 1 CAPSULE BY MOUTH AT BEDTIME 09/14/22   Orlean Alan HERO, FNP  DULoxetine  (CYMBALTA ) 60 MG capsule TAKE 1 CAPSULE BY MOUTH DAILY 04/27/23   Orlean Alan HERO, FNP  esomeprazole  (NEXIUM ) 40 MG capsule Take 1 capsule (40 mg total) by mouth daily. 05/07/23   Orlean Alan HERO, FNP  ferrous sulfate  325 (65 FE) MG tablet Take 325 mg by mouth daily with breakfast.    [provider]  fluticasone  (FLONASE ) 50 MCG/ACT nasal spray Place 2 sprays into both nostrils daily as needed for allergies. 05/07/23   Orlean Alan HERO, FNP  furosemide  (LASIX ) 20 MG tablet TAKE 1 TABLET BY MOUTH DAILY 06/06/23   Orlean Alan HERO, FNP  glucose blood (ACCU-CHEK GUIDE TEST) test strip CHECK BLOOD SUGAR TWICE DAILY 03/01/23   Orlean Alan HERO, FNP  hydrOXYzine  (ATARAX ) 25 MG tablet Take 25 mg by mouth at bedtime as needed (insomnia).    [provider]  insulin  glargine (LANTUS  SOLOSTAR) 100  UNIT/ML Solostar Pen Inject 30 Units into the skin at bedtime. 05/07/23   Orlean Alan HERO, FNP  insulin  lispro (HUMALOG ) 100 UNIT/ML KwikPen Inject 10 Units into the skin 3 (three) times daily. Patient taking differently: Inject 3-15 Units into the skin 3 (three) times daily. 05/07/23   Orlean Alan HERO, FNP  Insulin  Pen Needle (BD PEN NEEDLE NANO U/F) 32G X 4 MM MISC 1 each by Does not apply route in the morning, at noon, in the evening, and at bedtime. 05/07/23   Orlean Alan HERO, FNP  isosorbide  mononitrate (IMDUR ) 30 MG 24 hr tablet Take 1 tablet (30 mg total) by mouth daily. 05/12/22   Orlean Alan HERO, FNP  lactulose  (CHRONULAC ) 10 GM/15ML solution Take 15 mLs (10 g total) by mouth 2 (two) times daily as needed for mild constipation, moderate constipation or severe constipation. 05/07/23   Orlean Alan HERO, FNP  losartan  (COZAAR ) 50 MG tablet TAKE 1 TABLET BY MOUTH DAILY 06/04/23   Orlean Alan HERO, FNP  metFORMIN  (GLUCOPHAGE ) 500 MG tablet Take 1 tablet (500 mg total) by mouth 2 (two) times daily. 06/04/23   Orlean Alan HERO, FNP  polyethylene glycol powder (GLYCOLAX /MIRALAX ) powder 2 cap fulls in a full glass of water, two times a day, for 5 days. 06/16/16   Viviann Pastor, MD  potassium chloride  SA (K-DUR,KLOR-CON ) 20 MEQ tablet Take 20 mEq by mouth daily.    [provider]  pravastatin  (PRAVACHOL ) 40 MG tablet TAKE 1 TABLET BY MOUTH DAILY 05/09/23   Orlean Alan HERO, FNP  prednisoLONE  acetate (PRED FORTE ) 1 % ophthalmic suspension Place 1 drop into the right eye 2 (two) times daily.    [provider]  traMADol  (ULTRAM ) 50 MG tablet TAKE 1 TABLET BY MOUTH EVERY 8 HOURS AS NEEDED 09/11/23   Orlean Alan HERO, FNP  vitamin B-12 (CYANOCOBALAMIN ) 1000 MCG tablet Take 1,000 mcg by mouth daily.    [provider]      VITAL SIGNS:  Blood pressure (!) 173/58, pulse 69, temperature 98.7 F (37.1 C), temperature source Oral, resp. rate 16, height 5' 7 (1.702 m),  weight 92.3 kg, SpO2 100%.  PHYSICAL EXAMINATION:  Physical Exam  GENERAL:  83 y.o.-year-old African-American female patient lying in the bed with no acute distress.  EYES: Pupils equal, round, reactive to light and accommodation. No scleral icterus. Extraocular muscles intact.  HEENT:  Head atraumatic, normocephalic. Oropharynx and nasopharynx clear.  NECK:  Supple, no jugular venous distention. No thyroid  enlargement, no tenderness.  LUNGS: Normal breath sounds bilaterally, no wheezing, rales,rhonchi or crepitation. No use of accessory muscles of respiration.  CARDIOVASCULAR: Regular rate and rhythm, S1, S2 normal. No murmurs, rubs, or gallops.  ABDOMEN: Soft, nondistended, nontender. Bowel sounds present. No organomegaly or mass.  EXTREMITIES: No pedal edema, cyanosis, or clubbing.  NEUROLOGIC: Cranial nerves II through XII are intact. Muscle strength 5/5 in all extremities. Sensation intact. Gait not checked.  PSYCHIATRIC: The patient is alert and oriented x 3.  Normal affect and good eye contact. SKIN: No obvious rash, lesion, or ulcer.   LABORATORY PANEL:   CBC Recent Labs  Lab 10/15/23 1535  WBC 7.4  HGB 8.2*  HCT 27.6*  PLT 557*   ------------------------------------------------------------------------------------------------------------------  Chemistries  Recent Labs  Lab 10/15/23 1535  NA 139  K 4.0  CL 105  CO2 25  GLUCOSE 164*  BUN 14  CREATININE 0.97  CALCIUM 8.5*  AST 18  ALT 12  ALKPHOS 63  BILITOT 0.4   ------------------------------------------------------------------------------------------------------------------  Cardiac Enzymes No results for input(s): TROPONINI in the last 168 hours. ------------------------------------------------------------------------------------------------------------------  RADIOLOGY:  CT Head Wo Contrast Result Date: 10/15/2023 CLINICAL DATA:  fall EXAM: CT HEAD WITHOUT CONTRAST CT CERVICAL SPINE WITHOUT CONTRAST  TECHNIQUE: Multidetector CT imaging of the head and cervical spine was performed following the standard protocol without intravenous contrast. Multiplanar CT image reconstructions of the cervical spine were also generated. RADIATION DOSE REDUCTION: This exam was performed according to the departmental dose-optimization program which includes automated exposure control, adjustment of the mA and/or kV according to patient size and/or use of iterative reconstruction technique. COMPARISON:  CT angio chest 06/10/2022, FINDINGS: CT HEAD FINDINGS Brain: Patchy and confluent areas of decreased attenuation are noted throughout the deep and periventricular white matter of the cerebral hemispheres bilaterally, compatible with chronic microvascular ischemic disease. No evidence of large-territorial acute infarction. No parenchymal hemorrhage. No mass lesion. No extra-axial collection. No mass effect or midline shift. No hydrocephalus. Basilar cisterns are patent. Vascular: No hyperdense vessel. Atherosclerotic calcifications are present within the cavernous internal carotid arteries. Skull: No acute fracture or focal lesion. Right lamina papyracea fracture. Sinuses/Orbits: Paranasal sinuses and mastoid air cells are clear. Bilateral lens replacement. Otherwise the orbits are unremarkable. Other: None. CT CERVICAL SPINE FINDINGS Alignment: Normal. Skull base and vertebrae: Multilevel moderate severe degenerative changes of the spine with associated multilevel moderate osseous neural foraminal stenosis. No severe osseous central canal stenosis. Multilevel cervical spine vertebral body height loss. No acute fracture. No aggressive appearing focal osseous lesion or focal pathologic process. Soft tissues and spinal canal: No prevertebral fluid or swelling. No visible canal hematoma. Upper chest: Stable 6 x 4 mm right apical pulmonary nodule-no further follow-up indicated. Other: None. IMPRESSION: 1. No acute intracranial  abnormality. 2. No acute displaced fracture or traumatic listhesis of the cervical spine. Electronically Signed   By: Morgane  Naveau M.D.   On: 10/15/2023 18:42   CT Cervical Spine Wo Contrast Result Date: 10/15/2023 CLINICAL DATA:  fall EXAM: CT HEAD WITHOUT CONTRAST CT CERVICAL SPINE WITHOUT CONTRAST TECHNIQUE: Multidetector CT imaging of the head and cervical spine was performed following the standard protocol without intravenous contrast. Multiplanar CT image reconstructions of the cervical spine were also generated. RADIATION DOSE REDUCTION: This exam was performed according to the departmental dose-optimization program which includes automated exposure control, adjustment of the mA and/or kV according to patient  size and/or use of iterative reconstruction technique. COMPARISON:  CT angio chest 06/10/2022, FINDINGS: CT HEAD FINDINGS Brain: Patchy and confluent areas of decreased attenuation are noted throughout the deep and periventricular white matter of the cerebral hemispheres bilaterally, compatible with chronic microvascular ischemic disease. No evidence of large-territorial acute infarction. No parenchymal hemorrhage. No mass lesion. No extra-axial collection. No mass effect or midline shift. No hydrocephalus. Basilar cisterns are patent. Vascular: No hyperdense vessel. Atherosclerotic calcifications are present within the cavernous internal carotid arteries. Skull: No acute fracture or focal lesion. Right lamina papyracea fracture. Sinuses/Orbits: Paranasal sinuses and mastoid air cells are clear. Bilateral lens replacement. Otherwise the orbits are unremarkable. Other: None. CT CERVICAL SPINE FINDINGS Alignment: Normal. Skull base and vertebrae: Multilevel moderate severe degenerative changes of the spine with associated multilevel moderate osseous neural foraminal stenosis. No severe osseous central canal stenosis. Multilevel cervical spine vertebral body height loss. No acute fracture. No  aggressive appearing focal osseous lesion or focal pathologic process. Soft tissues and spinal canal: No prevertebral fluid or swelling. No visible canal hematoma. Upper chest: Stable 6 x 4 mm right apical pulmonary nodule-no further follow-up indicated. Other: None. IMPRESSION: 1. No acute intracranial abnormality. 2. No acute displaced fracture or traumatic listhesis of the cervical spine. Electronically Signed   By: Morgane  Naveau M.D.   On: 10/15/2023 18:42   DG Chest Portable 1 View Result Date: 10/15/2023 CLINICAL DATA:  Status post fall. EXAM: PORTABLE CHEST 1 VIEW COMPARISON:  June 10, 2022 FINDINGS: The heart size and mediastinal contours are within normal limits. Low lung volumes are noted. Both lungs are clear. Chronic and degenerative changes are seen involving both shoulders. Multilevel degenerative changes are also present throughout the thoracic spine. IMPRESSION: Low lung volumes without active cardiopulmonary disease. Electronically Signed   By: Suzen Dials M.D.   On: 10/15/2023 16:17   DG Pelvis 1-2 Views Result Date: 10/15/2023 CLINICAL DATA:  Status post fall. EXAM: PELVIS - 1-2 VIEW COMPARISON:  None Available. 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 There is no evidence of an acute fracture or dislocation. No pelvic bone lesions are seen. Degenerative changes are seen within the left hip and visualized portion of the lower lumbar spine. IMPRESSION: 1. Intact total right hip replacement. 2. No acute fracture or dislocation. Electronically Signed   By: Suzen Dials M.D.   On: 10/15/2023 16:15      IMPRESSION AND PLAN:  Assessment and Plan: * Hypoglycemia - The patient be admitted to an observation medical telemetry bed. -This is associated with uncontrolled type 2 diabetes mellitus - She had subsequent unresponsiveness/syncope secondary to her hypoglycemia. - We will hold off her Lantus . - She will be placed on sensitive NovoLog  subcutaneous  replacement therapy. -Will hold off metformin . - She will be monitored for arrhythmias. - Will check orthostatics.  Essential hypertension - Will continue antihypertensive therapy.  Atrial fibrillation with controlled ventricular rate (HCC) - Will continue Eliquis .  Dyslipidemia - Will continue statin therapy.  Essential DVT prophylaxis: Lovenox . Advanced Care Planning:  Code Status: The patient is DNR only. Family Communication:  The plan of care was discussed in details with the patient (and family). I answered all questions. The patient agreed to proceed with the above mentioned plan. Further management will depend upon hospital course. Disposition Plan: Back to previous home environment Consults called: none. All the records are reviewed and case discussed with ED provider.  Status is: Observation  I certify that at the time of  admission, it is my clinical judgment that the patient will require i hospital care extending less than 2 midnights.                            Dispo: The patient is from: Home              Anticipated d/c is to: Home              Patient currently is not medically stable to d/c.              Difficult to place patient: No  Madison DELENA Peaches M.D on 10/15/2023 at 11:26 PM  [facility]   From 7 PM-7 AM, contact night-coverage [email or web address]  CC: Primary care physician; Orlean Alan HERO, FNP

## 2023-10-15 NOTE — Assessment & Plan Note (Signed)
-   Will continue antihypertensive therapy.

## 2023-10-15 NOTE — Assessment & Plan Note (Signed)
 Will continue statin therapy

## 2023-10-15 NOTE — ED Provider Notes (Signed)
 [facility] Provider Note    Event Date/Time   First MD Initiated Contact with Patient 10/15/23 1533     (approximate)   History   Fall   HPI  Wanda Gardner is a 83 y.o. female with history of DVT, atrial flutter on Eliquis  who comes in with syncopal episode.  Patient reports remembering feeling really hot and she had turned her fan on.  She went to go stand up and she does not remember anything.  She is unclear how long she was down on the ground for.  Per EMS her sugar was 27 on upon arrival and she was given D10 with a repeat sugar of 130.  Patient reports now no new chest pain, abdominal pain unsure if she hit her head.  She states that they did not let her stand up and they really denies any new pain.  She reports a lot of chronic pain in her back.  Physical Exam   Triage Vital Signs: ED Triage Vitals [10/15/23 1532]  Encounter Vitals Group     BP      Girls Systolic BP Percentile      Girls Diastolic BP Percentile      Boys Systolic BP Percentile      Boys Diastolic BP Percentile      Pulse      Resp      Temp      Temp src      SpO2      Weight 182 lb (82.6 kg)     Height      Head Circumference      Peak Flow      Pain Score 7     Pain Loc      Pain Education      Exclude from Growth Chart     Most recent vital signs: Vitals:   10/15/23 1558  BP: (!) 138/93  Pulse: 64  Resp: 17  Temp: (!) 97.5 F (36.4 C)  SpO2: 99%     General: Awake, no distress.  CV:  Good peripheral perfusion.  Resp:  Normal effort.  Abd:  No distention.  Soft and nontender Other:  Abrasion to her lip with a little bit of blood trickling to the side.  No facial tenderness.  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  No tenderness under her orbits. No chest wall tenderness no obvious, she reports some mild hip tenderness but is able to lift both legs up off the bed. ED Results / Procedures / Treatments   Labs (all labs ordered are listed,  but only abnormal results are displayed) Labs Reviewed  CBC  COMPREHENSIVE METABOLIC PANEL WITH GFR  CK  URINALYSIS, ROUTINE W REFLEX MICROSCOPIC  TROPONIN I (HIGH SENSITIVITY)     EKG  My interpretation of EKG:  Atrial fibrillation with a rate of 63 without any ST elevation or T wave inversion  RADIOLOGY I have reviewed the xray personally and interpreted no fracture    PROCEDURES:  Critical Care performed: No  .1-3 Lead EKG Interpretation  Performed by: Ernest Ronal BRAVO, MD Authorized by: Ernest Ronal BRAVO, MD     Interpretation: normal     ECG rate:  70   ECG rate assessment: normal     Rhythm: sinus rhythm     Ectopy: none     Conduction: normal   .Critical Care  Performed by: Ernest Ronal BRAVO, MD Authorized by: Ernest Ronal BRAVO, MD  Critical care provider statement:    Critical care time (minutes):  30   Critical care was necessary to treat or prevent imminent or life-threatening deterioration of the following conditions: hypglycemia.   Critical care was time spent personally by me on the following activities:  Development of treatment plan with patient or surrogate, discussions with consultants, evaluation of patient's response to treatment, examination of patient, ordering and review of laboratory studies, ordering and review of radiographic studies, ordering and performing treatments and interventions, pulse oximetry, re-evaluation of patient's condition and review of old charts    MEDICATIONS ORDERED IN ED: Medications  dextrose  10 % infusion (has no administration in time range)  dextrose  50 % solution 50 mL ( Intravenous Canceled Entry 10/15/23 1947)     IMPRESSION / MDM / ASSESSMENT AND PLAN / ED COURSE  I reviewed the triage vital signs and the nursing notes.   Patient's presentation is most consistent with acute presentation with potential threat to life or bodily function.   Patient comes in with syncopal episode with noted hypoglycemia.  Workup was done  here evaluate for intercranial hemorrhage, cervical fracture given patient is on blood thinner unsure if she hit her head and does have a little bit of blood from an abrasion on her lip nothing that looks like it needs to be sutured.  Her urine does have some positive nitrites but rare bacteria will send for urine culture.  Her CBC shows slightly low hemoglobin rectal exam with no stool in the fall but she denies any known black tarry stool.  Her CMP is reassuring CK normal troponin negative.  Patient's repeat glucose was low again at 15.  Will start patient on D10 and given amp of dextrose .  Will discuss hospital team for admission for recurrent hypoglycemia, syncopal episode.   The patient is on the cardiac monitor to evaluate for evidence of arrhythmia and/or significant heart rate changes.      FINAL CLINICAL IMPRESSION(S) / ED DIAGNOSES   Final diagnoses:  Syncope, unspecified syncope type  Hypoglycemia     Rx / DC Orders   ED Discharge Orders     None        Note:  This document was prepared using Dragon voice recognition software and may include unintentional dictation errors.   Ernest Ronal BRAVO, MD 10/15/23 2010

## 2023-10-15 NOTE — ED Triage Notes (Signed)
 Pt arrived via EMS from home. Pt fell last night and has possibly been on the floor the entire time while family has been at the house. EMS sts that pt BGL on arrival was 27. Pt received D10 with a BGL of 130 per EMS. Pt is cold to the touch. Pt is A/Ox4 at this time.

## 2023-10-15 NOTE — Assessment & Plan Note (Addendum)
 Discontinued D10 drip on 8/28.  Patient on sliding scale insulin .  Patient only eats 1 meal a day and likely will not need as much insulin  upon going home.  Low-dose long-acting insulin  9 units at night plus sliding scale.

## 2023-10-15 NOTE — Assessment & Plan Note (Addendum)
Continue Eliquis for anticoagulation to prevent stroke.

## 2023-10-15 NOTE — ED Notes (Signed)
 This tech went to obtain pt CBG. Pt CBG resulted 15. Writer went out of room and notified pt primary RN, Hildreth Arbour, RN and Ernest, MD. Repeat CBG was obtained and CBG showed 82.

## 2023-10-16 ENCOUNTER — Telehealth (HOSPITAL_COMMUNITY): Payer: Self-pay | Admitting: Pharmacy Technician

## 2023-10-16 ENCOUNTER — Other Ambulatory Visit (HOSPITAL_COMMUNITY): Payer: Self-pay

## 2023-10-16 DIAGNOSIS — E162 Hypoglycemia, unspecified: Secondary | ICD-10-CM | POA: Diagnosis not present

## 2023-10-16 LAB — BASIC METABOLIC PANEL WITH GFR
Anion gap: 7 (ref 5–15)
BUN: 10 mg/dL (ref 8–23)
CO2: 26 mmol/L (ref 22–32)
Calcium: 8.5 mg/dL — ABNORMAL LOW (ref 8.9–10.3)
Chloride: 105 mmol/L (ref 98–111)
Creatinine, Ser: 0.88 mg/dL (ref 0.44–1.00)
GFR, Estimated: >60 mL/min (ref >60)
Glucose, Bld: 192 mg/dL — ABNORMAL HIGH (ref 70–99)
Potassium: 3.4 mmol/L — ABNORMAL LOW (ref 3.5–5.1)
Sodium: 138 mmol/L (ref 135–145)

## 2023-10-16 LAB — GLUCOSE, CAPILLARY
Glucose-Capillary: 202 mg/dL — ABNORMAL HIGH (ref 70–99)
Glucose-Capillary: 263 mg/dL — ABNORMAL HIGH (ref 70–99)
Glucose-Capillary: 226 mg/dL — ABNORMAL HIGH (ref 70–99)
Glucose-Capillary: 280 mg/dL — ABNORMAL HIGH (ref 70–99)

## 2023-10-16 LAB — PREPARE RBC (CROSSMATCH)

## 2023-10-16 LAB — CBC
HCT: 22.4 % — ABNORMAL LOW (ref 36.0–46.0)
Hemoglobin: 6.9 g/dL — ABNORMAL LOW (ref 12.0–15.0)
MCH: 22.1 pg — ABNORMAL LOW (ref 26.0–34.0)
MCHC: 30.8 g/dL (ref 30.0–36.0)
MCV: 71.8 fL — ABNORMAL LOW (ref 80.0–100.0)
Platelets: 447 K/uL — ABNORMAL HIGH (ref 150–400)
RBC: 3.12 MIL/uL — ABNORMAL LOW (ref 3.87–5.11)
RDW: 20.4 % — ABNORMAL HIGH (ref 11.5–15.5)
WBC: 6.4 K/uL (ref 4.0–10.5)
nRBC: 0 % (ref 0.0–0.2)

## 2023-10-16 LAB — IRON AND TIBC
Iron: 11 ug/dL — ABNORMAL LOW (ref 28–170)
Saturation Ratios: 3 % — ABNORMAL LOW (ref 10.4–31.8)
TIBC: 375 ug/dL (ref 250–450)
UIBC: 364 ug/dL

## 2023-10-16 LAB — RETIC PANEL
Immature Retic Fract: 24.6 % — ABNORMAL HIGH (ref 2.3–15.9)
RBC.: 3.1 MIL/uL — ABNORMAL LOW (ref 3.87–5.11)
Retic Count, Absolute: 60.5 K/uL (ref 19.0–186.0)
Retic Ct Pct: 2 % (ref 0.4–3.1)
Reticulocyte Hemoglobin: 22.3 pg — ABNORMAL LOW (ref >27.9)

## 2023-10-16 LAB — FERRITIN: Ferritin: 4 ng/mL — ABNORMAL LOW (ref 11–307)

## 2023-10-16 LAB — HEMOGLOBIN A1C
Hgb A1c MFr Bld: 6.5 % — ABNORMAL HIGH (ref 4.8–5.6)
Mean Plasma Glucose: 139.85 mg/dL

## 2023-10-16 LAB — FOLATE: Folate: 30 ng/mL (ref >5.9)

## 2023-10-16 LAB — HEMOGLOBIN AND HEMATOCRIT, BLOOD
HCT: 21.2 % — ABNORMAL LOW (ref 36.0–46.0)
Hemoglobin: 6.5 g/dL — ABNORMAL LOW (ref 12.0–15.0)

## 2023-10-16 LAB — VITAMIN B12: Vitamin B-12: 399 pg/mL (ref 180–914)

## 2023-10-16 MED ORDER — SODIUM CHLORIDE 0.9% IV SOLUTION: Freq: Once | INTRAVENOUS | Status: AC

## 2023-10-16 MED ORDER — PNEUMOCOCCAL 20-VAL CONJ VACC 0.5 ML IM SUSY
0.5000 mL | PREFILLED_SYRINGE | INTRAMUSCULAR | Status: AC
  Administered 2023-10-17: 0.5 mL via INTRAMUSCULAR
  Filled 2023-10-16: qty 0.5

## 2023-10-16 MED ORDER — HYDROXYZINE HCL 25 MG PO TABS: 25.0000 mg | ORAL_TABLET | Freq: Every evening | ORAL | Status: DC | PRN

## 2023-10-16 MED ORDER — SODIUM CHLORIDE 0.9 % IV SOLN
1.0000 g | INTRAVENOUS | Status: DC
  Administered 2023-10-16 – 2023-10-17 (×2): 1 g via INTRAVENOUS
  Filled 2023-10-16 (×2): qty 10

## 2023-10-16 MED ORDER — IRON SUCROSE 200 MG IVPB - SIMPLE MED
200.0000 mg | Freq: Once | Status: DC
  Filled 2023-10-16: qty 110

## 2023-10-16 NOTE — Progress Notes (Addendum)
 Progress Note   Patient: Wanda Gardner FMW:[id number] DOB: January 01, 1941 DOA: 10/15/2023     0 DOS: the patient was seen and examined on 10/16/2023   Brief hospital course: HPI on admission: Wanda Gardner is a 83 y.o. female with medical history significant for GERD, osteoarthritis, asthma, type 2 diabetes mellitus, hypertension, dyslipidemia, OSA and glaucoma, presented to the emergency room with acute onset of syncope with associated hypoglycemia.  She denied any nausea or vomiting or diarrhea or abdominal pain.  No fever or chills.  She stated she has not eaten anything today.  She is not sure if she took her Lantus  last night. ... See H&P for full HPI on admission & ED course.  Per ED documentation, EMS reported patient's initial glucose was 27, they gave D10 with repeat glucose of 130.  Hypoglycemia was recurrent in the ED, dropping to 15.  Pt was given amp of dextrose  and started on D10 infusion.    Pt admitted for further evaluation and management. Started on empiric IV Rocephin  for UTI pending urine culture results.    Assessment and Plan:  Hypoglycemia Insulin -dependent type 2 diabetes Home regimen PTA was Lantus  30 units at bedtime, Humalog  3-15 units TID WC --Reduce D10w from 100 >> 50 cc/hr --CBG's Q4H --Wean off D10 as sugars tolerate --Appreciate diabetes coordinator input -- recommends decrease PTA Lantus  to 15 units at bedtime, Humalog  0-6 units TID (very sensitive sliding scale) --PCP follow up closely to titrate regimen  UTI - start empiric Rocpehin.  Follow urine culture.  Iron  deficiency anemia -- Initial Hbg 8.2, down to 6.9 on AM labs.  Suspect dilution from IV fluids.  No evidence of bleeding.  Hbg 10 months ago was about 9. --Anemia panel shows severe iron  deficiency despite being on PO iron  supplement --Pt gave consent for blood transfusion if needed --Hold off IV iron  infusion today --Afternoon Hbg down to 6.5 -- transfuse 1 unit pRBC's --Consider iron   infusion tomorrow --Consider GI consult, but no evidence of GI bleeding. Suspect this is severe iron  deficiency and dilution from IV fluids --Hold Eliquis  --Monitor closely for signs of bleeding, including internal bleeding given her fall / syncopal episode.  Re-image as necessary. --AM CBC  Hypokalemia - K 3.4 on admission was replaced. --Monitor BMP & replace K PRN  Essential hypertension --Continue home regimen  Atrial fibrillation with controlled ventricular rate  --Hold Eliquis  due to worsening anemia --Telemetry  Dyslipidemia --Continue statin  Insomnia --Home hydroxyzine  at bedtime PRN  Glaucoma --Drops continued         Subjective: Pt seen with daughter this AM.  She reports lip pain from the abrasion/cut sustained when she fell, asking for vaseline to put on it.   Reports left heel pain, and scratches down left leg from the family's dog that was all over her when she was on the floor.  Right eye pain, has been getting eye drops.  States the lens placed during prior eye surgery came out recently.  Denies abdominal pain, N/V, F/C or other acute complaints.    Physical Exam: Vitals:   10/15/23 1938 10/15/23 2126 10/15/23 2229 10/16/23 0741  BP: (!) 153/61 (!) 173/58  (!) 102/41  Pulse: 77 69  73  Resp: 19 16  18   Temp: 98.2 F (36.8 C) 98.7 F (37.1 C)  98.8 F (37.1 C)  TempSrc: Oral Oral  Oral  SpO2: 98% 100%  99%  Weight:   92.3 kg   Height:   5' 7 (  1.702 m)    General exam: awake, alert, no acute distress HEENT: both eyes mild erytema slightly more on right, EOM's intact, moist mucus membranes, hearing grossly normal  Respiratory system: CTAB, no wheezes, rales or rhonchi, normal respiratory effort. Cardiovascular system: normal S1/S2, RRR, no pedal edema.   Gastrointestinal system: soft, NT, ND, no HSM felt, +bowel sounds. Central nervous system: A&O x 3. no gross focal neurologic deficits, normal speech Extremities: moves all, no edema, normal  tone Skin: dry, intact, scattered superficial scratches on left leg, no visualized abnormalities of left heel Psychiatry: normal mood, congruent affect, judgement and insight appear normal   Data Reviewed:  Notable labs -- K 3.4, glucose 192, Ca 8.5 CBC Hbg 6.9, Platelets 447 Iron  low 11, sat ratio low 3%, ferritin low 4, normal folate  Pending - B12 level  Family Communication: daughter at bedside on rounds today  Disposition: Status is: Observation Remains admitted on IV antibiotic pending urine culture, close monitoring of glucose levels while weaning of D10 infusion    Planned Discharge Destination: Home    Time spent: 45 minutes  Author: Burnard DELENA Cunning, DO 10/16/2023 1:07 PM  For on call review [email or web address].

## 2023-10-16 NOTE — Telephone Encounter (Signed)
 Pharmacy Patient Advocate Encounter  Insurance verification completed.    The patient is insured through [facility].     Ran test claim for Freestyle Libre 3 Plus Sensors and the current 30 day co-pay is $0.00.  Ran test claim for [employer] 3 Reader  and the current 30 day co-pay is $0.00.  Ran test claim for Dexcom G7 Sensors and the current 30 day co-pay is $0.00.  Ran test claim for Dexcom G7 Receiver and the current 30 day co-pay is $0.00.  This test claim was processed through [facility]- copay amounts may vary at other pharmacies due to pharmacy/plan contracts, or as the patient moves through the different stages of their insurance plan.

## 2023-10-16 NOTE — Care Management Obs Status (Signed)
 MEDICARE OBSERVATION STATUS NOTIFICATION   Patient Details  Name: Wanda Gardner MRN: [id number] Date of Birth: [birth year]   Medicare Observation Status Notification Given:  Yes (did not want a copy)    [name] 10/16/2023, 11:54 AM

## 2023-10-16 NOTE — Evaluation (Signed)
 Physical Therapy Evaluation Patient Details Name: Wanda Gardner MRN: [id number] DOB: [birth year] Today's Date: 10/16/2023  History of Present Illness  Pt is an 83 y.o. female with medical history significant for GERD, osteoarthritis, asthma, type 2 diabetes mellitus, hypertension, dyslipidemia, OSA and glaucoma, presented to the emergency room with acute onset of syncope with associated hypoglycemia.  Ik:Ybenhobrzfpj, Essential hypertension, Atrial fibrillation with controlled ventricular rate, Dyslipidemia.   Clinical Impression  Pt admitted with above diagnosis. Pt currently with functional limitations due to the deficits listed below (see PT Problem List). Pt received supine in bed. Agreeable to PT/OT co-eval as per OT pt reports need for linens and gown change. To date, pt and daughter reports many falls at home mainly at night, but lives alone and typically independent to mod-I with occasional DME use. Daughter checks in and stays with pt as often as possible but is not there 24/7.   To date, pt able to exit bed at minA+1. Assist provided by PT/OT seated EoB with gown change. MinA+1 for STS to RW for first effort with additional STS performed supervision needing education and min to mod VC's for safe standing to RW with fair carryover. Pt able to complete 21' CGA with transition to supervision. Pt is able to complete safely but fatigues quickly leading to heavy BUE support on RW due to LE weakness. Pt left seated EOB. Education provided on adequate lighting and DME to reduce falls risk for night time toileting where pt is having majority of her falls. Pt verbalized understanding. Pt will benefit from skilled PT services to address safety awareness and LE weakness to maximize return to PLOF and reduce falls risk.      If plan is discharge home, recommend the following: A little help with walking and/or transfers;A little help with bathing/dressing/bathroom;Assistance with cooking/housework;Assist  for transportation;Direct supervision/assist for medications management;Help with stairs or ramp for entrance   Can travel by private vehicle        Equipment Recommendations Rolling walker (2 wheels)  Recommendations for Other Services       Functional Status Assessment Patient has had a recent decline in their functional status and demonstrates the ability to make significant improvements in function in a reasonable and predictable amount of time.     Precautions / Restrictions Precautions Precautions: Fall Restrictions Weight Bearing Restrictions Per Provider Order: No      Mobility  Bed Mobility Overal bed mobility: Needs Assistance Bed Mobility: Supine to Sit     Supine to sit: Min assist, HOB elevated, Used rails       Patient Response: Cooperative  Transfers Overall transfer level: Needs assistance Equipment used: Rolling walker (2 wheels) Transfers: Sit to/from Stand Sit to Stand: Min assist, Contact guard assist           General transfer comment: initial MinA+1. With repeated STS able to complete CGA.    Ambulation/Gait Ambulation/Gait assistance: Supervision Gait Distance (Feet): 60 Feet Assistive device: Rolling walker (2 wheels) Gait Pattern/deviations: Step-through pattern, Decreased step length - right, Decreased step length - left, Trunk flexed       General Gait Details: fatigues quickly by end of bout with notable increase in BUE WB through RW.  Stairs            Wheelchair Mobility     Tilt Bed Tilt Bed Patient Response: Cooperative  Modified Rankin (Stroke Patients Only)       Balance Overall balance assessment: Needs assistance Sitting-balance support: Single extremity supported Sitting  balance-Leahy Scale: Good Sitting balance - Comments: sitting to doff/don hospital gown   Standing balance support: Bilateral upper extremity supported, During functional activity, Reliant on assistive device for balance Standing  balance-Leahy Scale: Fair                               Pertinent Vitals/Pain Pain Assessment Pain Assessment: Faces Faces Pain Scale: Hurts little more Pain Location: L heel Pain Descriptors / Indicators: Aching, Grimacing, Discomfort Pain Intervention(s): Monitored during session, Repositioned    Home Living Family/patient expects to be discharged to:: Private residence Living Arrangements: Alone Available Help at Discharge: Family;Available PRN/intermittently Type of Home: House Home Access: Ramped entrance       Home Layout: One level Home Equipment: Rollator (4 wheels);Cane - single point;BSC/3in1 Additional Comments: BSC placed at bedside at home    Prior Function Prior Level of Function : Independent/Modified Independent;History of Falls (last six months)             Mobility Comments: Mod Independent with ADLs, household mobilization, furniture walks in home environment.  Has has several falls in the last 42mo ADLs Comments: Mod I with ADLs, assistance from family with IADLs (cooking and laundry).  Has pet to care for (large dog - german shepherd dog).     Extremity/Trunk Assessment   Upper Extremity Assessment Upper Extremity Assessment: Defer to OT evaluation    Lower Extremity Assessment Lower Extremity Assessment: Generalized weakness       Communication   Communication Communication: No apparent difficulties    Cognition Arousal: Alert Behavior During Therapy: WFL for tasks assessed/performed                             Following commands: Intact       Cueing Cueing Techniques: Verbal cues, Gestural cues     General Comments      Exercises Other Exercises Other Exercises: Role of PT in acute setting, d/c recs, use of DME to reduce falls risk. Discussed adequate lighting for night time toileting especially with baseline visual deficits.   Assessment/Plan    PT Assessment Patient needs continued PT services   PT Problem List Decreased strength;Decreased activity tolerance;Decreased balance;Decreased mobility;Decreased knowledge of use of DME;Decreased safety awareness       PT Treatment Interventions DME instruction;Balance training;Gait training;Neuromuscular re-education;Stair training;Functional mobility training;Patient/family education;Therapeutic activities;Therapeutic exercise    PT Goals (Current goals can be found in the Care Plan section)  Acute Rehab PT Goals Patient Stated Goal: to go home PT Goal Formulation: With patient Time For Goal Achievement: 10/30/23 Potential to Achieve Goals: Good    Frequency Min 2X/week     Co-evaluation PT/OT/SLP Co-Evaluation/Treatment: Yes Reason for Co-Treatment: To address functional/ADL transfers;Complexity of the patient's impairments (multi-system involvement) PT goals addressed during session: Mobility/safety with mobility OT goals addressed during session: ADL's and self-care;Proper use of Adaptive equipment and DME       AM-PAC PT 6 Clicks Mobility  Outcome Measure Help needed turning from your back to your side while in a flat bed without using bedrails?: A Lot Help needed moving from lying on your back to sitting on the side of a flat bed without using bedrails?: A Lot Help needed moving to and from a bed to a chair (including a wheelchair)?: A Little Help needed standing up from a chair using your arms (e.g., wheelchair or bedside chair)?: A Little  Help needed to walk in hospital room?: A Little Help needed climbing 3-5 steps with a railing? : A Little 6 Click Score: 16    End of Session Equipment Utilized During Treatment: Gait belt Activity Tolerance: Patient tolerated treatment well Patient left: with call bell/phone within reach;with bed alarm set;with family/visitor present (seataed EOB) Nurse Communication: Mobility status PT Visit Diagnosis: Other abnormalities of gait and mobility (R26.89);Muscle weakness  (generalized) (M62.81);Difficulty in walking, not elsewhere classified (R26.2)    Time: 8499-8473 PT Time Calculation (min) (ACUTE ONLY): 26 min   Charges:   PT Evaluation $PT Eval Moderate Complexity: 1 Mod PT Treatments $Self Care/Home Management: 8-22 PT General Charges $$ ACUTE PT VISIT: 1 Visit         Dorina HERO. Fairly IV, PT, DPT Physical Therapist- [place]  [facility] 10/16/2023, 4:09 PM

## 2023-10-16 NOTE — Discharge Instructions (Addendum)
 Please start using Dexcom G7 sensors and receiver as soon as you get home for glucose monitoring.  Be sure that you clean skin with soap/water, then alcohol wipes and let it dry before applying sensor. If you still have an issue with the sensor staying on, you could get some Skin Tac and/or clear tegaderms to help it stay on better.   Agency Name: [facility] Agency Address: [address] [place], [place] [address] Phone: [phone] Website: [email or web address] Service(s) Offered: Housing services, self-sufficiency, congregate meal program, and individual development account program.  Agency Name: [employer] Address: [address], [place], [place] [address] Phone: [phone] Email: [email or web address] .org Website: [email or web address] Service(s) Offered: Housing the homeless, feeding the hungry, Company secretary, job and education related services.  Agency Name: [facility] Address: [address], [place], [place] [address] Phone: [phone] Email: [email or web address] Service(s) Offered: Counseling, problem pregnancy, advocacy for Hispanics, limited emergency financial assistance.  Agency Name: [employer] Address: [address] [place], [place] [address] Phone: [phone] Website: [email or web address].[email or web address]/dss Service(s) Offered: Child support services; child welfare services; SNAP; Medicaid; work first family assistance; and aid with fuel,  rent, food and medicine.  Agency Name: [employer] Address: [address], [place], [place] [address] Phone: [phone] or [phone] Email: [email or web address] Service(s) Offered: Family services and transient assistance; emergency food, fuel, clothing, limited furniture, utilities; budget counseling, general counseling; give a kid a coat; thrift store; Christmas food and toys. Utility assistance, food pantry,  rental  assistance, life sustaining medicine    Can restart glucophage  in two days

## 2023-10-16 NOTE — Inpatient Diabetes Management (Addendum)
 Inpatient Diabetes Program Recommendations  AACE/ADA: New Consensus Statement on Inpatient Glycemic Control   Target Ranges:  Prepandial:   less than 140 mg/dL      Peak postprandial:   less than 180 mg/dL (1-2 hours)      Critically ill patients:  140 - 180 mg/dL    Latest Reference Range & Units 10/15/23 19:41 10/15/23 19:46 10/15/23 21:33 10/15/23 21:49 10/15/23 22:27  Glucose-Capillary 70 - 99 mg/dL 15 (LL) 82 55 (L) 50 (L) 91    Latest Reference Range & Units 10/15/23 15:35 10/16/23 04:31  Glucose 70 - 99 mg/dL 835 (H) 807 (H)   Review of Glycemic Control  Diabetes history: DM2 Outpatient Diabetes medications: Lantus  30 units at bedtime, Humalog  3-15 units TID with meals Current orders for Inpatient glycemic control: Novolog  0-6 units TID with meals, Novolog  0-5 units QHS  Inpatient Diabetes Program Recommendations:    Outpatient DM: Given patient had fall at home, noted to have CBG of 27 mg/dl with EMS (treated with D10), and CBG of 15 mg/dl at 80:58 on 1/74/74, would recommend to decrease outpatient Lantus  dose and Humalog  to 0-6 units TID with meals (based on same Novolog  correction scale that is currently ordered). Recommend patient follow up with PCP regarding DM management.  At discharge, please provide Rx for Baqsimi (nasal glucagon, [id number]).   Addendum 10/16/23@12 :50-Spoke with patient at bedside. Patient reports that she is consistently taking Lantus  30 units at bedtime and Humalog  with meals. Patient states that she does finger sticks BID at home and they are usually in the low 100's mg/dl. Inquired about hypoglycemia prior to this episode and patient reports that when her glucose gets low she usually sees spots and she will get her something with sugar. Patient reports she does not have hypoglycemia very often.  Discussed using CGM sensor that has alerts for low and high glucose. Patient reports that she has Dexcom G7 sensors and receiver device at home but she has not started  using it yet.  She reports that the has tried the sensors in the past and they did not stick well or stay on well.  Discussed cleaning the site with soap and water, then cleaning with alcohol pad, and then making sure it is dry. Also discussed using SkinTac and covering with Tegaderm to help it stay on if adhesion is still an issue. Patient reports she does not remember anything about falling or having hypoglycemia; she reports when she came to with EMS they told her she had fallen and that her glucose was low so it was treated. Discussed dangers of hypoglycemia especially in elderly with risk of falls. Strongly encouraged patient to apply the Dexcom G7 sensor when she gets home and start using the Dexcom receiver to monitor glucose. Discussed how the low and high glucose alarms work on CGM sensor and can alert her to a low prior to her glucose getting to low where she needs help with treating. Encouraged patient to have glucose tablets at bedside to treat low if needed.  Patient verbalized understanding of information discussed. Patient notes that her right hip hurts and she is not sure if it was xrayed or now. Informed patient I would let attending know she is complaining about right hip pain.   Thanks, Earnie Gainer, RN, MSN, CDCES Diabetes Coordinator Inpatient Diabetes Program [phone] (Team Pager from 8am to 5pm)

## 2023-10-16 NOTE — Evaluation (Signed)
 Occupational Therapy Evaluation Patient Details Name: Wanda Gardner MRN: [id number] DOB: 1940/12/07 Today's Date: 10/16/2023   History of Present Illness   Pt is an 83 y.o. female with medical history significant for GERD, osteoarthritis, asthma, type 2 diabetes mellitus, hypertension, dyslipidemia, OSA and glaucoma, presented to the emergency room with acute onset of syncope with associated hypoglycemia.  Ik:Ybenhobrzfpj, Essential hypertension, Atrial fibrillation with controlled ventricular rate, Dyslipidemia.     Clinical Impressions Pt seen for OT/PT co-treatment on this date with OT focus on ADL task engagement and functional balance required for standing components of ADLs. Upon arrival to room pt supine in bed with HOB elevated, agreeable to tx. Pt completed UB/LB dressing tasks seated EOB and required cuing for safety awareness with sit to stand transfers.  Pt demonstrates good sitting balance during ADL task engagement at EOB and Fair standing balance at RW with cuing for safe management of self and RW in small spaces.  Discussed fall risk management at home with [facility] placement as pt and family member present at bedside reported pt has had several falls from bed while attempting to access [facility]. Pt making good progress toward goals, will continue to follow POC. Discharge recommendation remains appropriate.       If plan is discharge home, recommend the following:   A little help with walking and/or transfers;A little help with bathing/dressing/bathroom;Assistance with cooking/housework     Functional Status Assessment   Patient has had a recent decline in their functional status and demonstrates the ability to make significant improvements in function in a reasonable and predictable amount of time.     Equipment Recommendations   Other (comment) (RW)     Recommendations for Other Services         Precautions/Restrictions   Precautions Precautions: Fall      Mobility Bed Mobility Overal bed mobility: Needs Assistance Bed Mobility: Supine to Sit     Supine to sit: Min assist, HOB elevated, Used rails          Transfers Overall transfer level: Needs assistance Equipment used: Rolling walker (2 wheels) Transfers: Sit to/from Stand Sit to Stand: Min assist           General transfer comment: Increased time to transition to standing      Balance Overall balance assessment: Needs assistance Sitting-balance support: Single extremity supported Sitting balance-Leahy Scale: Good Sitting balance - Comments: sitting to doff/don hospital gown   Standing balance support: Bilateral upper extremity supported, During functional activity, Reliant on assistive device for balance Standing balance-Leahy Scale: Fair                             ADL either performed or assessed with clinical judgement   ADL Overall ADL's : Needs assistance/impaired Eating/Feeding: Set up;Sitting   Grooming: Wash/dry face;Sitting;Set up           Upper Body Dressing : Minimal assistance;Sitting Upper Body Dressing Details (indicate cue type and reason): hospital gown Lower Body Dressing: Minimal assistance;Sit to/from stand   Toilet Transfer: BSC/3in1;Rolling walker (2 wheels);Minimal assistance   Toileting- Clothing Manipulation and Hygiene: Minimal assistance;Sit to/from stand       Functional mobility during ADLs: Rolling walker (2 wheels);Contact guard assist       Vision Baseline Vision/History: 3 Glaucoma Patient Visual Report: No change from baseline       Perception         Praxis  Pertinent Vitals/Pain Pain Assessment Pain Assessment: Faces Faces Pain Scale: Hurts little more Pain Location: L heel Pain Intervention(s): Monitored during session     Extremity/Trunk Assessment Upper Extremity Assessment Upper Extremity Assessment: Generalized weakness           Communication  Communication Communication: No apparent difficulties   Cognition Arousal: Alert Behavior During Therapy: WFL for tasks assessed/performed Cognition: No apparent impairments                               Following commands: Intact       Cueing  General Comments   Cueing Techniques: Verbal cues;Gestural cues      Exercises Other Exercises Other Exercises: Education on role of OT in Acute, Education on home safety, safe use of DME during ADL task engagement, fall risk management   Shoulder Instructions      Home Living Family/patient expects to be discharged to:: Private residence Living Arrangements: Alone Available Help at Discharge: Family;Available PRN/intermittently Type of Home: House Home Access: Ramped entrance     Home Layout: One level     Bathroom Shower/Tub: Producer, television/film/video: Standard Bathroom Accessibility: No (Pt reports bathroom is not accessible to her rollator and that she has to use wall and counter for furniture creeping.)   Home Equipment: Rollator (4 wheels);Cane - single point;BSC/3in1   Additional Comments: BSC placed at bedside at home      Prior Functioning/Environment Prior Level of Function : Independent/Modified Independent;History of Falls (last six months)             Mobility Comments: Mod Independent with ADLs, household mobilization, furniture walks in home environment.  Has has several falls in the last 71mo ADLs Comments: Mod I with ADLs, assistance from family with IADLs (cooking and laundry).  Has pet to care for (large dog - german shepherd dog).    OT Problem List: Decreased strength;Decreased activity tolerance;Decreased safety awareness;Decreased knowledge of use of DME or AE   OT Treatment/Interventions: Self-care/ADL training;Therapeutic activities;Therapeutic exercise;Energy conservation;Patient/family education;DME and/or AE instruction;Balance training      OT Goals(Current goals  can be found in the care plan section)   Acute Rehab OT Goals Patient Stated Goal: To return home OT Goal Formulation: With patient Time For Goal Achievement: 10/30/23 Potential to Achieve Goals: Good ADL Goals Pt Will Perform Grooming: with modified independence;standing Pt Will Perform Lower Body Dressing: with modified independence;sit to/from stand Pt Will Transfer to Toilet: with modified independence;bedside commode Pt Will Perform Toileting - Clothing Manipulation and hygiene: with modified independence;sit to/from stand   OT Frequency:  Min 2X/week    Co-evaluation PT/OT/SLP Co-Evaluation/Treatment: Yes Reason for Co-Treatment: To address functional/ADL transfers;Complexity of the patient's impairments (multi-system involvement) PT goals addressed during session: Mobility/safety with mobility OT goals addressed during session: ADL's and self-care;Proper use of Adaptive equipment and DME      AM-PAC OT 6 Clicks Daily Activity     Outcome Measure Help from another person eating meals?: None Help from another person taking care of personal grooming?: A Little Help from another person toileting, which includes using toliet, bedpan, or urinal?: A Little Help from another person bathing (including washing, rinsing, drying)?: A Little Help from another person to put on and taking off regular upper body clothing?: A Little Help from another person to put on and taking off regular lower body clothing?: A Little 6 Click Score: 19   End of  Session Equipment Utilized During Treatment: Gait belt;Rolling walker (2 wheels) Nurse Communication: Mobility status  Activity Tolerance: Patient tolerated treatment well Patient left: in bed;with bed alarm set;with call bell/phone within reach;with family/visitor present  OT Visit Diagnosis: Unsteadiness on feet (R26.81);Muscle weakness (generalized) (M62.81)                Time: 1456-1530 OT Time Calculation (min): 34 min Charges:  OT  General Charges $OT Visit: 1 Visit OT Evaluation $OT Eval Low Complexity: 1 Low OT Treatments $Self Care/Home Management : 8-22 mins  Harlene Sharps OTR/L   Harlene LITTIE Sharps 10/16/2023, 3:52 PM

## 2023-10-17 DIAGNOSIS — N1831 Chronic kidney disease, stage 3a: Secondary | ICD-10-CM | POA: Diagnosis not present

## 2023-10-17 DIAGNOSIS — Z720 Tobacco use: Secondary | ICD-10-CM

## 2023-10-17 DIAGNOSIS — I4891 Unspecified atrial fibrillation: Secondary | ICD-10-CM | POA: Diagnosis not present

## 2023-10-17 DIAGNOSIS — H109 Unspecified conjunctivitis: Secondary | ICD-10-CM | POA: Insufficient documentation

## 2023-10-17 DIAGNOSIS — I70222 Atherosclerosis of native arteries of extremities with rest pain, left leg: Secondary | ICD-10-CM

## 2023-10-17 DIAGNOSIS — H1031 Unspecified acute conjunctivitis, right eye: Secondary | ICD-10-CM | POA: Diagnosis not present

## 2023-10-17 DIAGNOSIS — M79672 Pain in left foot: Secondary | ICD-10-CM | POA: Diagnosis not present

## 2023-10-17 DIAGNOSIS — E876 Hypokalemia: Secondary | ICD-10-CM | POA: Insufficient documentation

## 2023-10-17 DIAGNOSIS — D62 Acute posthemorrhagic anemia: Secondary | ICD-10-CM

## 2023-10-17 DIAGNOSIS — E785 Hyperlipidemia, unspecified: Secondary | ICD-10-CM | POA: Diagnosis not present

## 2023-10-17 DIAGNOSIS — E162 Hypoglycemia, unspecified: Secondary | ICD-10-CM | POA: Diagnosis not present

## 2023-10-17 DIAGNOSIS — I10 Essential (primary) hypertension: Secondary | ICD-10-CM | POA: Diagnosis not present

## 2023-10-17 LAB — GLUCOSE, CAPILLARY
Glucose-Capillary: 170 mg/dL — ABNORMAL HIGH (ref 70–99)
Glucose-Capillary: 180 mg/dL — ABNORMAL HIGH (ref 70–99)
Glucose-Capillary: 189 mg/dL — ABNORMAL HIGH (ref 70–99)
Glucose-Capillary: 195 mg/dL — ABNORMAL HIGH (ref 70–99)
Glucose-Capillary: 208 mg/dL — ABNORMAL HIGH (ref 70–99)
Glucose-Capillary: 208 mg/dL — ABNORMAL HIGH (ref 70–99)
Glucose-Capillary: 269 mg/dL — ABNORMAL HIGH (ref 70–99)

## 2023-10-17 LAB — CBC
HCT: 27.5 % — ABNORMAL LOW (ref 36.0–46.0)
Hemoglobin: 8.8 g/dL — ABNORMAL LOW (ref 12.0–15.0)
MCH: 23.7 pg — ABNORMAL LOW (ref 26.0–34.0)
MCHC: 32 g/dL (ref 30.0–36.0)
MCV: 73.9 fL — ABNORMAL LOW (ref 80.0–100.0)
Platelets: 419 K/uL — ABNORMAL HIGH (ref 150–400)
RBC: 3.72 MIL/uL — ABNORMAL LOW (ref 3.87–5.11)
RDW: 20.8 % — ABNORMAL HIGH (ref 11.5–15.5)
WBC: 6.5 K/uL (ref 4.0–10.5)
nRBC: 0 % (ref 0.0–0.2)

## 2023-10-17 LAB — BASIC METABOLIC PANEL WITH GFR
Anion gap: 5 (ref 5–15)
BUN: 8 mg/dL (ref 8–23)
CO2: 27 mmol/L (ref 22–32)
Calcium: 8.6 mg/dL — ABNORMAL LOW (ref 8.9–10.3)
Chloride: 105 mmol/L (ref 98–111)
Creatinine, Ser: 1.02 mg/dL — ABNORMAL HIGH (ref 0.44–1.00)
GFR, Estimated: 55 mL/min — ABNORMAL LOW (ref >60)
Glucose, Bld: 201 mg/dL — ABNORMAL HIGH (ref 70–99)
Potassium: 3.8 mmol/L (ref 3.5–5.1)
Sodium: 137 mmol/L (ref 135–145)

## 2023-10-17 LAB — TYPE AND SCREEN
ABO/RH(D): O POS
Antibody Screen: NEGATIVE
Unit division: 0

## 2023-10-17 LAB — HEPARIN LEVEL (UNFRACTIONATED): Heparin Unfractionated: 0.79 [IU]/mL — ABNORMAL HIGH (ref 0.30–0.70)

## 2023-10-17 LAB — BPAM RBC
Blood Product Expiration Date: 202509172359
ISSUE DATE / TIME: 202508262309
Unit Type and Rh: 5100

## 2023-10-17 LAB — APTT: aPTT: 39 s — ABNORMAL HIGH (ref 24–36)

## 2023-10-17 LAB — MAGNESIUM: Magnesium: 1.9 mg/dL (ref 1.7–2.4)

## 2023-10-17 MED ORDER — CIPROFLOXACIN HCL 0.3 % OP SOLN
2.0000 [drp] | OPHTHALMIC | Status: DC
  Administered 2023-10-17 – 2023-10-20 (×14): 2 [drp] via OPHTHALMIC
  Filled 2023-10-17: qty 2.5

## 2023-10-17 MED ORDER — OXYCODONE HCL 5 MG PO TABS
5.0000 mg | ORAL_TABLET | Freq: Four times a day (QID) | ORAL | Status: DC | PRN
  Administered 2023-10-17 – 2023-10-19 (×5): 5 mg via ORAL
  Filled 2023-10-17 (×5): qty 1

## 2023-10-17 MED ORDER — HEPARIN (PORCINE) 25000 UT/250ML-% IV SOLN
1200.0000 [IU]/h | INTRAVENOUS | Status: DC
  Administered 2023-10-17: 1300 [IU]/h via INTRAVENOUS
  Administered 2023-10-18: 1200 [IU]/h via INTRAVENOUS
  Filled 2023-10-17 (×2): qty 250

## 2023-10-17 NOTE — Assessment & Plan Note (Signed)
 Smoking cessation recommended

## 2023-10-17 NOTE — Consult Note (Signed)
 [facility] Vascular Consult Note  MRN : [id number]  Wanda Gardner is a 83 y.o. (10-03-1940) female who presents with chief complaint of  Chief Complaint  Patient presents with   Fall  .   Consulting Physician: Charlie Sellar, MD Reason for consult: Rest pain History of Present Illness: Wanda Gardner is an 83 year old female who presented to [facility] due to having a syncopal event due to hypoglycemia.  She has a previous medical history of DVT and atrial flutter.  Upon workup it was noted that the patient was hypoglycemic.  She was also received 1 unit of blood due to low hemoglobin.  However she had no blood in her stool as she denies any black tarry stools as well.  She was previously seen in our office on 09/20/2023 by Bernardino pace, NP in regards to claudication-like symptoms that she was having.  At that time she was also having rest pain like symptoms.  ABIs that were done in our office showed a right ABI 0.84 and a left of 0.24.  She had a 0 TBI on the left as well.  She notes that her left leg pain has been worse since she has been in the hospital with cramping pain and discomfort in the left lower extremity.  Current Facility-Administered Medications  Medication Dose Route Frequency Provider Last Rate Last Admin   acetaminophen  (TYLENOL ) tablet 650 mg  650 mg Oral Q6H PRN Mansy, Jan A, MD       Or   acetaminophen  (TYLENOL ) suppository 650 mg  650 mg Rectal Q6H PRN Mansy, Jan A, MD       albuterol  (PROVENTIL ) (2.5 MG/3ML) 0.083% nebulizer solution 2.5 mg  2.5 mg Nebulization Q6H PRN Mansy, Jan A, MD       chlorthalidone  (HYGROTON ) tablet 25 mg  25 mg Oral Daily Mansy, Jan A, MD   25 mg at 10/17/23 9070   cholecalciferol  (VITAMIN D3) 25 MCG (1000 UNIT) tablet 5,000 Units  5,000 Units Oral Daily Mansy, Jan A, MD   5,000 Units at 10/17/23 9071   ciprofloxacin  (CILOXAN ) 0.3 % ophthalmic solution 2 drop  2 drop Right Eye Q4H while awake  Josette Charlie, MD   2 drop at 10/17/23 1450   cyanocobalamin  (VITAMIN B12) tablet 1,000 mcg  1,000 mcg Oral Daily Mansy, Jan A, MD   1,000 mcg at 10/17/23 9070   diclofenac  Sodium (VOLTAREN ) 1 % topical gel 2 g  2 g Topical BID PRN Mansy, Jan A, MD       dorzolamide -timolol  (COSOPT ) 2-0.5 % ophthalmic solution 1 drop  1 drop Both Eyes BID Mansy, Jan A, MD   1 drop at 10/17/23 9063   doxepin  (SINEQUAN ) capsule 10 mg  10 mg Oral QHS Mansy, Jan A, MD   10 mg at 10/16/23 2031   DULoxetine  (CYMBALTA ) DR capsule 60 mg  60 mg Oral Daily Mansy, Jan A, MD   60 mg at 10/17/23 9076   ferrous sulfate  tablet 325 mg  325 mg Oral Q breakfast Mansy, Jan A, MD   325 mg at 10/17/23 9071   fluticasone  (FLONASE ) 50 MCG/ACT nasal spray 2 spray  2 spray Each Nare Daily PRN Mansy, Jan A, MD       furosemide  (LASIX ) tablet 20 mg  20 mg Oral Daily Mansy, Jan A, MD   20 mg at 10/17/23 9070   insulin  aspart (novoLOG ) injection 0-6 Units  0-6 Units Subcutaneous TID WC Mansy, Jan A,  MD   1 Units at 10/17/23 1258   lactulose  (CHRONULAC ) 10 GM/15ML solution 10 g  10 g Oral BID PRN Mansy, Jan A, MD       losartan  (COZAAR ) tablet 50 mg  50 mg Oral Daily Mansy, Jan A, MD   50 mg at 10/17/23 0928   ondansetron  (ZOFRAN ) tablet 4 mg  4 mg Oral Q6H PRN Mansy, Jan A, MD       Or   ondansetron  (ZOFRAN ) injection 4 mg  4 mg Intravenous Q6H PRN Mansy, Jan A, MD       oxyCODONE  (Oxy IR/ROXICODONE ) immediate release tablet 5 mg  5 mg Oral Q6H PRN Josette Ade, MD   5 mg at 10/17/23 1259   pantoprazole  (PROTONIX ) EC tablet 40 mg  40 mg Oral Daily Mansy, Jan A, MD   40 mg at 10/17/23 9071   potassium chloride  SA (KLOR-CON  M) CR tablet 20 mEq  20 mEq Oral Daily Mansy, Jan A, MD   20 mEq at 10/17/23 9071   pravastatin  (PRAVACHOL ) tablet 40 mg  40 mg Oral Daily Mansy, Jan A, MD   40 mg at 10/17/23 9076   traMADol  (ULTRAM ) tablet 50 mg  50 mg Oral Q8H PRN Mansy, Jan A, MD       traZODone  (DESYREL ) tablet 25 mg  25 mg Oral QHS PRN Mansy,  Jan A, MD       triamcinolone  ointment (KENALOG ) 0.5 %   Topical BID Mansy, Madison LABOR, MD   Given at 10/17/23 9052    Past Medical History:  Diagnosis Date   Acid reflux    AKI (acute kidney injury) (HCC) 06/10/2022   Arthritis    Asthma    Chest pain, unspecified 10/30/2013   Formatting of this note might be different from the original. Note: Unchanged Note: Unchanged Formatting of this note might be different from the original. Note: Unchanged Note: Unchanged   Constipation 04/20/2011   Formatting of this note might be different from the original. Note: Unchanged - probably [facility]. Note: Unchanged - probably [facility].   COPD (chronic obstructive pulmonary disease) (HCC)    Cough 10/30/2013   Formatting of this note might be different from the original. Note: Unchanged Note: Unchanged   Depression    Diabetes mellitus (HCC) 01/10/2022   Diabetes mellitus without complication (HCC)    Dyspnea    Glaucoma    Headache    right side of head   Headache 07/25/2011   Hyperlipidemia    Hypertension    Knee stiff 05/12/2022   Lens replaced 03/22/2010   Neuropathic pain of hands and feet    PONV (postoperative nausea and vomiting) 1982   back with my hysterectomy.   Pseudophakia 03/22/2010   Sleep apnea    Doesn't use CPAP   Stiffness of shoulder joint 05/12/2022   Trigger finger of right hand 01/10/2022   Wears dentures    full upper and lower    Past Surgical History:  Procedure Laterality Date   ABDOMINAL HYSTERECTOMY     CATARACT EXTRACTION W/ INTRAOCULAR LENS  IMPLANT, BILATERAL     CATARACT EXTRACTION W/PHACO Left 12/14/2021   Procedure: CATARACT EXTRACTION PHACO AND INTRAOCULAR LENS PLACEMENT (IOC) LEFT DIABETIC 24.79 02:23.9;  Surgeon: Mittie Gaskin, MD;  Location: [facility];  Service: Ophthalmology;  Laterality: Left;  Diabetic   CHOLECYSTECTOMY     COLONOSCOPY WITH PROPOFOL  N/A 10/29/2014   Procedure: COLONOSCOPY WITH PROPOFOL ;  Surgeon: Deward CINDERELLA Piedmont, MD;  Location:  [facility];  Service: Gastroenterology;  Laterality: N/A;   ESOPHAGOGASTRODUODENOSCOPY (EGD) WITH PROPOFOL  N/A 10/29/2014   Procedure: ESOPHAGOGASTRODUODENOSCOPY (EGD) WITH PROPOFOL ;  Surgeon: Deward CINDERELLA Piedmont, MD;  Location: [facility];  Service: Gastroenterology;  Laterality: N/A;   EYE SURGERY Bilateral 2011   lid lift done 3 times   FOOT SURGERY     HAND SURGERY     TONGUE SURGERY     removal of cancer   TONSILLECTOMY     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/13/2016   Procedure: TOTAL HIP ARTHROPLASTY ANTERIOR APPROACH;  Surgeon: Ozell Flake, MD;  Location: [facility];  Service: Orthopedics;  Laterality: Right;   TOTAL KNEE ARTHROPLASTY Right 11/23/2015   Procedure: TOTAL KNEE ARTHROPLASTY;  Surgeon: Ozell Flake, MD;  Location: [facility];  Service: Orthopedics;  Laterality: Right;   VOCAL CORD LATERALIZATION, ENDOSCOPIC APPROACH W/ MLB      Social History Social History   Tobacco Use   Smoking status: Every Day    Current packs/day: 0.50    Average packs/day: 0.5 packs/day for 50.0 years (25.0 ttl pk-yrs)    Types: Cigarettes   Smokeless tobacco: Never  Vaping Use   Vaping status: Never Used  Substance Use Topics   Alcohol use: Yes    Alcohol/week: 0.0 standard drinks of alcohol    Comment: 1 glass of wine twice a year   Drug use: Yes    Types: Marijuana    Comment: weed- daily use for pain relief when she can get  it    Family History Family History  Problem Relation Age of Onset   Cancer Father    Heart disease Mother     No Known Allergies   REVIEW OF SYSTEMS (Negative unless checked)  Constitutional: [] Weight loss  [] Fever  [] Chills Cardiac: [] Chest pain   [] Chest pressure   [] Palpitations   [] Shortness of breath when laying flat   [] Shortness of breath at rest   [] Shortness of breath with exertion. Vascular:  [] Pain in legs with walking   [] Pain in legs at rest   [] Pain in legs when laying flat   [] Claudication   [] Pain in feet when walking  [] Pain in feet at rest   [] Pain in feet when laying flat   [] History of DVT   [] Phlebitis   [] Swelling in legs   [] Varicose veins   [] Non-healing ulcers Pulmonary:   [] Uses home oxygen   [] Productive cough   [] Hemoptysis   [] Wheeze  [] COPD   [] Asthma Neurologic:  [] Dizziness  [] Blackouts   [] Seizures   [] History of stroke   [] History of TIA  [] Aphasia   [] Temporary blindness   [] Dysphagia   [] Weakness or numbness in arms   [] Weakness or numbness in legs Musculoskeletal:  [] Arthritis   [] Joint swelling   [] Joint pain   [] Low back pain Hematologic:  [] Easy bruising  [] Easy bleeding   [] Hypercoagulable state   [] Anemic  [] Hepatitis Gastrointestinal:  [] Blood in stool   [] Vomiting blood  [] Gastroesophageal reflux/heartburn   [] Difficulty swallowing. Genitourinary:  [] Chronic kidney disease   [] Difficult urination  [] Frequent urination  [] Burning with urination   [] Blood in urine Skin:  [] Rashes   [] Ulcers   [] Wounds Psychological:  [] History of anxiety   []  History of major depression.  Physical Examination  Vitals:   10/16/23 2306 10/16/23 2330 10/17/23 0218 10/17/23 0822  BP: (!) 136/53 (!) 131/55 (!) 118/59 135/61  Pulse: 91 93 94 84  Resp: 16 17 18 17   Temp: 99.3 F (37.4 C) 99.8 F (  37.7 C) 100.2 F (37.9 C) 98.7 F (37.1 C)  TempSrc: Oral Oral Oral Oral  SpO2: 99% 99% 97% 100%  Weight:      Height:       Body mass index is 31.87 kg/m. Gen:  WD/WN, NAD Head: [place]/AT, No temporalis wasting. Prominent temp pulse not noted. Ear/Nose/Throat: Hearing grossly intact, nares w/o erythema or drainage, oropharynx w/o Erythema/Exudate Eyes: Sclera non-icteric, conjunctiva clear Neck: Trachea midline.  No JVD.  Pulmonary:  Good air movement, respirations not labored, equal bilaterally.  Cardiac: RRR, normal S1, S2. Vascular:  Vessel Right Left  PT Not Palpable Not Palpable  DP Not Palpable Not Palpable   Gastrointestinal: soft, non-tender/non-distended. No guarding/reflex.  Musculoskeletal: M/S 5/5 throughout.   Extremities without ischemic changes.  No deformity or atrophy. No edema. Neurologic: Sensation grossly intact in extremities.  Symmetrical.  Speech is fluent. Motor exam as listed above. Psychiatric: Judgment intact, Mood & affect appropriate for pt's clinical situation.    CBC Lab Results  Component Value Date   WBC 6.5 10/17/2023   HGB 8.8 (L) 10/17/2023   HCT 27.5 (L) 10/17/2023   MCV 73.9 (L) 10/17/2023   PLT 419 (H) 10/17/2023    BMET    Component Value Date/Time   NA 137 10/17/2023 0418   NA 141 12/14/2022 1032   NA 141 04/09/2011 0323   K 3.8 10/17/2023 0418   K 4.1 04/09/2011 0323   CL 105 10/17/2023 0418   CL 106 04/09/2011 0323   CO2 27 10/17/2023 0418   CO2 26 04/09/2011 0323   GLUCOSE 201 (H) 10/17/2023 0418   GLUCOSE 210 (H) 04/09/2011 0323   BUN 8 10/17/2023 0418   BUN 20 12/14/2022 1032   BUN 16 04/09/2011 0323   CREATININE 1.02 (H) 10/17/2023 0418   CREATININE 0.96 04/09/2011 0323   CALCIUM 8.6 (L) 10/17/2023 0418   CALCIUM 8.2 (L) 04/09/2011 0323   GFRNONAA 55 (L) 10/17/2023 0418   GFRNONAA >60 04/09/2011 0323   GFRAA >60 06/16/2016 1504   GFRAA >60 04/09/2011 0323   Estimated Creatinine Clearance: 50.5 mL/min (A) (by C-G formula based on SCr of 1.02 mg/dL (H)).  COAG Lab Results  Component Value Date   INR 1.4 (H) 06/10/2022   INR 1.00 06/06/2016   INR 0.96 11/10/2015    Radiology CT Head Wo Contrast Result Date: 10/15/2023 CLINICAL DATA:  fall EXAM: CT HEAD WITHOUT CONTRAST CT CERVICAL SPINE WITHOUT CONTRAST TECHNIQUE: Multidetector CT imaging of the head and cervical spine was performed following the standard protocol without intravenous contrast. Multiplanar CT image reconstructions of the cervical spine were also generated. RADIATION DOSE REDUCTION: This exam was performed according to the departmental dose-optimization program which includes automated exposure control, adjustment of the mA and/or kV according to patient size and/or use of  iterative reconstruction technique. COMPARISON:  CT angio chest 06/10/2022, FINDINGS: CT HEAD FINDINGS Brain: Patchy and confluent areas of decreased attenuation are noted throughout the deep and periventricular white matter of the cerebral hemispheres bilaterally, compatible with chronic microvascular ischemic disease. No evidence of large-territorial acute infarction. No parenchymal hemorrhage. No mass lesion. No extra-axial collection. No mass effect or midline shift. No hydrocephalus. Basilar cisterns are patent. Vascular: No hyperdense vessel. Atherosclerotic calcifications are present within the cavernous internal carotid arteries. Skull: No acute fracture or focal lesion. Right lamina papyracea fracture. Sinuses/Orbits: Paranasal sinuses and mastoid air cells are clear. Bilateral lens replacement. Otherwise the orbits are unremarkable. Other: None. CT CERVICAL SPINE FINDINGS Alignment: Normal.  Skull base and vertebrae: Multilevel moderate severe degenerative changes of the spine with associated multilevel moderate osseous neural foraminal stenosis. No severe osseous central canal stenosis. Multilevel cervical spine vertebral body height loss. No acute fracture. No aggressive appearing focal osseous lesion or focal pathologic process. Soft tissues and spinal canal: No prevertebral fluid or swelling. No visible canal hematoma. Upper chest: Stable 6 x 4 mm right apical pulmonary nodule-no further follow-up indicated. Other: None. IMPRESSION: 1. No acute intracranial abnormality. 2. No acute displaced fracture or traumatic listhesis of the cervical spine. Electronically Signed   By: Morgane  Naveau M.D.   On: 10/15/2023 18:42   CT Cervical Spine Wo Contrast Result Date: 10/15/2023 CLINICAL DATA:  fall EXAM: CT HEAD WITHOUT CONTRAST CT CERVICAL SPINE WITHOUT CONTRAST TECHNIQUE: Multidetector CT imaging of the head and cervical spine was performed following the standard protocol without intravenous contrast.  Multiplanar CT image reconstructions of the cervical spine were also generated. RADIATION DOSE REDUCTION: This exam was performed according to the departmental dose-optimization program which includes automated exposure control, adjustment of the mA and/or kV according to patient size and/or use of iterative reconstruction technique. COMPARISON:  CT angio chest 06/10/2022, FINDINGS: CT HEAD FINDINGS Brain: Patchy and confluent areas of decreased attenuation are noted throughout the deep and periventricular white matter of the cerebral hemispheres bilaterally, compatible with chronic microvascular ischemic disease. No evidence of large-territorial acute infarction. No parenchymal hemorrhage. No mass lesion. No extra-axial collection. No mass effect or midline shift. No hydrocephalus. Basilar cisterns are patent. Vascular: No hyperdense vessel. Atherosclerotic calcifications are present within the cavernous internal carotid arteries. Skull: No acute fracture or focal lesion. Right lamina papyracea fracture. Sinuses/Orbits: Paranasal sinuses and mastoid air cells are clear. Bilateral lens replacement. Otherwise the orbits are unremarkable. Other: None. CT CERVICAL SPINE FINDINGS Alignment: Normal. Skull base and vertebrae: Multilevel moderate severe degenerative changes of the spine with associated multilevel moderate osseous neural foraminal stenosis. No severe osseous central canal stenosis. Multilevel cervical spine vertebral body height loss. No acute fracture. No aggressive appearing focal osseous lesion or focal pathologic process. Soft tissues and spinal canal: No prevertebral fluid or swelling. No visible canal hematoma. Upper chest: Stable 6 x 4 mm right apical pulmonary nodule-no further follow-up indicated. Other: None. IMPRESSION: 1. No acute intracranial abnormality. 2. No acute displaced fracture or traumatic listhesis of the cervical spine. Electronically Signed   By: Morgane  Naveau M.D.   On:  10/15/2023 18:42   DG Chest Portable 1 View Result Date: 10/15/2023 CLINICAL DATA:  Status post fall. EXAM: PORTABLE CHEST 1 VIEW COMPARISON:  June 10, 2022 FINDINGS: The heart size and mediastinal contours are within normal limits. Low lung volumes are noted. Both lungs are clear. Chronic and degenerative changes are seen involving both shoulders. Multilevel degenerative changes are also present throughout the thoracic spine. IMPRESSION: Low lung volumes without active cardiopulmonary disease. Electronically Signed   By: Suzen Dials M.D.   On: 10/15/2023 16:17   DG Pelvis 1-2 Views Result Date: 10/15/2023 CLINICAL DATA:  Status post fall. EXAM: PELVIS - 1-2 VIEW COMPARISON:  None Available. 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 There is no evidence of an acute fracture or dislocation. No pelvic bone lesions are seen. Degenerative changes are seen within the left hip and visualized portion of the lower lumbar spine. IMPRESSION: 1. Intact total right hip replacement. 2. No acute fracture or dislocation. Electronically Signed   By: Suzen Dials M.D.   On: 10/15/2023 16:15  VAS US  ABI WITH/WO TBI Result Date: 09/20/2023  LOWER EXTREMITY DOPPLER STUDY Patient Name:  Wanda Gardner  Date of Exam:   09/19/2023 Medical Rec #: [id number]        Accession #:    [id number] Date of Birth: 28-Jan-1941        Patient Gender: F Patient Age:   30 years Exam Location:  [facility] Procedure:      VAS US  ABI WITH/WO TBI Referring Phys: --------------------------------------------------------------------------------  Indications: Claudication, and peripheral artery disease. High Risk Factors: Hyperlipidemia, Diabetes, current smoker.  Performing Technologist: Donnice Charnley RVT  Examination Guidelines: A complete evaluation includes at minimum, Doppler waveform signals and systolic blood pressure reading at the level of bilateral brachial, anterior tibial, and posterior tibial  arteries, when vessel segments are accessible. Bilateral testing is considered an integral part of a complete examination. Photoelectric Plethysmograph (PPG) waveforms and toe systolic pressure readings are included as required and additional duplex testing as needed. Limited examinations for reoccurring indications may be performed as noted.  ABI Findings: +---------+------------------+-----+----------+--------+ Right    Rt Pressure (mmHg)IndexWaveform  Comment  +---------+------------------+-----+----------+--------+ Brachial 148                                       +---------+------------------+-----+----------+--------+ PTA      124               0.84 monophasic         +---------+------------------+-----+----------+--------+ DP       116               0.78 monophasic         +---------+------------------+-----+----------+--------+ Great Toe69                0.47                    +---------+------------------+-----+----------+--------+ +---------+------------------+-----+----------+-------+ Left     Lt Pressure (mmHg)IndexWaveform  Comment +---------+------------------+-----+----------+-------+ Brachial 145                                      +---------+------------------+-----+----------+-------+ PTA      35                0.24 monophasic        +---------+------------------+-----+----------+-------+ PERO                            absent            +---------+------------------+-----+----------+-------+ DP       0                 0.00 absent            +---------+------------------+-----+----------+-------+ Great Toe0                 0.00                   +---------+------------------+-----+----------+-------+ +-------+-----------+-----------+------------+------------+ ABI/TBIToday's ABIToday's TBIPrevious ABIPrevious TBI +-------+-----------+-----------+------------+------------+ Right  0.84       0.47                                 +-------+-----------+-----------+------------+------------+ Left   0.24       0.00                                +-------+-----------+-----------+------------+------------+  Summary: Right: Resting right ankle-brachial index indicates mild right lower extremity arterial disease. The right toe-brachial index is abnormal. 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 The left toe-brachial index is abnormal. *See table(s) above for measurements and observations.  Electronically signed by Selinda Gu MD on 09/20/2023 at 8:24:54 AM.    Final    VAS US  LOWER EXTREMITY ARTERIAL DUPLEX Result Date: 09/20/2023 LOWER EXTREMITY ARTERIAL DUPLEX STUDY Patient Name:  Wanda Gardner  Date of Exam:   09/19/2023 Medical Rec #: [id number]        Accession #:    [id number] Date of Birth: 27-Jun-1940        Patient Gender: F Patient Age:   70 years Exam Location:  [facility] Procedure:      VAS US  LOWER EXTREMITY ARTERIAL DUPLEX Referring Phys: BRIEN PACE --------------------------------------------------------------------------------  Indications: Claudication, and peripheral artery disease. High Risk Factors: Hypertension, Diabetes, current smoker.  Current ABI: Right: 0.84, Left: 0.24 Performing Technologist: Donnice Charnley RVT  Examination Guidelines: A complete evaluation includes B-mode imaging, spectral Doppler, color Doppler, and power Doppler as needed of all accessible portions of each vessel. Bilateral testing is considered an integral part of a complete examination. Limited examinations for reoccurring indications may be performed as noted.  +----------+--------+-----+---------------+----------+--------+ RIGHT     PSV cm/sRatioStenosis       Waveform  Comments +----------+--------+-----+---------------+----------+--------+ CFA [id number]                         triphasic          +----------+--------+-----+---------------+----------+--------+ DFA        107                         biphasic           +----------+--------+-----+---------------+----------+--------+ SFA Prox  125                         monophasic         +----------+--------+-----+---------------+----------+--------+ SFA Mid   316          50-74% stenosisbiphasic           +----------+--------+-----+---------------+----------+--------+ SFA Distal184                         biphasic           +----------+--------+-----+---------------+----------+--------+ POP Prox  45                          biphasic           +----------+--------+-----+---------------+----------+--------+ POP Distal46                          biphasic           +----------+--------+-----+---------------+----------+--------+ ATA Distal27                          monophasic         +----------+--------+-----+---------------+----------+--------+ PTA Distal53                          monophasic         +----------+--------+-----+---------------+----------+--------+ A focal velocity elevation of 316 cm/s was obtained at Mid SFA with a VR of 3.9. Findings are characteristic of  50-74% stenosis.  +----------+--------+-----+--------+----------+--------------------+ LEFT      PSV cm/sRatioStenosisWaveform  Comments             +----------+--------+-----+--------+----------+--------------------+ CFA [id number]                  monophasicProximal obstruction +----------+--------+-----+--------+----------+--------------------+ DFA       74                   monophasic                     +----------+--------+-----+--------+----------+--------------------+ SFA Prox  43                   monophasic                     +----------+--------+-----+--------+----------+--------------------+ SFA Mid   28                   monophasic                     +----------+--------+-----+--------+----------+--------------------+ SFA Distal0            occluded                                +----------+--------+-----+--------+----------+--------------------+ POP Prox  37                   monophasic                     +----------+--------+-----+--------+----------+--------------------+ POP Distal53                   monophasic                     +----------+--------+-----+--------+----------+--------------------+ ATA Distal             occluded                               +----------+--------+-----+--------+----------+--------------------+ PTA Distal22                   monophasic                     +----------+--------+-----+--------+----------+--------------------+ A focal velocity elevation of was obtained at Distal SFA. 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 A 2nd focal velocity elevation was visualized, measuring at Anterior tibial. 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 Left: Total occlusion noted in the superficial femoral artery and/or popliteal artery. 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 Monophasic CFA suggests proximal iliac obstruction.  See table(s) above for measurements and observations. Electronically signed by Selinda Gu MD on 09/20/2023 at 8:24:08 AM.    Final       Assessment/Plan 1. 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  I suspect that her pain is worse because rest pain typically is worse with the patient's legs being elevated on a more consistent basis.  She was originally scheduled to have an angiogram of this lower extremity next week however based upon her current situation in the hospital as well as her significant worsening of rest pain symptoms I think it would be prudent to have her  move forward with angiogram while she is here in the hospital.  I have again discussed the risk, benefits and alternatives  with the patient and she is agreeable to proceed.  Will plan on doing this Friday.  We will still plan on having her subsequent right lower extremity done as an outpatient.  2. Essential hypertension, benign  She should continue antihypertensives as ordered  3.  Tobacco use Discussed with the patient that ongoing tobacco use catheter and the patency of any interventions that we do.  Smoking cessation is recommended.  Plan of care discussed with Dr. Jama and he is in agreement with plan noted above.   Family Communication: None at Bedside   Total Time:75 minutes  I spent 75 minutes in this encounter including personally reviewing extensive medical records, personally reviewing imaging studies and compared to prior scans, counseling the patient, placing orders, coordinating care and performing appropriate documentation  Thank you for allowing us  to participate in the care of this patient.   Janeice Stegall E Alois Colgan, NP [facility] [phone] (Office Phone) [phone] (Office Fax) [phone] (Pager)  10/17/2023 3:55 PM  Staff may message me via secure chat in Epic  but this may not receive immediate response,  please page for urgent matters!  Dictation software was used to generate the above note. Typos may occur and escape review, as with typed/written notes. Any error is purely unintentional.  Please contact me directly for clarity if needed.

## 2023-10-17 NOTE — Assessment & Plan Note (Addendum)
 Patient given 1 unit of packed red blood cells on 8/26.  Last hemoglobin 9.6.  Ferritin 4.  IV iron  given on 8/28.  Iron  deficiency anemia.  Refer to hematology for iron  infusions as outpatient.  Patient was advised to look for signs of bleeding being on Eliquis  and aspirin .

## 2023-10-17 NOTE — Progress Notes (Addendum)
 PHARMACY - ANTICOAGULATION CONSULT NOTE  Pharmacy Consult for heparin  drip Indication: foot pain   (hx DVT, afib on apixaban )  No Known Allergies  Patient Measurements: Height: 5' 7 (170.2 cm) Weight: 92.3 kg (203 lb 7.8 oz) IBW/kg (Calculated) : 61.6 HEPARIN  DW (KG): 81.6  Vital Signs: Temp: 98.7 F (37.1 C) (08/27 0822) Temp Source: Oral (08/27 0822) BP: 135/61 (08/27 0822) Pulse Rate: 84 (08/27 0822)  Labs: Recent Labs    10/15/23 1535 10/15/23 2209 10/16/23 0431 10/16/23 1329 10/17/23 0418  HGB 8.2*  --  6.9* 6.5* 8.8*  HCT 27.6*  --  22.4* 21.2* 27.5*  PLT 557*  --  447*  --  419*  CREATININE 0.97  --  0.88  --  1.02*  CKTOTAL 103  --   --   --   --   TROPONINIHS 6 9  --   --   --     Estimated Creatinine Clearance: 50.5 mL/min (A) (by C-G formula based on SCr of 1.02 mg/dL (H)).   Medical History: Past Medical History:  Diagnosis Date   Acid reflux    AKI (acute kidney injury) (HCC) 06/10/2022   Arthritis    Asthma    Chest pain, unspecified 10/30/2013   Formatting of this note might be different from the original. Note: Unchanged Note: Unchanged Formatting of this note might be different from the original. Note: Unchanged Note: Unchanged   Constipation 04/20/2011   Formatting of this note might be different from the original. Note: Unchanged - probably CIC. Note: Unchanged - probably [facility].   COPD (chronic obstructive pulmonary disease) (HCC)    Cough 10/30/2013   Formatting of this note might be different from the original. Note: Unchanged Note: Unchanged   Depression    Diabetes mellitus (HCC) 01/10/2022   Diabetes mellitus without complication (HCC)    Dyspnea    Glaucoma    Headache    right side of head   Headache 07/25/2011   Hyperlipidemia    Hypertension    Knee stiff 05/12/2022   Lens replaced 03/22/2010   Neuropathic pain of hands and feet    PONV (postoperative nausea and vomiting) 1982   back with my hysterectomy.   Pseudophakia  03/22/2010   Sleep apnea    Doesn't use CPAP   Stiffness of shoulder joint 05/12/2022   Trigger finger of right hand 01/10/2022   Wears dentures    full upper and lower    Medications:  Medications Prior to Admission  Medication Sig Dispense Refill Last Dose/Taking   albuterol  (VENTOLIN  HFA) 108 (90 Base) MCG/ACT inhaler Inhale 2 puffs into the lungs every 6 (six) hours as needed for wheezing or shortness of breath. 8 g 2 Unknown   apixaban  (ELIQUIS ) 5 MG TABS tablet Take 1 tablet (5 mg total) by mouth 2 (two) times daily. 60 tablet 3 10/14/2023 Morning   betamethasone , augmented, (DIPROLENE ) 0.05 % lotion Apply 1 Application topically 2 (two) times daily.   Unknown   chlorthalidone  (HYGROTON ) 25 MG tablet TAKE 1 TABLET BY MOUTH DAILY 90 tablet 2 10/14/2023   Cholecalciferol  (VITAMIN D3) 125 MCG (5000 UT) CAPS Take 1 capsule (5,000 Units total) by mouth daily. 90 capsule 1 10/14/2023   diclofenac  sodium (VOLTAREN ) 1 % GEL Apply 2 g topically 2 (two) times daily as needed (pain).    Unknown   docusate sodium  (COLACE) 100 MG capsule Take 1 capsule (100 mg total) by mouth 2 (two) times daily. 60 capsule 0 Unknown  dorzolamide -timolol  (COSOPT ) 2-0.5 % ophthalmic solution Place 1 drop into both eyes 2 (two) times daily.   10/14/2023   doxepin  (SINEQUAN ) 10 MG capsule TAKE 1 CAPSULE BY MOUTH AT BEDTIME 30 capsule 0 Past Week   DULoxetine  (CYMBALTA ) 60 MG capsule TAKE 1 CAPSULE BY MOUTH DAILY 90 capsule 2 10/14/2023   esomeprazole  (NEXIUM ) 40 MG capsule Take 1 capsule (40 mg total) by mouth daily. 90 capsule 3 10/14/2023   ferrous sulfate  325 (65 FE) MG tablet Take 325 mg by mouth daily with breakfast.   10/14/2023   fluticasone  (FLONASE ) 50 MCG/ACT nasal spray Place 2 sprays into both nostrils daily as needed for allergies. 16 g 11 Unknown   furosemide  (LASIX ) 20 MG tablet TAKE 1 TABLET BY MOUTH DAILY 90 tablet 1 10/14/2023   hydrOXYzine  (ATARAX ) 25 MG tablet Take 25 mg by mouth at bedtime as needed  (insomnia).   Unknown   insulin  glargine (LANTUS  SOLOSTAR) 100 UNIT/ML Solostar Pen Inject 30 Units into the skin at bedtime. 15 mL 2 10/13/2023   insulin  lispro (HUMALOG ) 100 UNIT/ML KwikPen Inject 10 Units into the skin 3 (three) times daily. (Patient taking differently: Inject 3-15 Units into the skin 3 (three) times daily.) 15 mL 2 10/14/2023 Morning   lactulose  (CHRONULAC ) 10 GM/15ML solution Take 15 mLs (10 g total) by mouth 2 (two) times daily as needed for mild constipation, moderate constipation or severe constipation. 236 mL 0 Unknown   losartan  (COZAAR ) 50 MG tablet TAKE 1 TABLET BY MOUTH DAILY 90 tablet 1 10/14/2023   metFORMIN  (GLUCOPHAGE ) 500 MG tablet Take 1 tablet (500 mg total) by mouth 2 (two) times daily. 180 tablet 1 10/14/2023   potassium chloride  SA (K-DUR,KLOR-CON ) 20 MEQ tablet Take 20 mEq by mouth daily.   10/14/2023   pravastatin  (PRAVACHOL ) 40 MG tablet TAKE 1 TABLET BY MOUTH DAILY 90 tablet 3 Past Week   traMADol  (ULTRAM ) 50 MG tablet TAKE 1 TABLET BY MOUTH EVERY 8 HOURS AS NEEDED 90 tablet 1 10/14/2023   vitamin B-12 (CYANOCOBALAMIN ) 1000 MCG tablet Take 1,000 mcg by mouth daily.   10/14/2023   Accu-Chek Softclix Lancets lancets Use with device to check sugars twice daily 100 each 12    glucose blood (ACCU-CHEK GUIDE TEST) test strip CHECK BLOOD SUGAR TWICE DAILY 200 each 1    Insulin  Pen Needle (BD PEN NEEDLE NANO U/F) 32G X 4 MM MISC 1 each by Does not apply route in the morning, at noon, in the evening, and at bedtime. 200 each 11    isosorbide  mononitrate (IMDUR ) 30 MG 24 hr tablet Take 1 tablet (30 mg total) by mouth daily. (Patient not taking: Reported on 10/15/2023) 90 tablet 1 Not Taking   polyethylene glycol powder (GLYCOLAX /MIRALAX ) powder 2 cap fulls in a full glass of water, two times a day, for 5 days. (Patient not taking: Reported on 10/15/2023) 255 g 0 Not Taking   prednisoLONE  acetate (PRED FORTE ) 1 % ophthalmic suspension Place 1 drop into the right eye 2 (two)  times daily. (Patient not taking: Reported on 10/15/2023)   Not Taking   Scheduled:   chlorthalidone   25 mg Oral Daily   cholecalciferol   5,000 Units Oral Daily   ciprofloxacin   2 drop Right Eye Q4H while awake   cyanocobalamin   1,000 mcg Oral Daily   dorzolamide -timolol   1 drop Both Eyes BID   doxepin   10 mg Oral QHS   DULoxetine   60 mg Oral Daily   ferrous sulfate   325 mg  Oral Q breakfast   furosemide   20 mg Oral Daily   insulin  aspart  0-6 Units Subcutaneous TID WC   losartan   50 mg Oral Daily   pantoprazole   40 mg Oral Daily   potassium chloride  SA  20 mEq Oral Daily   pravastatin   40 mg Oral Daily   triamcinolone  ointment   Topical BID   Infusions:   heparin       Assessment: 83 yo F to start heparin  drip for foot pain. On apixaban  PTA for hx DVT, aflutter. Planning angiogram 8/29.   Hx; DVT, aflutter on eliquis , t2dm, htn, hld, OA, asthma, gerd, low back pain, tobacco use (25 pack-yr hx), CAD  Last dose of apixaban  8/26 @ 0817 Baseline labs: Hgb 8.8  Plt 419  aPTT 39  HL 0.79   Goal of Therapy:  Heparin  level 0.3-0.7 units/ml aPTT 66-102 seconds Monitor platelets by anticoagulation protocol: Yes   Plan:  No bolus due to recent apixaban  and recent drop in Hgb to 6.5 (received 1 unit blood) Start heparin  infusion at 1300 units/hr Check aPTT level in 8 hours, follow aPTTs for now HL elevated due to apixaban , check HL tomorrow when an aPTT is drawn to assess for correlation. Once aPTT and HL correlating, switch to checking HL Continue to monitor H&H and platelets  Lititia Sen PharmD Clinical Pharmacist 10/17/2023

## 2023-10-17 NOTE — Progress Notes (Signed)
 Progress Note   Patient: Wanda Gardner FMW:[id number] DOB: 06-May-1940 DOA: 10/15/2023     0 DOS: the patient was seen and examined on 10/17/2023   Brief hospital course: 83 y.o. female with medical history significant for GERD, osteoarthritis, asthma, type 2 diabetes mellitus, hypertension, dyslipidemia, OSA and glaucoma, presented to the emergency room with acute onset of syncope with associated hypoglycemia.  She denied any nausea or vomiting or diarrhea or abdominal pain.  No fever or chills.  She stated she has not eaten anything today.  She is not sure if she took her Lantus  last night.  No chest pain or palpitations.  No cough or wheezing or dyspnea.  No dysuria, oliguria or hematuria or flank pain.   ED Course: When she came to the ER, BP was 138/93 with temperature 97.5 otherwise normal vital signs.  Labs revealed blood glucose of 164 and calcium 8.5 with albumin 3.2 and otherwise unremarkable CMP.  High-sensitivity troponin I was 609 and total CK was 103.  CBC showed anemia slightly worse than previous levels with microcytosis and thrombocytosis. EKG as reviewed by me : EKG showed atrial fibrillation with controlled ventricular sponsor of 63 and PVCs with low voltage QRS and anteroseptal Q waves. Imaging: Portable chest x-ray showed low lung volumes with no acute cardiopulmonary disease. Head and neck CT revealed no acute intracranial normality and no acute displaced fracture or traumatic listhesis of the cervical spine.   The patient was given an amp of D50 followed by D10 W at 100 mL/h.  She will be admitted to an observation medical telemetry bed for further evaluation and management.  8/26.  Patient given a unit of blood for hemoglobin dropping down to 6.9.  Eliquis  held 8/27.  Patient complaining of left foot pain.  Vascular team to do angiogram on Friday.  Heparin  drip started.  Need to watch hemoglobin closely.  Assessment and Plan: * Left foot pain Barely able to palpate pulses  with Doppler.  Likely ischemic pain.  Vascular was planning on doing an outpatient angiogram on September 2 or 3 but with her pain they will do it on Friday.  Heparin  drip ordered but will have to watch hemoglobin closely.  Hypoglycemia Discontinued D10 drip this morning.  Patient on sliding scale insulin .  Patient only eats 1 meal a day and likely will not need as much insulin  upon going home.  Acute blood loss anemia Patient given 1 unit of packed red blood cells on 8/26.  Today's hemoglobin 8.8.  Need to watch hemoglobin closely.  Dyslipidemia On pravastatin   CKD stage 3a, GFR 45-59 ml/min (HCC) Continue to monitor  Hypokalemia Replaced  Conjunctivitis, right eye Ciloxan  eyedrops  Essential hypertension On Cozaar  and Hygroton   Atrial fibrillation with controlled ventricular rate (HCC) Holding Eliquis .  Patient will be started on heparin  drip.  Tobacco abuse Smoking cessation recommended        Subjective: Patient with lots of complaints today including left foot and heel pain.  Patient stated she was scheduled to have an angiogram early September but now having more pain.  She was given a unit of blood yesterday and Eliquis  was held.  Will try to start heparin  drip and watch hemoglobin closely.  Patient complaining of drainage from the right eye.  Patient states she only eats 1 meal per day.  Physical Exam: Vitals:   10/16/23 2330 10/17/23 0218 10/17/23 0822 10/17/23 1642  BP: (!) 131/55 (!) 118/59 135/61 (!) 128/50  Pulse: 93 94 84 74  Resp: 17 18 17 16   Temp:  100.2 F (37.9 C) 98.7 F (37.1 C) 99.3 F (37.4 C)  TempSrc: Oral Oral Oral Oral  SpO2: 99% 97% 100% 97%  Weight:      Height:       Physical Exam HENT:     Head: Normocephalic.     Mouth/Throat:     Pharynx: No oropharyngeal exudate.  Eyes:     General: Lids are normal.     Conjunctiva/sclera: Conjunctivae normal.  Cardiovascular:     Rate and Rhythm: Normal rate and regular rhythm.     Heart  sounds: Normal heart sounds, S1 normal and S2 normal.  Pulmonary:     Breath sounds: No decreased breath sounds, wheezing, rhonchi or rales.  Abdominal:     Palpations: Abdomen is soft.     Tenderness: There is no abdominal tenderness.  Musculoskeletal:     Right lower leg: No swelling.     Left lower leg: No swelling.  Skin:    General: Skin is warm.     Findings: No rash.  Neurological:     Mental Status: She is alert and oriented to person, place, and time.     Data Reviewed:  Creatinine 1.02 with a GFR 55, hemoglobin 8.8, platelet count 419, white blood count 6.5  Permission to speak in front of family at bedside  Disposition: Status is: Observation Patient received a unit of blood yesterday and her Eliquis  was held.  Now having left foot pain likely ischemic.  Will start heparin  drip.  Vascular will do an angiogram on Friday.  Planned Discharge Destination: Home    Time spent: 28 minutes  Author: Charlie Patterson, MD 10/17/2023 5:17 PM  For on call review [email or web address].

## 2023-10-17 NOTE — Assessment & Plan Note (Signed)
 Replaced

## 2023-10-17 NOTE — Inpatient Diabetes Management (Addendum)
 Inpatient Diabetes Program Recommendations  AACE/ADA: New Consensus Statement on Inpatient Glycemic Control   Target Ranges:  Prepandial:   less than 140 mg/dL      Peak postprandial:   less than 180 mg/dL (1-2 hours)      Critically ill patients:  140 - 180 mg/dL    Latest Reference Range & Units 10/16/23 07:41 10/16/23 12:59 10/16/23 15:53 10/16/23 19:36 10/17/23 00:11 10/17/23 05:10 10/17/23 08:24  Glucose-Capillary 70 - 99 mg/dL 797 (H)  Novolog  2 units 263 (H)  Novolog  3 units 226 (H)  Novolog  2 units 280 (H) 208 (H) 195 (H) 189 (H)  Novolog  1 units   Review of Glycemic Control  Diabetes history: DM2 Outpatient Diabetes medications: Lantus  30 units at bedtime, Humalog  3-15 units TID with meals Current orders for Inpatient glycemic control: Novolog  0-6 units TID with meals   Inpatient Diabetes Program Recommendations:     Outpatient DM: Given patient had fall at home, noted to have CBG of 27 mg/dl with EMS (treated with D10), and CBG of 15 mg/dl at 80:58 on 1/74/74, would recommend to decrease outpatient Lantus  dose and Humalog  correction scale.  No basal insulin  given since arrival to hospital and CBG 189 this am.  May want to decrease Lantus  to 9 units Q24H (based on 92.3 kg x 0.1 unit) and Humalog  to 0-6 units TID with meals (based on same Novolog  correction scale that is currently ordered). Recommend patient follow up with PCP regarding DM management.  At discharge, please provide Rx for Baqsimi (nasal glucagon, [id number]).    NOTE: Diabetes coordinator spoke with patient on 10/16/23. Patient reports that she has Dexcom G7 sensors and receiver at home but has not used yet. Patient reports she had used FreeStyle Libre in the past and it did not stay on well. Discussed ways to help sensor stay on (cleaning site with soap/water, wiping with alcohol wipe, being sure skin is dry before placing sensor, using skin tac and tegaderm if needed).  Strongly encouraged patient to apply the  Dexcom G7 sensor when she gets home and start using the Dexcom receiver to monitor glucose. Discussed how the low and high glucose alarms work on CGM sensor and can alert her to a low prior to her glucose getting to low where she needs help with treating.   Thanks, Earnie Gainer, RN, MSN, CDCES Diabetes Coordinator Inpatient Diabetes Program [phone] (Team Pager from 8am to 5pm)

## 2023-10-17 NOTE — Assessment & Plan Note (Addendum)
 Creatinine 1.15 with a GFR of 60.

## 2023-10-17 NOTE — Assessment & Plan Note (Deleted)
 Barely able to palpate pulses with Doppler.  Likely ischemic pain.  Vascular was planning on doing an outpatient angiogram on September 2 or 3 but with her pain they will do it on Friday.  Heparin  drip ordered but will have to watch hemoglobin closely.

## 2023-10-17 NOTE — Plan of Care (Signed)

## 2023-10-17 NOTE — Assessment & Plan Note (Signed)
 Ciloxan eyedrops

## 2023-10-17 NOTE — Hospital Course (Addendum)
 83 y.o. female with medical history significant for GERD, osteoarthritis, asthma, type 2 diabetes mellitus, hypertension, dyslipidemia, OSA and glaucoma, presented to the emergency room with acute onset of syncope with associated hypoglycemia.  She denied any nausea or vomiting or diarrhea or abdominal pain.  No fever or chills.  She stated she has not eaten anything today.  She is not sure if she took her Lantus  last night.  No chest pain or palpitations.  No cough or wheezing or dyspnea.  No dysuria, oliguria or hematuria or flank pain.   ED Course: When she came to the ER, BP was 138/93 with temperature 97.5 otherwise normal vital signs.  Labs revealed blood glucose of 164 and calcium 8.5 with albumin 3.2 and otherwise unremarkable CMP.  High-sensitivity troponin I was 609 and total CK was 103.  CBC showed anemia slightly worse than previous levels with microcytosis and thrombocytosis. EKG as reviewed by me : EKG showed atrial fibrillation with controlled ventricular sponsor of 63 and PVCs with low voltage QRS and anteroseptal Q waves. Imaging: Portable chest x-ray showed low lung volumes with no acute cardiopulmonary disease. Head and neck CT revealed no acute intracranial normality and no acute displaced fracture or traumatic listhesis of the cervical spine.   The patient was given an amp of D50 followed by D10 W at 100 mL/h.  She will be admitted to an observation medical telemetry bed for further evaluation and management.  8/26.  Patient given a unit of blood for hemoglobin dropping down to 6.9.  Eliquis  held 8/27.  Patient complaining of left foot pain.  Vascular team to do angiogram on Friday.  Heparin  drip started.  Need to watch hemoglobin closely. 8/28.  Patient's foot feels a little less pain.  For angiogram tomorrow. 8/29.  Patient had bilateral iliac stents.  Creatinine 0.85 and hemoglobin 10.1. 8/30.  Cleared by vascular surgery to go home and follow-up as outpatient.  Hemoglobin 9.6  upon discharge and creatinine 0.95.  Patient will discharge home on 9 units of long-acting insulin  at night.

## 2023-10-18 DIAGNOSIS — H109 Unspecified conjunctivitis: Secondary | ICD-10-CM | POA: Diagnosis not present

## 2023-10-18 DIAGNOSIS — R531 Weakness: Secondary | ICD-10-CM | POA: Diagnosis not present

## 2023-10-18 DIAGNOSIS — I998 Other disorder of circulatory system: Secondary | ICD-10-CM | POA: Diagnosis not present

## 2023-10-18 DIAGNOSIS — I4891 Unspecified atrial fibrillation: Secondary | ICD-10-CM | POA: Diagnosis present

## 2023-10-18 DIAGNOSIS — E162 Hypoglycemia, unspecified: Secondary | ICD-10-CM | POA: Diagnosis present

## 2023-10-18 DIAGNOSIS — B9620 Unspecified Escherichia coli [E. coli] as the cause of diseases classified elsewhere: Secondary | ICD-10-CM | POA: Diagnosis present

## 2023-10-18 DIAGNOSIS — M79672 Pain in left foot: Secondary | ICD-10-CM | POA: Diagnosis not present

## 2023-10-18 DIAGNOSIS — I70222 Atherosclerosis of native arteries of extremities with rest pain, left leg: Secondary | ICD-10-CM | POA: Diagnosis not present

## 2023-10-18 DIAGNOSIS — Z794 Long term (current) use of insulin: Secondary | ICD-10-CM | POA: Diagnosis not present

## 2023-10-18 DIAGNOSIS — J4489 Other specified chronic obstructive pulmonary disease: Secondary | ICD-10-CM | POA: Diagnosis present

## 2023-10-18 DIAGNOSIS — E1122 Type 2 diabetes mellitus with diabetic chronic kidney disease: Secondary | ICD-10-CM | POA: Diagnosis present

## 2023-10-18 DIAGNOSIS — Z8249 Family history of ischemic heart disease and other diseases of the circulatory system: Secondary | ICD-10-CM | POA: Diagnosis not present

## 2023-10-18 DIAGNOSIS — I10 Essential (primary) hypertension: Secondary | ICD-10-CM | POA: Diagnosis not present

## 2023-10-18 DIAGNOSIS — Z23 Encounter for immunization: Secondary | ICD-10-CM | POA: Diagnosis present

## 2023-10-18 DIAGNOSIS — Z720 Tobacco use: Secondary | ICD-10-CM | POA: Diagnosis not present

## 2023-10-18 DIAGNOSIS — Z7984 Long term (current) use of oral hypoglycemic drugs: Secondary | ICD-10-CM | POA: Diagnosis not present

## 2023-10-18 DIAGNOSIS — I700 Atherosclerosis of aorta: Secondary | ICD-10-CM | POA: Diagnosis not present

## 2023-10-18 DIAGNOSIS — G8929 Other chronic pain: Secondary | ICD-10-CM | POA: Diagnosis present

## 2023-10-18 DIAGNOSIS — E876 Hypokalemia: Secondary | ICD-10-CM | POA: Diagnosis not present

## 2023-10-18 DIAGNOSIS — E1165 Type 2 diabetes mellitus with hyperglycemia: Secondary | ICD-10-CM | POA: Diagnosis present

## 2023-10-18 DIAGNOSIS — N390 Urinary tract infection, site not specified: Secondary | ICD-10-CM | POA: Diagnosis present

## 2023-10-18 DIAGNOSIS — H1031 Unspecified acute conjunctivitis, right eye: Secondary | ICD-10-CM | POA: Diagnosis not present

## 2023-10-18 DIAGNOSIS — F17210 Nicotine dependence, cigarettes, uncomplicated: Secondary | ICD-10-CM | POA: Diagnosis present

## 2023-10-18 DIAGNOSIS — Z7901 Long term (current) use of anticoagulants: Secondary | ICD-10-CM | POA: Diagnosis not present

## 2023-10-18 DIAGNOSIS — I70223 Atherosclerosis of native arteries of extremities with rest pain, bilateral legs: Secondary | ICD-10-CM | POA: Diagnosis present

## 2023-10-18 DIAGNOSIS — E1151 Type 2 diabetes mellitus with diabetic peripheral angiopathy without gangrene: Secondary | ICD-10-CM | POA: Diagnosis present

## 2023-10-18 DIAGNOSIS — D509 Iron deficiency anemia, unspecified: Secondary | ICD-10-CM | POA: Diagnosis present

## 2023-10-18 DIAGNOSIS — I129 Hypertensive chronic kidney disease with stage 1 through stage 4 chronic kidney disease, or unspecified chronic kidney disease: Secondary | ICD-10-CM | POA: Diagnosis present

## 2023-10-18 DIAGNOSIS — I70201 Unspecified atherosclerosis of native arteries of extremities, right leg: Secondary | ICD-10-CM | POA: Diagnosis not present

## 2023-10-18 DIAGNOSIS — I4892 Unspecified atrial flutter: Secondary | ICD-10-CM | POA: Diagnosis present

## 2023-10-18 DIAGNOSIS — Z96641 Presence of right artificial hip joint: Secondary | ICD-10-CM | POA: Diagnosis present

## 2023-10-18 DIAGNOSIS — D62 Acute posthemorrhagic anemia: Secondary | ICD-10-CM | POA: Diagnosis present

## 2023-10-18 DIAGNOSIS — E785 Hyperlipidemia, unspecified: Secondary | ICD-10-CM | POA: Diagnosis present

## 2023-10-18 DIAGNOSIS — Z66 Do not resuscitate: Secondary | ICD-10-CM | POA: Diagnosis present

## 2023-10-18 DIAGNOSIS — N1831 Chronic kidney disease, stage 3a: Secondary | ICD-10-CM | POA: Diagnosis present

## 2023-10-18 LAB — CBC
HCT: 27.5 % — ABNORMAL LOW (ref 36.0–46.0)
Hemoglobin: 8.7 g/dL — ABNORMAL LOW (ref 12.0–15.0)
MCH: 23.5 pg — ABNORMAL LOW (ref 26.0–34.0)
MCHC: 31.6 g/dL (ref 30.0–36.0)
MCV: 74.1 fL — ABNORMAL LOW (ref 80.0–100.0)
Platelets: 402 K/uL — ABNORMAL HIGH (ref 150–400)
RBC: 3.71 MIL/uL — ABNORMAL LOW (ref 3.87–5.11)
RDW: 20.6 % — ABNORMAL HIGH (ref 11.5–15.5)
WBC: 7.3 K/uL (ref 4.0–10.5)
nRBC: 0 % (ref 0.0–0.2)

## 2023-10-18 LAB — APTT
aPTT: 103 s — ABNORMAL HIGH (ref 24–36)
aPTT: 94 s — ABNORMAL HIGH (ref 24–36)
aPTT: 175 s (ref 24–36)

## 2023-10-18 LAB — BASIC METABOLIC PANEL WITH GFR
Anion gap: 12 (ref 5–15)
BUN: 9 mg/dL (ref 8–23)
CO2: 25 mmol/L (ref 22–32)
Calcium: 8.8 mg/dL — ABNORMAL LOW (ref 8.9–10.3)
Chloride: 99 mmol/L (ref 98–111)
Creatinine, Ser: 1.07 mg/dL — ABNORMAL HIGH (ref 0.44–1.00)
GFR, Estimated: 52 mL/min — ABNORMAL LOW (ref >60)
Glucose, Bld: 211 mg/dL — ABNORMAL HIGH (ref 70–99)
Potassium: 4 mmol/L (ref 3.5–5.1)
Sodium: 136 mmol/L (ref 135–145)

## 2023-10-18 LAB — HEPARIN LEVEL (UNFRACTIONATED): Heparin Unfractionated: 0.83 [IU]/mL — ABNORMAL HIGH (ref 0.30–0.70)

## 2023-10-18 LAB — GLUCOSE, CAPILLARY
Glucose-Capillary: 229 mg/dL — ABNORMAL HIGH (ref 70–99)
Glucose-Capillary: 286 mg/dL — ABNORMAL HIGH (ref 70–99)
Glucose-Capillary: 254 mg/dL — ABNORMAL HIGH (ref 70–99)
Glucose-Capillary: 174 mg/dL — ABNORMAL HIGH (ref 70–99)
Glucose-Capillary: 231 mg/dL — ABNORMAL HIGH (ref 70–99)
Glucose-Capillary: 217 mg/dL — ABNORMAL HIGH (ref 70–99)

## 2023-10-18 LAB — URINE CULTURE: Culture: >=100000 — AB

## 2023-10-18 LAB — URIC ACID: Uric Acid, Serum: 5.4 mg/dL (ref 2.5–7.1)

## 2023-10-18 MED ORDER — HEPARIN (PORCINE) 25000 UT/250ML-% IV SOLN
1100.0000 [IU]/h | INTRAVENOUS | Status: DC
  Administered 2023-10-18: 1000 [IU]/h via INTRAVENOUS

## 2023-10-18 MED ORDER — INSULIN GLARGINE 100 UNIT/ML ~~LOC~~ SOLN
6.0000 [IU] | Freq: Every day | SUBCUTANEOUS | Status: DC
  Administered 2023-10-18 – 2023-10-19 (×2): 6 [IU] via SUBCUTANEOUS
  Filled 2023-10-18 (×3): qty 0.06

## 2023-10-18 MED ORDER — DIPHENHYDRAMINE HCL 50 MG/ML IJ SOLN: 50.0000 mg | Freq: Once | INTRAMUSCULAR | Status: DC | PRN

## 2023-10-18 MED ORDER — NITROFURANTOIN MONOHYD MACRO 100 MG PO CAPS
100.0000 mg | ORAL_CAPSULE | Freq: Two times a day (BID) | ORAL | Status: DC
  Administered 2023-10-18 – 2023-10-20 (×4): 100 mg via ORAL
  Filled 2023-10-18 (×4): qty 1

## 2023-10-18 MED ORDER — CEFAZOLIN SODIUM-DEXTROSE 2-4 GM/100ML-% IV SOLN
2.0000 g | INTRAVENOUS | Status: AC
  Administered 2023-10-19: 2 g via INTRAVENOUS
  Filled 2023-10-18: qty 100

## 2023-10-18 MED ORDER — METHYLPREDNISOLONE SODIUM SUCC 125 MG IJ SOLR: 125.0000 mg | Freq: Once | INTRAMUSCULAR | Status: DC | PRN

## 2023-10-18 MED ORDER — IRON SUCROSE 300 MG IVPB - SIMPLE MED
300.0000 mg | Freq: Once | Status: AC
  Administered 2023-10-18: 300 mg via INTRAVENOUS
  Filled 2023-10-18: qty 300

## 2023-10-18 MED ORDER — LOSARTAN POTASSIUM 25 MG PO TABS
25.0000 mg | ORAL_TABLET | Freq: Every day | ORAL | Status: DC
  Administered 2023-10-19 – 2023-10-20 (×2): 25 mg via ORAL
  Filled 2023-10-18 (×2): qty 1

## 2023-10-18 MED ORDER — SODIUM CHLORIDE 0.9 % IV BOLUS
500.0000 mL | Freq: Once | INTRAVENOUS | Status: AC
  Administered 2023-10-18: 500 mL via INTRAVENOUS

## 2023-10-18 MED ORDER — FAMOTIDINE 20 MG PO TABS: 40.0000 mg | ORAL_TABLET | Freq: Once | ORAL | Status: DC | PRN

## 2023-10-18 MED ORDER — INSULIN GLARGINE 100 UNIT/ML ~~LOC~~ SOLN
5.0000 [IU] | Freq: Every day | SUBCUTANEOUS | Status: DC
  Filled 2023-10-18: qty 0.05

## 2023-10-18 MED ORDER — MIDAZOLAM HCL 2 MG/ML PO SYRP
8.0000 mg | ORAL_SOLUTION | Freq: Once | ORAL | Status: DC | PRN
  Filled 2023-10-18: qty 5

## 2023-10-18 MED ORDER — HYDROMORPHONE HCL 1 MG/ML IJ SOLN
1.0000 mg | Freq: Once | INTRAMUSCULAR | Status: AC | PRN
  Administered 2023-10-19: 1 mg via INTRAVENOUS
  Filled 2023-10-18: qty 1

## 2023-10-18 MED ORDER — SODIUM CHLORIDE 0.9 % IV SOLN: INTRAVENOUS | Status: DC

## 2023-10-18 NOTE — Progress Notes (Signed)
 Occupational Therapy Treatment Patient Details Name: Wanda Gardner MRN: [id number] DOB: 06-09-1940 Today's Date: 10/18/2023   History of present illness Pt is an 83 y.o. female with medical history significant for GERD, osteoarthritis, asthma, type 2 diabetes mellitus, hypertension, dyslipidemia, OSA and glaucoma, presented to the emergency room with acute onset of syncope with associated hypoglycemia.  Ik:Ybenhobrzfpj, Essential hypertension, Atrial fibrillation with controlled ventricular rate, Dyslipidemia.   OT comments  Pt seen for OT treatment on this date. Upon arrival to room pt supine in bed with HOB elevated, agreeable to tx. Pt to have angiogram on Friday to LLE and is now on heparin  IV drip.  No current symptoms noted at the start of or during session.  Pt requires Supervision for supine to sit EOB and completed grooming/hygiene and oral care tasks at EOB with set-up. Education on recommendation to spend time OOB and pt requested to remain sitting EOB at end of session. Pt making good progress toward goals, will continue to follow POC. Discharge recommendation remains appropriate.        If plan is discharge home, recommend the following:  A little help with walking and/or transfers;A little help with bathing/dressing/bathroom;Assistance with cooking/housework   Equipment Recommendations  Other (comment) (RW)    Recommendations for Other Services      Precautions / Restrictions Precautions Precautions: Fall Restrictions Weight Bearing Restrictions Per Provider Order: No       Mobility Bed Mobility Overal bed mobility: Needs Assistance Bed Mobility: Supine to Sit     Supine to sit: Supervision, HOB elevated, Used rails          Transfers                         Balance Overall balance assessment: Needs assistance Sitting-balance support: Single extremity supported Sitting balance-Leahy Scale: Good Sitting balance - Comments: Sitting to completed  ADLs at EOB                                   ADL either performed or assessed with clinical judgement   ADL Overall ADL's : Needs assistance/impaired     Grooming: Wash/dry face;Wash/dry hands;Oral care;Brushing hair;Sitting                                      Extremity/Trunk Assessment Upper Extremity Assessment Upper Extremity Assessment: Generalized weakness            Vision Baseline Vision/History: 3 Glaucoma Patient Visual Report: No change from baseline     Perception     Praxis     Communication Communication Communication: No apparent difficulties   Cognition Arousal: Alert Behavior During Therapy: WFL for tasks assessed/performed Cognition: No apparent impairments                               Following commands: Intact        Cueing   Cueing Techniques: Verbal cues  Exercises Other Exercises Other Exercises: Education on benefits of spending time OOB to promote functional conditioning/activity tolerance.    Shoulder Instructions       General Comments      Pertinent Vitals/ Pain       Pain Assessment Pain Assessment: No/denies pain Pain Score: 0-No pain Pain Location:  No pain reported during session while seated EOB  Home Living                                          Prior Functioning/Environment              Frequency  Min 2X/week        Progress Toward Goals  OT Goals(current goals can now be found in the care plan section)  Progress towards OT goals: Progressing toward goals  Acute Rehab OT Goals Potential to Achieve Goals: Good  Plan      Co-evaluation                 AM-PAC OT 6 Clicks Daily Activity     Outcome Measure   Help from another person eating meals?: None Help from another person taking care of personal grooming?: A Little Help from another person toileting, which includes using toliet, bedpan, or urinal?: A Little Help from  another person bathing (including washing, rinsing, drying)?: A Little Help from another person to put on and taking off regular upper body clothing?: A Little Help from another person to put on and taking off regular lower body clothing?: A Little 6 Click Score: 19    End of Session    OT Visit Diagnosis: Unsteadiness on feet (R26.81);Muscle weakness (generalized) (M62.81)   Activity Tolerance Patient tolerated treatment well   Patient Left in bed;with call bell/phone within reach (EOB per pt request)   Nurse Communication Mobility status        Time: 9181-9158 OT Time Calculation (min): 23 min  Charges: OT General Charges $OT Visit: 1 Visit OT Treatments $Self Care/Home Management : 23-37 mins  Harlene Sharps OTR/L   Harlene LITTIE Sharps 10/18/2023, 10:07 AM

## 2023-10-18 NOTE — Progress Notes (Signed)
 Patient is not able to walk the distance required to go the bathroom, or he/she is unable to safely negotiate stairs required to access the bathroom.  A 3in1 BSC will alleviate this problem

## 2023-10-18 NOTE — Progress Notes (Signed)
 PHARMACY - ANTICOAGULATION CONSULT NOTE  Pharmacy Consult for heparin  drip Indication: foot pain   (hx DVT, afib on apixaban )  No Known Allergies  Patient Measurements: Height: 5' 7 (170.2 cm) Weight: 92.3 kg (203 lb 7.8 oz) IBW/kg (Calculated) : 61.6 HEPARIN  DW (KG): 81.6  Vital Signs: Temp: 98.4 F (36.9 C) (08/28 0744) Temp Source: Oral (08/28 0744) BP: 126/44 (08/28 0744) Pulse Rate: 75 (08/28 0744)  Labs: Recent Labs    10/15/23 1535 10/15/23 2209 10/16/23 0431 10/16/23 1329 10/17/23 0418 10/17/23 1648 10/18/23 0157  HGB 8.2*  --  6.9* 6.5* 8.8*  --  8.7*  HCT 27.6*  --  22.4* 21.2* 27.5*  --  27.5*  PLT 557*  --  447*  --  419*  --  402*  APTT  --   --   --   --   --  39* 103*  HEPARINUNFRC  --   --   --   --   --  0.79*  --   CREATININE 0.97  --  0.88  --  1.02*  --  1.07*  CKTOTAL 103  --   --   --   --   --   --   TROPONINIHS 6 9  --   --   --   --   --     Estimated Creatinine Clearance: 48.1 mL/min (A) (by C-G formula based on SCr of 1.07 mg/dL (H)).   Medical History: Past Medical History:  Diagnosis Date   Acid reflux    AKI (acute kidney injury) (HCC) 06/10/2022   Arthritis    Asthma    Chest pain, unspecified 10/30/2013   Formatting of this note might be different from the original. Note: Unchanged Note: Unchanged Formatting of this note might be different from the original. Note: Unchanged Note: Unchanged   Constipation 04/20/2011   Formatting of this note might be different from the original. Note: Unchanged - probably CIC. Note: Unchanged - probably CIC.   COPD (chronic obstructive pulmonary disease) (HCC)    Cough 10/30/2013   Formatting of this note might be different from the original. Note: Unchanged Note: Unchanged   Depression    Diabetes mellitus (HCC) 01/10/2022   Diabetes mellitus without complication (HCC)    Dyspnea    Glaucoma    Headache    right side of head   Headache 07/25/2011   Hyperlipidemia    Hypertension     Knee stiff 05/12/2022   Lens replaced 03/22/2010   Neuropathic pain of hands and feet    PONV (postoperative nausea and vomiting) 1982   back with my hysterectomy.   Pseudophakia 03/22/2010   Sleep apnea    Doesn't use CPAP   Stiffness of shoulder joint 05/12/2022   Trigger finger of right hand 01/10/2022   Wears dentures    full upper and lower    Medications:  Medications Prior to Admission  Medication Sig Dispense Refill Last Dose/Taking   albuterol  (VENTOLIN  HFA) 108 (90 Base) MCG/ACT inhaler Inhale 2 puffs into the lungs every 6 (six) hours as needed for wheezing or shortness of breath. 8 g 2 Unknown   apixaban  (ELIQUIS ) 5 MG TABS tablet Take 1 tablet (5 mg total) by mouth 2 (two) times daily. 60 tablet 3 10/14/2023 Morning   betamethasone , augmented, (DIPROLENE ) 0.05 % lotion Apply 1 Application topically 2 (two) times daily.   Unknown   chlorthalidone  (HYGROTON ) 25 MG tablet TAKE 1 TABLET BY MOUTH DAILY 90 tablet 2  10/14/2023   Cholecalciferol  (VITAMIN D3) 125 MCG (5000 UT) CAPS Take 1 capsule (5,000 Units total) by mouth daily. 90 capsule 1 10/14/2023   diclofenac  sodium (VOLTAREN ) 1 % GEL Apply 2 g topically 2 (two) times daily as needed (pain).    Unknown   docusate sodium  (COLACE) 100 MG capsule Take 1 capsule (100 mg total) by mouth 2 (two) times daily. 60 capsule 0 Unknown   dorzolamide -timolol  (COSOPT ) 2-0.5 % ophthalmic solution Place 1 drop into both eyes 2 (two) times daily.   10/14/2023   doxepin  (SINEQUAN ) 10 MG capsule TAKE 1 CAPSULE BY MOUTH AT BEDTIME 30 capsule 0 Past Week   DULoxetine  (CYMBALTA ) 60 MG capsule TAKE 1 CAPSULE BY MOUTH DAILY 90 capsule 2 10/14/2023   esomeprazole  (NEXIUM ) 40 MG capsule Take 1 capsule (40 mg total) by mouth daily. 90 capsule 3 10/14/2023   ferrous sulfate  325 (65 FE) MG tablet Take 325 mg by mouth daily with breakfast.   10/14/2023   fluticasone  (FLONASE ) 50 MCG/ACT nasal spray Place 2 sprays into both nostrils daily as needed for allergies.  16 g 11 Unknown   furosemide  (LASIX ) 20 MG tablet TAKE 1 TABLET BY MOUTH DAILY 90 tablet 1 10/14/2023   hydrOXYzine  (ATARAX ) 25 MG tablet Take 25 mg by mouth at bedtime as needed (insomnia).   Unknown   insulin  glargine (LANTUS  SOLOSTAR) 100 UNIT/ML Solostar Pen Inject 30 Units into the skin at bedtime. 15 mL 2 10/13/2023   insulin  lispro (HUMALOG ) 100 UNIT/ML KwikPen Inject 10 Units into the skin 3 (three) times daily. (Patient taking differently: Inject 3-15 Units into the skin 3 (three) times daily.) 15 mL 2 10/14/2023 Morning   lactulose  (CHRONULAC ) 10 GM/15ML solution Take 15 mLs (10 g total) by mouth 2 (two) times daily as needed for mild constipation, moderate constipation or severe constipation. 236 mL 0 Unknown   losartan  (COZAAR ) 50 MG tablet TAKE 1 TABLET BY MOUTH DAILY 90 tablet 1 10/14/2023   metFORMIN  (GLUCOPHAGE ) 500 MG tablet Take 1 tablet (500 mg total) by mouth 2 (two) times daily. 180 tablet 1 10/14/2023   potassium chloride  SA (K-DUR,KLOR-CON ) 20 MEQ tablet Take 20 mEq by mouth daily.   10/14/2023   pravastatin  (PRAVACHOL ) 40 MG tablet TAKE 1 TABLET BY MOUTH DAILY 90 tablet 3 Past Week   traMADol  (ULTRAM ) 50 MG tablet TAKE 1 TABLET BY MOUTH EVERY 8 HOURS AS NEEDED 90 tablet 1 10/14/2023   vitamin B-12 (CYANOCOBALAMIN ) 1000 MCG tablet Take 1,000 mcg by mouth daily.   10/14/2023   Accu-Chek Softclix Lancets lancets Use with device to check sugars twice daily 100 each 12    glucose blood (ACCU-CHEK GUIDE TEST) test strip CHECK BLOOD SUGAR TWICE DAILY 200 each 1    Insulin  Pen Needle (BD PEN NEEDLE NANO U/F) 32G X 4 MM MISC 1 each by Does not apply route in the morning, at noon, in the evening, and at bedtime. 200 each 11    isosorbide  mononitrate (IMDUR ) 30 MG 24 hr tablet Take 1 tablet (30 mg total) by mouth daily. (Patient not taking: Reported on 10/15/2023) 90 tablet 1 Not Taking   polyethylene glycol powder (GLYCOLAX /MIRALAX ) powder 2 cap fulls in a full glass of water, two times a day,  for 5 days. (Patient not taking: Reported on 10/15/2023) 255 g 0 Not Taking   prednisoLONE  acetate (PRED FORTE ) 1 % ophthalmic suspension Place 1 drop into the right eye 2 (two) times daily. (Patient not taking: Reported on 10/15/2023)  Not Taking   Scheduled:   cholecalciferol   5,000 Units Oral Daily   ciprofloxacin   2 drop Right Eye Q4H while awake   cyanocobalamin   1,000 mcg Oral Daily   dorzolamide -timolol   1 drop Both Eyes BID   doxepin   10 mg Oral QHS   DULoxetine   60 mg Oral Daily   ferrous sulfate   325 mg Oral Q breakfast   furosemide   20 mg Oral Daily   insulin  aspart  0-6 Units Subcutaneous TID WC   losartan   50 mg Oral Daily   pantoprazole   40 mg Oral Daily   potassium chloride  SA  20 mEq Oral Daily   pravastatin   40 mg Oral Daily   triamcinolone  ointment   Topical BID   Infusions:   heparin  1,200 Units/hr (10/18/23 0345)    Assessment: 83 yo F to start heparin  drip for foot pain. On apixaban  PTA for hx DVT, aflutter. Planning angiogram 8/29.   Hx; DVT, aflutter on eliquis , t2dm, htn, hld, OA, asthma, gerd, low back pain, tobacco use (25 pack-yr hx), CAD  Last dose of apixaban  8/26 @ 0817 8/27 Baseline labs:  Hgb 8.7>8.8 (BL 9-10)      Plt 419>402 (BL ~350)  aPTT 39  HL 0.79   Goal of Therapy:  Heparin  level 0.3-0.7 units/ml aPTT 66-102 seconds Monitor platelets by anticoagulation protocol: Yes  8/28@0157 : aPTT 103, SUPRAtherapeutic; 1300 units/hr 8/28@1201 : aPTT  94,  therapeutic x1                        HL 0.83,  no correlation  Plan:  aPTT at goal, continue 1200 units/hr Check aPTT in 8 hours to monitor  Plan to check HL with AM labs and correlate with aPTT Once confirm correlation, plan to monitor with HL Continue to monitor H&H and platelets  Leonor JAYSON Argyle, PharmD Clinical Pharmacist 10/18/2023 9:35 AM

## 2023-10-18 NOTE — Progress Notes (Signed)
 PHARMACY - ANTICOAGULATION CONSULT NOTE  Pharmacy Consult for heparin  drip Indication: foot pain   (hx DVT, afib on apixaban )  No Known Allergies  Patient Measurements: Height: 5' 7 (170.2 cm) Weight: 92.3 kg (203 lb 7.8 oz) IBW/kg (Calculated) : 61.6 HEPARIN  DW (KG): 81.6  Vital Signs: Temp: 98.5 F (36.9 C) (08/27 1955) Temp Source: Oral (08/27 1955) BP: 116/77 (08/27 1955) Pulse Rate: 89 (08/27 1955)  Labs: Recent Labs    10/15/23 1535 10/15/23 2209 10/16/23 0431 10/16/23 1329 10/17/23 0418 10/17/23 1648 10/18/23 0157  HGB 8.2*  --  6.9* 6.5* 8.8*  --  8.7*  HCT 27.6*  --  22.4* 21.2* 27.5*  --  27.5*  PLT 557*  --  447*  --  419*  --  402*  APTT  --   --   --   --   --  39* 103*  HEPARINUNFRC  --   --   --   --   --  0.79*  --   CREATININE 0.97  --  0.88  --  1.02*  --  1.07*  CKTOTAL 103  --   --   --   --   --   --   TROPONINIHS 6 9  --   --   --   --   --     Estimated Creatinine Clearance: 48.1 mL/min (A) (by C-G formula based on SCr of 1.07 mg/dL (H)).   Medical History: Past Medical History:  Diagnosis Date   Acid reflux    AKI (acute kidney injury) (HCC) 06/10/2022   Arthritis    Asthma    Chest pain, unspecified 10/30/2013   Formatting of this note might be different from the original. Note: Unchanged Note: Unchanged Formatting of this note might be different from the original. Note: Unchanged Note: Unchanged   Constipation 04/20/2011   Formatting of this note might be different from the original. Note: Unchanged - probably CIC. Note: Unchanged - probably CIC.   COPD (chronic obstructive pulmonary disease) (HCC)    Cough 10/30/2013   Formatting of this note might be different from the original. Note: Unchanged Note: Unchanged   Depression    Diabetes mellitus (HCC) 01/10/2022   Diabetes mellitus without complication (HCC)    Dyspnea    Glaucoma    Headache    right side of head   Headache 07/25/2011   Hyperlipidemia    Hypertension     Knee stiff 05/12/2022   Lens replaced 03/22/2010   Neuropathic pain of hands and feet    PONV (postoperative nausea and vomiting) 1982   back with my hysterectomy.   Pseudophakia 03/22/2010   Sleep apnea    Doesn't use CPAP   Stiffness of shoulder joint 05/12/2022   Trigger finger of right hand 01/10/2022   Wears dentures    full upper and lower    Medications:  Medications Prior to Admission  Medication Sig Dispense Refill Last Dose/Taking   albuterol  (VENTOLIN  HFA) 108 (90 Base) MCG/ACT inhaler Inhale 2 puffs into the lungs every 6 (six) hours as needed for wheezing or shortness of breath. 8 g 2 Unknown   apixaban  (ELIQUIS ) 5 MG TABS tablet Take 1 tablet (5 mg total) by mouth 2 (two) times daily. 60 tablet 3 10/14/2023 Morning   betamethasone , augmented, (DIPROLENE ) 0.05 % lotion Apply 1 Application topically 2 (two) times daily.   Unknown   chlorthalidone  (HYGROTON ) 25 MG tablet TAKE 1 TABLET BY MOUTH DAILY 90 tablet 2  10/14/2023   Cholecalciferol  (VITAMIN D3) 125 MCG (5000 UT) CAPS Take 1 capsule (5,000 Units total) by mouth daily. 90 capsule 1 10/14/2023   diclofenac  sodium (VOLTAREN ) 1 % GEL Apply 2 g topically 2 (two) times daily as needed (pain).    Unknown   docusate sodium  (COLACE) 100 MG capsule Take 1 capsule (100 mg total) by mouth 2 (two) times daily. 60 capsule 0 Unknown   dorzolamide -timolol  (COSOPT ) 2-0.5 % ophthalmic solution Place 1 drop into both eyes 2 (two) times daily.   10/14/2023   doxepin  (SINEQUAN ) 10 MG capsule TAKE 1 CAPSULE BY MOUTH AT BEDTIME 30 capsule 0 Past Week   DULoxetine  (CYMBALTA ) 60 MG capsule TAKE 1 CAPSULE BY MOUTH DAILY 90 capsule 2 10/14/2023   esomeprazole  (NEXIUM ) 40 MG capsule Take 1 capsule (40 mg total) by mouth daily. 90 capsule 3 10/14/2023   ferrous sulfate  325 (65 FE) MG tablet Take 325 mg by mouth daily with breakfast.   10/14/2023   fluticasone  (FLONASE ) 50 MCG/ACT nasal spray Place 2 sprays into both nostrils daily as needed for allergies.  16 g 11 Unknown   furosemide  (LASIX ) 20 MG tablet TAKE 1 TABLET BY MOUTH DAILY 90 tablet 1 10/14/2023   hydrOXYzine  (ATARAX ) 25 MG tablet Take 25 mg by mouth at bedtime as needed (insomnia).   Unknown   insulin  glargine (LANTUS  SOLOSTAR) 100 UNIT/ML Solostar Pen Inject 30 Units into the skin at bedtime. 15 mL 2 10/13/2023   insulin  lispro (HUMALOG ) 100 UNIT/ML KwikPen Inject 10 Units into the skin 3 (three) times daily. (Patient taking differently: Inject 3-15 Units into the skin 3 (three) times daily.) 15 mL 2 10/14/2023 Morning   lactulose  (CHRONULAC ) 10 GM/15ML solution Take 15 mLs (10 g total) by mouth 2 (two) times daily as needed for mild constipation, moderate constipation or severe constipation. 236 mL 0 Unknown   losartan  (COZAAR ) 50 MG tablet TAKE 1 TABLET BY MOUTH DAILY 90 tablet 1 10/14/2023   metFORMIN  (GLUCOPHAGE ) 500 MG tablet Take 1 tablet (500 mg total) by mouth 2 (two) times daily. 180 tablet 1 10/14/2023   potassium chloride  SA (K-DUR,KLOR-CON ) 20 MEQ tablet Take 20 mEq by mouth daily.   10/14/2023   pravastatin  (PRAVACHOL ) 40 MG tablet TAKE 1 TABLET BY MOUTH DAILY 90 tablet 3 Past Week   traMADol  (ULTRAM ) 50 MG tablet TAKE 1 TABLET BY MOUTH EVERY 8 HOURS AS NEEDED 90 tablet 1 10/14/2023   vitamin B-12 (CYANOCOBALAMIN ) 1000 MCG tablet Take 1,000 mcg by mouth daily.   10/14/2023   Accu-Chek Softclix Lancets lancets Use with device to check sugars twice daily 100 each 12    glucose blood (ACCU-CHEK GUIDE TEST) test strip CHECK BLOOD SUGAR TWICE DAILY 200 each 1    Insulin  Pen Needle (BD PEN NEEDLE NANO U/F) 32G X 4 MM MISC 1 each by Does not apply route in the morning, at noon, in the evening, and at bedtime. 200 each 11    isosorbide  mononitrate (IMDUR ) 30 MG 24 hr tablet Take 1 tablet (30 mg total) by mouth daily. (Patient not taking: Reported on 10/15/2023) 90 tablet 1 Not Taking   polyethylene glycol powder (GLYCOLAX /MIRALAX ) powder 2 cap fulls in a full glass of water, two times a day,  for 5 days. (Patient not taking: Reported on 10/15/2023) 255 g 0 Not Taking   prednisoLONE  acetate (PRED FORTE ) 1 % ophthalmic suspension Place 1 drop into the right eye 2 (two) times daily. (Patient not taking: Reported on 10/15/2023)  Not Taking   Scheduled:   chlorthalidone   25 mg Oral Daily   cholecalciferol   5,000 Units Oral Daily   ciprofloxacin   2 drop Right Eye Q4H while awake   cyanocobalamin   1,000 mcg Oral Daily   dorzolamide -timolol   1 drop Both Eyes BID   doxepin   10 mg Oral QHS   DULoxetine   60 mg Oral Daily   ferrous sulfate   325 mg Oral Q breakfast   furosemide   20 mg Oral Daily   insulin  aspart  0-6 Units Subcutaneous TID WC   losartan   50 mg Oral Daily   pantoprazole   40 mg Oral Daily   potassium chloride  SA  20 mEq Oral Daily   pravastatin   40 mg Oral Daily   triamcinolone  ointment   Topical BID   Infusions:   heparin  1,300 Units/hr (10/17/23 1802)    Assessment: 83 yo F to start heparin  drip for foot pain. On apixaban  PTA for hx DVT, aflutter. Planning angiogram 8/29.   Hx; DVT, aflutter on eliquis , t2dm, htn, hld, OA, asthma, gerd, low back pain, tobacco use (25 pack-yr hx), CAD  Last dose of apixaban  8/26 @ 0817 Baseline labs: Hgb 8.8  Plt 419  aPTT 39  HL 0.79   Goal of Therapy:  Heparin  level 0.3-0.7 units/ml aPTT 66-102 seconds Monitor platelets by anticoagulation protocol: Yes  8/28@0157 : aPTT 103, SUPRAtherapeutic; 1300 units/hr   Plan:  Decrease heparin  infusion to 1200 units/hr Check aPTT and heparin  level in 8 hours to monitor correlation, follow aPTTs for dosing currently Once aPTT and HL correlating, switch to checking HL Continue to monitor H&H and platelets  Wanda Gardner, PharmD Clinical Pharmacist 10/18/2023 2:56 AM

## 2023-10-18 NOTE — Inpatient Diabetes Management (Signed)
 Inpatient Diabetes Program Recommendations  AACE/ADA: New Consensus Statement on Inpatient Glycemic Control   Target Ranges:  Prepandial:   less than 140 mg/dL      Peak postprandial:   less than 180 mg/dL (1-2 hours)      Critically ill patients:  140 - 180 mg/dL    Latest Reference Range & Units 10/17/23 08:24 10/17/23 12:15 10/17/23 16:41 10/17/23 18:11 10/17/23 20:00 10/18/23 01:47 10/18/23 04:55 10/18/23 07:46  Glucose-Capillary 70 - 99 mg/dL 810 (H) 819 (H) 791 (H) 170 (H) 269 (H) 229 (H) 286 (H) 254 (H)   Review of Glycemic Control  Diabetes history: DM2 Outpatient Diabetes medications: Lantus  30 units at bedtime, Humalog  3-15 units TID with meals Current orders for Inpatient glycemic control: Novolog  0-6 units TID with meals   Inpatient Diabetes Program Recommendations:     Insulin : CBG 254 mg/dl this morning.  Please consider ordering insulin  glargine 5 units Q24H to start now.  Outpatient DM: Given patient had fall at home, noted to have CBG of 27 mg/dl with EMS (treated with D10), and CBG of 15 mg/dl at 80:58 on 1/74/74, would recommend to decrease outpatient Lantus  dose and Humalog  correction scale.  No basal insulin  given since arrival to hospital and CBG 189 this am.  May want to decrease Lantus  to 9 units Q24H (based on 92.3 kg x 0.1 unit) and Humalog  to 0-6 units TID with meals (based on same Novolog  correction scale that is currently ordered). Recommend patient follow up with PCP regarding DM management. Strongly advise patient to start using Dexcom G7 CGM that she has at home already.  At discharge, please provide Rx for Baqsimi (nasal glucagon, [id number]).   Thanks, Earnie Gainer, RN, MSN, CDCES Diabetes Coordinator Inpatient Diabetes Program [phone] (Team Pager from 8am to 5pm)

## 2023-10-18 NOTE — Consult Note (Signed)
 Pharmacy IDA Note  Wanda Gardner is a 83 y.o. female admitted on 10/15/2023 with cellulitis.  Patient has history of iron  deficiency anemia. Pharmacy has been consulted to review IV iron  sucrose order.  Plan: IV iron  sucrose 300mg  over 1.5h ordered x1, for IDA.  Height: 5' 7 (170.2 cm) Weight: 92.3 kg (203 lb 7.8 oz) IBW/kg (Calculated) : 61.6  Temp (24hrs), Avg:98.6 F (37 C), Min:98.2 F (36.8 C), Max:99.3 F (37.4 C)  Recent Labs  Lab 10/15/23 1535 10/16/23 0431 10/17/23 0418 10/18/23 0157  WBC 7.4 6.4 6.5 7.3  CREATININE 0.97 0.88 1.02* 1.07*    Estimated Creatinine Clearance: 48.1 mL/min (A) (by C-G formula based on SCr of 1.07 mg/dL (H)).   Iron  Panel -iron  11 -TIBC 375 -T Sat 3 -ferritin 4  CBC, 8/28 - rbc 3.71 - hgb 8.7 - mcv 74.1  No Known Allergies  Thank you for allowing pharmacy to be a part of this patient's care.  Leonor BROCKS Jaslen Adcox 10/18/2023 2:05 PM

## 2023-10-18 NOTE — Progress Notes (Signed)
 Progress Note   Patient: Wanda Gardner FMW:[id number] DOB: 1940-05-10 DOA: 10/15/2023     0 DOS: the patient was seen and examined on 10/18/2023   Brief hospital course: 83 y.o. female with medical history significant for GERD, osteoarthritis, asthma, type 2 diabetes mellitus, hypertension, dyslipidemia, OSA and glaucoma, presented to the emergency room with acute onset of syncope with associated hypoglycemia.  She denied any nausea or vomiting or diarrhea or abdominal pain.  No fever or chills.  She stated she has not eaten anything today.  She is not sure if she took her Lantus  last night.  No chest pain or palpitations.  No cough or wheezing or dyspnea.  No dysuria, oliguria or hematuria or flank pain.   ED Course: When she came to the ER, BP was 138/93 with temperature 97.5 otherwise normal vital signs.  Labs revealed blood glucose of 164 and calcium 8.5 with albumin 3.2 and otherwise unremarkable CMP.  High-sensitivity troponin I was 609 and total CK was 103.  CBC showed anemia slightly worse than previous levels with microcytosis and thrombocytosis. EKG as reviewed by me : EKG showed atrial fibrillation with controlled ventricular sponsor of 63 and PVCs with low voltage QRS and anteroseptal Q waves. Imaging: Portable chest x-ray showed low lung volumes with no acute cardiopulmonary disease. Head and neck CT revealed no acute intracranial normality and no acute displaced fracture or traumatic listhesis of the cervical spine.   The patient was given an amp of D50 followed by D10 W at 100 mL/h.  She will be admitted to an observation medical telemetry bed for further evaluation and management.  8/26.  Patient given a unit of blood for hemoglobin dropping down to 6.9.  Eliquis  held 8/27.  Patient complaining of left foot pain.  Vascular team to do angiogram on Friday.  Heparin  drip started.  Need to watch hemoglobin closely.  Assessment and Plan: * Left foot pain Barely able to palpate pulses  with Doppler.  Likely ischemic pain.  Vascular was planning on doing an outpatient angiogram on September 2 or 3 but with her pain they will do it on Friday.  Heparin  drip but will have to watch hemoglobin closely.  (Patient on Eliquis  as outpatient but was held on 8/26 with drop in hemoglobin, then she had severe pain in her left foot on 8/27).  Hypoglycemia Discontinued D10 drip on 8/28.  Patient on sliding scale insulin .  Patient only eats 1 meal a day and likely will not need as much insulin  upon going home.  Will start Semglee  insulin  5 units at night since patient will be n.p.o. for procedure tomorrow.  Likely need 9 units at night on discharge.  Acute blood loss anemia Patient given 1 unit of packed red blood cells on 8/26.  Today's hemoglobin 8.7.  Need to watch hemoglobin closely.  Ferritin 4.  Will speak with patient about IV iron  today.  Iron  deficiency anemia.  Dyslipidemia On pravastatin   CKD stage 3a, GFR 45-59 ml/min (HCC) Continue to monitor.  Will give fluid bolus today before angiogram tomorrow.  Hypokalemia Replaced  Conjunctivitis, right eye Ciloxan  eyedrops  Essential hypertension On Cozaar .  Hold Hygroton   Atrial fibrillation with controlled ventricular rate (HCC) Holding Eliquis .  Continue heparin  drip.  Tobacco abuse Smoking cessation recommended        Subjective: Patient states he is still having some foot pain but a little bit better than yesterday.  No bleeding in the GI tract or genitourinary system.  Patient  is feeling better.  Always has a little left lower quadrant abdominal pain.  Physical Exam: Vitals:   10/17/23 1642 10/17/23 1955 10/18/23 0458 10/18/23 0744  BP: (!) 128/50 116/77 (!) 107/47 (!) 126/44  Pulse: 74 89 67 75  Resp: 16 16 14 16   Temp: 99.3 F (37.4 C) 98.5 F (36.9 C) 98.2 F (36.8 C) 98.4 F (36.9 C)  TempSrc: Oral Oral Oral Oral  SpO2: 97% 99% 98% 96%  Weight:      Height:       Physical Exam HENT:     Head:  Normocephalic.     Mouth/Throat:     Pharynx: No oropharyngeal exudate.  Eyes:     General: Lids are normal.     Conjunctiva/sclera: Conjunctivae normal.  Cardiovascular:     Rate and Rhythm: Normal rate and regular rhythm.     Heart sounds: Normal heart sounds, S1 normal and S2 normal.  Pulmonary:     Breath sounds: No decreased breath sounds, wheezing, rhonchi or rales.  Abdominal:     Palpations: Abdomen is soft.     Tenderness: There is abdominal tenderness in the left lower quadrant.  Musculoskeletal:     Right lower leg: No swelling.     Left lower leg: No swelling.  Skin:    General: Skin is warm.     Findings: No rash.  Neurological:     Mental Status: She is alert and oriented to person, place, and time.     Data Reviewed: Creatinine 1.07 with a GFR 52, potassium 4.0, uric acid 5.4, hemoglobin 8.7, platelet count 402, white blood count 7.3   Disposition: Status is: Observation Patient on heparin  drip and for left lower extremity angiogram tomorrow.  Planned Discharge Destination: Home    Time spent: 28 minutes  Author: Charlie Patterson, MD 10/18/2023 1:31 PM  For on call review [email or web address].

## 2023-10-18 NOTE — TOC Initial Note (Addendum)
 Transition of Care [facility]) - Initial/Assessment Note    Patient Details  Name: Wanda Gardner MRN: [id number] Date of Birth: 1940/03/26  Transition of Care [facility]) CM/SW Contact:    Corean ONEIDA Haddock, RN Phone Number: 10/18/2023, 2:53 PM  Clinical Narrative:                    Admitted for: left foot pain.  Plan for angiogram tomorrow Admitted from: Home alone.  States that sometimes her daughter spends the night with her PCP: Orlean  Current home health/prior home health/DME: [facility].  Patient states the [facility] was given to her and is too small  Therapy recommending HH and RW.  Patient in agreement an also request [facility].  Therapy states that she does not have a preference of agencies Referral made to [place] with [facility] home health.  Will place referral for DME closer to dc       Patient Goals and CMS Choice            Expected Discharge Plan and Services                                              Prior Living Arrangements/Services                       Activities of Daily Living   ADL Screening (condition at time of admission) Independently performs ADLs?: Yes (appropriate for developmental age) Is the patient deaf or have difficulty hearing?: No Does the patient have difficulty seeing, even when wearing glasses/contacts?: Yes Does the patient have difficulty concentrating, remembering, or making decisions?: No  Permission Sought/Granted                  Emotional Assessment              Admission diagnosis:  Hypoglycemia [E16.2] Syncope, unspecified syncope type [R55] Acute foot pain, left [M79.672] Patient Active Problem List   Diagnosis Date Noted   Acute foot pain, left 10/18/2023   Atherosclerosis of native artery of left lower extremity with rest pain (HCC) 10/17/2023   Left foot pain 10/17/2023   Acute blood loss anemia 10/17/2023   Conjunctivitis, right eye 10/17/2023   Hypokalemia 10/17/2023   CKD stage 3a,  GFR 45-59 ml/min (HCC) 10/17/2023   Hypoglycemia 10/15/2023   Dyslipidemia 10/15/2023   Atrial fibrillation with controlled ventricular rate (HCC) 10/15/2023   Essential hypertension 10/15/2023   Essential hypertension, benign 12/26/2022   Mixed hyperlipidemia 12/26/2022   Vitamin D  deficiency, unspecified 12/26/2022   B12 deficiency due to diet 12/26/2022   Urinary incontinence without sensory awareness 12/26/2022   Overactive bladder 12/26/2022   Stroke-like symptom 06/10/2022   Pyuria 06/10/2022   Elevated lactic acid level 06/10/2022   Bicipital tenosynovitis 05/12/2022   Acquired trigger finger of right ring finger 01/10/2022   Carpal tunnel syndrome of right wrist 08/14/2019   Tobacco abuse 12/26/2018   Atrial flutter (HCC) 12/17/2018   Primary osteoarthritis of right hip 06/13/2016   Osteoarthritis of knee 11/23/2015   Cervical radiculopathy 05/26/2015   Impingement syndrome of shoulder region 01/08/2015   Female stress incontinence 08/19/2014   Acute subendocardial infarction, subsequent episode of care [facility]) 10/30/2013   Central retinal vein occlusion 03/31/2011   Tear film insufficiency 03/31/2011   Low back pain 11/03/2010   Glaucoma associated  with vascular disorder of eye 05/23/2010   Chronic glaucoma 03/22/2010   Senile nuclear sclerosis 03/22/2010   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 [place] [address] Phone: [phone] Fax: [phone]     Social Drivers of Health (SDOH) Social History: SDOH Screenings   Food Insecurity: No Food Insecurity (10/15/2023)  Housing: Low Risk  (10/15/2023)  Transportation  Needs: No Transportation Needs (10/15/2023)  Utilities: At Risk (10/15/2023)  Depression (PHQ2-9): High Risk (05/07/2023)  Social Connections: Moderately Isolated (10/15/2023)  Tobacco Use: High Risk (10/15/2023)   SDOH Interventions:     Readmission Risk Interventions     No data to display

## 2023-10-18 NOTE — Progress Notes (Signed)
 PHARMACY - ANTICOAGULATION CONSULT NOTE  Pharmacy Consult for heparin  drip Indication: foot pain   (hx DVT, afib on apixaban )  No Known Allergies  Patient Measurements: Height: 5' 7 (170.2 cm) Weight: 92.3 kg (203 lb 7.8 oz) IBW/kg (Calculated) : 61.6 HEPARIN  DW (KG): 81.6  Vital Signs: Temp: 97.6 F (36.4 C) (08/28 2018) BP: 132/60 (08/28 2018) Pulse Rate: 69 (08/28 2018)  Labs: Recent Labs    10/15/23 2209 10/16/23 0431 10/16/23 0431 10/16/23 1329 10/17/23 0418 10/17/23 1648 10/17/23 1648 10/18/23 0157 10/18/23 1201 10/18/23 2014  HGB  --  6.9*   < > 6.5* 8.8*  --   --  8.7*  --   --   HCT  --  22.4*   < > 21.2* 27.5*  --   --  27.5*  --   --   PLT  --  447*  --   --  419*  --   --  402*  --   --   APTT  --   --   --   --   --  39*   < > 103* 94* 175*  HEPARINUNFRC  --   --   --   --   --  0.79*  --   --  0.83*  --   CREATININE  --  0.88  --   --  1.02*  --   --  1.07*  --   --   TROPONINIHS 9  --   --   --   --   --   --   --   --   --    < > = values in this interval not displayed.    Estimated Creatinine Clearance: 48.1 mL/min (A) (by C-G formula based on SCr of 1.07 mg/dL (H)).   Medical History: Past Medical History:  Diagnosis Date   Acid reflux    AKI (acute kidney injury) (HCC) 06/10/2022   Arthritis    Asthma    Chest pain, unspecified 10/30/2013   Formatting of this note might be different from the original. Note: Unchanged Note: Unchanged Formatting of this note might be different from the original. Note: Unchanged Note: Unchanged   Constipation 04/20/2011   Formatting of this note might be different from the original. Note: Unchanged - probably CIC. Note: Unchanged - probably CIC.   COPD (chronic obstructive pulmonary disease) (HCC)    Cough 10/30/2013   Formatting of this note might be different from the original. Note: Unchanged Note: Unchanged   Depression    Diabetes mellitus (HCC) 01/10/2022   Diabetes mellitus without complication  (HCC)    Dyspnea    Glaucoma    Headache    right side of head   Headache 07/25/2011   Hyperlipidemia    Hypertension    Knee stiff 05/12/2022   Lens replaced 03/22/2010   Neuropathic pain of hands and feet    PONV (postoperative nausea and vomiting) 1982   back with my hysterectomy.   Pseudophakia 03/22/2010   Sleep apnea    Doesn't use CPAP   Stiffness of shoulder joint 05/12/2022   Trigger finger of right hand 01/10/2022   Wears dentures    full upper and lower    Medications:  Medications Prior to Admission  Medication Sig Dispense Refill Last Dose/Taking   albuterol  (VENTOLIN  HFA) 108 (90 Base) MCG/ACT inhaler Inhale 2 puffs into the lungs every 6 (six) hours as needed for wheezing or shortness of breath. 8 g 2  Unknown   apixaban  (ELIQUIS ) 5 MG TABS tablet Take 1 tablet (5 mg total) by mouth 2 (two) times daily. 60 tablet 3 10/14/2023 Morning   betamethasone , augmented, (DIPROLENE ) 0.05 % lotion Apply 1 Application topically 2 (two) times daily.   Unknown   chlorthalidone  (HYGROTON ) 25 MG tablet TAKE 1 TABLET BY MOUTH DAILY 90 tablet 2 10/14/2023   Cholecalciferol  (VITAMIN D3) 125 MCG (5000 UT) CAPS Take 1 capsule (5,000 Units total) by mouth daily. 90 capsule 1 10/14/2023   diclofenac  sodium (VOLTAREN ) 1 % GEL Apply 2 g topically 2 (two) times daily as needed (pain).    Unknown   docusate sodium  (COLACE) 100 MG capsule Take 1 capsule (100 mg total) by mouth 2 (two) times daily. 60 capsule 0 Unknown   dorzolamide -timolol  (COSOPT ) 2-0.5 % ophthalmic solution Place 1 drop into both eyes 2 (two) times daily.   10/14/2023   doxepin  (SINEQUAN ) 10 MG capsule TAKE 1 CAPSULE BY MOUTH AT BEDTIME 30 capsule 0 Past Week   DULoxetine  (CYMBALTA ) 60 MG capsule TAKE 1 CAPSULE BY MOUTH DAILY 90 capsule 2 10/14/2023   esomeprazole  (NEXIUM ) 40 MG capsule Take 1 capsule (40 mg total) by mouth daily. 90 capsule 3 10/14/2023   ferrous sulfate  325 (65 FE) MG tablet Take 325 mg by mouth daily with  breakfast.   10/14/2023   fluticasone  (FLONASE ) 50 MCG/ACT nasal spray Place 2 sprays into both nostrils daily as needed for allergies. 16 g 11 Unknown   furosemide  (LASIX ) 20 MG tablet TAKE 1 TABLET BY MOUTH DAILY 90 tablet 1 10/14/2023   hydrOXYzine  (ATARAX ) 25 MG tablet Take 25 mg by mouth at bedtime as needed (insomnia).   Unknown   insulin  glargine (LANTUS  SOLOSTAR) 100 UNIT/ML Solostar Pen Inject 30 Units into the skin at bedtime. 15 mL 2 10/13/2023   insulin  lispro (HUMALOG ) 100 UNIT/ML KwikPen Inject 10 Units into the skin 3 (three) times daily. (Patient taking differently: Inject 3-15 Units into the skin 3 (three) times daily.) 15 mL 2 10/14/2023 Morning   lactulose  (CHRONULAC ) 10 GM/15ML solution Take 15 mLs (10 g total) by mouth 2 (two) times daily as needed for mild constipation, moderate constipation or severe constipation. 236 mL 0 Unknown   losartan  (COZAAR ) 50 MG tablet TAKE 1 TABLET BY MOUTH DAILY 90 tablet 1 10/14/2023   metFORMIN  (GLUCOPHAGE ) 500 MG tablet Take 1 tablet (500 mg total) by mouth 2 (two) times daily. 180 tablet 1 10/14/2023   potassium chloride  SA (K-DUR,KLOR-CON ) 20 MEQ tablet Take 20 mEq by mouth daily.   10/14/2023   pravastatin  (PRAVACHOL ) 40 MG tablet TAKE 1 TABLET BY MOUTH DAILY 90 tablet 3 Past Week   traMADol  (ULTRAM ) 50 MG tablet TAKE 1 TABLET BY MOUTH EVERY 8 HOURS AS NEEDED 90 tablet 1 10/14/2023   vitamin B-12 (CYANOCOBALAMIN ) 1000 MCG tablet Take 1,000 mcg by mouth daily.   10/14/2023   Accu-Chek Softclix Lancets lancets Use with device to check sugars twice daily 100 each 12    glucose blood (ACCU-CHEK GUIDE TEST) test strip CHECK BLOOD SUGAR TWICE DAILY 200 each 1    Insulin  Pen Needle (BD PEN NEEDLE NANO U/F) 32G X 4 MM MISC 1 each by Does not apply route in the morning, at noon, in the evening, and at bedtime. 200 each 11    isosorbide  mononitrate (IMDUR ) 30 MG 24 hr tablet Take 1 tablet (30 mg total) by mouth daily. (Patient not taking: Reported on 10/15/2023)  90 tablet 1 Not Taking  polyethylene glycol powder (GLYCOLAX /MIRALAX ) powder 2 cap fulls in a full glass of water, two times a day, for 5 days. (Patient not taking: Reported on 10/15/2023) 255 g 0 Not Taking   prednisoLONE  acetate (PRED FORTE ) 1 % ophthalmic suspension Place 1 drop into the right eye 2 (two) times daily. (Patient not taking: Reported on 10/15/2023)   Not Taking   Scheduled:   cholecalciferol   5,000 Units Oral Daily   ciprofloxacin   2 drop Right Eye Q4H while awake   cyanocobalamin   1,000 mcg Oral Daily   dorzolamide -timolol   1 drop Both Eyes BID   doxepin   10 mg Oral QHS   DULoxetine   60 mg Oral Daily   insulin  aspart  0-6 Units Subcutaneous TID WC   insulin  glargine  6 Units Subcutaneous QHS   [START ON 10/19/2023] losartan   25 mg Oral Daily   nitrofurantoin  (macrocrystal-monohydrate)  100 mg Oral Q12H   pantoprazole   40 mg Oral Daily   potassium chloride  SA  20 mEq Oral Daily   pravastatin   40 mg Oral Daily   triamcinolone  ointment   Topical BID   Infusions:   sodium chloride       ceFAZolin  (ANCEF ) IV     heparin  1,200 Units/hr (10/18/23 1509)    Assessment: 83 yo F to start heparin  drip for foot pain. PMH of DVT (but unsure when this was) and aflutter on apixaban  PTA. Last dose apixaban  8/26 @0817 . CHADSVASc of 6. Plan for angiogram on 9/29. Will use aPTT to monitor heparin  due to false elevation of heparin  level. Hgb and plt remain stable. Pt did have a low hgb of 6.5 on 8/26 and pt received blood transfusion.    Goal of Therapy:  Heparin  level 0.3-0.7 units/ml once aPTT and heparin  level correlate.  aPTT 66-102 seconds Monitor platelets by anticoagulation protocol: Yes  8/28@0157 : aPTT 103, SUPRAtherapeutic; 1300 units/hr 8/28@1201 : aPTT  94,  therapeutic x1                        HL 0.83,  no correlation 8/28@2014 : aPTT 175.   Plan:  aPTT is supratherapetic. Will hold heparin  for 1 hour and decrease heparin  infusion to 1000 units/hr. Recheck aPTT in 8  hours. Heparin  level and CBC daily while on heparin . Switch to heparin  level once heparin  level and aPTT correlate.   Cathaleen GORMAN Blanch, PharmD Clinical Pharmacist 10/18/2023 9:36 PM

## 2023-10-18 NOTE — Progress Notes (Signed)
 Physical Therapy Treatment Patient Details Name: CITLALIC NORLANDER MRN: [id number] DOB: [birth year] Today's Date: 10/18/2023   History of Present Illness Pt is an 83 y.o. female with medical history significant for GERD, osteoarthritis, asthma, type 2 diabetes mellitus, hypertension, dyslipidemia, OSA and glaucoma, presented to the emergency room with acute onset of syncope with associated hypoglycemia.  Ik:Ybenhobrzfpj, Essential hypertension, Atrial fibrillation with controlled ventricular rate, Dyslipidemia.    PT Comments  Pt received supine in bed agreeable to PT services. Pt able to exit bed at supervision. Stands to RW at [facility]. Reports need to urinate. Able to ambulate CGA to bathroom using RW. Indep for pericare and mod-I for STS using railing in bathroom. Chair follow provided due to heparin  IV for safety but able to complete 180' with RW initially CGA to supervision with intermittent CGA with occasional small stumbles towards end of bout due to fatigue. Some VC's needed for upright posture and RW placement but excellent progress in mobility compared to eval. Pt placed in recliner with all needs. D/c recs remain appropriate.    If plan is discharge home, recommend the following: A little help with walking and/or transfers;A little help with bathing/dressing/bathroom;Assistance with cooking/housework;Assist for transportation;Direct supervision/assist for medications management;Help with stairs or ramp for entrance   Can travel by private vehicle        Equipment Recommendations  Rolling walker (2 wheels)    Recommendations for Other Services       Precautions / Restrictions Precautions Precautions: Fall Restrictions Weight Bearing Restrictions Per Provider Order: No     Mobility  Bed Mobility Overal bed mobility: Needs Assistance Bed Mobility: Supine to Sit     Supine to sit: Supervision, HOB elevated, Used rails       Patient Response: Cooperative  Transfers Overall  transfer level: Needs assistance Equipment used: Rolling walker (2 wheels) Transfers: Sit to/from Stand Sit to Stand: Contact guard assist           General transfer comment: VC's for hand placement    Ambulation/Gait Ambulation/Gait assistance: Contact guard assist, Supervision Gait Distance (Feet): 180 Feet Assistive device: Rolling walker (2 wheels) Gait Pattern/deviations: Step-through pattern, Decreased step length - right, Decreased step length - left, Trunk flexed       General Gait Details: chair follow for safety. Safe completion   Stairs             Wheelchair Mobility     Tilt Bed Tilt Bed Patient Response: Cooperative  Modified Rankin (Stroke Patients Only)       Balance Overall balance assessment: Needs assistance Sitting-balance support: Single extremity supported Sitting balance-Leahy Scale: Good     Standing balance support: Bilateral upper extremity supported, During functional activity, Reliant on assistive device for balance Standing balance-Leahy Scale: Fair                              Hotel manager: No apparent difficulties  Cognition Arousal: Alert Behavior During Therapy: WFL for tasks assessed/performed   PT - Cognitive impairments: No apparent impairments                         Following commands: Intact      Cueing Cueing Techniques: Verbal cues  Exercises      General Comments General comments (skin integrity, edema, etc.): HR upwards to low 100's post gait      Pertinent Vitals/Pain Pain Assessment Pain  Assessment: No/denies pain    Home Living                          Prior Function            PT Goals (current goals can now be found in the care plan section) Acute Rehab PT Goals Patient Stated Goal: to go home PT Goal Formulation: With patient Time For Goal Achievement: 10/30/23 Potential to Achieve Goals: Good Progress towards PT goals:  Progressing toward goals    Frequency    Min 2X/week      PT Plan      Co-evaluation              AM-PAC PT 6 Clicks Mobility   Outcome Measure  Help needed turning from your back to your side while in a flat bed without using bedrails?: A Little Help needed moving from lying on your back to sitting on the side of a flat bed without using bedrails?: A Little Help needed moving to and from a bed to a chair (including a wheelchair)?: A Little Help needed standing up from a chair using your arms (e.g., wheelchair or bedside chair)?: A Little Help needed to walk in hospital room?: A Little Help needed climbing 3-5 steps with a railing? : A Little 6 Click Score: 18    End of Session Equipment Utilized During Treatment: Gait belt Activity Tolerance: Patient tolerated treatment well Patient left: in chair;with call bell/phone within reach;with chair alarm set Nurse Communication: Mobility status PT Visit Diagnosis: Other abnormalities of gait and mobility (R26.89);Muscle weakness (generalized) (M62.81);Difficulty in walking, not elsewhere classified (R26.2)     Time: 8882-8855 PT Time Calculation (min) (ACUTE ONLY): 27 min  Charges:    $Therapeutic Activity: 23-37 mins PT General Charges $$ ACUTE PT VISIT: 1 Visit                     Dorina HERO. Fairly IV, PT, DPT Physical Therapist- [place]  [facility] 10/18/2023, 12:49 PM

## 2023-10-19 ENCOUNTER — Encounter: Payer: Self-pay | Admitting: Anesthesiology

## 2023-10-19 ENCOUNTER — Ambulatory Visit: Admission: RE | Admit: 2023-10-19 | Source: Home / Self Care | Admitting: Vascular Surgery

## 2023-10-19 ENCOUNTER — Encounter
Admission: EM | Disposition: A | Discharge status: Home Health | Payer: Self-pay | Source: Home / Self Care | Attending: Internal Medicine

## 2023-10-19 ENCOUNTER — Encounter: Payer: Self-pay | Admitting: Internal Medicine

## 2023-10-19 DIAGNOSIS — E785 Hyperlipidemia, unspecified: Secondary | ICD-10-CM | POA: Diagnosis not present

## 2023-10-19 DIAGNOSIS — I70229 Atherosclerosis of native arteries of extremities with rest pain, unspecified extremity: Secondary | ICD-10-CM

## 2023-10-19 DIAGNOSIS — M79672 Pain in left foot: Secondary | ICD-10-CM | POA: Diagnosis not present

## 2023-10-19 DIAGNOSIS — H109 Unspecified conjunctivitis: Secondary | ICD-10-CM

## 2023-10-19 DIAGNOSIS — I70201 Unspecified atherosclerosis of native arteries of extremities, right leg: Secondary | ICD-10-CM

## 2023-10-19 DIAGNOSIS — E162 Hypoglycemia, unspecified: Secondary | ICD-10-CM | POA: Diagnosis not present

## 2023-10-19 DIAGNOSIS — I998 Other disorder of circulatory system: Secondary | ICD-10-CM

## 2023-10-19 DIAGNOSIS — D62 Acute posthemorrhagic anemia: Secondary | ICD-10-CM | POA: Diagnosis not present

## 2023-10-19 DIAGNOSIS — I700 Atherosclerosis of aorta: Secondary | ICD-10-CM

## 2023-10-19 HISTORY — PX: LOWER EXTREMITY INTERVENTION: CATH118252

## 2023-10-19 HISTORY — PX: LOWER EXTREMITY ANGIOGRAPHY: CATH118251

## 2023-10-19 LAB — BASIC METABOLIC PANEL WITH GFR
Anion gap: 9 (ref 5–15)
BUN: 12 mg/dL (ref 8–23)
CO2: 29 mmol/L (ref 22–32)
Calcium: 9 mg/dL (ref 8.9–10.3)
Chloride: 99 mmol/L (ref 98–111)
Creatinine, Ser: 0.85 mg/dL (ref 0.44–1.00)
GFR, Estimated: >60 mL/min (ref >60)
Glucose, Bld: 141 mg/dL — ABNORMAL HIGH (ref 70–99)
Potassium: 4.4 mmol/L (ref 3.5–5.1)
Sodium: 137 mmol/L (ref 135–145)

## 2023-10-19 LAB — GLUCOSE, CAPILLARY
Glucose-Capillary: 116 mg/dL — ABNORMAL HIGH (ref 70–99)
Glucose-Capillary: 116 mg/dL — ABNORMAL HIGH (ref 70–99)
Glucose-Capillary: 117 mg/dL — ABNORMAL HIGH (ref 70–99)
Glucose-Capillary: 117 mg/dL — ABNORMAL HIGH (ref 70–99)
Glucose-Capillary: 128 mg/dL — ABNORMAL HIGH (ref 70–99)
Glucose-Capillary: 160 mg/dL — ABNORMAL HIGH (ref 70–99)
Glucose-Capillary: 206 mg/dL — ABNORMAL HIGH (ref 70–99)
Glucose-Capillary: 275 mg/dL — ABNORMAL HIGH (ref 70–99)

## 2023-10-19 LAB — HEPARIN LEVEL (UNFRACTIONATED): Heparin Unfractionated: 0.56 [IU]/mL (ref 0.30–0.70)

## 2023-10-19 LAB — CBC
HCT: 32.2 % — ABNORMAL LOW (ref 36.0–46.0)
Hemoglobin: 10.1 g/dL — ABNORMAL LOW (ref 12.0–15.0)
MCH: 23.9 pg — ABNORMAL LOW (ref 26.0–34.0)
MCHC: 31.4 g/dL (ref 30.0–36.0)
MCV: 76.1 fL — ABNORMAL LOW (ref 80.0–100.0)
Platelets: 395 K/uL (ref 150–400)
RBC: 4.23 MIL/uL (ref 3.87–5.11)
RDW: 21 % — ABNORMAL HIGH (ref 11.5–15.5)
WBC: 5 K/uL (ref 4.0–10.5)
nRBC: 0 % (ref 0.0–0.2)

## 2023-10-19 LAB — APTT: aPTT: 55 s — ABNORMAL HIGH (ref 24–36)

## 2023-10-19 SURGERY — LOWER EXTREMITY INTERVENTION
Anesthesia: Moderate Sedation | Site: Leg Lower | Laterality: Left

## 2023-10-19 MED ORDER — SODIUM CHLORIDE 0.9 % IV SOLN: 250.0000 mL | INTRAVENOUS | Status: DC | PRN

## 2023-10-19 MED ORDER — FENTANYL CITRATE (PF) 100 MCG/2ML IJ SOLN
INTRAMUSCULAR | Status: DC | PRN
  Administered 2023-10-19: 25 ug via INTRAVENOUS
  Administered 2023-10-19: 50 ug via INTRAVENOUS
  Administered 2023-10-19: 25 ug via INTRAVENOUS

## 2023-10-19 MED ORDER — LIDOCAINE HCL (PF) 1 % IJ SOLN
INTRAMUSCULAR | Status: DC | PRN
  Administered 2023-10-19 (×2): 10 mL via INTRADERMAL

## 2023-10-19 MED ORDER — IODIXANOL 320 MG/ML IV SOLN
INTRAVENOUS | Status: DC | PRN
  Administered 2023-10-19: 65 mL via INTRA_ARTERIAL

## 2023-10-19 MED ORDER — CLOPIDOGREL BISULFATE 75 MG PO TABS: 75.0000 mg | ORAL_TABLET | Freq: Every day | ORAL | Status: DC

## 2023-10-19 MED ORDER — CLOPIDOGREL BISULFATE 300 MG PO TABS: 300.0000 mg | ORAL_TABLET | ORAL | Status: DC

## 2023-10-19 MED ORDER — CHLORHEXIDINE GLUCONATE CLOTH 2 % EX PADS
6.0000 | MEDICATED_PAD | Freq: Every day | CUTANEOUS | Status: DC
  Administered 2023-10-19 – 2023-10-20 (×2): 6 via TOPICAL

## 2023-10-19 MED ORDER — SODIUM CHLORIDE 0.9 % IV SOLN: INTRAVENOUS | Status: DC

## 2023-10-19 MED ORDER — HEPARIN (PORCINE) IN NACL 1000-0.9 UT/500ML-% IV SOLN
INTRAVENOUS | Status: DC | PRN
  Administered 2023-10-19: 1000 mL

## 2023-10-19 MED ORDER — HEPARIN SODIUM (PORCINE) 1000 UNIT/ML IJ SOLN
INTRAMUSCULAR | Status: AC
  Filled 2023-10-19: qty 10

## 2023-10-19 MED ORDER — FENTANYL CITRATE (PF) 100 MCG/2ML IJ SOLN
INTRAMUSCULAR | Status: AC
  Filled 2023-10-19: qty 2

## 2023-10-19 MED ORDER — HEPARIN SODIUM (PORCINE) 1000 UNIT/ML IJ SOLN
INTRAMUSCULAR | Status: DC | PRN
  Administered 2023-10-19: 6000 [IU] via INTRAVENOUS

## 2023-10-19 MED ORDER — APIXABAN 5 MG PO TABS
5.0000 mg | ORAL_TABLET | Freq: Two times a day (BID) | ORAL | Status: DC
  Administered 2023-10-19 – 2023-10-20 (×2): 5 mg via ORAL
  Filled 2023-10-19 (×2): qty 1

## 2023-10-19 MED ORDER — SODIUM CHLORIDE 0.9% FLUSH: 3.0000 mL | INTRAVENOUS | Status: DC | PRN

## 2023-10-19 MED ORDER — MIDAZOLAM HCL 2 MG/2ML IJ SOLN
INTRAMUSCULAR | Status: AC
  Filled 2023-10-19: qty 2

## 2023-10-19 MED ORDER — SODIUM CHLORIDE 0.9 % IV SOLN: INTRAVENOUS | Status: AC

## 2023-10-19 MED ORDER — ASPIRIN 325 MG PO TABS
325.0000 mg | ORAL_TABLET | ORAL | Status: AC
  Administered 2023-10-19: 325 mg via ORAL
  Filled 2023-10-19 (×2): qty 1

## 2023-10-19 MED ORDER — ASPIRIN 81 MG PO TBEC
81.0000 mg | DELAYED_RELEASE_TABLET | Freq: Every day | ORAL | Status: DC
  Administered 2023-10-20: 81 mg via ORAL
  Filled 2023-10-19: qty 1

## 2023-10-19 MED ORDER — HEPARIN BOLUS VIA INFUSION
2000.0000 [IU] | Freq: Once | INTRAVENOUS | Status: AC
  Administered 2023-10-19: 2000 [IU] via INTRAVENOUS
  Filled 2023-10-19: qty 2000

## 2023-10-19 MED ORDER — SODIUM CHLORIDE 0.9% FLUSH
3.0000 mL | Freq: Two times a day (BID) | INTRAVENOUS | Status: DC
  Administered 2023-10-19 – 2023-10-20 (×2): 3 mL via INTRAVENOUS

## 2023-10-19 MED ORDER — MIDAZOLAM HCL 2 MG/2ML IJ SOLN
INTRAMUSCULAR | Status: DC | PRN
  Administered 2023-10-19 (×2): 1 mg via INTRAVENOUS

## 2023-10-19 MED ORDER — METHYLPREDNISOLONE SODIUM SUCC 125 MG IJ SOLR: 125.0000 mg | Freq: Once | INTRAMUSCULAR | Status: DC | PRN

## 2023-10-19 SURGICAL SUPPLY — 24 items
BALLOON DORADO 10X40X80 (BALLOONS) IMPLANT
BALLOON LUTONIX 7X80X130 (BALLOONS) IMPLANT
BALLOON LUTONIX AV 8X40X75 (BALLOONS) IMPLANT
CATH ANGIO 5F PIGTAIL 65CM (CATHETERS) IMPLANT
COVER PROBE ULTRASOUND 5X96 (MISCELLANEOUS) IMPLANT
DEVICE PRESTO INFLATION (MISCELLANEOUS) IMPLANT
DEVICE STARCLOSE SE CLOSURE (Vascular Products) IMPLANT
DRAPE TABLE BACK 80X90 (DRAPES) IMPLANT
GLIDEWIRE ADV .035X180CM (WIRE) IMPLANT
GLIDEWIRE ADV .035X260CM (WIRE) IMPLANT
GOWN STRL REUS W/ TWL LRG LVL3 (GOWN DISPOSABLE) ×1 IMPLANT
INTRODUCER 7FR 23CM (INTRODUCER) IMPLANT
KIT MICROPUNCTURE VSI 5F STIFF (SHEATH) IMPLANT
NDL ENTRY 21GA 7CM ECHOTIP (NEEDLE) IMPLANT
NEEDLE ENTRY 21GA 7CM ECHOTIP (NEEDLE) ×1 IMPLANT
PACK ANGIOGRAPHY (CUSTOM PROCEDURE TRAY) ×1 IMPLANT
SET INTRO CAPELLA COAXIAL (SET/KITS/TRAYS/PACK) IMPLANT
SHEATH BRITE TIP 6FRX11 (SHEATH) IMPLANT
SHEATH BRITE TIP 7FRX11 (SHEATH) IMPLANT
STENT LIFESTAR 9X80 (Permanent Stent) IMPLANT
STENT LIFESTREAM 8X26X80 (Permanent Stent) IMPLANT
SYR MEDRAD MARK 7 150ML (SYRINGE) IMPLANT
TUBING CONTRAST HIGH PRESS 72 (TUBING) IMPLANT
WIRE J 3MM .035X145CM (WIRE) IMPLANT

## 2023-10-19 NOTE — Progress Notes (Signed)
 BP elevated, notified provider.

## 2023-10-19 NOTE — Op Note (Signed)
 [facility]  Percutaneous Study/Intervention Procedural Note   Date of Surgery: 10/19/2023  Surgeon:Cashae Weich, Cordella MATSU   Pre-operative Diagnosis: Atherosclerotic occlusive disease bilateral lower extremities with rest and ischemia of the left lower extremity  Post-operative diagnosis:  Same  Procedure(s) Performed:  1.  Abdominal aortogram  2.  Bilateral distal runoff  3.  Percutaneous transluminal angioplasty and stent placement right common iliac artery; kissing balloon technique  4.  Percutaneous transluminal and plasty and stent placement left common iliac artery; kissing balloon technique  5.  Ultrasound guided access bilateral common femoral arteries  6.  StarClose closure device bilateral common femoral arteries  Anesthesia: Conscious sedation was administered under my direct supervision by the interventional radiology RN. IV Versed  plus fentanyl  were utilized. Continuous ECG, pulse oximetry and blood pressure was monitored throughout the entire procedure. Conscious sedation was for a total of 58 minutes.  Sheath: Bilateral 7 French Pinnacle sheaths retrograde common femoral arteries extremity, 23 cm on the left and 11 cm on the right  Contrast: 65 cc  Fluoroscopy Time: 4.9 minutes  Indications: Patient presents with increasing pain of both lower extremities.  Physical examination as well as noninvasive studies demonstrate significant atherosclerotic occlusive disease.  Angiography with hope for intervention has been recommended.  Risks and benefits have been reviewed all questions have been answered alternative therapies have also been discussed patient has agreed to proceed.  Procedure:  Wanda Clay Murdockis a 83 y.o. female who was identified and appropriate procedural time out was performed.  The patient was then placed supine on the table and prepped and draped in the usual sterile fashion.  Ultrasound was used to evaluate the right common femoral  artery.  It was echolucent and pulsatile indicating it is patent .  An ultrasound image was acquired for the permanent record.  A micropuncture needle was used to access the right common femoral artery under direct ultrasound guidance.  The microwire was then advanced under fluoroscopic guidance without difficulty followed by the micro-sheath  A 0.035 J wire was advanced without resistance and a 5Fr sheath was placed.    The pigtail catheter was then positioned at the level of T12 and an AP image of the aorta was obtained. After review the images the pigtail catheter was repositioned above the aortic bifurcation and bilateral oblique views of the pelvis were obtained. Subsequently the detector was returned to the AP position and bilateral lower extremity runoff was obtained.  After review the images the ultrasound was reprepped and delivered back onto the sterile field. The left common femoral was then imaged with the ultrasound it was noted to be echolucent and pulsatile indicating patency. Images recorded for the permanent record. Under real-time visualization a microneedle was inserted into the anterior wall the common femoral artery microwire was then advanced without difficulty under fluoroscopic guidance followed by placement of the micro-sheath.  A advantage wire was then negotiated under fluoroscopic guidance into the aorta.  7 French sheath was then placed.  6000 units of heparin  was given and allowed to circulate for proximally 4 minutes.  The right sheath was then upsized to a 7 [place] sheath as well after a advantage wire was advanced through the pigtail catheter. Magnified images of the aortic bifurcation were then made using hand injection contrast from the femoral sheaths. After appropriate sizing a 8 mm x 26 mm Lifestream stent was selected for the right and a 8 mm x 26 mm Lifestream stent was selected for the left.  There were then advanced and positioned just above the aortic bifurcation.  Insufflation for full expansion of the stents was performed simultaneously. Follow-up imaging was then performed and the right stent was undersized distally where there was moderate poststenotic dilatation.  A 10 mm x 40 mm Dorado balloon was then advanced up the right side which Original balloon on the left and simultaneous inflation was repeated to 10 atm bilaterally.  Follow-up imaging demonstrated complete resolution of the common iliac artery stenosis at the origins with less than 10% residual stenosis.  Attention was then turned to the external iliac artery subtotal occlusion on the left.  A 9 mm x 80 mm life star stent was then deployed overlapping the Lifestream stent in the common iliac and extending two thirds of the way into the external iliac.  It was then postdilated with a 7 mm x 80 mm Lutonix drug-eluting balloon inflated to 10 atm for approximately 1 minute.  Follow-up imaging demonstrated less than 10% residual stenosis in the external iliac but the distal common iliac remains somewhat undersized and an 8 mm x 40 mm Lutonix drug-eluting balloon was advanced into the common iliac proximal portion of the life star stent dilated to 10 atm for approximately 1 minute.  Follow-up imaging now demonstrated less than 10% stenosis throughout.  Bilateral common and external iliac arteries.  Oblique views were then obtained of the groins in succession and Star close device is deployed without difficulty. There were no immediate complications   Findings:   Aortogram:  The abdominal aorta is opacified with a bolus injection contrast.  The abdominal aorta demonstrates diffuse disease but there are no hemodynamically significant lesions noted until the distal aortic bifurcation where bilateral greater than 70% ostial iliac lesions are identified.  There is moderate poststenotic dilatation in the right common iliac and its distal one half.  On the left there is diffuse disease of greater than 70% in the  distal common iliac artery and a greater than 95% stenosis in the proximal external iliac artery.    Right Lower Extremity: The right common femoral demonstrates moderate disease without hemodynamically significant stenosis.  The visualized portions of the profunda femoris and SFA are patent.  Left Lower Extremity: The common femoral on the left is diffusely diseased but free of hemodynamically significant stenosis as is the profunda femoris.  The superficial femoral artery is patent proximally but occludes at Hunter's canal and remains occluded to the mid popliteal artery.  Mid popliteal artery is reconstituted and appears to fill the trifurcation.  Tibioperoneal trunk and posterior tibial as well as the peroneal appear patent in the proximal one half.  Anterior tibial appears occluded.  Following placement of the iliac stents there is now wide patency with less than 10% residual stenosis with rapid flow through the aortic bifurcation bilaterally.  Following angioplasty and stent placement in the left external iliac artery there is less than 10% residual stenosis  Summary:  Successful reconstruction of the distal aorta and bilateral iliac arteries including the left external iliac  Disposition: Patient was taken to the recovery room in stable condition having tolerated the procedure well.  Cordella Nona Gracey 10/19/2023,2:26 PM

## 2023-10-19 NOTE — Plan of Care (Signed)

## 2023-10-19 NOTE — Progress Notes (Addendum)
 PHARMACY - ANTICOAGULATION CONSULT NOTE  Pharmacy Consult for heparin  drip Indication: foot pain   (hx DVT, afib on apixaban )  No Known Allergies  Patient Measurements: Height: 5' 7 (170.2 cm) Weight: 92.3 kg (203 lb 7.8 oz) IBW/kg (Calculated) : 61.6 HEPARIN  DW (KG): 81.6  Vital Signs: Temp: 98 F (36.7 C) (08/29 0829) Temp Source: Oral (08/29 0829) BP: 137/55 (08/29 0829) Pulse Rate: 67 (08/29 0829)  Labs: Recent Labs    10/17/23 0418 10/17/23 0418 10/17/23 1648 10/18/23 0157 10/18/23 1201 10/18/23 2014 10/19/23 0553 10/19/23 0916  HGB 8.8*  --   --  8.7*  --   --   --  10.1*  HCT 27.5*  --   --  27.5*  --   --   --  32.2*  PLT 419*  --   --  402*  --   --   --  395  APTT  --    < > 39* 103* 94* 175*  --  55*  HEPARINUNFRC  --   --  0.79*  --  0.83*  --  0.56  --   CREATININE 1.02*  --   --  1.07*  --   --   --  0.85   < > = values in this interval not displayed.    Estimated Creatinine Clearance: 60.6 mL/min (by C-G formula based on SCr of 0.85 mg/dL).   Medical History: Past Medical History:  Diagnosis Date   Acid reflux    AKI (acute kidney injury) (HCC) 06/10/2022   Arthritis    Asthma    Chest pain, unspecified 10/30/2013   Formatting of this note might be different from the original. Note: Unchanged Note: Unchanged Formatting of this note might be different from the original. Note: Unchanged Note: Unchanged   Constipation 04/20/2011   Formatting of this note might be different from the original. Note: Unchanged - probably [facility]. Note: Unchanged - probably [facility].   COPD (chronic obstructive pulmonary disease) (HCC)    Cough 10/30/2013   Formatting of this note might be different from the original. Note: Unchanged Note: Unchanged   Depression    Diabetes mellitus (HCC) 01/10/2022   Diabetes mellitus without complication (HCC)    Dyspnea    Glaucoma    Headache    right side of head   Headache 07/25/2011   Hyperlipidemia    Hypertension    Knee  stiff 05/12/2022   Lens replaced 03/22/2010   Neuropathic pain of hands and feet    PONV (postoperative nausea and vomiting) 1982   back with my hysterectomy.   Pseudophakia 03/22/2010   Sleep apnea    Doesn't use CPAP   Stiffness of shoulder joint 05/12/2022   Trigger finger of right hand 01/10/2022   Wears dentures    full upper and lower    Medications:  Medications Prior to Admission  Medication Sig Dispense Refill Last Dose/Taking   albuterol  (VENTOLIN  HFA) 108 (90 Base) MCG/ACT inhaler Inhale 2 puffs into the lungs every 6 (six) hours as needed for wheezing or shortness of breath. 8 g 2 Unknown   apixaban  (ELIQUIS ) 5 MG TABS tablet Take 1 tablet (5 mg total) by mouth 2 (two) times daily. 60 tablet 3 10/14/2023 Morning   betamethasone , augmented, (DIPROLENE ) 0.05 % lotion Apply 1 Application topically 2 (two) times daily.   Unknown   chlorthalidone  (HYGROTON ) 25 MG tablet TAKE 1 TABLET BY MOUTH DAILY 90 tablet 2 10/14/2023   Cholecalciferol  (VITAMIN D3) 125 MCG (  5000 UT) CAPS Take 1 capsule (5,000 Units total) by mouth daily. 90 capsule 1 10/14/2023   diclofenac  sodium (VOLTAREN ) 1 % GEL Apply 2 g topically 2 (two) times daily as needed (pain).    Unknown   docusate sodium  (COLACE) 100 MG capsule Take 1 capsule (100 mg total) by mouth 2 (two) times daily. 60 capsule 0 Unknown   dorzolamide -timolol  (COSOPT ) 2-0.5 % ophthalmic solution Place 1 drop into both eyes 2 (two) times daily.   10/14/2023   doxepin  (SINEQUAN ) 10 MG capsule TAKE 1 CAPSULE BY MOUTH AT BEDTIME 30 capsule 0 Past Week   DULoxetine  (CYMBALTA ) 60 MG capsule TAKE 1 CAPSULE BY MOUTH DAILY 90 capsule 2 10/14/2023   esomeprazole  (NEXIUM ) 40 MG capsule Take 1 capsule (40 mg total) by mouth daily. 90 capsule 3 10/14/2023   ferrous sulfate  325 (65 FE) MG tablet Take 325 mg by mouth daily with breakfast.   10/14/2023   fluticasone  (FLONASE ) 50 MCG/ACT nasal spray Place 2 sprays into both nostrils daily as needed for allergies. 16 g  11 Unknown   furosemide  (LASIX ) 20 MG tablet TAKE 1 TABLET BY MOUTH DAILY 90 tablet 1 10/14/2023   hydrOXYzine  (ATARAX ) 25 MG tablet Take 25 mg by mouth at bedtime as needed (insomnia).   Unknown   insulin  glargine (LANTUS  SOLOSTAR) 100 UNIT/ML Solostar Pen Inject 30 Units into the skin at bedtime. 15 mL 2 10/13/2023   insulin  lispro (HUMALOG ) 100 UNIT/ML KwikPen Inject 10 Units into the skin 3 (three) times daily. (Patient taking differently: Inject 3-15 Units into the skin 3 (three) times daily.) 15 mL 2 10/14/2023 Morning   lactulose  (CHRONULAC ) 10 GM/15ML solution Take 15 mLs (10 g total) by mouth 2 (two) times daily as needed for mild constipation, moderate constipation or severe constipation. 236 mL 0 Unknown   losartan  (COZAAR ) 50 MG tablet TAKE 1 TABLET BY MOUTH DAILY 90 tablet 1 10/14/2023   metFORMIN  (GLUCOPHAGE ) 500 MG tablet Take 1 tablet (500 mg total) by mouth 2 (two) times daily. 180 tablet 1 10/14/2023   potassium chloride  SA (K-DUR,KLOR-CON ) 20 MEQ tablet Take 20 mEq by mouth daily.   10/14/2023   pravastatin  (PRAVACHOL ) 40 MG tablet TAKE 1 TABLET BY MOUTH DAILY 90 tablet 3 Past Week   traMADol  (ULTRAM ) 50 MG tablet TAKE 1 TABLET BY MOUTH EVERY 8 HOURS AS NEEDED 90 tablet 1 10/14/2023   vitamin B-12 (CYANOCOBALAMIN ) 1000 MCG tablet Take 1,000 mcg by mouth daily.   10/14/2023   Accu-Chek Softclix Lancets lancets Use with device to check sugars twice daily 100 each 12    glucose blood (ACCU-CHEK GUIDE TEST) test strip CHECK BLOOD SUGAR TWICE DAILY 200 each 1    Insulin  Pen Needle (BD PEN NEEDLE NANO U/F) 32G X 4 MM MISC 1 each by Does not apply route in the morning, at noon, in the evening, and at bedtime. 200 each 11    isosorbide  mononitrate (IMDUR ) 30 MG 24 hr tablet Take 1 tablet (30 mg total) by mouth daily. (Patient not taking: Reported on 10/15/2023) 90 tablet 1 Not Taking   polyethylene glycol powder (GLYCOLAX /MIRALAX ) powder 2 cap fulls in a full glass of water, two times a day, for 5  days. (Patient not taking: Reported on 10/15/2023) 255 g 0 Not Taking   prednisoLONE  acetate (PRED FORTE ) 1 % ophthalmic suspension Place 1 drop into the right eye 2 (two) times daily. (Patient not taking: Reported on 10/15/2023)   Not Taking   Scheduled:  Chlorhexidine  Gluconate Cloth  6 each Topical Q0600   cholecalciferol   5,000 Units Oral Daily   ciprofloxacin   2 drop Right Eye Q4H while awake   cyanocobalamin   1,000 mcg Oral Daily   dorzolamide -timolol   1 drop Both Eyes BID   doxepin   10 mg Oral QHS   DULoxetine   60 mg Oral Daily   insulin  aspart  0-6 Units Subcutaneous TID WC   insulin  glargine  6 Units Subcutaneous QHS   losartan   25 mg Oral Daily   nitrofurantoin  (macrocrystal-monohydrate)  100 mg Oral Q12H   pantoprazole   40 mg Oral Daily   potassium chloride  SA  20 mEq Oral Daily   pravastatin   40 mg Oral Daily   triamcinolone  ointment   Topical BID   Infusions:   sodium chloride  75 mL/hr at 10/18/23 2332    ceFAZolin  (ANCEF ) IV     heparin  1,000 Units/hr (10/18/23 2332)    Assessment: 83 yo F to start heparin  drip for foot pain. PMH of DVT (but unsure when this was) and aflutter on apixaban  PTA. Last dose apixaban  8/26 @0817 . CHADSVASc of 6. Plan for angiogram on 9/29. Will use aPTT to monitor heparin  due to false elevation of heparin  level. Hgb and plt remain stable. Pt did have a low hgb of 6.5 on 8/26 and pt received blood transfusion.    Goal of Therapy:  Heparin  level 0.3-0.7 units/ml once aPTT and heparin  level correlate.  aPTT 66-102 seconds Monitor platelets by anticoagulation protocol: Yes  8/28@0157 : aPTT 103, SUPRAtherapeutic; 1300 units/hr 8/28@1201 : aPTT  94,  therapeutic x1; decr to 1200 units/hr                        HL 0.83,  no correlation 8/28@2014 : aPTT 175  SUPRAtherapeutic; decr to 1000 units/hr 8/29@0553      HL: 0.56  no correlation        @0916   aPTT   55   SUBtherapeutic  Plan:  aPTT subtherapeutic --> bolus 2000 units and incr to 1100  units/hr Check aPTT in 8 hours to monitor Heparin  level and CBC daily while on heparin  Switch to heparin  level once heparin  level and aPTT correlate  Leonor JAYSON Argyle, PharmD PGY1 10/19/2023 10:48 AM

## 2023-10-19 NOTE — Assessment & Plan Note (Addendum)
 With angiogram, patient had bilateral iliac stents by Dr. Jama on 8/29.  Patient on heparin  drip before procedure.  Patient was on Eliquis  as outpatient but was held on 8/26 with drop in hemoglobin and then she had severe pain in her left foot on 8/27.  Patient will be discharged home on Eliquis  and aspirin .

## 2023-10-19 NOTE — Progress Notes (Signed)
 Progress Note   Patient: Wanda Gardner FMW:[id number] DOB: [birth year] DOA: 10/15/2023     1 DOS: the patient was seen and examined on 10/19/2023   Brief hospital course: 83 y.o. female with medical history significant for GERD, osteoarthritis, asthma, type 2 diabetes mellitus, hypertension, dyslipidemia, OSA and glaucoma, presented to the emergency room with acute onset of syncope with associated hypoglycemia.  She denied any nausea or vomiting or diarrhea or abdominal pain.  No fever or chills.  She stated she has not eaten anything today.  She is not sure if she took her Lantus  last night.  No chest pain or palpitations.  No cough or wheezing or dyspnea.  No dysuria, oliguria or hematuria or flank pain.   ED Course: When she came to the ER, BP was 138/93 with temperature 97.5 otherwise normal vital signs.  Labs revealed blood glucose of 164 and calcium 8.5 with albumin 3.2 and otherwise unremarkable CMP.  High-sensitivity troponin I was 609 and total CK was 103.  CBC showed anemia slightly worse than previous levels with microcytosis and thrombocytosis. EKG as reviewed by me : EKG showed atrial fibrillation with controlled ventricular sponsor of 63 and PVCs with low voltage QRS and anteroseptal Q waves. Imaging: Portable chest x-ray showed low lung volumes with no acute cardiopulmonary disease. Head and neck CT revealed no acute intracranial normality and no acute displaced fracture or traumatic listhesis of the cervical spine.   The patient was given an amp of D50 followed by D10 W at 100 mL/h.  She will be admitted to an observation medical telemetry bed for further evaluation and management.  8/26.  Patient given a unit of blood for hemoglobin dropping down to 6.9.  Eliquis  held 8/27.  Patient complaining of left foot pain.  Vascular team to do angiogram on Friday.  Heparin  drip started.  Need to watch hemoglobin closely. 8/28.  Patient's foot feels a little less pain.  For angiogram  tomorrow.  Assessment and Plan: * Ischemic pain of left foot With angiogram patient had bilateral iliac stents by Dr. Jama on 8/29.  Patient on heparin  drip before procedure.  Patient was on Eliquis  as outpatient but was held on 8/26 with drop in hemoglobin and then she had severe pain in her left foot on 8/27.  Hypoglycemia Discontinued D10 drip on 8/28.  Patient on sliding scale insulin .  Patient only eats 1 meal a day and likely will not need as much insulin  upon going home.  Low-dose Semglee  insulin  at night.  Acute blood loss anemia Patient given 1 unit of packed red blood cells on 8/26.  Last hemoglobin 10.1.  Ferritin 4.  IV iron  given on 8/28.  Iron  deficiency anemia.  Dyslipidemia On pravastatin   CKD stage 3a, GFR 45-59 ml/min (HCC) Today's creatinine actually the best it has been at 0.85 with a GFR greater than 60  Hypokalemia Replaced  Conjunctivitis, right eye Ciloxan  eyedrops  Essential hypertension On Cozaar .   Atrial fibrillation with controlled ventricular rate (HCC) Patient on heparin  drip this morning prior to procedure.  Tobacco abuse Smoking cessation recommended        Subjective: Patient seen this morning.  Still had some pain in her foot but a little bit better.  Initially admitted with hypoglycemia.  Developed left foot pain.  Physical Exam: Vitals:   10/19/23 1409 10/19/23 1419 10/19/23 1430 10/19/23 1445  BP:  138/70 (!) 150/63 (!) 162/66  Pulse:  62 61 62  Resp: 12 12 18  17  Temp:  97.8 F (36.6 C)  97.8 F (36.6 C)  TempSrc:  Oral  Oral  SpO2: 95% 92% 93% 90%  Weight:      Height:       Physical Exam HENT:     Head: Normocephalic.     Mouth/Throat:     Pharynx: No oropharyngeal exudate.  Eyes:     General: Lids are normal.     Conjunctiva/sclera: Conjunctivae normal.  Cardiovascular:     Rate and Rhythm: Normal rate and regular rhythm.     Heart sounds: Normal heart sounds, S1 normal and S2 normal.  Pulmonary:     Breath  sounds: No decreased breath sounds, wheezing, rhonchi or rales.  Abdominal:     Palpations: Abdomen is soft.     Tenderness: There is no abdominal tenderness.  Musculoskeletal:     Right lower leg: No swelling.     Left lower leg: No swelling.  Skin:    General: Skin is warm.     Findings: No rash.  Neurological:     Mental Status: She is alert and oriented to person, place, and time.     Data Reviewed: Creatinine 0.85, white blood cell count 5.0, hemoglobin 10.1, platelet count 395  Family Communication:   Disposition: Status is: Inpatient Remains inpatient appropriate because:   Planned Discharge Destination: Home    Time spent: 28 minutes  Author: Charlie Patterson, MD 10/19/2023 3:05 PM  For on call review [email or web address].

## 2023-10-19 NOTE — Interval H&P Note (Signed)
 History and Physical Interval Note:  10/19/2023 2:20 PM  Wanda Gardner  has presented today for surgery, with the diagnosis of LLE Angio   ASO w rest pain.  The various methods of treatment have been discussed with the patient and family. After consideration of risks, benefits and other options for treatment, the patient has consented to  Procedure(s): LOWER EXTREMITY INTERVENTION (Left) Lower Extremity Angiography (Left) as a surgical intervention.  The patient's history has been reviewed, patient examined, no change in status, stable for surgery.  I have reviewed the patient's chart and labs.  Questions were answered to the patient's satisfaction.     Cordella Shawl

## 2023-10-20 DIAGNOSIS — D62 Acute posthemorrhagic anemia: Secondary | ICD-10-CM | POA: Diagnosis not present

## 2023-10-20 DIAGNOSIS — E162 Hypoglycemia, unspecified: Secondary | ICD-10-CM | POA: Diagnosis not present

## 2023-10-20 DIAGNOSIS — N390 Urinary tract infection, site not specified: Secondary | ICD-10-CM | POA: Insufficient documentation

## 2023-10-20 DIAGNOSIS — E785 Hyperlipidemia, unspecified: Secondary | ICD-10-CM | POA: Diagnosis not present

## 2023-10-20 DIAGNOSIS — B9620 Unspecified Escherichia coli [E. coli] as the cause of diseases classified elsewhere: Secondary | ICD-10-CM

## 2023-10-20 DIAGNOSIS — M79672 Pain in left foot: Secondary | ICD-10-CM | POA: Diagnosis not present

## 2023-10-20 LAB — CBC
HCT: 30.4 % — ABNORMAL LOW (ref 36.0–46.0)
Hemoglobin: 9.6 g/dL — ABNORMAL LOW (ref 12.0–15.0)
MCH: 24 pg — ABNORMAL LOW (ref 26.0–34.0)
MCHC: 31.6 g/dL (ref 30.0–36.0)
MCV: 76 fL — ABNORMAL LOW (ref 80.0–100.0)
Platelets: 399 K/uL (ref 150–400)
RBC: 4 MIL/uL (ref 3.87–5.11)
RDW: 21.5 % — ABNORMAL HIGH (ref 11.5–15.5)
WBC: 6.2 K/uL (ref 4.0–10.5)
nRBC: 0 % (ref 0.0–0.2)

## 2023-10-20 LAB — BASIC METABOLIC PANEL WITH GFR
Anion gap: 11 (ref 5–15)
BUN: 14 mg/dL (ref 8–23)
CO2: 25 mmol/L (ref 22–32)
Calcium: 8.6 mg/dL — ABNORMAL LOW (ref 8.9–10.3)
Chloride: 100 mmol/L (ref 98–111)
Creatinine, Ser: 0.95 mg/dL (ref 0.44–1.00)
GFR, Estimated: >60 mL/min (ref >60)
Glucose, Bld: 172 mg/dL — ABNORMAL HIGH (ref 70–99)
Potassium: 4.3 mmol/L (ref 3.5–5.1)
Sodium: 136 mmol/L (ref 135–145)

## 2023-10-20 LAB — GLUCOSE, CAPILLARY
Glucose-Capillary: 177 mg/dL — ABNORMAL HIGH (ref 70–99)
Glucose-Capillary: 169 mg/dL — ABNORMAL HIGH (ref 70–99)
Glucose-Capillary: 322 mg/dL — ABNORMAL HIGH (ref 70–99)

## 2023-10-20 MED ORDER — POLYETHYLENE GLYCOL 3350 17 G PO PACK: 17.0000 g | PACK | Freq: Every day | ORAL | 0 refills | Status: AC

## 2023-10-20 MED ORDER — ASPIRIN 81 MG PO TBEC: 81.0000 mg | DELAYED_RELEASE_TABLET | Freq: Every day | ORAL | 0 refills | Status: AC

## 2023-10-20 MED ORDER — APIXABAN 5 MG PO TABS: 5.0000 mg | ORAL_TABLET | Freq: Two times a day (BID) | ORAL | 0 refills | Status: DC

## 2023-10-20 MED ORDER — POLYETHYLENE GLYCOL 3350 17 G PO PACK
17.0000 g | PACK | Freq: Every day | ORAL | Status: DC
  Administered 2023-10-20: 17 g via ORAL
  Filled 2023-10-20: qty 1

## 2023-10-20 MED ORDER — CIPROFLOXACIN HCL 0.3 % OP SOLN: 2.0000 [drp] | OPHTHALMIC | 0 refills | Status: AC

## 2023-10-20 MED ORDER — OXYCODONE HCL 5 MG PO TABS: 5.0000 mg | ORAL_TABLET | Freq: Four times a day (QID) | ORAL | 0 refills | Status: DC | PRN

## 2023-10-20 MED ORDER — LANTUS SOLOSTAR 100 UNIT/ML ~~LOC~~ SOPN: 9.0000 [IU] | PEN_INJECTOR | Freq: Every day | SUBCUTANEOUS | Status: DC

## 2023-10-20 MED ORDER — NITROFURANTOIN MONOHYD MACRO 100 MG PO CAPS: 100.0000 mg | ORAL_CAPSULE | Freq: Two times a day (BID) | ORAL | 0 refills | Status: AC

## 2023-10-20 MED ORDER — INSULIN LISPRO (1 UNIT DIAL) 100 UNIT/ML (KWIKPEN): 2.0000 [IU] | PEN_INJECTOR | Freq: Three times a day (TID) | SUBCUTANEOUS | Status: AC

## 2023-10-20 NOTE — Progress Notes (Signed)
 PT Cancellation Note  Patient Details Name: Wanda Gardner MRN: [id number] DOB: [birth year]   Cancelled Treatment:    Reason Eval/Treat Not Completed: Patient declined, no reason specified (Chart reviewed for planned treatment session.  Patient politely declining participation with session at this time, I just walked a lap with the other therapist.  Request therapist allow her to rest and re-attempt at later time/date.  Will continue efforts as appropriate.)   Wetzel Meester H. Delores, PT, DPT, NCS 10/20/23, 11:54 AM [phone]

## 2023-10-20 NOTE — Progress Notes (Signed)
 Occupational Therapy Treatment Patient Details Name: Wanda Gardner MRN: [id number] DOB: 12-16-1940 Today's Date: 10/20/2023   History of present illness Pt is an 83 y.o. female with medical history significant for GERD, osteoarthritis, asthma, type 2 diabetes mellitus, hypertension, dyslipidemia, OSA and glaucoma, presented to the emergency room with acute onset of syncope with associated hypoglycemia.  Ik:Ybenhobrzfpj, Essential hypertension, Atrial fibrillation with controlled ventricular rate, Dyslipidemia.   OT comments  Upon entering the room, pt supine in bed and agreeable to OT intervention. Pt performs supine >sit with supervision. OT removes purewick. Pt stands with CGA and ambulates to bathroom with CGA. Pt able to void on commode and performs hygiene while seated showing good safety awareness. Pt stands with min guard and performs standing hand hygiene with supervision. Pt ambulates 150' with RW with CGA progressing to supervision. Pt returning back to bed at end of session with call bell and all needed items within reach. Prevalon boots donned.       If plan is discharge home, recommend the following:  A little help with walking and/or transfers;A little help with bathing/dressing/bathroom;Assistance with cooking/housework   Equipment Recommendations  Other (comment) (2WW)       Precautions / Restrictions Precautions Precautions: Fall       Mobility Bed Mobility Overal bed mobility: Needs Assistance Bed Mobility: Supine to Sit, Sit to Supine     Supine to sit: Supervision Sit to supine: Supervision        Transfers Overall transfer level: Needs assistance Equipment used: Rolling walker (2 wheels) Transfers: Sit to/from Stand Sit to Stand: Contact guard assist                 Balance Overall balance assessment: Needs assistance Sitting-balance support: Single extremity supported Sitting balance-Leahy Scale: Good     Standing balance support: Bilateral  upper extremity supported, During functional activity, Reliant on assistive device for balance Standing balance-Leahy Scale: Fair                             ADL either performed or assessed with clinical judgement   ADL Overall ADL's : Needs assistance/impaired                         Toilet Transfer: Rolling walker (2 wheels);Contact guard assist;Regular Social worker and Hygiene: Contact guard assist;Sit to/from stand              Extremity/Trunk Assessment Upper Extremity Assessment Upper Extremity Assessment: Generalized weakness   Lower Extremity Assessment Lower Extremity Assessment: Generalized weakness        Vision Patient Visual Report: No change from baseline           Communication Communication Communication: No apparent difficulties   Cognition Arousal: Alert Behavior During Therapy: WFL for tasks assessed/performed Cognition: No apparent impairments                               Following commands: Intact        Cueing   Cueing Techniques: Verbal cues             Pertinent Vitals/ Pain       Pain Assessment Pain Assessment: No/denies pain Faces Pain Scale: Hurts little more Pain Location: No pain reported during session while seated EOB Pain Descriptors / Indicators: Aching, Grimacing, Discomfort Pain Intervention(s): Monitored  during session, Repositioned         Frequency  Min 2X/week        Progress Toward Goals  OT Goals(current goals can now be found in the care plan section)  Progress towards OT goals: Progressing toward goals      AM-PAC OT 6 Clicks Daily Activity     Outcome Measure   Help from another person eating meals?: None Help from another person taking care of personal grooming?: A Little Help from another person toileting, which includes using toliet, bedpan, or urinal?: A Little Help from another person bathing (including washing,  rinsing, drying)?: A Little Help from another person to put on and taking off regular upper body clothing?: A Little Help from another person to put on and taking off regular lower body clothing?: A Little 6 Click Score: 19    End of Session Equipment Utilized During Treatment: Gait belt;Rolling walker (2 wheels)  OT Visit Diagnosis: Unsteadiness on feet (R26.81);Muscle weakness (generalized) (M62.81)   Activity Tolerance Patient tolerated treatment well   Patient Left in bed;with call bell/phone within reach   Nurse Communication Mobility status        Time: 8983-8964 OT Time Calculation (min): 19 min  Charges: OT General Charges $OT Visit: 1 Visit OT Treatments $Self Care/Home Management : 8-22 mins  Izetta Claude, MS, OTR/L , CBIS ascom [phone]  10/20/23, 12:02 PM

## 2023-10-20 NOTE — TOC Progression Note (Addendum)
 Transition of Care [facility]) - Progression Note    Patient Details  Name: Wanda Gardner MRN: [id number] Date of Birth: 01-10-1941  [facility] CM/SW Contact  [place] L Ahjanae Cassel, [place] Phone Number: 10/20/2023, 7:52 AM  Clinical Narrative:     BSC and RW ordered through [employer]. DME will be delivered to patient room.    9:23am: The patient received a rollator on 07/21/2022. Since the patient has Medicaid, we are unable to charge out of pocket cost.                 Expected Discharge Plan and Services                                               Social Drivers of Health (SDOH) Interventions SDOH Screenings   Food Insecurity: No Food Insecurity (10/15/2023)  Housing: Low Risk  (10/15/2023)  Transportation Needs: No Transportation Needs (10/15/2023)  Utilities: At Risk (10/15/2023)  Depression (PHQ2-9): High Risk (05/07/2023)  Social Connections: Moderately Isolated (10/15/2023)  Tobacco Use: High Risk (10/19/2023)    Readmission Risk Interventions     No data to display

## 2023-10-20 NOTE — Assessment & Plan Note (Signed)
 Not sure if this is a real infection or not since urine analysis looks negative but I will treat with nitrofurantoin  course.

## 2023-10-20 NOTE — Discharge Summary (Signed)
 Physician Discharge Summary   Patient: Wanda Gardner MRN: [id number] DOB: 12-03-1940  Admit date:     10/15/2023  Discharge date: 10/20/23  Discharge Physician: Charlie Patterson   PCP: Orlean Alan HERO, FNP   Recommendations at discharge:   Follow-up PCP 5 days Follow-up vascular surgery Refer to hematology to closely watch blood counts.  Discharge Diagnoses: Principal Problem:   Ischemic pain of left foot Active Problems:   Left foot pain   Hypoglycemia   Acute blood loss anemia   Dyslipidemia   Tobacco abuse   Atrial fibrillation with controlled ventricular rate (HCC)   Essential hypertension   Atherosclerosis of native artery of left lower extremity with rest pain (HCC)   Conjunctivitis, right eye   Hypokalemia   CKD stage 3a, GFR 45-59 ml/min (HCC)   E. coli UTI  Hospital Course: 83 y.o. female with medical history significant for GERD, osteoarthritis, asthma, type 2 diabetes mellitus, hypertension, dyslipidemia, OSA and glaucoma, presented to the emergency room with acute onset of syncope with associated hypoglycemia.  She denied any nausea or vomiting or diarrhea or abdominal pain.  No fever or chills.  She stated she has not eaten anything today.  She is not sure if she took her Lantus  last night.  No chest pain or palpitations.  No cough or wheezing or dyspnea.  No dysuria, oliguria or hematuria or flank pain.   ED Course: When she came to the ER, BP was 138/93 with temperature 97.5 otherwise normal vital signs.  Labs revealed blood glucose of 164 and calcium 8.5 with albumin 3.2 and otherwise unremarkable CMP.  High-sensitivity troponin I was 609 and total CK was 103.  CBC showed anemia slightly worse than previous levels with microcytosis and thrombocytosis. EKG as reviewed by me : EKG showed atrial fibrillation with controlled ventricular sponsor of 63 and PVCs with low voltage QRS and anteroseptal Q waves. Imaging: Portable chest x-ray showed low lung volumes with no  acute cardiopulmonary disease. Head and neck CT revealed no acute intracranial normality and no acute displaced fracture or traumatic listhesis of the cervical spine.   The patient was given an amp of D50 followed by D10 W at 100 mL/h.  She will be admitted to an observation medical telemetry bed for further evaluation and management.  8/26.  Patient given a unit of blood for hemoglobin dropping down to 6.9.  Eliquis  held 8/27.  Patient complaining of left foot pain.  Vascular team to do angiogram on Friday.  Heparin  drip started.  Need to watch hemoglobin closely. 8/28.  Patient's foot feels a little less pain.  For angiogram tomorrow. 8/29.  Patient had bilateral iliac stents.  Creatinine 0.85 and hemoglobin 10.1. 8/30.  Cleared by vascular surgery to go home and follow-up as outpatient.  Hemoglobin 9.6 upon discharge and creatinine 0.95.  Patient will discharge home on 9 units of long-acting insulin  at night.  Assessment and Plan: * Ischemic pain of left foot With angiogram, patient had bilateral iliac stents by Dr. Jama on 8/29.  Patient on heparin  drip before procedure.  Patient was on Eliquis  as outpatient but was held on 8/26 with drop in hemoglobin and then she had severe pain in her left foot on 8/27.  Patient will be discharged home on Eliquis  and aspirin .  Hypoglycemia Discontinued D10 drip on 8/28.  Patient on sliding scale insulin .  Patient only eats 1 meal a day and likely will not need as much insulin  upon going home.  Low-dose long-acting  insulin  9 units at night plus sliding scale.  Acute blood loss anemia Patient given 1 unit of packed red blood cells on 8/26.  Last hemoglobin 9.6.  Ferritin 4.  IV iron  given on 8/28.  Iron  deficiency anemia.  Refer to hematology for iron  infusions as outpatient.  Patient was advised to look for signs of bleeding being on Eliquis  and aspirin .  Dyslipidemia On pravastatin   E. coli UTI Not sure if this is a real infection or not since  urine analysis looks negative but I will treat with nitrofurantoin  course.  CKD stage 3a, GFR 45-59 ml/min (HCC) Creatinine 0.95 with a GFR greater than 60  Hypokalemia Replaced  Conjunctivitis, right eye Ciloxan  eyedrops  Essential hypertension On Cozaar .   Atrial fibrillation with controlled ventricular rate (HCC) Continue Eliquis  for anticoagulation to prevent stroke  Tobacco abuse Smoking cessation recommended         Consultants: Vascular surgery Procedures performed: Bilateral iliac stents Disposition: Home health Diet recommendation:  Cardiac and Carb modified diet DISCHARGE MEDICATION: Allergies as of 10/20/2023   No Known Allergies      Medication List     STOP taking these medications    chlorthalidone  25 MG tablet Commonly known as: HYGROTON    isosorbide  mononitrate 30 MG 24 hr tablet Commonly known as: IMDUR    metFORMIN  500 MG tablet Commonly known as: GLUCOPHAGE    polyethylene glycol powder 17 GM/SCOOP powder Commonly known as: GLYCOLAX /MIRALAX  Replaced by: polyethylene glycol 17 g packet   prednisoLONE  acetate 1 % ophthalmic suspension Commonly known as: PRED FORTE    traMADol  50 MG tablet Commonly known as: ULTRAM        TAKE these medications    Accu-Chek Guide Test test strip Generic drug: glucose blood CHECK BLOOD SUGAR TWICE DAILY   Accu-Chek Softclix Lancets lancets Use with device to check sugars twice daily   albuterol  108 (90 Base) MCG/ACT inhaler Commonly known as: VENTOLIN  HFA Inhale 2 puffs into the lungs every 6 (six) hours as needed for wheezing or shortness of breath.   apixaban  5 MG Tabs tablet Commonly known as: Eliquis  Take 1 tablet (5 mg total) by mouth 2 (two) times daily.   aspirin  EC 81 MG tablet Take 1 tablet (81 mg total) by mouth daily. Swallow whole. Start taking on: October 21, 2023   BD Pen Needle Nano U/F 32G X 4 MM Misc Generic drug: Insulin  Pen Needle 1 each by Does not apply route in the  morning, at noon, in the evening, and at bedtime.   betamethasone  (augmented) 0.05 % lotion Commonly known as: DIPROLENE  Apply 1 Application topically 2 (two) times daily.   ciprofloxacin  0.3 % ophthalmic solution Commonly known as: CILOXAN  Place 2 drops into the right eye every 4 (four) hours while awake for 5 days. Administer 1 drop, every 2 hours, while awake, for 2 days. Then 1 drop, every 4 hours, while awake, for the next 5 days.   cyanocobalamin  1000 MCG tablet Commonly known as: VITAMIN B12 Take 1,000 mcg by mouth daily.   diclofenac  sodium 1 % Gel Commonly known as: VOLTAREN  Apply 2 g topically 2 (two) times daily as needed (pain).   docusate sodium  100 MG capsule Commonly known as: COLACE Take 1 capsule (100 mg total) by mouth 2 (two) times daily.   dorzolamide -timolol  2-0.5 % ophthalmic solution Commonly known as: COSOPT  Place 1 drop into both eyes 2 (two) times daily.   doxepin  10 MG capsule Commonly known as: SINEQUAN  TAKE 1 CAPSULE BY MOUTH AT  BEDTIME   DULoxetine  60 MG capsule Commonly known as: CYMBALTA  TAKE 1 CAPSULE BY MOUTH DAILY   esomeprazole  40 MG capsule Commonly known as: NEXIUM  Take 1 capsule (40 mg total) by mouth daily.   ferrous sulfate  325 (65 FE) MG tablet Take 325 mg by mouth daily with breakfast.   fluticasone  50 MCG/ACT nasal spray Commonly known as: FLONASE  Place 2 sprays into both nostrils daily as needed for allergies.   furosemide  20 MG tablet Commonly known as: LASIX  TAKE 1 TABLET BY MOUTH DAILY   hydrOXYzine  25 MG tablet Commonly known as: ATARAX  Take 25 mg by mouth at bedtime as needed (insomnia).   insulin  lispro 100 UNIT/ML KwikPen Commonly known as: HUMALOG  Inject 2 Units into the skin 3 (three) times daily with meals. If eating and Blood Glucose (BG) 80 or higher inject 2 units for meal coverage and add correction dose per scale. If not eating, correction dose only. BG <150= 0 unit; BG 150-200= 1 unit; BG 201-250= 2  unit; BG 251-300= 3 unit; BG 301-350= 4 unit; BG 351-400= 5 unit; BG >400= 6 unit and Call Primary Care. What changed:  how much to take when to take this additional instructions   lactulose  10 GM/15ML solution Commonly known as: CHRONULAC  Take 15 mLs (10 g total) by mouth 2 (two) times daily as needed for mild constipation, moderate constipation or severe constipation.   Lantus  SoloStar 100 UNIT/ML Solostar Pen Generic drug: insulin  glargine Inject 9 Units into the skin at bedtime. What changed: how much to take   losartan  50 MG tablet Commonly known as: COZAAR  TAKE 1 TABLET BY MOUTH DAILY   nitrofurantoin  (macrocrystal-monohydrate) 100 MG capsule Commonly known as: MACROBID  Take 1 capsule (100 mg total) by mouth every 12 (twelve) hours for 5 doses.   oxyCODONE  5 MG immediate release tablet Commonly known as: Oxy IR/ROXICODONE  Take 1 tablet (5 mg total) by mouth every 6 (six) hours as needed for severe pain (pain score 7-10).   polyethylene glycol 17 g packet Commonly known as: MIRALAX  / GLYCOLAX  Take 17 g by mouth daily. Start taking on: October 21, 2023 Replaces: polyethylene glycol powder 17 GM/SCOOP powder   potassium chloride  SA 20 MEQ tablet Commonly known as: KLOR-CON  M Take 20 mEq by mouth daily.   pravastatin  40 MG tablet Commonly known as: PRAVACHOL  TAKE 1 TABLET BY MOUTH DAILY   Vitamin D3 125 MCG (5000 UT) Caps Take 1 capsule (5,000 Units total) by mouth daily.               Durable Medical Equipment  (From admission, onward)           Start     Ordered   10/18/23 1428  For home use only DME Walker rolling  Once       Question Answer Comment  Walker: With 5 Inch Wheels   Patient needs a walker to treat with the following condition Weakness      10/18/23 1427   10/18/23 1427  For home use only DME Bedside commode  Once       Question:  Patient needs a bedside commode to treat with the following condition  Answer:  Weakness   10/18/23 1427             Follow-up Information     Orlean Alan HERO, FNP Follow up in 5 day(s).   Specialty: Family Medicine Contact information: [address] [place] [address] [phone]  Schnier, Cordella MATSU, MD Follow up.   Specialties: Vascular Surgery, Cardiology, Radiology, Vascular Surgery Why: call office on Tuesdat to set up follow up appointment Contact information: [address] [place] [address] [phone]         Rennie Cindy SAUNDERS, MD Follow up.   Specialties: Internal Medicine, Oncology Why: call to set up appointment for anemia follow up and iron  infusions Contact information: [address] [place] [address] [phone]                Discharge Exam: Filed Weights   10/15/23 1532 10/15/23 2229 10/19/23 1207  Weight: 82.6 kg 92.3 kg 92.3 kg   Physical Exam HENT:     Head: Normocephalic.     Mouth/Throat:     Pharynx: No oropharyngeal exudate.  Eyes:     General: Lids are normal.     Conjunctiva/sclera: Conjunctivae normal.  Cardiovascular:     Rate and Rhythm: Normal rate and regular rhythm.     Heart sounds: Normal heart sounds, S1 normal and S2 normal.  Pulmonary:     Breath sounds: No decreased breath sounds, wheezing, rhonchi or rales.  Abdominal:     Palpations: Abdomen is soft.     Tenderness: There is no abdominal tenderness.  Musculoskeletal:     Right lower leg: No swelling.     Left lower leg: No swelling.  Skin:    General: Skin is warm.     Findings: No rash.  Neurological:     Mental Status: She is alert and oriented to person, place, and time.      Condition at discharge: stable  The results of significant diagnostics from this hospitalization (including imaging, microbiology, ancillary and laboratory) are listed below for reference.   Imaging Studies: PERIPHERAL VASCULAR CATHETERIZATION Result Date: 10/19/2023 See surgical note for result.  CT Head Wo  Contrast Result Date: 10/15/2023 CLINICAL DATA:  fall EXAM: CT HEAD WITHOUT CONTRAST CT CERVICAL SPINE WITHOUT CONTRAST TECHNIQUE: Multidetector CT imaging of the head and cervical spine was performed following the standard protocol without intravenous contrast. Multiplanar CT image reconstructions of the cervical spine were also generated. RADIATION DOSE REDUCTION: This exam was performed according to the departmental dose-optimization program which includes automated exposure control, adjustment of the mA and/or kV according to patient size and/or use of iterative reconstruction technique. COMPARISON:  CT angio chest 06/10/2022, FINDINGS: CT HEAD FINDINGS Brain: Patchy and confluent areas of decreased attenuation are noted throughout the deep and periventricular white matter of the cerebral hemispheres bilaterally, compatible with chronic microvascular ischemic disease. No evidence of large-territorial acute infarction. No parenchymal hemorrhage. No mass lesion. No extra-axial collection. No mass effect or midline shift. No hydrocephalus. Basilar cisterns are patent. Vascular: No hyperdense vessel. Atherosclerotic calcifications are present within the cavernous internal carotid arteries. Skull: No acute fracture or focal lesion. Right lamina papyracea fracture. Sinuses/Orbits: Paranasal sinuses and mastoid air cells are clear. Bilateral lens replacement. Otherwise the orbits are unremarkable. Other: None. CT CERVICAL SPINE FINDINGS Alignment: Normal. Skull base and vertebrae: Multilevel moderate severe degenerative changes of the spine with associated multilevel moderate osseous neural foraminal stenosis. No severe osseous central canal stenosis. Multilevel cervical spine vertebral body height loss. No acute fracture. No aggressive appearing focal osseous lesion or focal pathologic process. Soft tissues and spinal canal: No prevertebral fluid or swelling. No visible canal hematoma. Upper chest: Stable 6 x 4 mm  right apical pulmonary nodule-no further follow-up indicated. Other: None. IMPRESSION: 1.  No acute intracranial abnormality. 2. No acute displaced fracture or traumatic listhesis of the cervical spine. Electronically Signed   By: Morgane  Naveau M.D.   On: 10/15/2023 18:42   CT Cervical Spine Wo Contrast Result Date: 10/15/2023 CLINICAL DATA:  fall EXAM: CT HEAD WITHOUT CONTRAST CT CERVICAL SPINE WITHOUT CONTRAST TECHNIQUE: Multidetector CT imaging of the head and cervical spine was performed following the standard protocol without intravenous contrast. Multiplanar CT image reconstructions of the cervical spine were also generated. RADIATION DOSE REDUCTION: This exam was performed according to the departmental dose-optimization program which includes automated exposure control, adjustment of the mA and/or kV according to patient size and/or use of iterative reconstruction technique. COMPARISON:  CT angio chest 06/10/2022, FINDINGS: CT HEAD FINDINGS Brain: Patchy and confluent areas of decreased attenuation are noted throughout the deep and periventricular white matter of the cerebral hemispheres bilaterally, compatible with chronic microvascular ischemic disease. No evidence of large-territorial acute infarction. No parenchymal hemorrhage. No mass lesion. No extra-axial collection. No mass effect or midline shift. No hydrocephalus. Basilar cisterns are patent. Vascular: No hyperdense vessel. Atherosclerotic calcifications are present within the cavernous internal carotid arteries. Skull: No acute fracture or focal lesion. Right lamina papyracea fracture. Sinuses/Orbits: Paranasal sinuses and mastoid air cells are clear. Bilateral lens replacement. Otherwise the orbits are unremarkable. Other: None. CT CERVICAL SPINE FINDINGS Alignment: Normal. Skull base and vertebrae: Multilevel moderate severe degenerative changes of the spine with associated multilevel moderate osseous neural foraminal stenosis. No severe  osseous central canal stenosis. Multilevel cervical spine vertebral body height loss. No acute fracture. No aggressive appearing focal osseous lesion or focal pathologic process. Soft tissues and spinal canal: No prevertebral fluid or swelling. No visible canal hematoma. Upper chest: Stable 6 x 4 mm right apical pulmonary nodule-no further follow-up indicated. Other: None. IMPRESSION: 1. No acute intracranial abnormality. 2. No acute displaced fracture or traumatic listhesis of the cervical spine. Electronically Signed   By: Morgane  Naveau M.D.   On: 10/15/2023 18:42   DG Chest Portable 1 View Result Date: 10/15/2023 CLINICAL DATA:  Status post fall. EXAM: PORTABLE CHEST 1 VIEW COMPARISON:  June 10, 2022 FINDINGS: The heart size and mediastinal contours are within normal limits. Low lung volumes are noted. Both lungs are clear. Chronic and degenerative changes are seen involving both shoulders. Multilevel degenerative changes are also present throughout the thoracic spine. IMPRESSION: Low lung volumes without active cardiopulmonary disease. Electronically Signed   By: Suzen Dials M.D.   On: 10/15/2023 16:17   DG Pelvis 1-2 Views Result Date: 10/15/2023 CLINICAL DATA:  Status post fall. EXAM: PELVIS - 1-2 VIEW COMPARISON:  None Available. 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 There is no evidence of an acute fracture or dislocation. No pelvic bone lesions are seen. Degenerative changes are seen within the left hip and visualized portion of the lower lumbar spine. IMPRESSION: 1. Intact total right hip replacement. 2. No acute fracture or dislocation. Electronically Signed   By: Suzen Dials M.D.   On: 10/15/2023 16:15    Microbiology: Results for orders placed or performed during the hospital encounter of 10/15/23  Urine Culture (for pregnant, neutropenic or urologic patients or patients with an indwelling urinary catheter)     Status: Abnormal   Collection Time: 10/15/23   4:08 PM   Specimen: Urine, Clean Catch  Result Value Ref Range Status   Specimen Description   Final    URINE, CLEAN CATCH Performed at [facility], [address] [place], [place] [address]    Special Requests   Final    NONE Performed at [facility], [address] [place], [place] [address]    Culture >=100,000 COLONIES/mL ESCHERICHIA COLI (A)  Final   Report Status 10/18/2023 FINAL  Final   Organism ID, Bacteria ESCHERICHIA COLI (A)  Final      Susceptibility   Escherichia coli - MIC*    AMPICILLIN >=32 RESISTANT Resistant     CEFAZOLIN  (URINE) Value in next row Resistant      >=32 RESISTANTThis is a modified FDA-approved test that has been validated and its performance characteristics determined by the reporting laboratory.  This laboratory is certified under the Clinical Laboratory Improvement Amendments CLIA as qualified to perform high complexity clinical laboratory testing.    CEFEPIME Value in next row Sensitive      >=32 RESISTANTThis is a modified FDA-approved test that has been validated and its performance characteristics determined by the reporting laboratory.  This laboratory is certified under the Clinical Laboratory Improvement Amendments CLIA as qualified to perform high complexity clinical laboratory testing.    ERTAPENEM Value in next row Sensitive      >=32 RESISTANTThis is a modified FDA-approved test that has been validated and its performance characteristics determined by the reporting laboratory.  This laboratory is certified under the Clinical Laboratory Improvement Amendments CLIA as qualified to perform high complexity clinical laboratory testing.    CEFTRIAXONE  Value in next row Resistant      >=32 RESISTANTThis is a modified FDA-approved test that has been validated and its performance characteristics determined by the reporting laboratory.  This laboratory is certified under the Clinical Laboratory Improvement Amendments CLIA as  qualified to perform high complexity clinical laboratory testing.    CIPROFLOXACIN  Value in next row Sensitive      >=32 RESISTANTThis is a modified FDA-approved test that has been validated and its performance characteristics determined by the reporting laboratory.  This laboratory is certified under the Clinical Laboratory Improvement Amendments CLIA as qualified to perform high complexity clinical laboratory testing.    GENTAMICIN Value in next row Sensitive      >=32 RESISTANTThis is a modified FDA-approved test that has been validated and its performance characteristics determined by the reporting laboratory.  This laboratory is certified under the Clinical Laboratory Improvement Amendments CLIA as qualified to perform high complexity clinical laboratory testing.    NITROFURANTOIN  Value in next row Sensitive      >=32 RESISTANTThis is a modified FDA-approved test that has been validated and its performance characteristics determined by the reporting laboratory.  This laboratory is certified under the Clinical Laboratory Improvement Amendments CLIA as qualified to perform high complexity clinical laboratory testing.    TRIMETH/SULFA Value in next row Sensitive      >=32 RESISTANTThis is a modified FDA-approved test that has been validated and its performance characteristics determined by the reporting laboratory.  This laboratory is certified under the Clinical Laboratory Improvement Amendments CLIA as qualified to perform high complexity clinical laboratory testing.    AMPICILLIN/SULBACTAM Value in next row Intermediate      >=32 RESISTANTThis is a modified FDA-approved test that has been validated and its performance characteristics determined by the reporting laboratory.  This laboratory is certified under the Clinical Laboratory Improvement Amendments CLIA as qualified to perform high complexity clinical laboratory testing.    PIP/TAZO Value in next row Sensitive ug/mL     <=4 SENSITIVEThis is a  modified FDA-approved test that has been  validated and its performance characteristics determined by the reporting laboratory.  This laboratory is certified under the Clinical Laboratory Improvement Amendments CLIA as qualified to perform high complexity clinical laboratory testing.    MEROPENEM Value in next row Sensitive      <=4 SENSITIVEThis is a modified FDA-approved test that has been validated and its performance characteristics determined by the reporting laboratory.  This laboratory is certified under the Clinical Laboratory Improvement Amendments CLIA as qualified to perform high complexity clinical laboratory testing.    * >=100,000 COLONIES/mL ESCHERICHIA COLI    Labs: CBC: Recent Labs  Lab 10/16/23 0431 10/16/23 1329 10/17/23 0418 10/18/23 0157 10/19/23 0916 10/20/23 0515  WBC 6.4  --  6.5 7.3 5.0 6.2  HGB 6.9* 6.5* 8.8* 8.7* 10.1* 9.6*  HCT 22.4* 21.2* 27.5* 27.5* 32.2* 30.4*  MCV 71.8*  --  73.9* 74.1* 76.1* 76.0*  PLT 447*  --  419* 402* 395 399   Basic Metabolic Panel: Recent Labs  Lab 10/16/23 0431 10/17/23 0418 10/18/23 0157 10/19/23 0916 10/20/23 0515  NA 138 137 136 137 136  K 3.4* 3.8 4.0 4.4 4.3  CL 105 105 99 99 100  CO2 26 27 25 29 25   GLUCOSE 192* 201* 211* 141* 172*  BUN 10 8 9 12 14   CREATININE 0.88 1.02* 1.07* 0.85 0.95  CALCIUM 8.5* 8.6* 8.8* 9.0 8.6*  MG  --  1.9  --   --   --    Liver Function Tests: Recent Labs  Lab 10/15/23 1535  AST 18  ALT 12  ALKPHOS 63  BILITOT 0.4  PROT 6.5  ALBUMIN 3.2*   CBG: Recent Labs  Lab 10/19/23 1647 10/19/23 2043 10/20/23 0247 10/20/23 0734 10/20/23 1137  GLUCAP 117* 275* 177* 169* 322*    Discharge time spent: greater than 30 minutes.  Signed: Charlie Patterson, MD [facility] 10/20/2023

## 2023-10-20 NOTE — Progress Notes (Signed)
    Subjective  - POD #1, status post bilateral iliac stenting  Complaining of some left leg pain   Physical Exam:  Both feet are warm and well-perfused Has some calf tenderness however there is no evidence of compartment syndrome as all 4 compartments are very soft   Assessment/Plan:  POD #1  Patient's discomfort particular in her left leg is improved although not completely resolved.  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  I would like to see how she does initially after her iliac intervention.  She will need aspirin , statin, and Plavix  at discharge when medically ready.  Wells Sonnie Bias 10/20/2023 12:45 PM --  Vitals:   10/20/23 0737 10/20/23 0851  BP: (!) 141/49 (!) 123/47  Pulse: 77 64  Resp: 16   Temp: 98.5 F (36.9 C)   SpO2: 100% 97%    Intake/Output Summary (Last 24 hours) at 10/20/2023 1245 Last data filed at 10/20/2023 0900 Gross per 24 hour  Intake 1152.06 ml  Output 650 ml  Net 502.06 ml     Laboratory CBC    Component Value Date/Time   WBC 6.2 10/20/2023 0515   HGB 9.6 (L) 10/20/2023 0515   HGB 9.2 (L) 12/14/2022 1032   HCT 30.4 (L) 10/20/2023 0515   HCT 29.4 (L) 12/14/2022 1032   PLT 399 10/20/2023 0515   PLT 434 12/14/2022 1032    BMET    Component Value Date/Time   NA 136 10/20/2023 0515   NA 141 12/14/2022 1032   NA 141 04/09/2011 0323   K 4.3 10/20/2023 0515   K 4.1 04/09/2011 0323   CL 100 10/20/2023 0515   CL 106 04/09/2011 0323   CO2 25 10/20/2023 0515   CO2 26 04/09/2011 0323   GLUCOSE 172 (H) 10/20/2023 0515   GLUCOSE 210 (H) 04/09/2011 0323   BUN 14 10/20/2023 0515   BUN 20 12/14/2022 1032   BUN 16 04/09/2011 0323   CREATININE 0.95 10/20/2023 0515   CREATININE 0.96 04/09/2011 0323   CALCIUM 8.6 (L) 10/20/2023 0515   CALCIUM 8.2 (L) 04/09/2011 0323   GFRNONAA >60 10/20/2023 0515   GFRNONAA >60 04/09/2011 0323   GFRAA >60 06/16/2016 1504   GFRAA >60 04/09/2011 0323     COAG Lab Results  Component Value Date   INR 1.4 (H) 06/10/2022   INR 1.00 06/06/2016   INR 0.96 11/10/2015   No results found for: PTT  Antibiotics Anti-infectives (From admission, onward)    Start     Dose/Rate Route Frequency Ordered Stop   10/20/23 0000  nitrofurantoin , macrocrystal-monohydrate, (MACROBID ) 100 MG capsule        100 mg Oral Every 12 hours 10/20/23 1215 10/23/23 2359   10/18/23 2200  nitrofurantoin  (macrocrystal-monohydrate) (MACROBID ) capsule 100 mg        100 mg Oral Every 12 hours 10/18/23 1641 10/23/23 2159   10/18/23 1940  ceFAZolin  (ANCEF ) IVPB 2g/100 mL premix        2 g 200 mL/hr over 30 Minutes Intravenous 30 min pre-op 10/18/23 1940 10/19/23 1340   10/16/23 1000  cefTRIAXone  (ROCEPHIN ) 1 g in sodium chloride  0.9 % 100 mL IVPB  Status:  Discontinued        1 g 200 mL/hr over 30 Minutes Intravenous Every 24 hours 10/16/23 0839 10/17/23 1201        V. Malvina Serene CLORE, M.D., [facility] [place] Office: [phone] Pager:  [phone]

## 2023-10-20 NOTE — Care Management Important Message (Signed)
 Important Message  Patient Details  Name: Wanda Gardner MRN: [id number] Date of Birth: 1940-07-05   Important Message Given:  Yes - Medicare IM     [name] 10/20/2023, 1:57 PM

## 2023-10-23 ENCOUNTER — Telehealth: Payer: Self-pay

## 2023-10-23 ENCOUNTER — Encounter: Payer: Self-pay | Admitting: Vascular Surgery

## 2023-10-23 DIAGNOSIS — I70229 Atherosclerosis of native arteries of extremities with rest pain, unspecified extremity: Secondary | ICD-10-CM

## 2023-10-23 NOTE — Transitions of Care (Post Inpatient/ED Visit) (Signed)
   10/23/2023  Name: MEA OZGA MRN: [id number] DOB: 05-Mar-1940  Today's TOC FU Call Status: Today's TOC FU Call Status:: Unsuccessful Call (1st Attempt)  Attempted to reach the patient regarding the most recent Inpatient/ED visit.  Follow Up Plan: Additional outreach attempts will be made to reach the patient to complete the Transitions of Care (Post Inpatient/ED visit) call.   Medford Balboa, BSN, RN [place]  VBCI - [employer] [facility] RN Care Manager [phone]

## 2023-10-24 ENCOUNTER — Telehealth: Payer: Self-pay

## 2023-10-24 DIAGNOSIS — E1165 Type 2 diabetes mellitus with hyperglycemia: Secondary | ICD-10-CM

## 2023-10-24 NOTE — Transitions of Care (Post Inpatient/ED Visit) (Signed)
 10/24/2023  Name: Wanda Gardner MRN: [id number] DOB: [birth year]  Today's TOC FU Call Status: Today's TOC FU Call Status:: Successful TOC FU Call Completed TOC FU Call Complete Date: 10/24/23 Patient's Name and Date of Birth confirmed.  Transition Care Management Follow-up Telephone Call Date of Discharge: 10/20/23 Discharge Facility: [facility]) Type of Discharge: Inpatient Admission Primary Inpatient Discharge Diagnosis:: Bilateral Iliac stent How have you been since you were released from the hospital?: Better Any questions or concerns?: No  Items Reviewed: Did you receive and understand the discharge instructions provided?: Yes Medications obtained,verified, and reconciled?: Yes (Medications Reviewed) (The patient is continuing to take two medications that were discontinued) Any new allergies since your discharge?: No Dietary orders reviewed?: Yes Type of Diet Ordered:: Low sodium heart healthy Do you have support at home?: Yes People in Home [RPT]: child(ren), adult Name of Support/Comfort Primary Source: Logan Cork  Medications Reviewed Today: Medications Reviewed Today     Reviewed by Moises Reusing, RN (Case Manager) on 10/24/23 at 1152  Med List Status: <None>   Medication Order Taking? Sig Documenting Provider Last Dose Status Informant  Accu-Chek Softclix Lancets lancets [id number]  Use with device to check sugars twice daily Orlean Alan HERO, FNP  Active Family Member, Pharmacy Records  albuterol  (VENTOLIN  HFA) 108 [phone] Base) MCG/ACT inhaler [id number]  Inhale 2 puffs into the lungs every 6 (six) hours as needed for wheezing or shortness of breath. Orlean Alan HERO, FNP  Active Family Member, Pharmacy Records  apixaban  (ELIQUIS ) 5 MG TABS tablet [id number]  Take 1 tablet (5 mg total) by mouth 2 (two) times daily. Josette Ade, MD  Active   aspirin  EC 81 MG tablet [id number]  Take 1 tablet (81 mg total) by mouth daily. Swallow whole.  Josette Ade, MD  Active   betamethasone , augmented, (DIPROLENE ) 0.05 % lotion [id number]  Apply 1 Application topically 2 (two) times daily. [provider]  Active Family Member, Pharmacy Records  Cholecalciferol  (VITAMIN D3) 125 MCG (5000 UT) CAPS [id number]  Take 1 capsule (5,000 Units total) by mouth daily. Orlean Alan HERO, FNP  Active Family Member, Pharmacy Records  ciprofloxacin  (CILOXAN ) 0.3 % ophthalmic solution [id number]  Place 2 drops into the right eye every 4 (four) hours while awake for 5 days. Administer 1 drop, every 2 hours, while awake, for 2 days. Then 1 drop, every 4 hours, while awake, for the next 5 days. Josette Ade, MD  Active   diclofenac  sodium (VOLTAREN ) 1 % GEL [id number]  Apply 2 g topically 2 (two) times daily as needed (pain).  [provider]  Active Pharmacy Records, Family Member  docusate sodium  (COLACE) 100 MG capsule [id number]  Take 1 capsule (100 mg total) by mouth 2 (two) times daily. Viviann Pastor, MD  Active Pharmacy Records, Family Member  dorzolamide -timolol  (COSOPT ) 2-0.5 % ophthalmic solution [id number]  Place 1 drop into both eyes 2 (two) times daily. [provider]  Active Family Member, Pharmacy Records  doxepin  (SINEQUAN ) 10 MG capsule [id number]  TAKE 1 CAPSULE BY MOUTH AT BEDTIME Orlean Alan HERO, FNP  Active Family Member, Pharmacy Records  DULoxetine  (CYMBALTA ) 60 MG capsule [id number]  TAKE 1 CAPSULE BY MOUTH DAILY Orlean Alan HERO, FNP  Active Family Member, Pharmacy Records  esomeprazole  (NEXIUM ) 40 MG capsule [id number]  Take 1 capsule (40 mg total) by mouth daily. Orlean Alan HERO, FNP  Active Family Member, Pharmacy Records  ferrous sulfate  325 [phone] FE) MG tablet [id number]  Take 325  mg by mouth daily with breakfast. [provider]  Active Pharmacy Records, Family Member           Med Note CATHY OVAL DEL   Dju Jun 10, 2022  3:10 PM)    fluticasone  (FLONASE ) 50 MCG/ACT nasal spray [id number]   Place 2 sprays into both nostrils daily as needed for allergies. Orlean Alan HERO, FNP  Active Family Member, Pharmacy Records  furosemide  (LASIX ) 20 MG tablet [id number]  TAKE 1 TABLET BY MOUTH DAILY Orlean Alan HERO, FNP  Active Family Member, Pharmacy Records  glucose blood (ACCU-CHEK GUIDE TEST) test strip [id number]  CHECK BLOOD SUGAR TWICE DAILY Orlean Alan HERO, FNP  Active Family Member, Pharmacy Records  hydrOXYzine  (ATARAX ) 25 MG tablet [id number]  Take 25 mg by mouth at bedtime as needed (insomnia). [provider]  Active Pharmacy Records, Family Member  insulin  glargine (LANTUS  SOLOSTAR) 100 UNIT/ML Solostar Pen [id number]  Inject 9 Units into the skin at bedtime. Josette Ade, MD  Active   insulin  lispro (HUMALOG ) 100 UNIT/ML KwikPen [id number]  Inject 2 Units into the skin 3 (three) times daily with meals. If eating and Blood Glucose (BG) 80 or higher inject 2 units for meal coverage and add correction dose per scale. If not eating, correction dose only. BG <150= 0 unit; BG 150-200= 1 unit; BG 201-250= 2 unit; BG 251-300= 3 unit; BG 301-350= 4 unit; BG 351-400= 5 unit; BG >400= 6 unit and Call Primary Care.  Patient not taking: Reported on 10/24/2023   Josette Ade, MD  Active   Insulin  Pen Needle (BD PEN NEEDLE NANO U/F) 32G X 4 MM MISC [id number]  1 each by Does not apply route in the morning, at noon, in the evening, and at bedtime. Orlean Alan HERO, FNP  Active Family Member, Pharmacy Records  lactulose  (CHRONULAC ) 10 GM/15ML solution [id number]  Take 15 mLs (10 g total) by mouth 2 (two) times daily as needed for mild constipation, moderate constipation or severe constipation. Orlean Alan HERO, FNP  Active Family Member, Pharmacy Records  losartan  (COZAAR ) 50 MG tablet [id number]  TAKE 1 TABLET BY MOUTH DAILY Orlean Alan HERO, FNP  Active Family Member, Pharmacy Records  oxyCODONE  (OXY IR/ROXICODONE ) 5 MG immediate release tablet [id number]  Take 1 tablet (5 mg total) by  mouth every 6 (six) hours as needed for severe pain (pain score 7-10). Josette Ade, MD  Active   polyethylene glycol (MIRALAX  / GLYCOLAX ) 17 g packet [id number]  Take 17 g by mouth daily. Josette Ade, MD  Active   potassium chloride  SA (K-DUR,KLOR-CON ) 20 MEQ tablet [id number]  Take 20 mEq by mouth daily. [provider]  Active Pharmacy Records, Family Member  pravastatin  (PRAVACHOL ) 40 MG tablet [id number]  TAKE 1 TABLET BY MOUTH DAILY Orlean Alan HERO, FNP  Active Family Member, Pharmacy Records  vitamin B-12 (CYANOCOBALAMIN ) 1000 MCG tablet [id number]  Take 1,000 mcg by mouth daily. [provider]  Active Pharmacy Records, Family Member           Med Note WORLEY, [place] S   Fri Jun 16, 2016  4:43 PM)              Home Care and Equipment/Supplies: Were Home Health Services Ordered?: Yes Name of Home Health Agency:: [facility] Has Agency set up a time to come to your home?: No EMR reviewed for Home Health Orders: Orders present/patient has not received call (refer to CM for follow-up) (The home health company has called but she  has not called back) Any new equipment or medical supplies ordered?: Yes Name of Medical supply agency?: [facility] Were you able to get the equipment/medical supplies?: Yes Do you have any questions related to the use of the equipment/supplies?: No  Functional Questionnaire: Do you need assistance with bathing/showering or dressing?: No Do you need assistance with meal preparation?: No Do you need assistance with eating?: No Do you have difficulty maintaining continence: No Do you need assistance with getting out of bed/getting out of a chair/moving?: No Do you have difficulty managing or taking your medications?: No  Follow up appointments reviewed: PCP Follow-up appointment confirmed?: No (The patient will call [facility]. Care Guides cannot schedule) MD Provider Line Number:[phone] Given: No Specialist Hospital Follow-up  appointment confirmed?: No Reason Specialist Follow-Up Not Confirmed: Patient has Specialist Provider Number and will Call for Appointment Do you need transportation to your follow-up appointment?: No Do you understand care options if your condition(s) worsen?: Yes-patient verbalized understanding  SDOH Interventions Today    Flowsheet Row Most Recent Value  SDOH Interventions   Food Insecurity Interventions Intervention Not Indicated  Housing Interventions Intervention Not Indicated  Transportation Interventions Intervention Not Indicated  Utilities Interventions AMB Referral    Goals Addressed             This Visit's Progress    VBCI Transitions of Care (TOC) Care Plan       Problems:  Recent Hospitalization for treatment of CAD Medication management barrier The patient did not understand her discharge medication instructions, No Hospital Follow Up Provider appointment The patient states she will call today. She has to arrange her appointments through her sister because she does not drive, and SDOH barrier: Academic librarian and Utility Needs  Goal:  Over the next 30 days, the patient will not experience hospital readmission  Interventions:   CAD Interventions: Assessed understanding of CAD diagnosis Medications reviewed including medications utilized in CAD treatment plan Provided education on Importance of limiting foods high in cholesterol Reviewed Importance of taking all medications as prescribed Reviewed Importance of attending all scheduled provider appointments Advised patient to discuss Medications with provider Contact [facility] for HHPT and SN services. The agency has called and the patient has ignored the humber  Patient Self Care Activities:  Attend all scheduled provider appointments Call pharmacy for medication refills 3-7 days in advance of running out of medications Call provider office for new concerns or questions  Notify RN Care Manager of  [facility] call rescheduling needs Participate in Transition of Care Program/Attend [facility] scheduled calls Perform all self care activities independently  Take medications as prescribed   Work with the social worker to address care coordination needs and will continue to work with the clinical team to address health care and disease management related needs  Plan:  Telephone follow up appointment with care management team member scheduled for:  Monday September 8th at 12:00pm        Medford Balboa, BSN, RN [place]  VBCI - [facility] RN Care Manager [phone]

## 2023-10-24 NOTE — Patient Instructions (Signed)
 Visit Information  Thank you for taking time to visit with me today. Please don't hesitate to contact me if I can be of assistance to you before our next scheduled telephone appointment.  Our next appointment is by telephone on Monday September 8th at 12pm  Following is a copy of your care plan:   Goals Addressed             This Visit's Progress    VBCI Transitions of Care (TOC) Care Plan       Problems:  Recent Hospitalization for treatment of CAD Medication management barrier The patient did not understand her discharge medication instructions, No Hospital Follow Up Provider appointment The patient states she will call today. She has to arrange her appointments through her sister because she does not drive, and SDOH barrier: Academic librarian and Utility Needs  Goal:  Over the next 30 days, the patient will not experience hospital readmission  Interventions:   CAD Interventions: Assessed understanding of CAD diagnosis Medications reviewed including medications utilized in CAD treatment plan Provided education on Importance of limiting foods high in cholesterol Reviewed Importance of taking all medications as prescribed Reviewed Importance of attending all scheduled provider appointments Advised patient to discuss Medications with provider Contact [facility] for HHPT and SN services. The agency has called and the patient has ignored the humber  Patient Self Care Activities:  Attend all scheduled provider appointments Call pharmacy for medication refills 3-7 days in advance of running out of medications Call provider office for new concerns or questions  Notify RN Care Manager of [facility] call rescheduling needs Participate in Transition of Care Program/Attend [facility] scheduled calls Perform all self care activities independently  Take medications as prescribed   Work with the social worker to address care coordination needs and will continue to work with the clinical team  to address health care and disease management related needs  Plan:  Telephone follow up appointment with care management team member scheduled for:  Monday September 8th at 12:00pm        The patient has been provided with contact information for the care management team and has been advised to call with any health related questions or concerns.   Please call the care guide team at [phone] if you need to cancel or reschedule your appointment.   Please call the Suicide and Crisis Lifeline: 988 call the [place]  National Suicide Prevention Lifeline: 641-194-8668 or TTY: 949-400-2222 TTY 8385672976) to talk to a trained counselor if you are experiencing a Mental Health or Behavioral Health Crisis or need someone to talk to.  Medford Balboa, BSN, RN [place]  VBCI - [employer] [facility] RN Care Manager [phone]

## 2023-10-25 ENCOUNTER — Telehealth (INDEPENDENT_AMBULATORY_CARE_PROVIDER_SITE_OTHER): Payer: Self-pay

## 2023-10-25 NOTE — Telephone Encounter (Signed)
 I attempted to contact the patient to reschedule her from 10/30/23 to 10/26/23. I was unable to leave a message as the patient's voicemail box is full. I called then attempted to contact the patient's sister and spoke with her and requested to have her sister contact me at the information I gave.

## 2023-10-26 ENCOUNTER — Telehealth: Payer: Self-pay | Admitting: *Deleted

## 2023-10-26 NOTE — Progress Notes (Signed)
 Complex Care Management Note Care Guide Note  10/26/2023 Name: CIERRA ROTHGEB MRN: [id number] DOB: Dec 09, 1940   Complex Care Management Outreach Attempts: An unsuccessful telephone outreach was attempted today to offer the patient information about available complex care management services.  Follow Up Plan:  Additional outreach attempts will be made to offer the patient complex care management information and services.   Encounter Outcome:  No Answer  Asencion Gell  [facility] HealthPopulation Health Care Guide  Direct Dial :[phone] Fax:[phone] Website: [email or web address]

## 2023-10-29 ENCOUNTER — Other Ambulatory Visit: Payer: Self-pay

## 2023-10-29 ENCOUNTER — Telehealth: Payer: Self-pay | Admitting: *Deleted

## 2023-10-29 NOTE — Patient Instructions (Signed)
 Visit Information  Thank you for taking time to visit with me today. Please don't hesitate to contact me if I can be of assistance to you before our next scheduled telephone appointment.  Our next appointment is by telephone on Monday September 15th at 12pm  Following is a copy of your care plan:   Goals Addressed             This Visit's Progress    VBCI Transitions of Care (TOC) Care Plan       Problems: (reviewed 10/29/23) Recent Hospitalization for treatment of CAD Medication management barrier The patient did not understand her discharge medication instructions, No Hospital Follow Up Provider appointment The patient states she will call today. She has to arrange her appointments through her sister because she does not drive, and SDOH barrier: Academic librarian and Utility Needs 10/29/23 - Encouraged the patient tos schedule with PCP and Vascular surgeon  Goal: (reviewed 10/29/23) Over the next 30 days, the patient will not experience hospital readmission  Interventions: (reviewed 10/29/23)  CAD Interventions: Assessed understanding of CAD diagnosis Medications reviewed including medications utilized in CAD treatment plan Provided education on Importance of limiting foods high in cholesterol Reviewed Importance of taking all medications as prescribed Reviewed Importance of attending all scheduled provider appointments Advised patient to discuss Medications with provider Contact [facility] for HHPT and SN services. The agency has called and the patient has ignored the humber - 10/29/23 - The patient has not been compliant with calling back to Home Health  Patient Self Care Activities: (reviewed 10/29/23) Attend all scheduled provider appointments Call pharmacy for medication refills 3-7 days in advance of running out of medications Call provider office for new concerns or questions  Notify RN Care Manager of [name] call rescheduling needs Participate in Transition of Care  Program/Attend [facility] scheduled calls Perform all self care activities independently  Take medications as prescribed   Work with the social worker to address care coordination needs and will continue to work with the clinical team to address health care and disease management related needs  Plan:  Telephone follow up appointment with care management team member scheduled for:  Monday September 15th at 12:00pm        The patient has been provided with contact information for the care management team and has been advised to call with any health related questions or concerns.   Please call the care guide team at [phone] if you need to cancel or reschedule your appointment.   Please call the Suicide and Crisis Lifeline: 988 call the [place]  National Suicide Prevention Lifeline: 450 828 1603 or TTY: 413-109-2938 TTY 219-714-9296) to talk to a trained counselor if you are experiencing a Mental Health or Behavioral Health Crisis or need someone to talk to.  Medford Balboa, BSN, RN [place]  VBCI - [employer] [facility] RN Care Manager [phone]

## 2023-10-29 NOTE — Progress Notes (Signed)
 Complex Care Management Note Care Guide Note  10/29/2023 Name: Wanda Gardner MRN: [id number] DOB: Oct 09, 1940  [name] is a 83 y.o. year old female who is a primary care patient of Orlean Alan HERO, FNP . The community resource team was consulted for assistance with Transportation Needs , Food Insecurity, and Financial Difficulties related to utilities  SDOH screenings and interventions completed:  Yes  Social Drivers of Health From This Encounter   Financial Resource Strain: High Risk (10/29/2023)   Overall Financial Resource Strain (CARDIA)    Difficulty of Paying Living Expenses: Hard    SDOH Interventions Today    Flowsheet Row Most Recent Value  SDOH Interventions   Utilities Interventions Community Resources Provided  [Not up to date on utilities , provided limited resources available in there are]  Financial Strain Interventions Community Resources Provided  [employer] provided food and utility commision]  Social Connections Interventions Community Resources Provided     Care guide performed the following interventions: Patient provided with information about care guide support team and interviewed to confirm resource needs.  Follow Up Plan:  No further follow up planned at this time. The patient has been provided with needed resources.  Encounter Outcome:  Patient Visit Completed Chanan Detwiler [place]  [facility] HealthPopulation Health Care Guide  Direct Dial [phone] [phone] Website: [email or web address]

## 2023-10-29 NOTE — Transitions of Care (Post Inpatient/ED Visit) (Signed)
 Transition of Care week 2  Visit Note  10/29/2023  Name: Wanda Gardner MRN: [id number]          DOB: [birth year]  Situation: Patient enrolled in [facility] 30-day program. Visit completed with Winton Berkshire by telephone.   Background:   Past Medical History:  Diagnosis Date   Acid reflux    AKI (acute kidney injury) (HCC) 06/10/2022   Arthritis    Asthma    Chest pain, unspecified 10/30/2013   Formatting of this note might be different from the original. Note: Unchanged Note: Unchanged Formatting of this note might be different from the original. Note: Unchanged Note: Unchanged   Constipation 04/20/2011   Formatting of this note might be different from the original. Note: Unchanged - probably [facility]. Note: Unchanged - probably [facility].   COPD (chronic obstructive pulmonary disease) (HCC)    Cough 10/30/2013   Formatting of this note might be different from the original. Note: Unchanged Note: Unchanged   Depression    Diabetes mellitus (HCC) 01/10/2022   Diabetes mellitus without complication (HCC)    Dyspnea    Glaucoma    Headache    right side of head   Headache 07/25/2011   Hyperlipidemia    Hypertension    Knee stiff 05/12/2022   Lens replaced 03/22/2010   Neuropathic pain of hands and feet    PONV (postoperative nausea and vomiting) 1982   back with my hysterectomy.   Pseudophakia 03/22/2010   Sleep apnea    Doesn't use CPAP   Stiffness of shoulder joint 05/12/2022   Trigger finger of right hand 01/10/2022   Wears dentures    full upper and lower    Assessment: Patient Reported Symptoms: Cognitive Cognitive Status: Alert and oriented to person, place, and time, Requires Assistance Decision Making, Struggling with memory recall      Neurological Neurological Review of Symptoms: No symptoms reported    HEENT HEENT Symptoms Reported: Eye pain HEENT Management Strategies: Coping strategies HEENT Comment: The patient states she has been unable to contact her eye doctor for  an appointment    Cardiovascular Cardiovascular Symptoms Reported: No symptoms reported Does patient have uncontrolled Hypertension?: No Cardiovascular Management Strategies: Medication therapy, Routine screening, Adequate rest Cardiovascular Comment: The patient has some coldness and pain in her left left down to her big toe. CM provided the phone number to her Vascular surgeon so she can call for a follow up appointment  Respiratory Respiratory Symptoms Reported: No symptoms reported    Endocrine Endocrine Symptoms Reported: No symptoms reported Is patient diabetic?: Yes Is patient checking blood sugars at home?: Yes List most recent blood sugar readings, include date and time of day: 151 9/7 Endocrine Comment: The patient takes 2 units of short acting insulin  when she eats a meal. She continues to take 9 units of long acting insulin   Gastrointestinal Gastrointestinal Symptoms Reported: Constipation Gastrointestinal Management Strategies: Medication therapy, Incontinence garment/pad, Coping strategies    Genitourinary Genitourinary Symptoms Reported: Incontinence Genitourinary Management Strategies: Incontinence garment/pad  Integumentary Integumentary Symptoms Reported: No symptoms reported    Musculoskeletal Musculoskelatal Symptoms Reviewed: Limited mobility, Weakness Additional Musculoskeletal Details: Pain to left lower extremity including her left great toe Musculoskeletal Management Strategies: Medication therapy, Routine screening, Activity, Adequate rest Musculoskeletal Comment: HHPT has not been able to contact the patient. She does not remember to return calls      Psychosocial Psychosocial Symptoms Reported: No symptoms reported         There were no vitals  filed for this visit.  Medications Reviewed Today     Reviewed by Moises Reusing, RN (Case Manager) on 10/29/23 at 1218  Med List Status: <None>   Medication Order Taking? Sig Documenting Provider Last Dose  Status Informant  Accu-Chek Softclix Lancets lancets [id number]  Use with device to check sugars twice daily Orlean Alan HERO, FNP  Active Family Member, Pharmacy Records  albuterol  (VENTOLIN  HFA) 108 [phone] Base) MCG/ACT inhaler [id number]  Inhale 2 puffs into the lungs every 6 (six) hours as needed for wheezing or shortness of breath. Orlean Alan HERO, FNP  Active Family Member, Pharmacy Records  apixaban  (ELIQUIS ) 5 MG TABS tablet [id number]  Take 1 tablet (5 mg total) by mouth 2 (two) times daily. Josette Ade, MD  Active   aspirin  EC 81 MG tablet [id number]  Take 1 tablet (81 mg total) by mouth daily. Swallow whole. Josette Ade, MD  Active   betamethasone , augmented, (DIPROLENE ) 0.05 % lotion [id number]  Apply 1 Application topically 2 (two) times daily. [provider]  Active Family Member, Pharmacy Records  Cholecalciferol  (VITAMIN D3) 125 MCG (5000 UT) CAPS [id number]  Take 1 capsule (5,000 Units total) by mouth daily. Orlean Alan HERO, FNP  Active Family Member, Pharmacy Records  diclofenac  sodium (VOLTAREN ) 1 % GEL [id number]  Apply 2 g topically 2 (two) times daily as needed (pain).  [provider]  Active Pharmacy Records, Family Member  docusate sodium  (COLACE) 100 MG capsule [id number]  Take 1 capsule (100 mg total) by mouth 2 (two) times daily. Viviann Pastor, MD  Active Pharmacy Records, Family Member  dorzolamide -timolol  (COSOPT ) 2-0.5 % ophthalmic solution [id number]  Place 1 drop into both eyes 2 (two) times daily. [provider]  Active Family Member, Pharmacy Records  doxepin  (SINEQUAN ) 10 MG capsule [id number]  TAKE 1 CAPSULE BY MOUTH AT BEDTIME Orlean Alan HERO, FNP  Active Family Member, Pharmacy Records  DULoxetine  (CYMBALTA ) 60 MG capsule [id number]  TAKE 1 CAPSULE BY MOUTH DAILY Orlean Alan HERO, FNP  Active Family Member, Pharmacy Records  esomeprazole  (NEXIUM ) 40 MG capsule [id number]  Take 1 capsule (40 mg total) by mouth daily. Orlean Alan HERO, FNP  Active Family Member, Pharmacy Records  ferrous sulfate  325 [phone] FE) MG tablet [id number]  Take 325 mg by mouth daily with breakfast. [provider]  Active Pharmacy Records, Family Member           Med Note CATHY OVAL DEL   Dju Jun 10, 2022  3:10 PM)    fluticasone  (FLONASE ) 50 MCG/ACT nasal spray [id number]  Place 2 sprays into both nostrils daily as needed for allergies. Orlean Alan HERO, FNP  Active Family Member, Pharmacy Records  furosemide  (LASIX ) 20 MG tablet [id number]  TAKE 1 TABLET BY MOUTH DAILY Orlean Alan HERO, FNP  Active Family Member, Pharmacy Records  glucose blood (ACCU-CHEK GUIDE TEST) test strip [id number]  CHECK BLOOD SUGAR TWICE DAILY Orlean Alan HERO, FNP  Active Family Member, Pharmacy Records  hydrOXYzine  (ATARAX ) 25 MG tablet [id number]  Take 25 mg by mouth at bedtime as needed (insomnia). [provider]  Active Pharmacy Records, Family Member  insulin  glargine (LANTUS  SOLOSTAR) 100 UNIT/ML Solostar Pen [id number]  Inject 9 Units into the skin at bedtime. Josette Ade, MD  Active   insulin  lispro (HUMALOG ) 100 UNIT/ML KwikPen [id number] Yes Inject 2 Units into the skin 3 (three) times daily with meals. If eating and Blood Glucose (BG) 80 or higher inject 2 units for meal coverage and add  correction dose per scale. If not eating, correction dose only. BG <150= 0 unit; BG 150-200= 1 unit; BG 201-250= 2 unit; BG 251-300= 3 unit; BG 301-350= 4 unit; BG 351-400= 5 unit; BG >400= 6 unit and Call Primary Care. Josette Ade, MD  Active   Insulin  Pen Needle (BD PEN NEEDLE NANO U/F) 32G X 4 MM MISC [id number]  1 each by Does not apply route in the morning, at noon, in the evening, and at bedtime. Orlean Alan HERO, FNP  Active Family Member, Pharmacy Records  lactulose  (CHRONULAC ) 10 GM/15ML solution [id number]  Take 15 mLs (10 g total) by mouth 2 (two) times daily as needed for mild constipation, moderate constipation or severe constipation.  Orlean Alan HERO, FNP  Active Family Member, Pharmacy Records  losartan  (COZAAR ) 50 MG tablet [id number]  TAKE 1 TABLET BY MOUTH DAILY Orlean Alan HERO, FNP  Active Family Member, Pharmacy Records  oxyCODONE  (OXY IR/ROXICODONE ) 5 MG immediate release tablet [id number]  Take 1 tablet (5 mg total) by mouth every 6 (six) hours as needed for severe pain (pain score 7-10). Josette Ade, MD  Active   polyethylene glycol (MIRALAX  / GLYCOLAX ) 17 g packet [id number]  Take 17 g by mouth daily. Josette Ade, MD  Active   potassium chloride  SA (K-DUR,KLOR-CON ) 20 MEQ tablet [id number]  Take 20 mEq by mouth daily. [provider]  Active Pharmacy Records, Family Member  pravastatin  (PRAVACHOL ) 40 MG tablet [id number]  TAKE 1 TABLET BY MOUTH DAILY Orlean Alan HERO, FNP  Active Family Member, Pharmacy Records  vitamin B-12 (CYANOCOBALAMIN ) 1000 MCG tablet [id number]  Take 1,000 mcg by mouth daily. [provider]  Active Pharmacy Records, Family Member           Med Note WORLEY, ZAKIYA S   Fri Jun 16, 2016  4:43 PM)              Recommendation:   PCP Follow-up Specialty provider follow-up Dr. Cordella Shawl, Vascular Surgeon Continue Current Plan of Care  Follow Up Plan:   Telephone follow-up in 1 week  Medford Balboa, BSN, RN [place]  VBCI - [facility] RN Care Manager [phone]

## 2023-10-30 ENCOUNTER — Telehealth: Payer: Self-pay

## 2023-10-30 ENCOUNTER — Ambulatory Visit: Admission: RE | Admit: 2023-10-30 | Source: Home / Self Care | Admitting: Vascular Surgery

## 2023-10-30 ENCOUNTER — Encounter: Admission: RE | Payer: Self-pay | Source: Home / Self Care

## 2023-10-30 DIAGNOSIS — I70229 Atherosclerosis of native arteries of extremities with rest pain, unspecified extremity: Secondary | ICD-10-CM

## 2023-10-30 SURGERY — LOWER EXTREMITY INTERVENTION
Anesthesia: Moderate Sedation | Site: Leg Lower | Laterality: Right

## 2023-10-30 NOTE — Progress Notes (Signed)
   10/30/2023  Patient ID: [name], female   DOB: November 12, 1940, 83 y.o.   MRN: [id number]  Attempted to contact patient to schedule appointment for medication management. Voicemail full, unable to leave message. No active mychart. Will continue to attempt to reach.  Jon VEAR Lindau, PharmD Clinical Pharmacist [phone]

## 2023-11-05 ENCOUNTER — Telehealth: Payer: Self-pay

## 2023-11-06 ENCOUNTER — Other Ambulatory Visit: Payer: Self-pay

## 2023-11-06 DIAGNOSIS — N3942 Incontinence without sensory awareness: Secondary | ICD-10-CM | POA: Diagnosis not present

## 2023-11-06 DIAGNOSIS — E1165 Type 2 diabetes mellitus with hyperglycemia: Secondary | ICD-10-CM

## 2023-11-06 DIAGNOSIS — N3281 Overactive bladder: Secondary | ICD-10-CM | POA: Diagnosis not present

## 2023-11-06 NOTE — Patient Instructions (Signed)
 Visit Information  Thank you for taking time to visit with me today. Please don't hesitate to contact me if I can be of assistance to you before our next scheduled telephone appointment.  Our next appointment is by telephone on Wednesday September 24th at 1:00pm  Following is a copy of your care plan:   Goals Addressed             This Visit's Progress    VBCI Transitions of Care (TOC) Care Plan       Problems: (reviewed 11/06/23) Recent Hospitalization for treatment of CAD Medication management barrier The patient did not understand her discharge medication instructions, No Hospital Follow Up Provider appointment The patient states she will call today. She has to arrange her appointments through her sister because she does not drive, and SDOH barrier: Academic librarian and Utility Needs 10/29/23 - Encouraged the patient to schedule with PCP and Vascular surgeon 11/06/23 - The patient has not followed up with scheduling vascular appointment. She states she has an appointment with PCP on 11/13/23  Goal: (reviewed 11/06/23) Over the next 30 days, the patient will not experience hospital readmission  Interventions: (reviewed 11/06/23)  CAD Interventions: Assessed understanding of CAD diagnosis Medications reviewed including medications utilized in CAD treatment plan Provided education on Importance of limiting foods high in cholesterol Reviewed Importance of taking all medications as prescribed Reviewed Importance of attending all scheduled provider appointments Advised patient to discuss Medications with provider Contact [facility] for HHPT and SN services. The agency has called and the patient has ignored the humber - 10/29/23 - The patient has not been compliant with calling back to Home Health 11/06/23 - Unclear on [facility] Referral. Follow up with [place] at [facility] for clarification  Patient Self Care Activities: (reviewed 11/06/23) Attend all scheduled provider appointments Call  pharmacy for medication refills 3-7 days in advance of running out of medications Call provider office for new concerns or questions  Notify RN Care Manager of [facility] call rescheduling needs Participate in Transition of Care Program/Attend [facility] scheduled calls Perform all self care activities independently  Take medications as prescribed   Work with the social worker to address care coordination needs and will continue to work with the clinical team to address health care and disease management related needs  Plan:  Telephone follow up appointment with care management team member scheduled for:  Wednesday September 24th at 1:00pm         The patient has been provided with contact information for the care management team and has been advised to call with any health related questions or concerns.   Please call the care guide team at [phone] if you need to cancel or reschedule your appointment.   Please call the Suicide and Crisis Lifeline: 988 call the [place]  National Suicide Prevention Lifeline: 9295394602 or TTY: 416-696-9901 TTY 719 720 7755) to talk to a trained counselor if you are experiencing a Mental Health or Behavioral Health Crisis or need someone to talk to.  Medford Balboa, BSN, RN [place]  VBCI - [facility] RN Care Manager [phone]

## 2023-11-06 NOTE — Transitions of Care (Post Inpatient/ED Visit) (Signed)
 Transition of Care week 3  Visit Note  11/06/2023  Name: Wanda Gardner MRN: [id number]          DOB: 1940-11-26  Situation: Patient enrolled in [facility] 30-day program. Visit completed with Winton Berkshire by telephone.   Background:   Past Medical History:  Diagnosis Date   Acid reflux    AKI (acute kidney injury) (HCC) 06/10/2022   Arthritis    Asthma    Chest pain, unspecified 10/30/2013   Formatting of this note might be different from the original. Note: Unchanged Note: Unchanged Formatting of this note might be different from the original. Note: Unchanged Note: Unchanged   Constipation 04/20/2011   Formatting of this note might be different from the original. Note: Unchanged - probably [facility]. Note: Unchanged - probably [facility].   COPD (chronic obstructive pulmonary disease) (HCC)    Cough 10/30/2013   Formatting of this note might be different from the original. Note: Unchanged Note: Unchanged   Depression    Diabetes mellitus (HCC) 01/10/2022   Diabetes mellitus without complication (HCC)    Dyspnea    Glaucoma    Headache    right side of head   Headache 07/25/2011   Hyperlipidemia    Hypertension    Knee stiff 05/12/2022   Lens replaced 03/22/2010   Neuropathic pain of hands and feet    PONV (postoperative nausea and vomiting) 1982   back with my hysterectomy.   Pseudophakia 03/22/2010   Sleep apnea    Doesn't use CPAP   Stiffness of shoulder joint 05/12/2022   Trigger finger of right hand 01/10/2022   Wears dentures    full upper and lower    Assessment: Patient Reported Symptoms: Cognitive Cognitive Status: Alert and oriented to person, place, and time, Struggling with memory recall, Requires Assistance Decision Making      Neurological Neurological Review of Symptoms: No symptoms reported    HEENT HEENT Symptoms Reported: Eye pain HEENT Management Strategies: Routine screening HEENT Comment: She contacted the eye doctor and is waiting for a return phone  call    Cardiovascular Cardiovascular Symptoms Reported: No symptoms reported Does patient have uncontrolled Hypertension?: No Cardiovascular Management Strategies: Medication therapy, Routine screening, Adequate rest Cardiovascular Comment: The patient has not contacted the Vascular surgeon. She has some burning pain in her left foot. Provided the number again today for the patient to call for a follow up appointment  Respiratory Respiratory Symptoms Reported: No symptoms reported    Endocrine Endocrine Symptoms Reported: Weakness or fatigue Is patient diabetic?: Yes Is patient checking blood sugars at home?: Yes List most recent blood sugar readings, include date and time of day: 9/16  States she has not taken it today and she does not remember what it was yesterday. Endocrine Comment: Takes her Long acting insulin  and takes her 2 units before meals. She eats once or twice a day  Gastrointestinal Gastrointestinal Symptoms Reported: Constipation Gastrointestinal Management Strategies: Medication therapy, Incontinence garment/pad, Coping strategies Gastrointestinal Comment: The patient continues with constipation. She stated she had a small BM last night. Discussed increasing Miralax  to twice daily    Genitourinary Genitourinary Symptoms Reported: Incontinence Genitourinary Management Strategies: Incontinence garment/pad  Integumentary Integumentary Symptoms Reported: No symptoms reported    Musculoskeletal Musculoskelatal Symptoms Reviewed: Weakness, Limited mobility Musculoskeletal Management Strategies: Medication therapy, Routine screening, Activity, Adequate rest Musculoskeletal Comment: Contacted [facility] for HHPT. There is confusion on a referral that was placed. Contacted the Hospital Liaison for clarification. The patient has some weakness  and is interested in gaining strength      Psychosocial Psychosocial Symptoms Reported: No symptoms reported         There were no vitals  filed for this visit.  Medications Reviewed Today     Reviewed by Moises Reusing, RN (Case Manager) on 11/06/23 at 1242  Med List Status: <None>   Medication Order Taking? Sig Documenting Provider Last Dose Status Informant  Accu-Chek Softclix Lancets lancets [id number]  Use with device to check sugars twice daily Orlean Alan HERO, FNP  Active Family Member, Pharmacy Records  albuterol  (VENTOLIN  HFA) 108 [phone] Base) MCG/ACT inhaler [id number]  Inhale 2 puffs into the lungs every 6 (six) hours as needed for wheezing or shortness of breath. Orlean Alan HERO, FNP  Active Family Member, Pharmacy Records  apixaban  (ELIQUIS ) 5 MG TABS tablet [id number]  Take 1 tablet (5 mg total) by mouth 2 (two) times daily. Josette Ade, MD  Active   aspirin  EC 81 MG tablet [id number]  Take 1 tablet (81 mg total) by mouth daily. Swallow whole. Josette Ade, MD  Active   betamethasone , augmented, (DIPROLENE ) 0.05 % lotion [id number] No Apply 1 Application topically 2 (two) times daily. [provider] Unknown Active Family Member, Pharmacy Records  Cholecalciferol  (VITAMIN D3) 125 MCG (5000 UT) CAPS [id number]  Take 1 capsule (5,000 Units total) by mouth daily. Orlean Alan HERO, FNP  Active Family Member, Pharmacy Records  diclofenac  sodium (VOLTAREN ) 1 % GEL [id number] No Apply 2 g topically 2 (two) times daily as needed (pain).  [provider] Unknown Active Pharmacy Records, Family Member  docusate sodium  (COLACE) 100 MG capsule [id number] No Take 1 capsule (100 mg total) by mouth 2 (two) times daily. Viviann Pastor, MD Unknown Active Pharmacy Records, Family Member  dorzolamide -timolol  (COSOPT ) 2-0.5 % ophthalmic solution [id number]  Place 1 drop into both eyes 2 (two) times daily. [provider]  Active Family Member, Pharmacy Records  doxepin  (SINEQUAN ) 10 MG capsule [id number]  TAKE 1 CAPSULE BY MOUTH AT BEDTIME Orlean Alan HERO, FNP  Active Family Member, Pharmacy Records   DULoxetine  (CYMBALTA ) 60 MG capsule [id number]  TAKE 1 CAPSULE BY MOUTH DAILY Orlean Alan HERO, FNP  Active Family Member, Pharmacy Records  esomeprazole  (NEXIUM ) 40 MG capsule [id number]  Take 1 capsule (40 mg total) by mouth daily. Orlean Alan HERO, FNP  Active Family Member, Pharmacy Records  ferrous sulfate  [phone] FE) MG tablet [id number]  Take 325 mg by mouth daily with breakfast. [provider]  Active Pharmacy Records, Family Member           Med Note CATHY OVAL DEL   Dju Jun 10, 2022  3:10 PM)    fluticasone  (FLONASE ) 50 MCG/ACT nasal spray [id number]  Place 2 sprays into both nostrils daily as needed for allergies. Orlean Alan HERO, FNP  Active Family Member, Pharmacy Records  furosemide  (LASIX ) 20 MG tablet [id number]  TAKE 1 TABLET BY MOUTH DAILY Orlean Alan HERO, FNP  Active Family Member, Pharmacy Records  glucose blood (ACCU-CHEK GUIDE TEST) test strip [id number]  CHECK BLOOD SUGAR TWICE DAILY Orlean Alan HERO, FNP  Active Family Member, Pharmacy Records  hydrOXYzine  (ATARAX ) 25 MG tablet [id number]  Take 25 mg by mouth at bedtime as needed (insomnia). [provider]  Active Pharmacy Records, Family Member  insulin  glargine (LANTUS  SOLOSTAR) 100 UNIT/ML Solostar Pen [id number]  Inject 9 Units into the skin at bedtime.  Patient taking differently: Inject 10 Units into the skin at bedtime.  Josette Ade, MD  Active   insulin  lispro (HUMALOG ) 100 UNIT/ML KwikPen [id number]  Inject 2 Units into the skin 3 (three) times daily with meals. If eating and Blood Glucose (BG) 80 or higher inject 2 units for meal coverage and add correction dose per scale. If not eating, correction dose only. BG <150= 0 unit; BG 150-200= 1 unit; BG 201-250= 2 unit; BG 251-300= 3 unit; BG 301-350= 4 unit; BG 351-400= 5 unit; BG >400= 6 unit and Call Primary Care. Josette Ade, MD  Active   Insulin  Pen Needle (BD PEN NEEDLE NANO U/F) 32G X 4 MM MISC [id number]  1 each by Does not apply  route in the morning, at noon, in the evening, and at bedtime. Orlean Alan HERO, FNP  Active Family Member, Pharmacy Records  lactulose  (CHRONULAC ) 10 GM/15ML solution [id number]  Take 15 mLs (10 g total) by mouth 2 (two) times daily as needed for mild constipation, moderate constipation or severe constipation.  Patient not taking: Reported on 11/06/2023   Orlean Alan HERO, FNP  Active Family Member, Pharmacy Records  losartan  (COZAAR ) 50 MG tablet [id number]  TAKE 1 TABLET BY MOUTH DAILY Orlean Alan HERO, FNP  Active Family Member, Pharmacy Records  oxyCODONE  (OXY IR/ROXICODONE ) 5 MG immediate release tablet [id number]  Take 1 tablet (5 mg total) by mouth every 6 (six) hours as needed for severe pain (pain score 7-10).  Patient not taking: Reported on 11/06/2023   Josette Ade, MD  Active   polyethylene glycol (MIRALAX  / GLYCOLAX ) 17 g packet [id number]  Take 17 g by mouth daily. Josette Ade, MD  Active   potassium chloride  SA (K-DUR,KLOR-CON ) 20 MEQ tablet [id number]  Take 20 mEq by mouth daily. [provider]  Active Pharmacy Records, Family Member  pravastatin  (PRAVACHOL ) 40 MG tablet [id number]  TAKE 1 TABLET BY MOUTH DAILY Orlean Alan HERO, FNP  Active Family Member, Pharmacy Records  vitamin B-12 (CYANOCOBALAMIN ) 1000 MCG tablet [id number]  Take 1,000 mcg by mouth daily. [provider]  Active Pharmacy Records, Family Member           Med Note WORLEY, LUCIANO RAMAN   Fri Jun 16, 2016  4:43 PM)              Recommendation:   Continue Current Plan of Care  Follow Up Plan:   Telephone follow-up in 1 week  Medford Balboa, BSN, RN [place]  VBCI - [facility] RN Care Manager [phone]

## 2023-11-06 NOTE — Progress Notes (Signed)
 11/06/2023 Name: Wanda Gardner MRN: [id number] DOB: 1940-05-22  Chief Complaint  Patient presents with   Medication Management   Diabetes    Wanda Gardner is a 83 y.o. year old female who presented for a telephone visit.   They were referred to the pharmacist by their PCP for assistance in managing complex medication management.    Subjective:  Care Team: Primary Care Provider: Orlean Alan HERO, FNP   Medication Access/Adherence  Current Pharmacy:  [facility] - [place], [place] - [address] [place] [address] Phone: [phone] Fax: [phone]  [facility] [address] [place]), [place] - [address] [place]) [place] [address] Phone: [phone] Fax: [phone]   Patient reports affordability concerns with their medications: No  Patient reports access/transportation concerns to their pharmacy: No  Patient reports adherence concerns with their medications:  No  but patient not able to really report everything she is taking verbally   Diabetes:  Current medications: Lantus  10 units once daily, Humalog  2 units TID with meals (really only using once or twice a day as she normally does not eat TID) Medications tried in the past: Metformin   Current glucose readings: Did not discuss today, patient had not been up long and it takes awhile for her eyes to adjust to see readings Using finger prick  Observed patterns:  Patient denies hypoglycemic s/sx including dizziness, shakiness, sweating. Patient denies hyperglycemic symptoms including polyuria, polydipsia, polyphagia, nocturia, neuropathy, blurred vision.   Current medication access support: None  Macrovascular and Microvascular Risk Reduction:  Statin? yes (pravastatin ); ACEi/ARB? yes (losartan ) Last urinary albumin/creatinine ratio:  Lab Results  Component Value Date   MICRALBCREAT <30 12/14/2022   Last eye  exam:   Last foot exam: No foot exam found Tobacco Use:  Tobacco Use: High Risk (10/19/2023)   Patient History    Smoking Tobacco Use: Every Day    Smokeless Tobacco Use: Never    Passive Exposure: Not on file   Hypertension:  Current medications: Losartan  50mg  1 tab daily, Lasix  20mg  daily Medications previously tried: Chorthalidone, HCTZ  Hyperlipidemia/ASCVD Risk Reduction  Current lipid lowering medications: Pravastatin  40mg  Medications tried in the past: None  Antiplatelet regimen: Aspirin  81mg  daily   Atrial Fibrillation:  Current medications: Eliquis  5mg  BID Past: Metoprolol   Patient denies hypotensive s/sx including dizziness, lightheadedness. Patient denies hypertensive symptoms including headache, chest pain, shortness of breath   Current medication access support: None    Objective:  Lab Results  Component Value Date   HGBA1C 6.5 (H) 10/15/2023    Lab Results  Component Value Date   CREATININE 0.95 10/20/2023   BUN 14 10/20/2023   NA 136 10/20/2023   K 4.3 10/20/2023   CL 100 10/20/2023   CO2 25 10/20/2023    Lab Results  Component Value Date   CHOL 140 12/14/2022   HDL 47 12/14/2022   LDLCALC 79 12/14/2022   TRIG 72 12/14/2022   CHOLHDL 3.0 12/14/2022    Medications Reviewed Today     Reviewed by Lionell Jon DEL, RPH (Pharmacist) on 11/06/23 at 1121  Med List Status: <None>   Medication Order Taking? Sig Documenting Provider Last Dose Status Informant  Accu-Chek Softclix Lancets lancets [id number]  Use with device to check sugars twice daily Orlean Alan HERO, FNP  Active Family Member, Pharmacy Records  albuterol  (VENTOLIN  HFA) 108 [phone] Base) MCG/ACT inhaler [id number] Yes Inhale 2 puffs into the lungs every  6 (six) hours as needed for wheezing or shortness of breath. Orlean Alan HERO, FNP  Active Family Member, Pharmacy Records  apixaban  (ELIQUIS ) 5 MG TABS tablet [id number] Yes Take 1 tablet (5 mg total) by mouth 2 (two) times daily.  Josette Ade, MD  Active   aspirin  EC 81 MG tablet [id number] Yes Take 1 tablet (81 mg total) by mouth daily. Swallow whole. Josette Ade, MD  Active   betamethasone , augmented, (DIPROLENE ) 0.05 % lotion [id number]  Apply 1 Application topically 2 (two) times daily. [provider]  Active Family Member, Pharmacy Records  Cholecalciferol  (VITAMIN D3) 125 MCG (5000 UT) CAPS [id number] Yes Take 1 capsule (5,000 Units total) by mouth daily. Orlean Alan HERO, FNP  Active Family Member, Pharmacy Records  diclofenac  sodium (VOLTAREN ) 1 % GEL [id number]  Apply 2 g topically 2 (two) times daily as needed (pain).  [provider]  Active Pharmacy Records, Family Member  docusate sodium  (COLACE) 100 MG capsule [id number]  Take 1 capsule (100 mg total) by mouth 2 (two) times daily. Viviann Pastor, MD  Active Pharmacy Records, Family Member  dorzolamide -timolol  (COSOPT ) 2-0.5 % ophthalmic solution [id number] Yes Place 1 drop into both eyes 2 (two) times daily. [provider]  Active Family Member, Pharmacy Records  doxepin  (SINEQUAN ) 10 MG capsule [id number] Yes TAKE 1 CAPSULE BY MOUTH AT BEDTIME Orlean Alan HERO, FNP  Active Family Member, Pharmacy Records  DULoxetine  (CYMBALTA ) 60 MG capsule [id number] Yes TAKE 1 CAPSULE BY MOUTH DAILY Orlean Alan HERO, FNP  Active Family Member, Pharmacy Records  esomeprazole  (NEXIUM ) 40 MG capsule [id number]  Take 1 capsule (40 mg total) by mouth daily. Orlean Alan HERO, FNP  Active Family Member, Pharmacy Records  ferrous sulfate  325 [phone] FE) MG tablet [id number] Yes Take 325 mg by mouth daily with breakfast. [provider]  Active Pharmacy Records, Family Member           Med Note CATHY OVAL DEL   Dju Jun 10, 2022  3:10 PM)    fluticasone  (FLONASE ) 50 MCG/ACT nasal spray [id number] Yes Place 2 sprays into both nostrils daily as needed for allergies. Orlean Alan HERO, FNP  Active Family Member, Pharmacy Records  furosemide   (LASIX ) 20 MG tablet [id number] Yes TAKE 1 TABLET BY MOUTH DAILY Orlean Alan HERO, FNP  Active Family Member, Pharmacy Records  glucose blood (ACCU-CHEK GUIDE TEST) test strip [id number]  CHECK BLOOD SUGAR TWICE DAILY Orlean Alan HERO, FNP  Active Family Member, Pharmacy Records  hydrOXYzine  (ATARAX ) 25 MG tablet [id number]  Take 25 mg by mouth at bedtime as needed (insomnia). [provider]  Active Pharmacy Records, Family Member  insulin  glargine (LANTUS  SOLOSTAR) 100 UNIT/ML Solostar Pen [id number] Yes Inject 9 Units into the skin at bedtime.  Patient taking differently: Inject 10 Units into the skin at bedtime.   Josette Ade, MD  Active   insulin  lispro (HUMALOG ) 100 UNIT/ML KwikPen [id number] Yes Inject 2 Units into the skin 3 (three) times daily with meals. If eating and Blood Glucose (BG) 80 or higher inject 2 units for meal coverage and add correction dose per scale. If not eating, correction dose only. BG <150= 0 unit; BG 150-200= 1 unit; BG 201-250= 2 unit; BG 251-300= 3 unit; BG 301-350= 4 unit; BG 351-400= 5 unit; BG >400= 6 unit and Call Primary Care. Josette Ade, MD  Active   Insulin  Pen Needle (BD PEN NEEDLE NANO U/F) 32G X 4 MM MISC [id number] Yes 1 each by Does not  apply route in the morning, at noon, in the evening, and at bedtime. Orlean Alan HERO, FNP  Active Family Member, Pharmacy Records  lactulose  (CHRONULAC ) 10 GM/15ML solution [id number]  Take 15 mLs (10 g total) by mouth 2 (two) times daily as needed for mild constipation, moderate constipation or severe constipation.  Patient not taking: Reported on 11/06/2023   Orlean Alan HERO, FNP  Active Family Member, Pharmacy Records  losartan  (COZAAR ) 50 MG tablet [id number] Yes TAKE 1 TABLET BY MOUTH DAILY Orlean Alan HERO, FNP  Active Family Member, Pharmacy Records  oxyCODONE  (OXY IR/ROXICODONE ) 5 MG immediate release tablet [id number]  Take 1 tablet (5 mg total) by mouth every 6 (six) hours as needed for severe pain  (pain score 7-10).  Patient not taking: Reported on 11/06/2023   Josette Ade, MD  Active   polyethylene glycol (MIRALAX  / GLYCOLAX ) 17 g packet [id number] Yes Take 17 g by mouth daily. Josette Ade, MD  Active   potassium chloride  SA (K-DUR,KLOR-CON ) 20 MEQ tablet [id number]  Take 20 mEq by mouth daily. [provider]  Active Pharmacy Records, Family Member  pravastatin  (PRAVACHOL ) 40 MG tablet [id number] Yes TAKE 1 TABLET BY MOUTH DAILY Orlean Alan HERO, FNP  Active Family Member, Pharmacy Records  vitamin B-12 (CYANOCOBALAMIN ) 1000 MCG tablet [id number] Yes Take 1,000 mcg by mouth daily. [provider]  Active Pharmacy Records, Family Member           Med Note WORLEY, LUCIANO RAMAN   Fri Jun 16, 2016  4:43 PM)                Assessment/Plan:   Diabetes: - Currently controlled; goal A1c <7%. Cardiorenal risk reduction is optimized.. Blood pressure is at goal <130/80. LDL is not at goal.  - Reviewed goal A1c, goal fasting, and goal 2 hour post prandial glucose. Recommended to check glucose three times daily - Recommend to continue continue current med therapy. -Recommend CGM use with insulin  use, patient wants to come in office for review and samples first to see if she wants to use  Hypertension: - Currently controlled - Reviewed long term cardiovascular and renal outcomes of uncontrolled blood pressure - Reviewed appropriate blood pressure monitoring technique and reviewed goal blood pressure. Recommended to check home blood pressure and heart rate once weekly - Recommend to continue current medication therapy      Hyperlipidemia/ASCVD Risk Reduction: - Currently uncontrolled.  - Reviewed long term complications of uncontrolled cholesterol - Reviewed dietary recommendations including low fat diet - Recommend to get updated lipid panel at next PCP visit, if LDL still above 70, can consider statin change or edition of zetia     Atrial Fibrillation: -  Currently controlled - Reviewed importance of adherence to anticoagulant for stroke prevention. - Reviewed appropriate blood pressure monitoring technique and reviewed goal blood pressure. Recommended to check home blood pressure and heart rate daily - Recommend to continue current med therapy   Several meds are showing not filled since march/April, asked patient to come into office with meds for full review  Follow Up Plan: 11/13/23 in office  Jon VEAR Lindau, PharmD Clinical Pharmacist [phone]

## 2023-11-07 NOTE — Transitions of Care (Post Inpatient/ED Visit) (Signed)
 11/07/2023  Patient ID: [name], female   DOB: 1940-08-25, 83 y.o.   MRN: [id number]   Channing Ee with [facility] returned the phone call. She states she does have a referral for HHPT in the chart and will call the patient to help coordinate a SOC date. Recommended calling the patient after 12noon as the patient will not answer the phone for early morning calls.   Medford Balboa, BSN, RN [place]  VBCI - [employer] [facility] RN Care Manager [phone]

## 2023-11-13 ENCOUNTER — Other Ambulatory Visit: Payer: Self-pay | Admitting: Family

## 2023-11-13 ENCOUNTER — Other Ambulatory Visit: Payer: Self-pay

## 2023-11-13 DIAGNOSIS — E1165 Type 2 diabetes mellitus with hyperglycemia: Secondary | ICD-10-CM

## 2023-11-13 NOTE — Progress Notes (Signed)
 11/13/2023 Name: Wanda Gardner MRN: [id number] DOB: 06-21-1940  Chief Complaint  Patient presents with   Medication Management   Diabetes    Wanda Gardner is a 83 y.o. year old female who presented in office with her sister and all of her medications.   They were referred to the pharmacist by their PCP for assistance in managing complex medication management.    Subjective:  Care Team: Primary Care Provider: Orlean Alan HERO, FNP   Medication Access/Adherence  Current Pharmacy:  [facility] - [place], [place] - [address] [place] [address] Phone: [phone] Fax: [phone]  [facility] [address] [place]), [place] - [address] [place]) [place] [address] Phone: [phone] Fax: [phone]   Patient reports affordability concerns with their medications: No  Patient reports access/transportation concerns to their pharmacy: No  Patient reports adherence concerns with their medications:  Yes - patient brings in several empty bottles to this visit, including her cholesterol med  Diabetes:  Current medications: Lantus  10 units once daily, Humalog  2 units TID with meals (really only using once or twice a day as she normally does not eat TID) Medications tried in the past: Metformin   Current glucose readings: Did not discuss today, patient had not been up long and it takes awhile for her eyes to adjust to see readings Using finger prick  Observed patterns:  Patient denies hypoglycemic s/sx including dizziness, shakiness, sweating. Patient denies hyperglycemic symptoms including polyuria, polydipsia, polyphagia, nocturia, neuropathy, blurred vision.   Current medication access support: None  Macrovascular and Microvascular Risk Reduction:  Statin? yes (pravastatin ); ACEi/ARB? yes (losartan ) Last urinary albumin/creatinine ratio:  Lab Results  Component Value Date    MICRALBCREAT <30 12/14/2022   Last eye exam:   Last foot exam: No foot exam found Tobacco Use:  Tobacco Use: High Risk (10/19/2023)   Patient History    Smoking Tobacco Use: Every Day    Smokeless Tobacco Use: Never    Passive Exposure: Not on file   Hypertension:  Current medications: Losartan  50mg  1 tab daily, Lasix  20mg  daily Medications previously tried: Chorthalidone, hydrochlorothiazide   Brings in chlorthalidone  to appt and reports she has been taking despite being told to stop at hospital  Hyperlipidemia/ASCVD Risk Reduction  Current lipid lowering medications: Pravastatin  40mg  Medications tried in the past: None  Antiplatelet regimen: Aspirin  81mg  daily   Atrial Fibrillation:  Current medications: Eliquis  5mg  BID Past: Metoprolol   Patient denies hypotensive s/sx including dizziness, lightheadedness. Patient denies hypertensive symptoms including headache, chest pain, shortness of breath   Current medication access support: None    Objective:  Lab Results  Component Value Date   HGBA1C 6.5 (H) 10/15/2023    Lab Results  Component Value Date   CREATININE 0.95 10/20/2023   BUN 14 10/20/2023   NA 136 10/20/2023   K 4.3 10/20/2023   CL 100 10/20/2023   CO2 25 10/20/2023    Lab Results  Component Value Date   CHOL 140 12/14/2022   HDL 47 12/14/2022   LDLCALC 79 12/14/2022   TRIG 72 12/14/2022   CHOLHDL 3.0 12/14/2022    Medications Reviewed Today     Reviewed by Lionell Jon DEL, RPH (Pharmacist) on 11/13/23 at 1603  Med List Status: <None>   Medication Order Taking? Sig Documenting Provider Last Dose Status Informant  Accu-Chek Softclix Lancets lancets [id number] Yes Use with device to check sugars twice daily Orlean Alan HERO,  FNP  Active Family Member, Pharmacy Records  albuterol  (VENTOLIN  HFA) 108 [phone] Base) MCG/ACT inhaler [id number] Yes Inhale 2 puffs into the lungs every 6 (six) hours as needed for wheezing or shortness of breath. Orlean Alan HERO, FNP  Active Family Member, Pharmacy Records  apixaban  (ELIQUIS ) 5 MG TABS tablet [id number] Yes Take 1 tablet (5 mg total) by mouth 2 (two) times daily. Josette Ade, MD  Active   aspirin  EC 81 MG tablet [id number] Yes Take 1 tablet (81 mg total) by mouth daily. Swallow whole. Josette Ade, MD  Active   betamethasone , augmented, (DIPROLENE ) 0.05 % lotion [id number]  Apply 1 Application topically 2 (two) times daily. [provider]  Active Family Member, Pharmacy Records  Cholecalciferol  (VITAMIN D3) 125 MCG (5000 UT) CAPS [id number] Yes Take 1 capsule (5,000 Units total) by mouth daily. Orlean Alan HERO, FNP  Active Family Member, Pharmacy Records  diclofenac  sodium (VOLTAREN ) 1 % GEL [id number]  Apply 2 g topically 2 (two) times daily as needed (pain).   Patient not taking: Reported on 11/13/2023   [provider]  Active Pharmacy Records, Family Member  docusate sodium  (COLACE) 100 MG capsule [id number] Yes Take 1 capsule (100 mg total) by mouth 2 (two) times daily. Viviann Pastor, MD  Active Pharmacy Records, Family Member  dorzolamide -timolol  (COSOPT ) 2-0.5 % ophthalmic solution [id number] Yes Place 1 drop into both eyes 2 (two) times daily. [provider]  Active Family Member, Pharmacy Records  doxepin  (SINEQUAN ) 10 MG capsule [id number] Yes TAKE 1 CAPSULE BY MOUTH AT BEDTIME Orlean Alan HERO, FNP  Active Family Member, Pharmacy Records  DULoxetine  (CYMBALTA ) 60 MG capsule [id number] Yes TAKE 1 CAPSULE BY MOUTH DAILY Orlean Alan HERO, FNP  Active Family Member, Pharmacy Records  esomeprazole  (NEXIUM ) 40 MG capsule [id number] Yes Take 1 capsule (40 mg total) by mouth daily. Orlean Alan HERO, FNP  Active Family Member, Pharmacy Records  ferrous sulfate  325 [phone] FE) MG tablet [id number] Yes Take 325 mg by mouth daily with breakfast. [provider]  Active Pharmacy Records, Family Member           Med Note CATHY OVAL DEL   Dju Jun 10, 2022   3:10 PM)    fluticasone  (FLONASE ) 50 MCG/ACT nasal spray [id number] Yes Place 2 sprays into both nostrils daily as needed for allergies. Orlean Alan HERO, FNP  Active Family Member, Pharmacy Records  furosemide  (LASIX ) 20 MG tablet [id number] Yes TAKE 1 TABLET BY MOUTH DAILY Orlean Alan HERO, FNP  Active Family Member, Pharmacy Records  glucose blood (ACCU-CHEK GUIDE TEST) test strip [id number]  CHECK BLOOD SUGAR TWICE DAILY Orlean Alan HERO, FNP  Active Family Member, Pharmacy Records  hydrOXYzine  (ATARAX ) 25 MG tablet [id number]  Take 25 mg by mouth at bedtime as needed (insomnia).  Patient not taking: Reported on 11/13/2023   [provider]  Active Pharmacy Records, Family Member  insulin  glargine (LANTUS  SOLOSTAR) 100 UNIT/ML Solostar Pen [id number]  Inject 9 Units into the skin at bedtime.  Patient taking differently: Inject 10 Units into the skin at bedtime.   Josette Ade, MD  Active   insulin  lispro (HUMALOG ) 100 UNIT/ML KwikPen [id number] Yes Inject 2 Units into the skin 3 (three) times daily with meals. If eating and Blood Glucose (BG) 80 or higher inject 2 units for meal coverage and add correction dose per scale. If not eating, correction dose only. BG <150= 0 unit; BG 150-200= 1 unit; BG 201-250= 2 unit; BG 251-300= 3 unit;  BG 301-350= 4 unit; BG 351-400= 5 unit; BG >400= 6 unit and Call Primary Care. Josette Ade, MD  Active   Insulin  Pen Needle (BD PEN NEEDLE NANO U/F) 32G X 4 MM MISC [id number] Yes 1 each by Does not apply route in the morning, at noon, in the evening, and at bedtime. Orlean Alan HERO, FNP  Active Family Member, Pharmacy Records  lactulose  (CHRONULAC ) 10 GM/15ML solution [id number]  Take 15 mLs (10 g total) by mouth 2 (two) times daily as needed for mild constipation, moderate constipation or severe constipation.  Patient not taking: Reported on 11/06/2023   Orlean Alan HERO, FNP  Active Family Member, Pharmacy Records  losartan  (COZAAR ) 50 MG tablet  [id number] Yes TAKE 1 TABLET BY MOUTH DAILY Orlean Alan HERO, FNP  Active Family Member, Pharmacy Records  oxyCODONE  (OXY IR/ROXICODONE ) 5 MG immediate release tablet [id number]  Take 1 tablet (5 mg total) by mouth every 6 (six) hours as needed for severe pain (pain score 7-10).  Patient not taking: Reported on 11/06/2023   Josette Ade, MD  Active   polyethylene glycol (MIRALAX  / GLYCOLAX ) 17 g packet [id number]  Take 17 g by mouth daily. Josette Ade, MD  Active   potassium chloride  SA (K-DUR,KLOR-CON ) 20 MEQ tablet [id number] Yes Take 20 mEq by mouth daily. [provider]  Active Pharmacy Records, Family Member  pravastatin  (PRAVACHOL ) 40 MG tablet [id number] Yes TAKE 1 TABLET BY MOUTH DAILY Orlean Alan HERO, FNP  Active Family Member, Pharmacy Records  vitamin B-12 (CYANOCOBALAMIN ) 1000 MCG tablet [id number] Yes Take 1,000 mcg by mouth daily. [provider]  Active Pharmacy Records, Family Member           Med Note WORLEY, LUCIANO RAMAN   Fri Jun 16, 2016  4:43 PM)                Assessment/Plan:   Diabetes: - Currently controlled; goal A1c <7%. Cardiorenal risk reduction is optimized.. Blood pressure is at goal <130/80. LDL is not at goal.  - Reviewed goal A1c, goal fasting, and goal 2 hour post prandial glucose. Recommended to check glucose three times daily - Recommend to continue continue current med therapy. -Placed dexcom g7 sensor sample in office and provided receiver, Patient will report back in 2 weeks if she wants rx sent in -Stop metformin  as you were instructed in hospital (has been taking prn in evening if BG over 200). May be able to resume at PCP visit  Hypertension: - Currently controlled - Reviewed long term cardiovascular and renal outcomes of uncontrolled blood pressure - Reviewed appropriate blood pressure monitoring technique and reviewed goal blood pressure. Recommended to check home blood pressure and heart rate once weekly - Recommend  to continue current medication therapy. Stop chlorthalidone  as instructed at hospital.     Hyperlipidemia/ASCVD Risk Reduction: - Currently uncontrolled.  - Reviewed long term complications of uncontrolled cholesterol - Reviewed dietary recommendations including low fat diet - Recommend to get updated lipid panel at next PCP visit, if LDL still above 70, can consider statin change or edition of zetia     Atrial Fibrillation: - Currently controlled - Reviewed importance of adherence to anticoagulant for stroke prevention. - Reviewed appropriate blood pressure monitoring technique and reviewed goal blood pressure. Recommended to check home blood pressure and heart rate daily - Recommend to continue current med therapy   Several meds are showing not filled since march/April, asked patient to come into office with meds for full review  Follow Up Plan: 11/13/23 in office  Jon VEAR Lindau, PharmD Clinical Pharmacist [phone]

## 2023-11-14 ENCOUNTER — Other Ambulatory Visit: Payer: Self-pay

## 2023-11-14 NOTE — Transitions of Care (Post Inpatient/ED Visit) (Signed)
 Transition of Care week 4  Visit Note  11/14/2023  Name: Wanda Gardner MRN: [id number]          DOB: [birth year]  Situation: Patient enrolled in [facility] 30-day program. Visit completed with Winton Berkshire by telephone.   Background:  Past Medical History:  Diagnosis Date   Acid reflux    AKI (acute kidney injury) 06/10/2022   Arthritis    Asthma    Chest pain, unspecified 10/30/2013   Formatting of this note might be different from the original. Note: Unchanged Note: Unchanged Formatting of this note might be different from the original. Note: Unchanged Note: Unchanged   Constipation 04/20/2011   Formatting of this note might be different from the original. Note: Unchanged - probably [facility]. Note: Unchanged - probably [facility].   COPD (chronic obstructive pulmonary disease) (HCC)    Cough 10/30/2013   Formatting of this note might be different from the original. Note: Unchanged Note: Unchanged   Depression    Diabetes mellitus (HCC) 01/10/2022   Diabetes mellitus without complication (HCC)    Dyspnea    Glaucoma    Headache    right side of head   Headache 07/25/2011   Hyperlipidemia    Hypertension    Knee stiff 05/12/2022   Lens replaced 03/22/2010   Neuropathic pain of hands and feet    PONV (postoperative nausea and vomiting) 1982   back with my hysterectomy.   Pseudophakia 03/22/2010   Sleep apnea    Doesn't use CPAP   Stiffness of shoulder joint 05/12/2022   Trigger finger of right hand 01/10/2022   Wears dentures    full upper and lower    Assessment: Patient Reported Symptoms: Cognitive Cognitive Status: Alert and oriented to person, place, and time, Requires Assistance Decision Making, Struggling with memory recall      Neurological Neurological Review of Symptoms: No symptoms reported    HEENT HEENT Symptoms Reported: Eye pain HEENT Management Strategies: Routine screening    Cardiovascular Cardiovascular Symptoms Reported: Swelling in legs or  feet Cardiovascular Comment: The patient states her foot is swelling and she took her medication  Respiratory Respiratory Symptoms Reported: No symptoms reported    Endocrine Endocrine Symptoms Reported: No symptoms reported Is patient diabetic?: Yes Is patient checking blood sugars at home?: Yes List most recent blood sugar readings, include date and time of day: The patient has a new Dexcom and she just woke up and has not taken her blood sugar yet    Gastrointestinal Gastrointestinal Symptoms Reported: Constipation Gastrointestinal Management Strategies: Medication therapy, Incontinence garment/pad, Coping strategies Gastrointestinal Comment: She states she is taking all kinds of medication for constipation and is still struggling    Genitourinary Genitourinary Symptoms Reported: Incontinence Genitourinary Management Strategies: Incontinence garment/pad  Integumentary Integumentary Symptoms Reported: No symptoms reported    Musculoskeletal Musculoskelatal Symptoms Reviewed: Limited mobility, Weakness Musculoskeletal Comment: The patient has not been answering her phone or checking voicemails. She still has some limited mobility      Psychosocial Psychosocial Symptoms Reported: No symptoms reported         There were no vitals filed for this visit.  Medications Reviewed Today     Reviewed by Moises Reusing, RN (Case Manager) on 11/14/23 at 1308  Med List Status: <None>   Medication Order Taking? Sig Documenting Provider Last Dose Status Informant  Accu-Chek Softclix Lancets lancets [id number]  Use with device to check sugars twice daily Orlean Alan HERO, FNP  Active Family Member, Pharmacy Records  albuterol  (VENTOLIN  HFA) 108 (90 Base) MCG/ACT inhaler [id number]  Inhale 2 puffs into the lungs every 6 (six) hours as needed for wheezing or shortness of breath. Orlean Alan HERO, FNP  Active Family Member, Pharmacy Records  apixaban  (ELIQUIS ) 5 MG TABS tablet [id number]  Take 1  tablet (5 mg total) by mouth 2 (two) times daily. Josette Ade, MD  Active   aspirin  EC 81 MG tablet [id number]  Take 1 tablet (81 mg total) by mouth daily. Swallow whole. Josette Ade, MD  Active   betamethasone , augmented, (DIPROLENE ) 0.05 % lotion [id number] No Apply 1 Application topically 2 (two) times daily. [provider] Unknown Active Family Member, Pharmacy Records  Cholecalciferol  (VITAMIN D3) 125 MCG (5000 UT) CAPS [id number]  Take 1 capsule (5,000 Units total) by mouth daily. Orlean Alan HERO, FNP  Active Family Member, Pharmacy Records  diclofenac  sodium (VOLTAREN ) 1 % GEL [id number]  Apply 2 g topically 2 (two) times daily as needed (pain).   Patient not taking: Reported on 11/13/2023   [provider]  Active Pharmacy Records, Family Member  docusate sodium  (COLACE) 100 MG capsule [id number]  Take 1 capsule (100 mg total) by mouth 2 (two) times daily. Viviann Pastor, MD  Active Pharmacy Records, Family Member  dorzolamide -timolol  (COSOPT ) 2-0.5 % ophthalmic solution [id number]  Place 1 drop into both eyes 2 (two) times daily. [provider]  Active Family Member, Pharmacy Records  doxepin  (SINEQUAN ) 10 MG capsule [id number]  TAKE 1 CAPSULE BY MOUTH AT BEDTIME Orlean Alan HERO, FNP  Active Family Member, Pharmacy Records  DULoxetine  (CYMBALTA ) 60 MG capsule [id number]  TAKE 1 CAPSULE BY MOUTH DAILY Orlean Alan HERO, FNP  Active Family Member, Pharmacy Records  esomeprazole  (NEXIUM ) 40 MG capsule [id number]  Take 1 capsule (40 mg total) by mouth daily. Orlean Alan HERO, FNP  Active Family Member, Pharmacy Records  ferrous sulfate  325 [phone] FE) MG tablet [id number]  Take 325 mg by mouth daily with breakfast. [provider]  Active Pharmacy Records, Family Member           Med Note CATHY OVAL DEL   Dju Jun 10, 2022  3:10 PM)    fluticasone  (FLONASE ) 50 MCG/ACT nasal spray [id number]  Place 2 sprays into both nostrils daily as needed for  allergies. Orlean Alan HERO, FNP  Active Family Member, Pharmacy Records  furosemide  (LASIX ) 20 MG tablet [id number]  TAKE 1 TABLET BY MOUTH DAILY Orlean Alan HERO, FNP  Active Family Member, Pharmacy Records  glucose blood (ACCU-CHEK GUIDE TEST) test strip [id number]  CHECK BLOOD SUGAR TWICE DAILY Orlean Alan HERO, FNP  Active Family Member, Pharmacy Records  hydrOXYzine  (ATARAX ) 25 MG tablet [id number]  Take 25 mg by mouth at bedtime as needed (insomnia).  Patient not taking: Reported on 11/13/2023   [provider]  Active Pharmacy Records, Family Member  insulin  glargine (LANTUS  SOLOSTAR) 100 UNIT/ML Solostar Pen [id number]  Inject 9 Units into the skin at bedtime.  Patient taking differently: Inject 10 Units into the skin at bedtime.   Josette Ade, MD  Active   insulin  lispro (HUMALOG ) 100 UNIT/ML KwikPen [id number]  Inject 2 Units into the skin 3 (three) times daily with meals. If eating and Blood Glucose (BG) 80 or higher inject 2 units for meal coverage and add correction dose per scale. If not eating, correction dose only. BG <150= 0 unit; BG 150-200= 1 unit; BG 201-250= 2 unit; BG 251-300= 3 unit; BG 301-350= 4 unit; BG 351-400= 5 unit;  BG >400= 6 unit and Call Primary Care. Josette Ade, MD  Active   Insulin  Pen Needle (BD PEN NEEDLE NANO U/F) 32G X 4 MM MISC [id number]  1 each by Does not apply route in the morning, at noon, in the evening, and at bedtime. Orlean Alan HERO, FNP  Active Family Member, Pharmacy Records  lactulose  (CHRONULAC ) 10 GM/15ML solution [id number]  Take 15 mLs (10 g total) by mouth 2 (two) times daily as needed for mild constipation, moderate constipation or severe constipation.  Patient not taking: Reported on 11/06/2023   Orlean Alan HERO, FNP  Active Family Member, Pharmacy Records  losartan  (COZAAR ) 50 MG tablet [id number]  TAKE 1 TABLET BY MOUTH DAILY Orlean Alan HERO, FNP  Active Family Member, Pharmacy Records  oxyCODONE  (OXY IR/ROXICODONE ) 5 MG  immediate release tablet [id number]  Take 1 tablet (5 mg total) by mouth every 6 (six) hours as needed for severe pain (pain score 7-10).  Patient not taking: Reported on 11/06/2023   Josette Ade, MD  Active   polyethylene glycol (MIRALAX  / GLYCOLAX ) 17 g packet [id number]  Take 17 g by mouth daily. Josette Ade, MD  Active   potassium chloride  SA (K-DUR,KLOR-CON ) 20 MEQ tablet [id number]  Take 20 mEq by mouth daily. [provider]  Active Pharmacy Records, Family Member  pravastatin  (PRAVACHOL ) 40 MG tablet [id number]  TAKE 1 TABLET BY MOUTH DAILY Orlean Alan HERO, FNP  Active Family Member, Pharmacy Records  vitamin B-12 (CYANOCOBALAMIN ) 1000 MCG tablet [id number]  Take 1,000 mcg by mouth daily. [provider]  Active Pharmacy Records, Family Member           Med Note WORLEY, LUCIANO RAMAN   Fri Jun 16, 2016  4:43 PM)              Recommendation:   Continue Current Plan of Care  Follow Up Plan:   Telephone follow-up in 1 week  Medford Balboa, BSN, RN [place]  VBCI - [facility] RN Care Manager [phone]

## 2023-11-14 NOTE — Patient Instructions (Signed)
 Visit Information  Thank you for taking time to visit with me today. Please don't hesitate to contact me if I can be of assistance to you before our next scheduled telephone appointment.  Our next appointment is by telephone on Thursday October 2nd at 2:00pm  Following is a copy of your care plan:   Goals Addressed             This Visit's Progress    VBCI Transitions of Care (TOC) Care Plan       Problems: (reviewed 11/14/23) Recent Hospitalization for treatment of CAD Medication management barrier The patient did not understand her discharge medication instructions, No Hospital Follow Up Provider appointment The patient states she will call today. She has to arrange her appointments through her sister because she does not drive, and SDOH barrier: Academic librarian and Utility Needs 10/29/23 - Encouraged the patient to schedule with PCP and Vascular surgeon 11/06/23 - The patient has not followed up with scheduling vascular appointment. She states she has an appointment with PCP on 11/13/23  Goal: (reviewed 11/14/23) Over the next 30 days, the patient will not experience hospital readmission  Interventions: (reviewed 11/14/23)  CAD Interventions: Assessed understanding of CAD diagnosis Medications reviewed including medications utilized in CAD treatment plan Provided education on Importance of limiting foods high in cholesterol Reviewed Importance of taking all medications as prescribed Reviewed Importance of attending all scheduled provider appointments Advised patient to discuss Medications with provider Contact [facility] for HHPT and SN services. The agency has called and the patient has ignored the humber - 10/29/23 - The patient has not been compliant with calling back to Home Health 11/06/23 - Unclear on [facility] Referral. Follow up with [place] at [facility] for clarification 11/14/23 - Dexcom sensor placed on 11/13/23. Pharmacy met with the patient and reviewed her meds and  reinforced not taking meds discontinued by the hospital.   Patient Self Care Activities: (reviewed 11/14/23) Attend all scheduled provider appointments Call pharmacy for medication refills 3-7 days in advance of running out of medications Call provider office for new concerns or questions  Notify RN Care Manager of [facility] call rescheduling needs Participate in Transition of Care Program/Attend [facility] scheduled calls Perform all self care activities independently  Take medications as prescribed   Work with the social worker to address care coordination needs and will continue to work with the clinical team to address health care and disease management related needs  Plan:  Telephone follow up appointment with care management team member scheduled for:  Thursday October 2nd at 2:00pm        The patient has been provided with contact information for the care management team and has been advised to call with any health related questions or concerns.   Please call the care guide team at [phone] if you need to cancel or reschedule your appointment.   Please call the Suicide and Crisis Lifeline: 988 call the [place]  National Suicide Prevention Lifeline: 248-875-6658 or TTY: 616 873 6051 TTY [phone]) to talk to a trained counselor if you are experiencing a Mental Health or Behavioral Health Crisis or need someone to talk to.  Medford Balboa, BSN, RN [place]  VBCI - [employer] [facility] RN Care Manager [phone]

## 2023-11-16 DIAGNOSIS — H4051X3 Glaucoma secondary to other eye disorders, right eye, severe stage: Secondary | ICD-10-CM | POA: Diagnosis not present

## 2023-11-16 DIAGNOSIS — H401121 Primary open-angle glaucoma, left eye, mild stage: Secondary | ICD-10-CM | POA: Diagnosis not present

## 2023-11-21 ENCOUNTER — Other Ambulatory Visit (INDEPENDENT_AMBULATORY_CARE_PROVIDER_SITE_OTHER): Payer: Self-pay | Admitting: Vascular Surgery

## 2023-11-21 DIAGNOSIS — Z9889 Other specified postprocedural states: Secondary | ICD-10-CM

## 2023-11-22 ENCOUNTER — Ambulatory Visit (INDEPENDENT_AMBULATORY_CARE_PROVIDER_SITE_OTHER)

## 2023-11-22 ENCOUNTER — Ambulatory Visit (INDEPENDENT_AMBULATORY_CARE_PROVIDER_SITE_OTHER): Admitting: Nurse Practitioner

## 2023-11-22 ENCOUNTER — Telehealth: Payer: Self-pay

## 2023-11-22 ENCOUNTER — Encounter (INDEPENDENT_AMBULATORY_CARE_PROVIDER_SITE_OTHER): Payer: Self-pay | Admitting: Nurse Practitioner

## 2023-11-22 VITALS — BP 100/58 | HR 65 | Resp 18 | Ht 66.0 in | Wt 180.0 lb

## 2023-11-22 DIAGNOSIS — I739 Peripheral vascular disease, unspecified: Secondary | ICD-10-CM

## 2023-11-22 DIAGNOSIS — Z794 Long term (current) use of insulin: Secondary | ICD-10-CM

## 2023-11-22 DIAGNOSIS — I70222 Atherosclerosis of native arteries of extremities with rest pain, left leg: Secondary | ICD-10-CM

## 2023-11-22 DIAGNOSIS — I10 Essential (primary) hypertension: Secondary | ICD-10-CM | POA: Diagnosis not present

## 2023-11-22 DIAGNOSIS — Z9889 Other specified postprocedural states: Secondary | ICD-10-CM

## 2023-11-22 DIAGNOSIS — E1165 Type 2 diabetes mellitus with hyperglycemia: Secondary | ICD-10-CM

## 2023-11-22 MED ORDER — PREGABALIN 75 MG PO CAPS: 75.0000 mg | ORAL_CAPSULE | Freq: Two times a day (BID) | ORAL | 1 refills | Status: DC

## 2023-11-22 NOTE — Progress Notes (Signed)
 Subjective:    Patient ID: Wanda Gardner, female    DOB: [birth year], 83 y.o.   MRN: [id number] Chief Complaint  Patient presents with   Follow-up    Per GS consult + ABI    The patient returns to the office for followup and review status post angiogram with intervention on 08/292025.   Procedure: Procedure(s) Performed:             1.  Abdominal aortogram             2.  Bilateral distal runoff             3.  Percutaneous transluminal angioplasty and stent placement right common iliac artery; kissing balloon technique             4.  Percutaneous transluminal and plasty and stent placement left common iliac artery; kissing balloon technique             5.  Ultrasound guided access bilateral common femoral arteries             6.  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  She has noticed swelling in the left leg as well as some recent significant pain.  The patient is a somewhat poor historian and is unclear whether she is suffering from this.  Or possible neuropathic pain since her foot was ischemic.  There have been no significant changes to the patient's overall health care.  No documented history of amaurosis fugax or recent TIA symptoms. There are no recent neurological changes noted. No documented history of DVT, PE or superficial thrombophlebitis. The patient denies recent episodes of angina or shortness of breath.   ABI's Rt=0.99 and Lt=0.75  (previous ABI's Rt=0.84 and Lt=0.24) Duplex US  of the right lower extremity shows biphasic waveforms with slightly dampened toe waveforms but monophasic in the left    Review of Systems  Cardiovascular:  Positive for leg swelling.  Musculoskeletal:  Positive for arthralgias.  All other systems reviewed and are negative.      Objective:    Physical Exam Vitals reviewed.  HENT:     Head: Normocephalic.  Cardiovascular:     Rate and Rhythm: Normal rate.  Pulmonary:     Effort: Pulmonary effort is normal.  Musculoskeletal:     Left lower leg: Edema present.  Skin:    General: Skin is warm and dry.  Neurological:     Mental Status: She is alert and oriented to person, place, and time.  Psychiatric:        Mood and Affect: Mood normal.        Behavior: Behavior normal.        Thought Content: Thought content normal.        Judgment: Judgment normal.     BP (!) 100/58 (BP Location: Right Arm, Patient Position: Sitting, Cuff Size: Normal)   Pulse 65   Resp 18   Ht 5' 6 (1.676 m)   Wt 180 lb (81.6 kg)   BMI 29.05 kg/m   Past Medical History:  Diagnosis Date   Acid reflux    AKI (acute kidney injury) 06/10/2022   Arthritis    Asthma    Chest pain, unspecified 10/30/2013   Formatting of this note might be different from the original. Note: Unchanged Note: Unchanged Formatting of this note  might be different from the original. Note: Unchanged Note: Unchanged   Constipation 04/20/2011   Formatting of this note might be different from the original. Note: Unchanged - probably CIC. Note: Unchanged - probably C[facility].   COPD (chronic obstructive pulmonary disease) (HCC)    Cough 10/30/2013   Formatting of this note might be different from the original. Note: Unchanged Note: Unchanged   Depression    Diabetes mellitus (HCC) 01/10/2022   Diabetes mellitus without complication (HCC)    Dyspnea    Glaucoma    Headache    right side of head   Headache 07/25/2011   Hyperlipidemia    Hypertension    Knee stiff 05/12/2022   Lens replaced 03/22/2010   Neuropathic pain of hands and feet    PONV (postoperative nausea and vomiting) 1982   back with my hysterectomy.   Pseudophakia 03/22/2010   Sleep apnea    Doesn't use CPAP   Stiffness of shoulder joint 05/12/2022   Trigger finger of right hand 01/10/2022   Wears  dentures    full upper and lower    Social History   Socioeconomic History   Marital status: Divorced    Spouse name: Not on file   Number of children: Not on file   Years of education: Not on file   Highest education level: Not on file  Occupational History   Not on file  Tobacco Use   Smoking status: Every Day    Current packs/day: 0.50    Average packs/day: 0.5 packs/day for 50.0 years (25.0 ttl pk-yrs)    Types: Cigarettes   Smokeless tobacco: Never  Vaping Use   Vaping status: Never Used  Substance and Sexual Activity   Alcohol use: Yes    Alcohol/week: 0.0 standard drinks of alcohol    Comment: 1 glass of wine twice a year   Drug use: Yes    Types: Marijuana    Comment: weed- daily use for pain relief when she can get  it   Sexual activity: Not Currently  Other Topics Concern   Not on file  Social History Narrative   Not on file   Social Drivers of Health   Financial Resource Strain: High Risk (10/29/2023)   Overall Financial Resource Strain (CARDIA)    Difficulty of Paying Living Expenses: Hard  Food Insecurity: No Food Insecurity (10/24/2023)   Hunger Vital Sign    Worried About Running Out of Food in the Last Year: Never true    Ran Out of Food in the Last Year: Never true  Transportation Needs: No Transportation Needs (10/24/2023)   PRAPARE - Administrator, Civil Service (Medical): No    Lack of Transportation (Non-Medical): No  Physical Activity: Not on file  Stress: Not on file  Social Connections: Moderately Isolated (10/15/2023)   Social Connection and Isolation Panel    Frequency of Communication with Friends and Family: More than three times a week    Frequency of Social Gatherings with Friends and Family: More than three times a week    Attends Religious Services: 1 to 4 times per year    Active Member of [employer] or Organizations: No    Attends Banker Meetings: Never    Marital Status: Widowed  Intimate Partner Violence:  Not At Risk (10/24/2023)   Humiliation, Afraid, Rape, and Kick questionnaire    Fear of Current or Ex-Partner: No    Emotionally Abused: No    Physically Abused: No  Sexually Abused: No    Past Surgical History:  Procedure Laterality Date   ABDOMINAL HYSTERECTOMY     CATARACT EXTRACTION W/ INTRAOCULAR LENS  IMPLANT, BILATERAL     CATARACT EXTRACTION W/PHACO Left 12/14/2021   Procedure: CATARACT EXTRACTION PHACO AND INTRAOCULAR LENS PLACEMENT (IOC) LEFT DIABETIC 24.79 02:23.9;  Surgeon: Mittie Gaskin, MD;  Location: [facility];  Service: Ophthalmology;  Laterality: Left;  Diabetic   CHOLECYSTECTOMY     COLONOSCOPY WITH PROPOFOL  N/A 10/29/2014   Procedure: COLONOSCOPY WITH PROPOFOL ;  Surgeon: Deward CINDERELLA Piedmont, MD;  Location: [facility];  Service: Gastroenterology;  Laterality: N/A;   ESOPHAGOGASTRODUODENOSCOPY (EGD) WITH PROPOFOL  N/A 10/29/2014   Procedure: ESOPHAGOGASTRODUODENOSCOPY (EGD) WITH PROPOFOL ;  Surgeon: Deward CINDERELLA Piedmont, MD;  Location: [facility] ENDOSCOPY;  Service: Gastroenterology;  Laterality: N/A;   EYE SURGERY Bilateral 2011   lid lift done 3 times   FOOT SURGERY     HAND SURGERY     LOWER EXTREMITY ANGIOGRAPHY Left 10/19/2023   Procedure: Lower Extremity Angiography;  Surgeon: Jama Cordella MATSU, MD;  Location: [facility];  Service: Cardiovascular;  Laterality: Left;   LOWER EXTREMITY INTERVENTION Left 10/19/2023   Procedure: LOWER EXTREMITY INTERVENTION;  Surgeon: Jama Cordella MATSU, MD;  Location: [facility];  Service: Cardiovascular;  Laterality: Left;   TONGUE SURGERY     removal of cancer   TONSILLECTOMY     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/13/2016   Procedure: TOTAL HIP ARTHROPLASTY ANTERIOR APPROACH;  Surgeon: Ozell Flake, MD;  Location: [facility] ORS;  Service: Orthopedics;  Laterality: Right;   TOTAL KNEE ARTHROPLASTY Right 11/23/2015   Procedure: TOTAL KNEE ARTHROPLASTY;  Surgeon: Ozell Flake, MD;  Location: [facility] OR[facility];  Service: Orthopedics;   Laterality: Right;   VOCAL CORD LATERALIZATION, ENDOSCOPIC APPROACH W/ MLB      Family History  Problem Relation Age of Onset   Cancer Father    Heart disease Mother     No Known Allergies     Latest Ref Rng & Units 10/20/2023    5:15 AM 10/19/2023    9:16 AM 10/18/2023    1:57 AM  CBC  WBC 4.0 - 10.5 K/uL 6.2  5.0  7.3   Hemoglobin 12.0 - 15.0 g/dL 9.6  89.8  8.7   Hematocrit 36.0 - 46.0 % 30.4  32.2  27.5   Platelets 150 - 400 K/uL 399  395  402       CMP     Component Value Date/Time   NA 136 10/20/2023 0515   NA 141 12/14/2022 1032   NA 141 04/09/2011 0323   K 4.3 10/20/2023 0515   K 4.1 04/09/2011 0323   CL 100 10/20/2023 0515   CL 106 04/09/2011 0323   CO2 25 10/20/2023 0515   CO2 26 04/09/2011 0323   GLUCOSE 172 (H) 10/20/2023 0515   GLUCOSE 210 (H) 04/09/2011 0323   BUN 14 10/20/2023 0515   BUN 20 12/14/2022 1032   BUN 16 04/09/2011 0323   CREATININE 0.95 10/20/2023 0515   CREATININE 0.96 04/09/2011 0323   CALCIUM 8.6 (L) 10/20/2023 0515   CALCIUM 8.2 (L) 04/09/2011 0323   PROT 6.5 10/15/2023 1535   PROT 6.8 12/14/2022 1032   PROT 7.2 04/08/2011 1928   ALBUMIN 3.2 (L) 10/15/2023 1535   ALBUMIN 4.1 12/14/2022 1032   ALBUMIN 3.9 04/08/2011 1928   AST 18 10/15/2023 1535   AST 18 04/08/2011 1928   ALT 12 10/15/2023 1535   ALT 23 04/08/2011 1928  ALKPHOS 63 10/15/2023 1535   ALKPHOS 99 04/08/2011 1928   BILITOT 0.4 10/15/2023 1535   BILITOT 0.2 12/14/2022 1032   BILITOT 0.3 04/08/2011 1928   EGFR 44 (L) 12/14/2022 1032   GFRNONAA >60 10/20/2023 0515   GFRNONAA >60 04/09/2011 0323     No results found.     Assessment & Plan:   1. 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  We have also restarted her Lyrica  as she notes that it was helpful previously for nerve pain for her.  The patient was profoundly ischemic prior to intervention so she may have some  neuropathic pains that are actually causing some of her discomfort.  Recommend:  The patient has evidence of severe atherosclerotic changes of both lower extremities with rest pain that is associated with preulcerative changes and impending tissue loss of the left foot.  This represents a limb threatening ischemia and places the patient at the risk for left limb loss.  Patient should undergo angiography of the left lower extremity with the hope for intervention for limb salvage.  The risks and benefits as well as the alternative therapies was discussed in detail with the patient.  All questions were answered.  Patient agrees to proceed with left lower extremity angiography.  The patient will follow up with me in the office after the procedure.      2. Type 2 diabetes mellitus with hyperglycemia, with long-term current use of insulin  (HCC) Continue hypoglycemic medications as already ordered, these medications have been reviewed and there are no changes at this time.  Hgb A1C to be monitored as already arranged by primary service  3. Essential hypertension, benign Continue antihypertensive medications as already ordered, these medications have been reviewed and there are no changes at this time.   Current Outpatient Medications on File Prior to Visit  Medication Sig Dispense Refill   Accu-Chek Softclix Lancets lancets Use with device to check sugars twice daily 100 each 12   albuterol  (VENTOLIN  HFA) 108 (90 Base) MCG/ACT inhaler Inhale 2 puffs into the lungs every 6 (six) hours as needed for wheezing or shortness of breath. 8 g 2   apixaban  (ELIQUIS ) 5 MG TABS tablet Take 1 tablet (5 mg total) by mouth 2 (two) times daily. 60 tablet 0   aspirin  EC 81 MG tablet Take 1 tablet (81 mg total) by mouth daily. Swallow whole. 30 tablet 0   betamethasone , augmented, (DIPROLENE ) 0.05 % lotion Apply 1 Application topically 2 (two) times daily.     Cholecalciferol  (VITAMIN D3) 125 MCG (5000 UT) CAPS  Take 1 capsule (5,000 Units total) by mouth daily. 90 capsule 1   diclofenac  sodium (VOLTAREN ) 1 % GEL Apply 2 g topically 2 (two) times daily as needed (pain).      docusate sodium  (COLACE) 100 MG capsule Take 1 capsule (100 mg total) by mouth 2 (two) times daily. 60 capsule 0   dorzolamide -timolol  (COSOPT ) 2-0.5 % ophthalmic solution Place 1 drop into both eyes 2 (two) times daily.     doxepin  (SINEQUAN ) 10 MG capsule TAKE 1 CAPSULE BY MOUTH AT BEDTIME 30 capsule 0   DULoxetine  (CYMBALTA ) 60 MG capsule TAKE 1 CAPSULE BY MOUTH DAILY 90 capsule 2   esomeprazole  (NEXIUM ) 40 MG capsule Take 1 capsule (40 mg total) by mouth daily. 90 capsule 3   ferrous sulfate  325 (65 FE) MG tablet Take 325 mg by mouth daily with breakfast.  fluticasone  (FLONASE ) 50 MCG/ACT nasal spray Place 2 sprays into both nostrils daily as needed for allergies. 16 g 11   furosemide  (LASIX ) 20 MG tablet TAKE 1 TABLET BY MOUTH DAILY 90 tablet 1   glucose blood (ACCU-CHEK GUIDE TEST) test strip CHECK BLOOD SUGAR TWICE DAILY 200 each 1   insulin  glargine (LANTUS  SOLOSTAR) 100 UNIT/ML Solostar Pen Inject 10 Units into the skin at bedtime. 9 mL 2   insulin  lispro (HUMALOG ) 100 UNIT/ML KwikPen Inject 2 Units into the skin 3 (three) times daily with meals. If eating and Blood Glucose (BG) 80 or higher inject 2 units for meal coverage and add correction dose per scale. If not eating, correction dose only. BG <150= 0 unit; BG 150-200= 1 unit; BG 201-250= 2 unit; BG 251-300= 3 unit; BG 301-350= 4 unit; BG 351-400= 5 unit; BG >400= 6 unit and Call Primary Care.     Insulin  Pen Needle (BD PEN NEEDLE NANO U/F) 32G X 4 MM MISC 1 each by Does not apply route in the morning, at noon, in the evening, and at bedtime. 200 each 11   polyethylene glycol (MIRALAX  / GLYCOLAX ) 17 g packet Take 17 g by mouth daily. 30 each 0   potassium chloride  SA (K-DUR,KLOR-CON ) 20 MEQ tablet Take 20 mEq by mouth daily.     pravastatin  (PRAVACHOL ) 40 MG tablet TAKE 1  TABLET BY MOUTH DAILY 90 tablet 3   vitamin B-12 (CYANOCOBALAMIN ) 1000 MCG tablet Take 1,000 mcg by mouth daily.     hydrOXYzine  (ATARAX ) 25 MG tablet Take 25 mg by mouth at bedtime as needed (insomnia). (Patient not taking: Reported on 11/13/2023)     lactulose  (CHRONULAC ) 10 GM/15ML solution Take 15 mLs (10 g total) by mouth 2 (two) times daily as needed for mild constipation, moderate constipation or severe constipation. (Patient not taking: Reported on 11/06/2023) 236 mL 0   losartan  (COZAAR ) 50 MG tablet TAKE 1 TABLET BY MOUTH DAILY 90 tablet 1   oxyCODONE  (OXY IR/ROXICODONE ) 5 MG immediate release tablet Take 1 tablet (5 mg total) by mouth every 6 (six) hours as needed for severe pain (pain score 7-10). (Patient not taking: Reported on 11/06/2023) 10 tablet 0   No current facility-administered medications on file prior to visit.    There are no Patient Instructions on file for this visit. No follow-ups on file.   Maryfrances Portugal E Aloys Hupfer, NP

## 2023-11-23 DIAGNOSIS — H1811 Bullous keratopathy, right eye: Secondary | ICD-10-CM | POA: Diagnosis not present

## 2023-11-23 DIAGNOSIS — Z961 Presence of intraocular lens: Secondary | ICD-10-CM | POA: Diagnosis not present

## 2023-11-23 DIAGNOSIS — H4051X3 Glaucoma secondary to other eye disorders, right eye, severe stage: Secondary | ICD-10-CM | POA: Diagnosis not present

## 2023-11-23 DIAGNOSIS — H401121 Primary open-angle glaucoma, left eye, mild stage: Secondary | ICD-10-CM | POA: Diagnosis not present

## 2023-11-23 DIAGNOSIS — Z0100 Encounter for examination of eyes and vision without abnormal findings: Secondary | ICD-10-CM | POA: Diagnosis not present

## 2023-11-23 LAB — VAS US ABI WITH/WO TBI
Left ABI: 0.75
Right ABI: 0.99

## 2023-11-26 ENCOUNTER — Telehealth (INDEPENDENT_AMBULATORY_CARE_PROVIDER_SITE_OTHER): Payer: Self-pay

## 2023-11-26 NOTE — Telephone Encounter (Signed)
 I attempted to contact the patient to schedule her fora LLE angio with Dr. Dreama. I was unable to leave a message.

## 2023-11-27 ENCOUNTER — Encounter: Payer: Self-pay | Admitting: Family

## 2023-11-27 ENCOUNTER — Ambulatory Visit (INDEPENDENT_AMBULATORY_CARE_PROVIDER_SITE_OTHER): Admitting: Family

## 2023-11-27 ENCOUNTER — Other Ambulatory Visit: Payer: Self-pay

## 2023-11-27 VITALS — BP 122/60 | HR 83 | Ht 66.0 in | Wt 186.2 lb

## 2023-11-27 DIAGNOSIS — E782 Mixed hyperlipidemia: Secondary | ICD-10-CM

## 2023-11-27 DIAGNOSIS — E1165 Type 2 diabetes mellitus with hyperglycemia: Secondary | ICD-10-CM | POA: Diagnosis not present

## 2023-11-27 DIAGNOSIS — E559 Vitamin D deficiency, unspecified: Secondary | ICD-10-CM

## 2023-11-27 DIAGNOSIS — Z794 Long term (current) use of insulin: Secondary | ICD-10-CM

## 2023-11-27 DIAGNOSIS — I10 Essential (primary) hypertension: Secondary | ICD-10-CM | POA: Diagnosis not present

## 2023-11-27 DIAGNOSIS — N1831 Chronic kidney disease, stage 3a: Secondary | ICD-10-CM | POA: Diagnosis not present

## 2023-11-27 DIAGNOSIS — N3942 Incontinence without sensory awareness: Secondary | ICD-10-CM

## 2023-11-27 MED ORDER — DEXCOM G7 SENSOR MISC: 2 refills | Status: DC

## 2023-11-27 NOTE — Progress Notes (Signed)
 Established Patient Office Visit  Subjective:  Patient ID: Wanda Gardner, female    DOB: September 28, 1940  Age: 83 y.o. MRN: [id number]  Chief Complaint  Patient presents with   Follow-up    Hospital follow up    Patient is here today for hospital follow up.  She has been feeling tired since her hospital visit, but says that she thinks her blood sugars have been better controlled.   She was admitted with severe hyperglycemia.   No other concerns today    No other concerns at this time.   Past Medical History:  Diagnosis Date   Acid reflux    AKI (acute kidney injury) 06/10/2022   Arthritis    Asthma    Chest pain, unspecified 10/30/2013   Formatting of this note might be different from the original. Note: Unchanged Note: Unchanged Formatting of this note might be different from the original. Note: Unchanged Note: Unchanged   Constipation 04/20/2011   Formatting of this note might be different from the original. Note: Unchanged - probably CIC. Note: Unchanged - probably CIC.   COPD (chronic obstructive pulmonary disease) (HCC)    Cough 10/30/2013   Formatting of this note might be different from the original. Note: Unchanged Note: Unchanged   Depression    Diabetes mellitus (HCC) 01/10/2022   Diabetes mellitus without complication (HCC)    Dyspnea    Glaucoma    Headache    right side of head   Headache 07/25/2011   Hyperlipidemia    Hypertension    Knee stiff 05/12/2022   Lens replaced 03/22/2010   Neuropathic pain of hands and feet    PONV (postoperative nausea and vomiting) 1982   back with my hysterectomy.   Pseudophakia 03/22/2010   Sleep apnea    Doesn't use CPAP   Stiffness of shoulder joint 05/12/2022   Trigger finger of right hand 01/10/2022   Wears dentures    full upper and lower    Past Surgical History:  Procedure Laterality Date   ABDOMINAL HYSTERECTOMY     CATARACT EXTRACTION W/ INTRAOCULAR LENS  IMPLANT, BILATERAL     CATARACT EXTRACTION  W/PHACO Left 12/14/2021   Procedure: CATARACT EXTRACTION PHACO AND INTRAOCULAR LENS PLACEMENT (IOC) LEFT DIABETIC 24.79 02:23.9;  Surgeon: Mittie Gaskin, MD;  Location: [facility];  Service: Ophthalmology;  Laterality: Left;  Diabetic   CHOLECYSTECTOMY     COLONOSCOPY WITH PROPOFOL  N/A 10/29/2014   Procedure: COLONOSCOPY WITH PROPOFOL ;  Surgeon: Deward CINDERELLA Piedmont, MD;  Location: [facility];  Service: Gastroenterology;  Laterality: N/A;   ESOPHAGOGASTRODUODENOSCOPY (EGD) WITH PROPOFOL  N/A 10/29/2014   Procedure: ESOPHAGOGASTRODUODENOSCOPY (EGD) WITH PROPOFOL ;  Surgeon: Deward CINDERELLA Piedmont, MD;  Location: [facility] ENDOSCOPY;  Service: Gastroenterology;  Laterality: N/A;   EYE SURGERY Bilateral 2011   lid lift done 3 times   FOOT SURGERY     HAND SURGERY     LOWER EXTREMITY ANGIOGRAPHY Left 10/19/2023   Procedure: Lower Extremity Angiography;  Surgeon: Jama Cordella MATSU, MD;  Location: [facility];  Service: Cardiovascular;  Laterality: Left;   LOWER EXTREMITY INTERVENTION Left 10/19/2023   Procedure: LOWER EXTREMITY INTERVENTION;  Surgeon: Jama Cordella MATSU, MD;  Location: [facility];  Service: Cardiovascular;  Laterality: Left;   TONGUE SURGERY     removal of cancer   TONSILLECTOMY     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/13/2016   Procedure: TOTAL HIP ARTHROPLASTY ANTERIOR APPROACH;  Surgeon: Ozell Flake, MD;  Location: [facility] OR[facility];  Service:  Orthopedics;  Laterality: Right;   TOTAL KNEE ARTHROPLASTY Right 11/23/2015   Procedure: TOTAL KNEE ARTHROPLASTY;  Surgeon: Ozell Flake, MD;  Location: [facility] OR[facility];  Service: Orthopedics;  Laterality: Right;   VOCAL CORD LATERALIZATION, ENDOSCOPIC APPROACH W/ MLB      Social History   Socioeconomic History   Marital status: Divorced    Spouse name: Not on file   Number of children: Not on file   Years of education: Not on file   Highest education level: Not on file  Occupational History   Not on file  Tobacco Use   Smoking status: Every Day     Current packs/day: 0.50    Average packs/day: 0.5 packs/day for 50.0 years (25.0 ttl pk-yrs)    Types: Cigarettes   Smokeless tobacco: Never  Vaping Use   Vaping status: Never Used  Substance and Sexual Activity   Alcohol use: Yes    Alcohol/week: 0.0 standard drinks of alcohol    Comment: 1 glass of wine twice a year   Drug use: Yes    Types: Marijuana    Comment: weed- daily use for pain relief when she can get  it   Sexual activity: Not Currently  Other Topics Concern   Not on file  Social History Narrative   Not on file   Social Drivers of Health   Financial Resource Strain: High Risk (10/29/2023)   Overall Financial Resource Strain (CARDIA)    Difficulty of Paying Living Expenses: Hard  Food Insecurity: No Food Insecurity (10/24/2023)   Hunger Vital Sign    Worried About Running Out of Food in the Last Year: Never true    Ran Out of Food in the Last Year: Never true  Transportation Needs: No Transportation Needs (10/24/2023)   PRAPARE - Administrator, Civil Service (Medical): No    Lack of Transportation (Non-Medical): No  Physical Activity: Not on file  Stress: Not on file  Social Connections: Moderately Isolated (10/15/2023)   Social Connection and Isolation Panel    Frequency of Communication with Friends and Family: More than three times a week    Frequency of Social Gatherings with Friends and Family: More than three times a week    Attends Religious Services: 1 to 4 times per year    Active Member of [employer] or Organizations: No    Attends Banker Meetings: Never    Marital Status: Widowed  Intimate Partner Violence: Not At Risk (10/24/2023)   Humiliation, Afraid, Rape, and Kick questionnaire    Fear of Current or Ex-Partner: No    Emotionally Abused: No    Physically Abused: No    Sexually Abused: No    Family History  Problem Relation Age of Onset   Cancer Father    Heart disease Mother     No Known Allergies  Review of  Systems  Eyes:  Positive for blurred vision.  Neurological:  Positive for weakness.  All other systems reviewed and are negative.      Objective:   BP 122/60   Pulse 83   Ht 5' 6 (1.676 m)   SpO2 99%   BMI 29.05 kg/m   Vitals:   11/27/23 1106  BP: 122/60  Pulse: 83  Height: 5' 6 (1.676 m)  SpO2: 99%    Physical Exam Vitals and nursing note reviewed.  Constitutional:      Appearance: Normal appearance. She is normal weight.  HENT:     Head: Normocephalic.  Eyes:  Extraocular Movements: Extraocular movements intact.     Conjunctiva/sclera: Conjunctivae normal.     Pupils: Pupils are equal, round, and reactive to light.     Comments: Bilateral cataracts  Cardiovascular:     Rate and Rhythm: Normal rate.  Pulmonary:     Effort: Pulmonary effort is normal.  Neurological:     General: No focal deficit present.     Mental Status: She is alert and oriented to person, place, and time. Mental status is at baseline.     Motor: Weakness present.  Psychiatric:        Mood and Affect: Mood normal.        Behavior: Behavior normal.        Thought Content: Thought content normal.      No results found for any visits on 11/27/23.  Recent Results (from the past 2160 hours)  VAS US  ABI WITH/WO TBI     Status: None   Collection Time: 09/19/23  4:01 PM  Result Value Ref Range   Right ABI 0.84    Left ABI 0.24   CBC     Status: Abnormal   Collection Time: 10/15/23  3:35 PM  Result Value Ref Range   WBC 7.4 4.0 - 10.5 K/uL   RBC 3.74 (L) 3.87 - 5.11 MIL/uL   Hemoglobin 8.2 (L) 12.0 - 15.0 g/dL    Comment: Reticulocyte Hemoglobin testing may be clinically indicated, consider ordering this additional test [id number]    HCT 27.6 (L) 36.0 - 46.0 %   MCV 73.8 (L) 80.0 - 100.0 fL   MCH 21.9 (L) 26.0 - 34.0 pg   MCHC 29.7 (L) 30.0 - 36.0 g/dL   RDW 79.1 (H) 88.4 - 84.4 %   Platelets 557 (H) 150 - 400 K/uL   nRBC 0.0 0.0 - 0.2 %    Comment: Performed at [facility], [address] [place], [place] [address]  Comprehensive metabolic panel     Status: Abnormal   Collection Time: 10/15/23  3:35 PM  Result Value Ref Range   Sodium 139 135 - 145 mmol/L   Potassium 4.0 3.5 - 5.1 mmol/L   Chloride 105 98 - 111 mmol/L   CO2 25 22 - 32 mmol/L   Glucose, Bld 164 (H) 70 - 99 mg/dL    Comment: Glucose reference range applies only to samples taken after fasting for at least 8 hours.   BUN 14 8 - 23 mg/dL   Creatinine, Ser 9.02 0.44 - 1.00 mg/dL   Calcium 8.5 (L) 8.9 - 10.3 mg/dL   Total Protein 6.5 6.5 - 8.1 g/dL   Albumin 3.2 (L) 3.5 - 5.0 g/dL   AST 18 15 - 41 U/L   ALT 12 0 - 44 U/L   Alkaline Phosphatase 63 38 - 126 U/L   Total Bilirubin 0.4 0.0 - 1.2 mg/dL   GFR, Estimated 59 (L) >60 mL/min    Comment: (NOTE) Calculated using the CKD-EPI Creatinine Equation (2021)    Anion gap 9 5 - 15    Comment: Performed at [facility], [address] [place], [place] [address]  CK     Status: None   Collection Time: 10/15/23  3:35 PM  Result Value Ref Range   Total CK 103 38 - 234 U/L    Comment: Performed at [facility], [address], [place], [place] [address]  Troponin I (High Sensitivity)     Status: None   Collection Time: 10/15/23  3:35 PM  Result Value Ref Range   Troponin I (High Sensitivity) 6 <18 ng/L    Comment: (NOTE) Elevated high sensitivity troponin I (hsTnI) values and significant  changes across serial measurements may suggest ACS but many other  chronic and acute conditions are known to elevate hsTnI results.  Refer to the Links section for chest pain algorithms and additional  guidance. Performed at [facility], [address] [place], [place] [address]   Urinalysis, Routine w reflex microscopic -Urine, Clean Catch     Status: Abnormal   Collection Time: 10/15/23  4:08 PM  Result Value Ref Range   Color, Urine STRAW (A) YELLOW   APPearance CLEAR (A) CLEAR   Specific Gravity,  Urine 1.004 (L) 1.005 - 1.030   pH 6.0 5.0 - 8.0   Glucose, UA 50 (A) NEGATIVE mg/dL   Hgb urine dipstick NEGATIVE NEGATIVE   Bilirubin Urine NEGATIVE NEGATIVE   Ketones, ur NEGATIVE NEGATIVE mg/dL   Protein, ur NEGATIVE NEGATIVE mg/dL   Nitrite POSITIVE (A) NEGATIVE   Leukocytes,Ua TRACE (A) NEGATIVE   RBC / HPF 0-5 0 - 5 RBC/hpf   WBC, UA 0-5 0 - 5 WBC/hpf   Bacteria, UA RARE (A) NONE SEEN   Squamous Epithelial / HPF 0 0 - 5 /HPF   Mucus PRESENT     Comment: Performed at [facility], [address] [place], [place] [address]  Urine Culture (for pregnant, neutropenic or urologic patients or patients with an indwelling urinary catheter)     Status: Abnormal   Collection Time: 10/15/23  4:08 PM   Specimen: Urine, Clean Catch  Result Value Ref Range   Specimen Description      URINE, CLEAN CATCH Performed at [facility], [address], [place], [place] [address]    Special Requests      NONE Performed at [facility], [address] [place], [place] [address]    Culture >=100,000 COLONIES/mL ESCHERICHIA COLI (A)    Report Status 10/18/2023 FINAL    Organism ID, Bacteria ESCHERICHIA COLI (A)       Susceptibility   Escherichia coli - MIC*    AMPICILLIN >=32 RESISTANT Resistant     CEFAZOLIN  (URINE) Value in next row Resistant      >=32 RESISTANTThis is a modified FDA-approved test that has been validated and its performance characteristics determined by the reporting laboratory.  This laboratory is certified under the Clinical Laboratory Improvement Amendments CLIA as qualified to perform high complexity clinical laboratory testing.    CEFEPIME Value in next row Sensitive      >=32 RESISTANTThis is a modified FDA-approved test that has been validated and its performance characteristics determined by the reporting laboratory.  This laboratory is certified under the Clinical Laboratory Improvement Amendments CLIA as qualified to perform high complexity  clinical laboratory testing.    ERTAPENEM Value in next row Sensitive      >=32 RESISTANTThis is a modified FDA-approved test that has been validated and its performance characteristics determined by the reporting laboratory.  This laboratory is certified under the Clinical Laboratory Improvement Amendments CLIA as qualified to perform high complexity clinical laboratory testing.    CEFTRIAXONE  Value in next row Resistant      >=32 RESISTANTThis is a modified FDA-approved test that has been validated and its performance characteristics determined by the reporting laboratory.  This laboratory is certified under the Clinical Laboratory Improvement Amendments CLIA as qualified to perform high complexity clinical laboratory testing.  CIPROFLOXACIN  Value in next row Sensitive      >=32 RESISTANTThis is a modified FDA-approved test that has been validated and its performance characteristics determined by the reporting laboratory.  This laboratory is certified under the Clinical Laboratory Improvement Amendments CLIA as qualified to perform high complexity clinical laboratory testing.    GENTAMICIN Value in next row Sensitive      >=32 RESISTANTThis is a modified FDA-approved test that has been validated and its performance characteristics determined by the reporting laboratory.  This laboratory is certified under the Clinical Laboratory Improvement Amendments CLIA as qualified to perform high complexity clinical laboratory testing.    NITROFURANTOIN  Value in next row Sensitive      >=32 RESISTANTThis is a modified FDA-approved test that has been validated and its performance characteristics determined by the reporting laboratory.  This laboratory is certified under the Clinical Laboratory Improvement Amendments CLIA as qualified to perform high complexity clinical laboratory testing.    TRIMETH/SULFA Value in next row Sensitive      >=32 RESISTANTThis is a modified FDA-approved test that has been validated  and its performance characteristics determined by the reporting laboratory.  This laboratory is certified under the Clinical Laboratory Improvement Amendments CLIA as qualified to perform high complexity clinical laboratory testing.    AMPICILLIN/SULBACTAM Value in next row Intermediate      >=32 RESISTANTThis is a modified FDA-approved test that has been validated and its performance characteristics determined by the reporting laboratory.  This laboratory is certified under the Clinical Laboratory Improvement Amendments CLIA as qualified to perform high complexity clinical laboratory testing.    PIP/TAZO Value in next row Sensitive ug/mL     <=4 SENSITIVEThis is a modified FDA-approved test that has been validated and its performance characteristics determined by the reporting laboratory.  This laboratory is certified under the Clinical Laboratory Improvement Amendments CLIA as qualified to perform high complexity clinical laboratory testing.    MEROPENEM Value in next row Sensitive      <=4 SENSITIVEThis is a modified FDA-approved test that has been validated and its performance characteristics determined by the reporting laboratory.  This laboratory is certified under the Clinical Laboratory Improvement Amendments CLIA as qualified to perform high complexity clinical laboratory testing.    * >=100,000 COLONIES/mL ESCHERICHIA COLI  POC CBG, ED     Status: Abnormal   Collection Time: 10/15/23  7:41 PM  Result Value Ref Range   Glucose-Capillary 15 (LL) 70 - 99 mg/dL    Comment: Glucose reference range applies only to samples taken after fasting for at least 8 hours.   Comment 1 Notify RN    Comment 2 Document in Chart   CBG monitoring, ED     Status: None   Collection Time: 10/15/23  7:46 PM  Result Value Ref Range   Glucose-Capillary 82 70 - 99 mg/dL    Comment: Glucose reference range applies only to samples taken after fasting for at least 8 hours.  Glucose, capillary     Status: Abnormal    Collection Time: 10/15/23  9:33 PM  Result Value Ref Range   Glucose-Capillary 55 (L) 70 - 99 mg/dL    Comment: Glucose reference range applies only to samples taken after fasting for at least 8 hours.  Glucose, capillary     Status: Abnormal   Collection Time: 10/15/23  9:49 PM  Result Value Ref Range   Glucose-Capillary 50 (L) 70 - 99 mg/dL    Comment: Glucose reference range applies only to samples  taken after fasting for at least 8 hours.  Troponin I (High Sensitivity)     Status: None   Collection Time: 10/15/23 10:09 PM  Result Value Ref Range   Troponin I (High Sensitivity) 9 <18 ng/L    Comment: (NOTE) Elevated high sensitivity troponin I (hsTnI) values and significant  changes across serial measurements may suggest ACS but many other  chronic and acute conditions are known to elevate hsTnI results.  Refer to the Links section for chest pain algorithms and additional  guidance. Performed at [facility], [address] [place], [place] [address]   Hemoglobin A1c     Status: Abnormal   Collection Time: 10/15/23 10:09 PM  Result Value Ref Range   Hgb A1c MFr Bld 6.5 (H) 4.8 - 5.6 %    Comment: (NOTE) Diagnosis of Diabetes The following HbA1c ranges recommended by the American Diabetes Association (ADA) may be used as an aid in the diagnosis of diabetes mellitus.  Hemoglobin             Suggested A1C NGSP%              Diagnosis  <5.7                   Non Diabetic  5.7-6.4                Pre-Diabetic  >6.4                   Diabetic  <7.0                   Glycemic control for                       adults with diabetes.     Mean Plasma Glucose 139.85 mg/dL    Comment: Performed at [facility], [address] [place], [place] [address]  Vitamin B12     Status: None   Collection Time: 10/15/23 10:09 PM  Result Value Ref Range   Vitamin B-12 399 180 - 914 pg/mL    Comment: (NOTE) This assay is not validated for testing neonatal  or myeloproliferative syndrome specimens for Vitamin B12 levels. Performed at [facility], [address] [place], [place] [address]   Glucose, capillary     Status: None   Collection Time: 10/15/23 10:27 PM  Result Value Ref Range   Glucose-Capillary 91 70 - 99 mg/dL    Comment: Glucose reference range applies only to samples taken after fasting for at least 8 hours.  Basic metabolic panel     Status: Abnormal   Collection Time: 10/16/23  4:31 AM  Result Value Ref Range   Sodium 138 135 - 145 mmol/L   Potassium 3.4 (L) 3.5 - 5.1 mmol/L   Chloride 105 98 - 111 mmol/L   CO2 26 22 - 32 mmol/L   Glucose, Bld 192 (H) 70 - 99 mg/dL    Comment: Glucose reference range applies only to samples taken after fasting for at least 8 hours.   BUN 10 8 - 23 mg/dL   Creatinine, Ser 9.11 0.44 - 1.00 mg/dL   Calcium 8.5 (L) 8.9 - 10.3 mg/dL   GFR, Estimated >39 >39 mL/min    Comment: (NOTE) Calculated using the CKD-EPI Creatinine Equation (2021)    Anion gap 7 5 - 15    Comment: Performed at [facility], [address] [place],  [place] [address]  CBC     Status: Abnormal   Collection Time: 10/16/23  4:31 AM  Result Value Ref Range   WBC 6.4 4.0 - 10.5 K/uL   RBC 3.12 (L) 3.87 - 5.11 MIL/uL   Hemoglobin 6.9 (L) 12.0 - 15.0 g/dL    Comment: Reticulocyte Hemoglobin testing may be clinically indicated, consider ordering this additional test [id number]    HCT 22.4 (L) 36.0 - 46.0 %   MCV 71.8 (L) 80.0 - 100.0 fL   MCH 22.1 (L) 26.0 - 34.0 pg   MCHC 30.8 30.0 - 36.0 g/dL   RDW 79.5 (H) 88.4 - 84.4 %   Platelets 447 (H) 150 - 400 K/uL   nRBC 0.0 0.0 - 0.2 %    Comment: Performed at [facility], [address] [place], [place] [address]  Iron  and TIBC     Status: Abnormal   Collection Time: 10/16/23  4:31 AM  Result Value Ref Range   Iron  11 (L) 28 - 170 ug/dL   TIBC 624 749 - 549 ug/dL   Saturation Ratios 3 (L) 10.4 - 31.8 %   UIBC 364 ug/dL    Comment:  Performed at [facility], [address] [place], [place] [address]  Ferritin     Status: Abnormal   Collection Time: 10/16/23  4:31 AM  Result Value Ref Range   Ferritin 4 (L) 11 - 307 ng/mL    Comment: Performed at [facility], [address] [place], [place] [address]  Folate     Status: None   Collection Time: 10/16/23  4:31 AM  Result Value Ref Range   Folate 30.0 >5.9 ng/mL    Comment: Performed at [facility], [address] [place], [place] [address]  Retic Panel     Status: Abnormal   Collection Time: 10/16/23  4:31 AM  Result Value Ref Range   Retic Ct Pct 2.0 0.4 - 3.1 %   RBC. 3.10 (L) 3.87 - 5.11 MIL/uL   Retic Count, Absolute 60.5 19.0 - 186.0 K/uL   Immature Retic Fract 24.6 (H) 2.3 - 15.9 %   Reticulocyte Hemoglobin 22.3 (L) >27.9 pg    Comment:        A RET-He < 28 pg is an indication of iron -deficient or iron - insufficient erythropoiesis. Patients with thalassemia may also have a decreased RET-He result unrelated to iron  availability.     If this patient has chronic kidney disease and does not have a hemoglobinopathy he/she meets criteria for iron  deficiency per the 2016 NICE guidelines. Refer to specific guidelines to determine the appropriate thresholds for treating CKD- associated iron  deficiency. TSAT and ferritin should be used in patients with hemoglobinopathies (e.g. thalassemia). Performed at [facility], [address] [place], [place] [address]   Glucose, capillary     Status: Abnormal   Collection Time: 10/16/23  7:41 AM  Result Value Ref Range   Glucose-Capillary 202 (H) 70 - 99 mg/dL    Comment: Glucose reference range applies only to samples taken after fasting for at least 8 hours.   Comment 1 Notify RN    Comment 2 Document in Chart   Glucose, capillary     Status: Abnormal   Collection Time: 10/16/23 12:59 PM  Result Value Ref Range   Glucose-Capillary 263 (H) 70 - 99 mg/dL     Comment: Glucose reference range applies only to samples taken after fasting for at least 8 hours.   Comment  1 Notify RN    Comment 2 Document in Chart   Hemoglobin and hematocrit, blood     Status: Abnormal   Collection Time: 10/16/23  1:29 PM  Result Value Ref Range   Hemoglobin 6.5 (L) 12.0 - 15.0 g/dL   HCT 78.7 (L) 63.9 - 53.9 %    Comment: Performed at [facility], [address], [place], [place] [address]  Prepare RBC (crossmatch)     Status: None   Collection Time: 10/16/23  2:48 PM  Result Value Ref Range   Order Confirmation      ORDER PROCESSED BY BLOOD BANK Performed at [facility], [address] [place], [place] [address]   Glucose, capillary     Status: Abnormal   Collection Time: 10/16/23  3:53 PM  Result Value Ref Range   Glucose-Capillary 226 (H) 70 - 99 mg/dL    Comment: Glucose reference range applies only to samples taken after fasting for at least 8 hours.  Type and screen [facility]     Status: None   Collection Time: 10/16/23  5:19 PM  Result Value Ref Range   ABO/RH(D) O POS    Antibody Screen NEG    Sample Expiration 10/19/2023,2359    Unit Number [id number]    Blood Component Type RED CELLS,LR    Unit division 00    Status of Unit ISSUED,FINAL    Transfusion Status OK TO TRANSFUSE    Crossmatch Result      Compatible Performed at [facility], [address] [place], [place] [address]   BPAM RBC     Status: None   Collection Time: 10/16/23  5:19 PM  Result Value Ref Range   ISSUE DATE / TIME [id number]    Blood Product Unit Number [id number]    PRODUCT CODE [id number]    Unit Type and Rh 5100    Blood Product Expiration Date [id number]   Glucose, capillary     Status: Abnormal   Collection Time: 10/16/23  7:36 PM  Result Value Ref Range   Glucose-Capillary 280 (H) 70 - 99 mg/dL    Comment: Glucose reference range applies only to samples taken after fasting for at least 8 hours.   Glucose, capillary     Status: Abnormal   Collection Time: 10/17/23 12:11 AM  Result Value Ref Range   Glucose-Capillary 208 (H) 70 - 99 mg/dL    Comment: Glucose reference range applies only to samples taken after fasting for at least 8 hours.  CBC     Status: Abnormal   Collection Time: 10/17/23  4:18 AM  Result Value Ref Range   WBC 6.5 4.0 - 10.5 K/uL   RBC 3.72 (L) 3.87 - 5.11 MIL/uL   Hemoglobin 8.8 (L) 12.0 - 15.0 g/dL    Comment: REPEATED TO VERIFY Reticulocyte Hemoglobin testing may be clinically indicated, consider ordering this additional test [id number]    HCT 27.5 (L) 36.0 - 46.0 %   MCV 73.9 (L) 80.0 - 100.0 fL   MCH 23.7 (L) 26.0 - 34.0 pg   MCHC 32.0 30.0 - 36.0 g/dL   RDW 79.1 (H) 88.4 - 84.4 %   Platelets 419 (H) 150 - 400 K/uL   nRBC 0.0 0.0 - 0.2 %    Comment: Performed at [facility], [address], [place], [place] [address]  Basic metabolic panel with GFR     Status: Abnormal   Collection Time: 10/17/23  4:18 AM  Result  Value Ref Range   Sodium 137 135 - 145 mmol/L   Potassium 3.8 3.5 - 5.1 mmol/L   Chloride 105 98 - 111 mmol/L   CO2 27 22 - 32 mmol/L   Glucose, Bld 201 (H) 70 - 99 mg/dL    Comment: Glucose reference range applies only to samples taken after fasting for at least 8 hours.   BUN 8 8 - 23 mg/dL   Creatinine, Ser 8.97 (H) 0.44 - 1.00 mg/dL   Calcium 8.6 (L) 8.9 - 10.3 mg/dL   GFR, Estimated 55 (L) >60 mL/min    Comment: (NOTE) Calculated using the CKD-EPI Creatinine Equation (2021)    Anion gap 5 5 - 15    Comment: Performed at [facility], [address] [place], [place] [address]  Magnesium      Status: None   Collection Time: 10/17/23  4:18 AM  Result Value Ref Range   Magnesium  1.9 1.7 - 2.4 mg/dL    Comment: Performed at [facility], [address] [place], [place] [address]  Glucose, capillary     Status: Abnormal   Collection Time: 10/17/23  5:10 AM  Result Value Ref Range    Glucose-Capillary 195 (H) 70 - 99 mg/dL    Comment: Glucose reference range applies only to samples taken after fasting for at least 8 hours.  Glucose, capillary     Status: Abnormal   Collection Time: 10/17/23  8:24 AM  Result Value Ref Range   Glucose-Capillary 189 (H) 70 - 99 mg/dL    Comment: Glucose reference range applies only to samples taken after fasting for at least 8 hours.  Glucose, capillary     Status: Abnormal   Collection Time: 10/17/23 12:15 PM  Result Value Ref Range   Glucose-Capillary 180 (H) 70 - 99 mg/dL    Comment: Glucose reference range applies only to samples taken after fasting for at least 8 hours.  Glucose, capillary     Status: Abnormal   Collection Time: 10/17/23  4:41 PM  Result Value Ref Range   Glucose-Capillary 208 (H) 70 - 99 mg/dL    Comment: Glucose reference range applies only to samples taken after fasting for at least 8 hours.  Heparin  level (unfractionated)     Status: Abnormal   Collection Time: 10/17/23  4:48 PM  Result Value Ref Range   Heparin  Unfractionated 0.79 (H) 0.30 - 0.70 IU/mL    Comment: (NOTE) The clinical reportable range upper limit is being lowered to >1.10 to align with the FDA approved guidance for the current laboratory assay.  If heparin  results are below expected values, and patient dosage has  been confirmed, suggest follow up testing of antithrombin III levels. Performed at [facility], [address] [place], [place] [address]   APTT     Status: Abnormal   Collection Time: 10/17/23  4:48 PM  Result Value Ref Range   aPTT 39 (H) 24 - 36 seconds    Comment:        IF BASELINE aPTT IS ELEVATED, SUGGEST PATIENT RISK ASSESSMENT BE USED TO DETERMINE APPROPRIATE ANTICOAGULANT THERAPY. Performed at [facility], [address] [place], [place] [address]   Glucose, capillary     Status: Abnormal   Collection Time: 10/17/23  6:11 PM  Result Value Ref Range   Glucose-Capillary 170 (H) 70 - 99  mg/dL    Comment: Glucose reference range applies only to samples taken after fasting for at least 8 hours.  Glucose, capillary     Status: Abnormal   Collection Time: 10/17/23  8:00 PM  Result Value Ref Range   Glucose-Capillary 269 (H) 70 - 99 mg/dL    Comment: Glucose reference range applies only to samples taken after fasting for at least 8 hours.  Glucose, capillary     Status: Abnormal   Collection Time: 10/18/23  1:47 AM  Result Value Ref Range   Glucose-Capillary 229 (H) 70 - 99 mg/dL    Comment: Glucose reference range applies only to samples taken after fasting for at least 8 hours.  CBC     Status: Abnormal   Collection Time: 10/18/23  1:57 AM  Result Value Ref Range   WBC 7.3 4.0 - 10.5 K/uL   RBC 3.71 (L) 3.87 - 5.11 MIL/uL   Hemoglobin 8.7 (L) 12.0 - 15.0 g/dL    Comment: Reticulocyte Hemoglobin testing may be clinically indicated, consider ordering this additional test [id number]    HCT 27.5 (L) 36.0 - 46.0 %   MCV 74.1 (L) 80.0 - 100.0 fL   MCH 23.5 (L) 26.0 - 34.0 pg   MCHC 31.6 30.0 - 36.0 g/dL   RDW 79.3 (H) 88.4 - 84.4 %   Platelets 402 (H) 150 - 400 K/uL   nRBC 0.0 0.0 - 0.2 %    Comment: Performed at [facility], [address], [place], [place] [address]  Uric acid     Status: None   Collection Time: 10/18/23  1:57 AM  Result Value Ref Range   Uric Acid, Serum 5.4 2.5 - 7.1 mg/dL    Comment: Performed at [facility], [address], [place], [place] [address]  Basic metabolic panel     Status: Abnormal   Collection Time: 10/18/23  1:57 AM  Result Value Ref Range   Sodium 136 135 - 145 mmol/L   Potassium 4.0 3.5 - 5.1 mmol/L   Chloride 99 98 - 111 mmol/L   CO2 25 22 - 32 mmol/L   Glucose, Bld 211 (H) 70 - 99 mg/dL    Comment: Glucose reference range applies only to samples taken after fasting for at least 8 hours.   BUN 9 8 - 23 mg/dL   Creatinine, Ser 8.92 (H) 0.44 - 1.00 mg/dL   Calcium 8.8 (L) 8.9 - 10.3 mg/dL   GFR,  Estimated 52 (L) >60 mL/min    Comment: (NOTE) Calculated using the CKD-EPI Creatinine Equation (2021)    Anion gap 12 5 - 15    Comment: Performed at [facility], [address] [place], [place] [address]  APTT     Status: Abnormal   Collection Time: 10/18/23  1:57 AM  Result Value Ref Range   aPTT 103 (H) 24 - 36 seconds    Comment:        IF BASELINE aPTT IS ELEVATED, SUGGEST PATIENT RISK ASSESSMENT BE USED TO DETERMINE APPROPRIATE ANTICOAGULANT THERAPY. Performed at [facility], [address] [place], [place] [address]   Glucose, capillary     Status: Abnormal   Collection Time: 10/18/23  4:55 AM  Result Value Ref Range   Glucose-Capillary 286 (H) 70 - 99 mg/dL    Comment: Glucose reference range applies only to samples taken after fasting for at least 8 hours.  Glucose, capillary     Status: Abnormal   Collection Time: 10/18/23  7:46 AM  Result Value Ref Range   Glucose-Capillary 254 (H) 70 - 99 mg/dL  Comment: Glucose reference range applies only to samples taken after fasting for at least 8 hours.  Heparin  level (unfractionated)     Status: Abnormal   Collection Time: 10/18/23 12:01 PM  Result Value Ref Range   Heparin  Unfractionated 0.83 (H) 0.30 - 0.70 IU/mL    Comment: (NOTE) The clinical reportable range upper limit is being lowered to >1.10 to align with the FDA approved guidance for the current laboratory assay.  If heparin  results are below expected values, and patient dosage has  been confirmed, suggest follow up testing of antithrombin III levels. Performed at [facility], [address] [place], [place] [address]   APTT     Status: Abnormal   Collection Time: 10/18/23 12:01 PM  Result Value Ref Range   aPTT 94 (H) 24 - 36 seconds    Comment:        IF BASELINE aPTT IS ELEVATED, SUGGEST PATIENT RISK ASSESSMENT BE USED TO DETERMINE APPROPRIATE ANTICOAGULANT THERAPY. Performed at [facility], [address] [place], [place] [address]   Glucose, capillary     Status: Abnormal   Collection Time: 10/18/23 12:10 PM  Result Value Ref Range   Glucose-Capillary 174 (H) 70 - 99 mg/dL    Comment: Glucose reference range applies only to samples taken after fasting for at least 8 hours.  Glucose, capillary     Status: Abnormal   Collection Time: 10/18/23  4:37 PM  Result Value Ref Range   Glucose-Capillary 231 (H) 70 - 99 mg/dL    Comment: Glucose reference range applies only to samples taken after fasting for at least 8 hours.  APTT     Status: Abnormal   Collection Time: 10/18/23  8:14 PM  Result Value Ref Range   aPTT 175 (HH) 24 - 36 seconds    Comment:        IF BASELINE aPTT IS ELEVATED, SUGGEST PATIENT RISK ASSESSMENT BE USED TO DETERMINE APPROPRIATE ANTICOAGULANT THERAPY. CRITICAL RESULT CALLED TO, READ BACK BY AND VERIFIED WITH: LAKISHA SUTTON ONIYA @2113  ON 10/18/23 SJKL Performed at [facility], [address] [place], [place] [address]   Glucose, capillary     Status: Abnormal   Collection Time: 10/18/23  8:18 PM  Result Value Ref Range   Glucose-Capillary 217 (H) 70 - 99 mg/dL    Comment: Glucose reference range applies only to samples taken after fasting for at least 8 hours.  Glucose, capillary     Status: Abnormal   Collection Time: 10/19/23 12:00 AM  Result Value Ref Range   Glucose-Capillary 206 (H) 70 - 99 mg/dL    Comment: Glucose reference range applies only to samples taken after fasting for at least 8 hours.  Glucose, capillary     Status: Abnormal   Collection Time: 10/19/23  3:48 AM  Result Value Ref Range   Glucose-Capillary 160 (H) 70 - 99 mg/dL    Comment: Glucose reference range applies only to samples taken after fasting for at least 8 hours.  Heparin  level (unfractionated)     Status: None   Collection Time: 10/19/23  5:53 AM  Result Value Ref Range   Heparin  Unfractionated 0.56 0.30 - 0.70 IU/mL    Comment: (NOTE) The clinical reportable  range upper limit is being lowered to >1.10 to align with the FDA approved guidance for the current laboratory assay.  If heparin  results are below expected values, and patient dosage has  been confirmed, suggest follow up testing of antithrombin III  levels. Performed at [facility], [address] [place], [place] [address]   Glucose, capillary     Status: Abnormal   Collection Time: 10/19/23  9:00 AM  Result Value Ref Range   Glucose-Capillary 128 (H) 70 - 99 mg/dL    Comment: Glucose reference range applies only to samples taken after fasting for at least 8 hours.   Comment 1 Notify RN    Comment 2 Document in Chart   APTT     Status: Abnormal   Collection Time: 10/19/23  9:16 AM  Result Value Ref Range   aPTT 55 (H) 24 - 36 seconds    Comment:        IF BASELINE aPTT IS ELEVATED, SUGGEST PATIENT RISK ASSESSMENT BE USED TO DETERMINE APPROPRIATE ANTICOAGULANT THERAPY. Performed at [facility], [address] [place], [place] [address]   CBC     Status: Abnormal   Collection Time: 10/19/23  9:16 AM  Result Value Ref Range   WBC 5.0 4.0 - 10.5 K/uL   RBC 4.23 3.87 - 5.11 MIL/uL   Hemoglobin 10.1 (L) 12.0 - 15.0 g/dL   HCT 67.7 (L) 63.9 - 53.9 %   MCV 76.1 (L) 80.0 - 100.0 fL   MCH 23.9 (L) 26.0 - 34.0 pg   MCHC 31.4 30.0 - 36.0 g/dL   RDW 78.9 (H) 88.4 - 84.4 %   Platelets 395 150 - 400 K/uL   nRBC 0.0 0.0 - 0.2 %    Comment: Performed at [facility], [address], [place], [place] [address]  Basic metabolic panel     Status: Abnormal   Collection Time: 10/19/23  9:16 AM  Result Value Ref Range   Sodium 137 135 - 145 mmol/L   Potassium 4.4 3.5 - 5.1 mmol/L   Chloride 99 98 - 111 mmol/L   CO2 29 22 - 32 mmol/L   Glucose, Bld 141 (H) 70 - 99 mg/dL    Comment: Glucose reference range applies only to samples taken after fasting for at least 8 hours.   BUN 12 8 - 23 mg/dL   Creatinine, Ser 9.14 0.44 - 1.00 mg/dL   Calcium 9.0 8.9 -  89.6 mg/dL   GFR, Estimated >39 >39 mL/min    Comment: (NOTE) Calculated using the CKD-EPI Creatinine Equation (2021)    Anion gap 9 5 - 15    Comment: Performed at [facility], [address] [place], [place] [address]  Glucose, capillary     Status: Abnormal   Collection Time: 10/19/23 11:39 AM  Result Value Ref Range   Glucose-Capillary 117 (H) 70 - 99 mg/dL    Comment: Glucose reference range applies only to samples taken after fasting for at least 8 hours.   Comment 1 Notify RN    Comment 2 Document in Chart   Glucose, capillary     Status: Abnormal   Collection Time: 10/19/23 12:17 PM  Result Value Ref Range   Glucose-Capillary 116 (H) 70 - 99 mg/dL    Comment: Glucose reference range applies only to samples taken after fasting for at least 8 hours.  Glucose, capillary     Status: Abnormal   Collection Time: 10/19/23  3:03 PM  Result Value Ref Range   Glucose-Capillary 116 (H) 70 - 99 mg/dL    Comment: Glucose reference range applies only to samples taken after fasting for at least 8 hours.  Glucose, capillary     Status: Abnormal   Collection  Time: 10/19/23  4:47 PM  Result Value Ref Range   Glucose-Capillary 117 (H) 70 - 99 mg/dL    Comment: Glucose reference range applies only to samples taken after fasting for at least 8 hours.   Comment 1 Notify RN    Comment 2 Document in Chart   Glucose, capillary     Status: Abnormal   Collection Time: 10/19/23  8:43 PM  Result Value Ref Range   Glucose-Capillary 275 (H) 70 - 99 mg/dL    Comment: Glucose reference range applies only to samples taken after fasting for at least 8 hours.  Glucose, capillary     Status: Abnormal   Collection Time: 10/20/23  2:47 AM  Result Value Ref Range   Glucose-Capillary 177 (H) 70 - 99 mg/dL    Comment: Glucose reference range applies only to samples taken after fasting for at least 8 hours.  CBC     Status: Abnormal   Collection Time: 10/20/23  5:15 AM  Result Value Ref Range    WBC 6.2 4.0 - 10.5 K/uL   RBC 4.00 3.87 - 5.11 MIL/uL   Hemoglobin 9.6 (L) 12.0 - 15.0 g/dL   HCT 69.5 (L) 63.9 - 53.9 %   MCV 76.0 (L) 80.0 - 100.0 fL   MCH 24.0 (L) 26.0 - 34.0 pg   MCHC 31.6 30.0 - 36.0 g/dL   RDW 78.4 (H) 88.4 - 84.4 %   Platelets 399 150 - 400 K/uL   nRBC 0.0 0.0 - 0.2 %    Comment: Performed at [facility], [address] [place], [place] [address]  Basic metabolic panel     Status: Abnormal   Collection Time: 10/20/23  5:15 AM  Result Value Ref Range   Sodium 136 135 - 145 mmol/L   Potassium 4.3 3.5 - 5.1 mmol/L   Chloride 100 98 - 111 mmol/L   CO2 25 22 - 32 mmol/L   Glucose, Bld 172 (H) 70 - 99 mg/dL    Comment: Glucose reference range applies only to samples taken after fasting for at least 8 hours.   BUN 14 8 - 23 mg/dL   Creatinine, Ser 9.04 0.44 - 1.00 mg/dL   Calcium 8.6 (L) 8.9 - 10.3 mg/dL   GFR, Estimated >39 >39 mL/min    Comment: (NOTE) Calculated using the CKD-EPI Creatinine Equation (2021)    Anion gap 11 5 - 15    Comment: Performed at [facility], [address] [place], [place] [address]  Glucose, capillary     Status: Abnormal   Collection Time: 10/20/23  7:34 AM  Result Value Ref Range   Glucose-Capillary 169 (H) 70 - 99 mg/dL    Comment: Glucose reference range applies only to samples taken after fasting for at least 8 hours.  Glucose, capillary     Status: Abnormal   Collection Time: 10/20/23 11:37 AM  Result Value Ref Range   Glucose-Capillary 322 (H) 70 - 99 mg/dL    Comment: Glucose reference range applies only to samples taken after fasting for at least 8 hours.  VAS US  ABI WITH/WO TBI     Status: None   Collection Time: 11/22/23  8:58 AM  Result Value Ref Range   Right ABI .99    Left ABI .75        Assessment & Plan Type 2 diabetes mellitus with hyperglycemia, with long-term current use of insulin  (HCC) Continue current diabetes POC, as patient has been well controlled on current  regimen.   Will adjust meds if needed based on labs.   Urinary incontinence without sensory awareness Patient symptoms are well managed with her incontinence supplies.   Will continue her RX as needed.   Vitamin D  deficiency, unspecified Will continue supplements as needed.   Mixed hyperlipidemia Checking labs today.  Continue current therapy for lipid control. Will modify as needed based on labwork results.   Essential hypertension, benign Blood pressure well controlled with current medications.  Continue current therapy.  Will reassess at follow up.   - CBC w/Diff - CMP w/eGFR  CKD stage 3a, GFR 45-59 ml/min (HCC) Patient is seen by nephrology, who manage this condition.  She is well controlled with current therapy.   Will defer to them for further changes to plan of care.     Return in about 3 months (around 02/27/2024).   Total time spent: 20 minutes  ALAN CHRISTELLA ARRANT, FNP  11/27/2023   This document may have been prepared by [facility] Voice Recognition software and as such may include unintentional dictation errors.

## 2023-11-27 NOTE — Progress Notes (Signed)
 11/27/2023 Name: Wanda Gardner MRN: [id number] DOB: [birth year]  Chief Complaint  Patient presents with   Medication Management   Diabetes    Wanda Gardner is a 83 y.o. year old female who presented in office with her sister and all of her medications.   They were referred to the pharmacist by their PCP for assistance in managing complex medication management.    Subjective:  Care Team: Primary Care Provider: Orlean Alan HERO, FNP   Medication Access/Adherence  Current Pharmacy:  [facility] - [place], [place] - [address] [place] [address] Phone: [phone] Fax: [phone]  [facility] [address] ([address]), [place] - [address] [place]) [place] [address] Phone: [phone] Fax: [phone]   Patient reports affordability concerns with their medications: No  Patient reports access/transportation concerns to their pharmacy: Yes - needs meds delivered Patient reports adherence concerns with their medications: No  Diabetes:  Current medications: Lantus  10 units once daily, Humalog  2 units TID with meals (really only using once or twice a day as she normally does not eat TID) Medications tried in the past: Metformin   Current glucose readings:  Brings in Dexcom Receiver device 14 day GMI showing 6.7, avg BG 147 1 low of 69  No sensor on currently, it failed pairing  Observed patterns:  Patient denies hypoglycemic s/sx including dizziness, shakiness, sweating. Patient denies hyperglycemic symptoms including polyuria, polydipsia, polyphagia, nocturia, neuropathy, blurred vision.   Current medication access support: None  Macrovascular and Microvascular Risk Reduction:  Statin? yes (pravastatin ); ACEi/ARB? yes (losartan ) Last urinary albumin/creatinine ratio:  Lab Results  Component Value Date   MICRALBCREAT <30 12/14/2022   Last eye exam:   Last foot exam:  No foot exam found Tobacco Use:  Tobacco Use: High Risk (11/22/2023)   Patient History    Smoking Tobacco Use: Every Day    Smokeless Tobacco Use: Never    Passive Exposure: Not on file   Hypertension:  Current medications: Losartan  50mg  1 tab daily, Lasix  20mg  daily Medications previously tried: Chorthalidone, hydrochlorothiazide   Hyperlipidemia/ASCVD Risk Reduction  Current lipid lowering medications: Pravastatin  40mg  Medications tried in the past: None  Antiplatelet regimen: Aspirin  81mg  daily   Atrial Fibrillation:  Current medications: Eliquis  5mg  BID Past: Metoprolol   Patient denies hypotensive s/sx including dizziness, lightheadedness. Patient denies hypertensive symptoms including headache, chest pain, shortness of breath   Current medication access support: None    Objective:  Lab Results  Component Value Date   HGBA1C 6.5 (H) 10/15/2023    Lab Results  Component Value Date   CREATININE 0.95 10/20/2023   BUN 14 10/20/2023   NA 136 10/20/2023   K 4.3 10/20/2023   CL 100 10/20/2023   CO2 25 10/20/2023    Lab Results  Component Value Date   CHOL 140 12/14/2022   HDL 47 12/14/2022   LDLCALC 79 12/14/2022   TRIG 72 12/14/2022   CHOLHDL 3.0 12/14/2022    Medications Reviewed Today     Reviewed by Lionell Jon DEL, RPH (Pharmacist) on 11/27/23 at 1223  Med List Status: <None>   Medication Order Taking? Sig Documenting Provider Last Dose Status Informant  Accu-Chek Softclix Lancets lancets [id number]  Use with device to check sugars twice daily Orlean Alan HERO, FNP  Active Family Member, Pharmacy Records  albuterol  (VENTOLIN  HFA) 108 [phone] Base) MCG/ACT inhaler [id number]  Inhale 2 puffs into the lungs every 6 (six) hours as needed  for wheezing or shortness of breath. Orlean Alan HERO, FNP  Active Family Member, Pharmacy Records  apixaban  (ELIQUIS ) 5 MG TABS tablet [id number]  Take 1 tablet (5 mg total) by mouth 2 (two) times daily. Josette Ade,  MD  Active   aspirin  EC 81 MG tablet [id number]  Take 1 tablet (81 mg total) by mouth daily. Swallow whole. Josette Ade, MD  Active   betamethasone , augmented, (DIPROLENE ) 0.05 % lotion [id number]  Apply 1 Application topically 2 (two) times daily. [provider]  Active Family Member, Pharmacy Records  Cholecalciferol  (VITAMIN D3) 125 MCG (5000 UT) CAPS [id number]  Take 1 capsule (5,000 Units total) by mouth daily. Orlean Alan HERO, FNP  Active Family Member, Pharmacy Records  diclofenac  sodium (VOLTAREN ) 1 % GEL [id number]  Apply 2 g topically 2 (two) times daily as needed (pain).  [provider]  Active Pharmacy Records, Family Member  docusate sodium  (COLACE) 100 MG capsule [id number]  Take 1 capsule (100 mg total) by mouth 2 (two) times daily. Viviann Pastor, MD  Active Pharmacy Records, Family Member  dorzolamide -timolol  (COSOPT ) 2-0.5 % ophthalmic solution [id number]  Place 1 drop into both eyes 2 (two) times daily. [provider]  Active Family Member, Pharmacy Records  doxepin  (SINEQUAN ) 10 MG capsule [id number]  TAKE 1 CAPSULE BY MOUTH AT BEDTIME Orlean Alan HERO, FNP  Active Family Member, Pharmacy Records  DULoxetine  (CYMBALTA ) 60 MG capsule [id number]  TAKE 1 CAPSULE BY MOUTH DAILY Orlean Alan HERO, FNP  Active Family Member, Pharmacy Records  esomeprazole  (NEXIUM ) 40 MG capsule [id number]  Take 1 capsule (40 mg total) by mouth daily. Orlean Alan HERO, FNP  Active Family Member, Pharmacy Records  ferrous sulfate  325 [phone] FE) MG tablet [id number]  Take 325 mg by mouth daily with breakfast. [provider]  Active Pharmacy Records, Family Member           Med Note CATHY OVAL DEL   Dju Jun 10, 2022  3:10 PM)    fluticasone  (FLONASE ) 50 MCG/ACT nasal spray [id number]  Place 2 sprays into both nostrils daily as needed for allergies. Orlean Alan HERO, FNP  Active Family Member, Pharmacy Records  furosemide  (LASIX ) 20 MG tablet [id number]  TAKE 1  TABLET BY MOUTH DAILY Orlean Alan HERO, FNP  Active Family Member, Pharmacy Records  glucose blood (ACCU-CHEK GUIDE TEST) test strip [id number]  CHECK BLOOD SUGAR TWICE DAILY Orlean Alan HERO, FNP  Active Family Member, Pharmacy Records  insulin  glargine (LANTUS  SOLOSTAR) 100 UNIT/ML Solostar Pen [id number]  Inject 10 Units into the skin at bedtime. Orlean Alan HERO, FNP  Active   insulin  lispro (HUMALOG ) 100 UNIT/ML KwikPen [id number]  Inject 2 Units into the skin 3 (three) times daily with meals. If eating and Blood Glucose (BG) 80 or higher inject 2 units for meal coverage and add correction dose per scale. If not eating, correction dose only. BG <150= 0 unit; BG 150-200= 1 unit; BG 201-250= 2 unit; BG 251-300= 3 unit; BG 301-350= 4 unit; BG 351-400= 5 unit; BG >400= 6 unit and Call Primary Care. Josette Ade, MD  Active   Insulin  Pen Needle (BD PEN NEEDLE NANO U/F) 32G X 4 MM MISC [id number]  1 each by Does not apply route in the morning, at noon, in the evening, and at bedtime. Orlean Alan HERO, FNP  Active Family Member, Pharmacy Records  lactulose  (CHRONULAC ) 10 GM/15ML solution [id number]  Take 15 mLs (10 g total) by mouth 2 (two) times daily as needed  for mild constipation, moderate constipation or severe constipation.  Patient not taking: Reported on 11/06/2023   Orlean Alan HERO, FNP  Active Family Member, Pharmacy Records  losartan  (COZAAR ) 50 MG tablet [id number]  TAKE 1 TABLET BY MOUTH DAILY Orlean Alan HERO, FNP  Active Family Member, Pharmacy Records  polyethylene glycol (MIRALAX  / GLYCOLAX ) 17 g packet [id number]  Take 17 g by mouth daily. Josette Ade, MD  Active   potassium chloride  SA (K-DUR,KLOR-CON ) 20 MEQ tablet [id number]  Take 20 mEq by mouth daily. [provider]  Active Pharmacy Records, Family Member  pravastatin  (PRAVACHOL ) 40 MG tablet [id number]  TAKE 1 TABLET BY MOUTH DAILY Orlean Alan HERO, FNP  Active Family Member, Pharmacy Records  pregabalin  (LYRICA ) 75  MG capsule [id number]  Take 1 capsule (75 mg total) by mouth 2 (two) times daily. Brown, Fallon E, NP  Active   vitamin B-12 (CYANOCOBALAMIN ) 1000 MCG tablet [id number]  Take 1,000 mcg by mouth daily. [provider]  Active Pharmacy Records, Family Member           Med Note WORLEY, LUCIANO RAMAN   Fri Jun 16, 2016  4:43 PM)                Assessment/Plan:   Diabetes: - Currently controlled; goal A1c <7%. Cardiorenal risk reduction is optimized.. Blood pressure is at goal <130/80. LDL is not at goal.  - Reviewed goal A1c, goal fasting, and goal 2 hour post prandial glucose. Recommended to check glucose three times daily - Recommend to continue continue current med therapy. -Placed dexcom g7 sensor sample in office and sent in rx to pharmacy  Hypertension: - Currently controlled - Reviewed long term cardiovascular and renal outcomes of uncontrolled blood pressure - Reviewed appropriate blood pressure monitoring technique and reviewed goal blood pressure. Recommended to check home blood pressure and heart rate once weekly - Recommend to continue current medication therapy.  -Attempted to contact [facility] with pt in office. No answer, got voicemail. Unable to leave a voicemail at this time because patient's phone is turned off and unable to leave a callback number. Patient expects her phone to be back on Thurs/Fri, provided her with number to call and schedule angio. -Reviewed [facility] transport services, patient declines to use and declines to go to social services for sdoh needs.     Hyperlipidemia/ASCVD Risk Reduction: - Currently uncontrolled.  - Reviewed long term complications of uncontrolled cholesterol - Reviewed dietary recommendations including low fat diet - Recommend to get updated lipid panel at next PCP visit, if LDL still above 70, can consider statin change or edition of zetia     Atrial Fibrillation: - Currently controlled - Reviewed importance of  adherence to anticoagulant for stroke prevention. - Reviewed appropriate blood pressure monitoring technique and reviewed goal blood pressure. Recommended to check home blood pressure and heart rate daily - Recommend to continue current med therapy   Follow Up Plan:will reach out to schedule next week  Jon VEAR Lindau, PharmD Clinical Pharmacist [phone]

## 2023-11-29 ENCOUNTER — Telehealth: Payer: Self-pay

## 2023-11-29 NOTE — Telephone Encounter (Signed)
 Spoke with the patient's sister and the patient's phone is turned off which is the same as the daughter's number as well. The sister stated she will give the patient my number to have her contact me.

## 2023-12-02 ENCOUNTER — Encounter: Payer: Self-pay | Admitting: Family

## 2023-12-02 NOTE — Assessment & Plan Note (Signed)
 Blood pressure well controlled with current medications.  Continue current therapy.  Will reassess at follow up.   - CBC w/Diff - CMP w/eGFR

## 2023-12-02 NOTE — Assessment & Plan Note (Signed)
Patient is seen by nephrology, who manage this condition.  She is well controlled with current therapy.   Will defer to them for further changes to plan of care.

## 2023-12-02 NOTE — Assessment & Plan Note (Signed)
 Patient symptoms are well managed with her incontinence supplies.   Will continue her RX as needed.

## 2023-12-02 NOTE — Assessment & Plan Note (Signed)
 Continue current diabetes POC, as patient has been well controlled on current regimen.  Will adjust meds if needed based on labs.

## 2023-12-02 NOTE — Assessment & Plan Note (Signed)
 Checking labs today.  Continue current therapy for lipid control. Will modify as needed based on labwork results.

## 2023-12-02 NOTE — Assessment & Plan Note (Signed)
 Will continue supplements as needed.

## 2023-12-03 ENCOUNTER — Telehealth (INDEPENDENT_AMBULATORY_CARE_PROVIDER_SITE_OTHER): Payer: Self-pay

## 2023-12-03 NOTE — Telephone Encounter (Signed)
 I have attempted to contact everyone on the patient's emergency list to no avail. I was unable to leave a message either.

## 2023-12-04 ENCOUNTER — Telehealth: Payer: Self-pay

## 2023-12-04 NOTE — Progress Notes (Signed)
   12/04/2023  Patient ID: Wanda Gardner, female   DOB: 04-May-1940, 83 y.o.   MRN: [id number]  Attempted to contact patient to follow up on med management. Phone number inactive. No mychart to send message.  Jon VEAR Lindau, PharmD Clinical Pharmacist [phone]

## 2023-12-11 ENCOUNTER — Telehealth (INDEPENDENT_AMBULATORY_CARE_PROVIDER_SITE_OTHER): Payer: Self-pay

## 2023-12-11 NOTE — Telephone Encounter (Signed)
 Spoke with the patient's sister and she is scheduled with Dr. Jama for a LLE angio on 12/25/23 with a 12:00 pm arrival time to the [facility]. Pre-procedure instructions were discussed and will be mailed.

## 2023-12-20 ENCOUNTER — Other Ambulatory Visit: Payer: Self-pay | Admitting: Family

## 2023-12-24 ENCOUNTER — Telehealth: Payer: Self-pay | Admitting: Family

## 2023-12-24 NOTE — Telephone Encounter (Signed)
 Dawn from [facility] called to get ICD-10 code for tramadol . Insurance is not covering it and they want to try submitting an ICD-10 code before resorting to a PA.   I don't see tramadol  on her current med list. Is she supposed to be taking this? If so, what for?

## 2023-12-25 ENCOUNTER — Observation Stay
Admission: RE | Admit: 2023-12-25 | Discharge: 2023-12-26 | Disposition: A | Discharge status: Home / Self Care | Attending: Vascular Surgery | Admitting: Vascular Surgery

## 2023-12-25 ENCOUNTER — Encounter: Payer: Self-pay | Admitting: Vascular Surgery

## 2023-12-25 ENCOUNTER — Encounter
Admission: RE | Disposition: A | Discharge status: Home / Self Care | Payer: Self-pay | Source: Home / Self Care | Attending: Vascular Surgery

## 2023-12-25 ENCOUNTER — Other Ambulatory Visit: Payer: Self-pay

## 2023-12-25 DIAGNOSIS — I10 Essential (primary) hypertension: Secondary | ICD-10-CM | POA: Diagnosis not present

## 2023-12-25 DIAGNOSIS — E1140 Type 2 diabetes mellitus with diabetic neuropathy, unspecified: Secondary | ICD-10-CM | POA: Diagnosis not present

## 2023-12-25 DIAGNOSIS — Z9889 Other specified postprocedural states: Secondary | ICD-10-CM | POA: Diagnosis not present

## 2023-12-25 DIAGNOSIS — Z794 Long term (current) use of insulin: Secondary | ICD-10-CM | POA: Insufficient documentation

## 2023-12-25 DIAGNOSIS — I70222 Atherosclerosis of native arteries of extremities with rest pain, left leg: Secondary | ICD-10-CM

## 2023-12-25 DIAGNOSIS — Z79899 Other long term (current) drug therapy: Secondary | ICD-10-CM | POA: Insufficient documentation

## 2023-12-25 DIAGNOSIS — E1151 Type 2 diabetes mellitus with diabetic peripheral angiopathy without gangrene: Principal | ICD-10-CM | POA: Insufficient documentation

## 2023-12-25 DIAGNOSIS — E1165 Type 2 diabetes mellitus with hyperglycemia: Secondary | ICD-10-CM | POA: Insufficient documentation

## 2023-12-25 DIAGNOSIS — I70269 Atherosclerosis of native arteries of extremities with gangrene, unspecified extremity: Principal | ICD-10-CM | POA: Diagnosis present

## 2023-12-25 DIAGNOSIS — F17210 Nicotine dependence, cigarettes, uncomplicated: Secondary | ICD-10-CM | POA: Insufficient documentation

## 2023-12-25 DIAGNOSIS — I70229 Atherosclerosis of native arteries of extremities with rest pain, unspecified extremity: Secondary | ICD-10-CM

## 2023-12-25 DIAGNOSIS — Z95828 Presence of other vascular implants and grafts: Secondary | ICD-10-CM

## 2023-12-25 DIAGNOSIS — I700 Atherosclerosis of aorta: Secondary | ICD-10-CM | POA: Diagnosis not present

## 2023-12-25 HISTORY — PX: LOWER EXTREMITY ANGIOGRAPHY: CATH118251

## 2023-12-25 LAB — GLUCOSE, CAPILLARY
Glucose-Capillary: 109 mg/dL — ABNORMAL HIGH (ref 70–99)
Glucose-Capillary: 296 mg/dL — ABNORMAL HIGH (ref 70–99)

## 2023-12-25 LAB — CREATININE, SERUM
Creatinine, Ser: 1.04 mg/dL — ABNORMAL HIGH (ref 0.44–1.00)
GFR, Estimated: 54 mL/min — ABNORMAL LOW (ref >60)

## 2023-12-25 LAB — BUN: BUN: 17 mg/dL (ref 8–23)

## 2023-12-25 SURGERY — LOWER EXTREMITY ANGIOGRAPHY
Anesthesia: Moderate Sedation | Site: Leg Lower | Laterality: Left

## 2023-12-25 MED ORDER — DOXEPIN HCL 10 MG PO CAPS
10.0000 mg | ORAL_CAPSULE | Freq: Every day | ORAL | Status: DC
  Administered 2023-12-25: 10 mg via ORAL
  Filled 2023-12-25: qty 1

## 2023-12-25 MED ORDER — DULOXETINE HCL 60 MG PO CPEP
60.0000 mg | ORAL_CAPSULE | Freq: Every day | ORAL | Status: DC
  Administered 2023-12-26: 60 mg via ORAL
  Filled 2023-12-25: qty 1
  Filled 2023-12-25: qty 2

## 2023-12-25 MED ORDER — MIDAZOLAM HCL (PF) 2 MG/2ML IJ SOLN
INTRAMUSCULAR | Status: DC | PRN
  Administered 2023-12-25: .5 mg via INTRAVENOUS
  Administered 2023-12-25: 2 mg via INTRAVENOUS

## 2023-12-25 MED ORDER — DOCUSATE SODIUM 100 MG PO CAPS
100.0000 mg | ORAL_CAPSULE | Freq: Two times a day (BID) | ORAL | Status: DC
  Administered 2023-12-25 – 2023-12-26 (×2): 100 mg via ORAL
  Filled 2023-12-25 (×2): qty 1

## 2023-12-25 MED ORDER — CEFAZOLIN SODIUM-DEXTROSE 2-4 GM/100ML-% IV SOLN
INTRAVENOUS | Status: AC
  Filled 2023-12-25: qty 100

## 2023-12-25 MED ORDER — FENTANYL CITRATE (PF) 100 MCG/2ML IJ SOLN
INTRAMUSCULAR | Status: DC | PRN
  Administered 2023-12-25: 50 ug via INTRAVENOUS
  Administered 2023-12-25: 12.5 ug via INTRAVENOUS

## 2023-12-25 MED ORDER — TRIAMCINOLONE ACETONIDE 0.5 % EX CREA
TOPICAL_CREAM | Freq: Two times a day (BID) | CUTANEOUS | Status: DC
  Filled 2023-12-25: qty 15

## 2023-12-25 MED ORDER — OXYCODONE HCL 5 MG PO TABS
5.0000 mg | ORAL_TABLET | ORAL | Status: DC | PRN
  Administered 2023-12-25: 5 mg via ORAL
  Filled 2023-12-25: qty 1

## 2023-12-25 MED ORDER — ENSURE PLUS HIGH PROTEIN PO LIQD
237.0000 mL | Freq: Two times a day (BID) | ORAL | Status: DC
  Administered 2023-12-26: 237 mL via ORAL

## 2023-12-25 MED ORDER — ONDANSETRON HCL 4 MG/2ML IJ SOLN: 4.0000 mg | Freq: Four times a day (QID) | INTRAMUSCULAR | Status: DC | PRN

## 2023-12-25 MED ORDER — VITAMIN D 25 MCG (1000 UNIT) PO TABS
5000.0000 [IU] | ORAL_TABLET | Freq: Every day | ORAL | Status: DC
  Administered 2023-12-25 – 2023-12-26 (×2): 5000 [IU] via ORAL
  Filled 2023-12-25 (×2): qty 5

## 2023-12-25 MED ORDER — MIDAZOLAM HCL 2 MG/ML PO SYRP: 8.0000 mg | ORAL_SOLUTION | Freq: Once | ORAL | Status: DC | PRN

## 2023-12-25 MED ORDER — FENTANYL CITRATE (PF) 100 MCG/2ML IJ SOLN
INTRAMUSCULAR | Status: AC
  Filled 2023-12-25: qty 2

## 2023-12-25 MED ORDER — PREGABALIN 75 MG PO CAPS
75.0000 mg | ORAL_CAPSULE | Freq: Two times a day (BID) | ORAL | Status: DC
  Administered 2023-12-25 – 2023-12-26 (×2): 75 mg via ORAL
  Filled 2023-12-25 (×2): qty 1

## 2023-12-25 MED ORDER — SODIUM CHLORIDE 0.9 % IV SOLN
250.0000 mL | INTRAVENOUS | Status: DC | PRN
Start: 2023-12-25 — End: 2023-12-26

## 2023-12-25 MED ORDER — HEPARIN SODIUM (PORCINE) 1000 UNIT/ML IJ SOLN
INTRAMUSCULAR | Status: AC
Start: 2023-12-25 — End: 2023-12-25
  Filled 2023-12-25: qty 10

## 2023-12-25 MED ORDER — DORZOLAMIDE HCL-TIMOLOL MAL 2-0.5 % OP SOLN
1.0000 [drp] | Freq: Two times a day (BID) | OPHTHALMIC | Status: DC
  Administered 2023-12-25 – 2023-12-26 (×2): 1 [drp] via OPHTHALMIC
  Filled 2023-12-25: qty 10

## 2023-12-25 MED ORDER — MIDAZOLAM HCL 5 MG/5ML IJ SOLN
INTRAMUSCULAR | Status: AC
  Filled 2023-12-25: qty 5

## 2023-12-25 MED ORDER — LOSARTAN POTASSIUM 50 MG PO TABS
50.0000 mg | ORAL_TABLET | Freq: Every day | ORAL | Status: DC
  Administered 2023-12-25 – 2023-12-26 (×2): 50 mg via ORAL
  Filled 2023-12-25 (×2): qty 1

## 2023-12-25 MED ORDER — ASPIRIN 81 MG PO TBEC
81.0000 mg | DELAYED_RELEASE_TABLET | Freq: Every day | ORAL | Status: DC
  Administered 2023-12-25 – 2023-12-26 (×2): 81 mg via ORAL
  Filled 2023-12-25 (×2): qty 1

## 2023-12-25 MED ORDER — IODIXANOL 320 MG/ML IV SOLN
INTRAVENOUS | Status: DC | PRN
  Administered 2023-12-25: 65 mL via INTRA_ARTERIAL

## 2023-12-25 MED ORDER — CEFAZOLIN SODIUM-DEXTROSE 2-4 GM/100ML-% IV SOLN
2.0000 g | INTRAVENOUS | Status: AC
  Administered 2023-12-25: 2 g via INTRAVENOUS

## 2023-12-25 MED ORDER — PRAVASTATIN SODIUM 40 MG PO TABS
40.0000 mg | ORAL_TABLET | Freq: Every day | ORAL | Status: DC
  Administered 2023-12-25 – 2023-12-26 (×2): 40 mg via ORAL
  Filled 2023-12-25 (×2): qty 2
  Filled 2023-12-25 (×2): qty 1

## 2023-12-25 MED ORDER — HEPARIN SODIUM (PORCINE) 1000 UNIT/ML IJ SOLN
INTRAMUSCULAR | Status: DC | PRN
  Administered 2023-12-25: 6000 [IU] via INTRAVENOUS

## 2023-12-25 MED ORDER — INSULIN ASPART 100 UNIT/ML IJ SOLN
0.0000 [IU] | Freq: Every day | INTRAMUSCULAR | Status: DC
  Administered 2023-12-25: 3 [IU] via SUBCUTANEOUS
  Filled 2023-12-25: qty 1

## 2023-12-25 MED ORDER — DIPHENHYDRAMINE HCL 50 MG/ML IJ SOLN: 50.0000 mg | Freq: Once | INTRAMUSCULAR | Status: DC | PRN

## 2023-12-25 MED ORDER — SODIUM CHLORIDE 0.9% FLUSH: 3.0000 mL | INTRAVENOUS | Status: DC | PRN

## 2023-12-25 MED ORDER — FUROSEMIDE 20 MG PO TABS
20.0000 mg | ORAL_TABLET | Freq: Every day | ORAL | Status: DC
  Administered 2023-12-25 – 2023-12-26 (×2): 20 mg via ORAL
  Filled 2023-12-25 (×2): qty 1

## 2023-12-25 MED ORDER — FERROUS SULFATE 325 (65 FE) MG PO TABS
325.0000 mg | ORAL_TABLET | Freq: Every day | ORAL | Status: DC
  Administered 2023-12-26: 325 mg via ORAL
  Filled 2023-12-25: qty 1

## 2023-12-25 MED ORDER — PANTOPRAZOLE SODIUM 40 MG PO TBEC
40.0000 mg | DELAYED_RELEASE_TABLET | Freq: Every day | ORAL | Status: DC
  Administered 2023-12-25 – 2023-12-26 (×2): 40 mg via ORAL
  Filled 2023-12-25 (×2): qty 1

## 2023-12-25 MED ORDER — POLYETHYLENE GLYCOL 3350 17 G PO PACK
17.0000 g | PACK | Freq: Every day | ORAL | Status: DC
  Administered 2023-12-25 – 2023-12-26 (×2): 17 g via ORAL
  Filled 2023-12-25 (×2): qty 1

## 2023-12-25 MED ORDER — METHYLPREDNISOLONE SODIUM SUCC 125 MG IJ SOLR: 125.0000 mg | Freq: Once | INTRAMUSCULAR | Status: DC | PRN

## 2023-12-25 MED ORDER — SODIUM CHLORIDE 0.9 % IV SOLN: INTRAVENOUS | Status: DC

## 2023-12-25 MED ORDER — FLUTICASONE PROPIONATE 50 MCG/ACT NA SUSP
2.0000 | Freq: Every day | NASAL | Status: DC | PRN
Start: 2023-12-25 — End: 2023-12-26

## 2023-12-25 MED ORDER — ALBUTEROL SULFATE HFA 108 (90 BASE) MCG/ACT IN AERS: 2.0000 | INHALATION_SPRAY | Freq: Four times a day (QID) | RESPIRATORY_TRACT | Status: DC | PRN

## 2023-12-25 MED ORDER — ALBUTEROL SULFATE (2.5 MG/3ML) 0.083% IN NEBU: 2.5000 mg | INHALATION_SOLUTION | Freq: Four times a day (QID) | RESPIRATORY_TRACT | Status: DC | PRN

## 2023-12-25 MED ORDER — POTASSIUM CHLORIDE CRYS ER 20 MEQ PO TBCR
20.0000 meq | EXTENDED_RELEASE_TABLET | Freq: Every day | ORAL | Status: DC
  Administered 2023-12-25 – 2023-12-26 (×2): 20 meq via ORAL
  Filled 2023-12-25 (×2): qty 1

## 2023-12-25 MED ORDER — BETAMETHASONE VALERATE 0.1 % EX LOTN
1.0000 | TOPICAL_LOTION | Freq: Two times a day (BID) | CUTANEOUS | Status: DC
  Filled 2023-12-25: qty 60

## 2023-12-25 MED ORDER — LIDOCAINE HCL (PF) 1 % IJ SOLN
INTRAMUSCULAR | Status: DC | PRN
  Administered 2023-12-25: 10 mL via INTRADERMAL

## 2023-12-25 MED ORDER — APIXABAN 5 MG PO TABS
5.0000 mg | ORAL_TABLET | Freq: Two times a day (BID) | ORAL | Status: DC
  Administered 2023-12-25 – 2023-12-26 (×2): 5 mg via ORAL
  Filled 2023-12-25 (×2): qty 1

## 2023-12-25 MED ORDER — INFLUENZA VAC SPLIT HIGH-DOSE 0.5 ML IM SUSY
0.5000 mL | PREFILLED_SYRINGE | INTRAMUSCULAR | Status: DC
  Filled 2023-12-25: qty 0.5

## 2023-12-25 MED ORDER — SODIUM CHLORIDE 0.9% FLUSH
3.0000 mL | Freq: Two times a day (BID) | INTRAVENOUS | Status: DC
  Administered 2023-12-25 – 2023-12-26 (×2): 3 mL via INTRAVENOUS

## 2023-12-25 MED ORDER — HYDROMORPHONE HCL 1 MG/ML IJ SOLN: 1.0000 mg | Freq: Once | INTRAMUSCULAR | Status: DC | PRN

## 2023-12-25 MED ORDER — LACTULOSE 10 GM/15ML PO SOLN
10.0000 g | Freq: Two times a day (BID) | ORAL | Status: DC | PRN
Start: 2023-12-25 — End: 2023-12-26

## 2023-12-25 MED ORDER — INSULIN ASPART 100 UNIT/ML IJ SOLN: 0.0000 [IU] | Freq: Three times a day (TID) | INTRAMUSCULAR | Status: DC

## 2023-12-25 MED ORDER — HEPARIN (PORCINE) IN NACL 1000-0.9 UT/500ML-% IV SOLN
INTRAVENOUS | Status: DC | PRN
  Administered 2023-12-25: 1000 mL

## 2023-12-25 MED ORDER — FAMOTIDINE 20 MG PO TABS: 40.0000 mg | ORAL_TABLET | Freq: Once | ORAL | Status: DC | PRN

## 2023-12-25 MED ORDER — VITAMIN B-12 1000 MCG PO TABS
1000.0000 ug | ORAL_TABLET | Freq: Every day | ORAL | Status: DC
  Administered 2023-12-26: 1000 ug via ORAL
  Filled 2023-12-25: qty 1

## 2023-12-25 SURGICAL SUPPLY — 24 items
BALLOON LUTONIX 5X220X130 (BALLOONS) IMPLANT
BALLOON LUTONIX DCB 5X60X130 (BALLOONS) IMPLANT
CATH ANGIO 5F PIGTAIL 65CM (CATHETERS) IMPLANT
CATH BEACON 5 .035 65 RIM TIP (CATHETERS) IMPLANT
CATH BEACON 5 .038 100 VERT TP (CATHETERS) IMPLANT
CATH ROTAREX 135 6FR (CATHETERS) IMPLANT
COVER PROBE ULTRASOUND 5X96 (MISCELLANEOUS) IMPLANT
DEVICE PRESTO INFLATION (MISCELLANEOUS) IMPLANT
DEVICE STARCLOSE SE CLOSURE (Vascular Products) IMPLANT
GLIDEWIRE ADV .035X260CM (WIRE) IMPLANT
GOWN STRL REUS W/ TWL LRG LVL3 (GOWN DISPOSABLE) ×1 IMPLANT
LIFESTENT SOLO 6X200X135 (Permanent Stent) IMPLANT
NDL ENTRY 21GA 7CM ECHOTIP (NEEDLE) IMPLANT
NEEDLE ENTRY 21GA 7CM ECHOTIP (NEEDLE) ×1 IMPLANT
PACK ANGIOGRAPHY (CUSTOM PROCEDURE TRAY) ×1 IMPLANT
SET INTRO CAPELLA COAXIAL (SET/KITS/TRAYS/PACK) IMPLANT
SHEATH BRITE TIP 5FRX11 (SHEATH) IMPLANT
SHEATH RAABE 6FR (SHEATH) IMPLANT
STENT LIFESTENT 5F 6X60X135 (Permanent Stent) IMPLANT
SYR MEDRAD MARK 7 150ML (SYRINGE) IMPLANT
TUBING CONTRAST HIGH PRESS 72 (TUBING) IMPLANT
WIRE G V18X300CM (WIRE) IMPLANT
WIRE J 3MM .035X145CM (WIRE) IMPLANT
WIRE SUPRACORE 300CM (WIRE) IMPLANT

## 2023-12-25 NOTE — Interval H&P Note (Signed)
 History and Physical Interval Note:  12/25/2023 12:53 PM  Wanda Gardner  has presented today for surgery, with the diagnosis of LLE Angio   ASO w rest pain.  The various methods of treatment have been discussed with the patient and family. After consideration of risks, benefits and other options for treatment, the patient has consented to  Procedure(s): Lower Extremity Angiography (Left) as a surgical intervention.  The patient's history has been reviewed, patient examined, no change in status, stable for surgery.  I have reviewed the patient's chart and labs.  Questions were answered to the patient's satisfaction.     Cordella Shawl

## 2023-12-25 NOTE — Progress Notes (Signed)
 MRN : [id number]  Wanda Gardner is a 83 y.o. (September 12, 1940) female who presents with chief complaint of check circulation.  History of Present Illness:   Patient presents to [name] for further treatment of her left lower extremity atherosclerotic occlusive disease.  She is  status post angiogram with intervention on 08/292025.    Procedure: Procedure(s) Performed:             1.  Abdominal aortogram             2.  Bilateral distal runoff             3.  Percutaneous transluminal angioplasty and stent placement right common iliac artery; kissing balloon technique             4.  Percutaneous transluminal and plasty and stent placement left common iliac artery; kissing balloon technique             5.  Ultrasound guided access bilateral common femoral arteries             6.  StarClose closure device bilateral common femoral arteries     Was noted that following her angiogram she continues to have an occlusion of the SFA follow-up the popliteal artery which may require surgical intervention.   She has noticed swelling in the left leg as well as some recent significant pain.  The patient is a somewhat poor historian and is unclear whether she is suffering from this.  Or possible neuropathic pain since her foot was ischemic.   There have been no significant changes to the patient's overall health care.   No documented history of amaurosis fugax or recent TIA symptoms. There are no recent neurological changes noted. No documented history of DVT, PE or superficial thrombophlebitis. The patient denies recent episodes of angina or shortness of breath.    ABI's Rt=0.99 and Lt=0.75  (previous ABI's Rt=0.84 and Lt=0.24) Duplex US  of the right lower extremity shows biphasic waveforms with slightly dampened toe waveforms but monophasic in the left  No outpatient medications have been marked as  taking for the 12/25/23 encounter [facility] Encounter).    Past Medical History:  Diagnosis Date   Acid reflux    AKI (acute kidney injury) 06/10/2022   Arthritis    Asthma    Chest pain, unspecified 10/30/2013   Formatting of this note might be different from the original. Note: Unchanged Note: Unchanged Formatting of this note might be different from the original. Note: Unchanged Note: Unchanged   Constipation 04/20/2011   Formatting of this note might be different from the original. Note: Unchanged - probably [facility]. Note: Unchanged - probably [facility].   COPD (chronic obstructive pulmonary disease) (HCC)    Cough 10/30/2013   Formatting of this note might be different from the original. Note: Unchanged Note: Unchanged   Depression    Diabetes mellitus (HCC) 01/10/2022   Diabetes mellitus without complication (HCC)    Dyspnea    Glaucoma    Headache    right side of head   Headache 07/25/2011   Hyperlipidemia  Hypertension    Knee stiff 05/12/2022   Lens replaced 03/22/2010   Neuropathic pain of hands and feet    PONV (postoperative nausea and vomiting) 1982   back with my hysterectomy.   Pseudophakia 03/22/2010   Sleep apnea    Doesn't use CPAP   Stiffness of shoulder joint 05/12/2022   Trigger finger of right hand 01/10/2022   Wears dentures    full upper and lower    Past Surgical History:  Procedure Laterality Date   ABDOMINAL HYSTERECTOMY     CATARACT EXTRACTION W/ INTRAOCULAR LENS  IMPLANT, BILATERAL     CATARACT EXTRACTION W/PHACO Left 12/14/2021   Procedure: CATARACT EXTRACTION PHACO AND INTRAOCULAR LENS PLACEMENT (IOC) LEFT DIABETIC 24.79 02:23.9;  Surgeon: Mittie Gaskin, MD;  Location: [facility];  Service: Ophthalmology;  Laterality: Left;  Diabetic   CHOLECYSTECTOMY     COLONOSCOPY WITH PROPOFOL  N/A 10/29/2014   Procedure: COLONOSCOPY WITH PROPOFOL ;  Surgeon: Deward CINDERELLA Piedmont, MD;  Location: [facility];  Service: Gastroenterology;  Laterality: N/A;    ESOPHAGOGASTRODUODENOSCOPY (EGD) WITH PROPOFOL  N/A 10/29/2014   Procedure: ESOPHAGOGASTRODUODENOSCOPY (EGD) WITH PROPOFOL ;  Surgeon: Deward CINDERELLA Piedmont, MD;  Location: [facility] ENDOSCOPY;  Service: Gastroenterology;  Laterality: N/A;   EYE SURGERY Bilateral 2011   lid lift done 3 times   FOOT SURGERY     HAND SURGERY     LOWER EXTREMITY ANGIOGRAPHY Left 10/19/2023   Procedure: Lower Extremity Angiography;  Surgeon: Jama Cordella MATSU, MD;  Location: [facility] INVASIVE CV LAB;  Service: Cardiovascular;  Laterality: Left;   LOWER EXTREMITY INTERVENTION Left 10/19/2023   Procedure: LOWER EXTREMITY INTERVENTION;  Surgeon: Jama Cordella MATSU, MD;  Location: [facility] INVAS[facility] CV LAB;  Service: Cardiovascular;  Laterality: Left;   TONGUE SURGERY     removal of cancer   TONSILLECTOMY     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/13/2016   Procedure: TOTAL HIP ARTHROPLASTY ANTERIOR APPROACH;  Surgeon: Ozell Flake, MD;  Location: [facility] ORS;  Service: Orthopedics;  Laterality: Right;   TOTAL KNEE ARTHROPLASTY Right 11/23/2015   Procedure: TOTAL KNEE ARTHROPLASTY;  Surgeon: Ozell Flake, MD;  Location: [facility] ORS;  Service: Orthopedics;  Laterality: Right;   VOCAL CORD LATERALIZATION, ENDOSCOPIC APPROACH W/ MLB      Social History Social History   Tobacco Use   Smoking status: Every Day    Current packs/day: 0.50    Average packs/day: 0.5 packs/day for 50.0 years (25.0 ttl pk-yrs)    Types: Cigarettes   Smokeless tobacco: Never  Vaping Use   Vaping status: Never Used  Substance Use Topics   Alcohol use: Yes    Alcohol/week: 0.0 standard drinks of alcohol    Comment: 1 glass of wine twice a year   Drug use: Yes    Types: Marijuana    Comment: weed- daily use for pain relief when she can get  it    Family History Family History  Problem Relation Age of Onset   Cancer Father    Heart disease Mother     No Known Allergies   REVIEW OF SYSTEMS (Negative unless checked)  Constitutional: [] Weight loss  [] Fever   [] Chills Cardiac: [] Chest pain   [] Chest pressure   [] Palpitations   [] Shortness of breath when laying flat   [] Shortness of breath with exertion. Vascular:  [x] Pain in legs with walking   [] Pain in legs at rest  [] History of DVT   [] Phlebitis   [] Swelling in legs   [] Varicose veins   [] Non-healing ulcers Pulmonary:   []   Uses home oxygen   [] Productive cough   [] Hemoptysis   [] Wheeze  [] COPD   [] Asthma Neurologic:  [] Dizziness   [] Seizures   [] History of stroke   [] History of TIA  [] Aphasia   [] Vissual changes   [] Weakness or numbness in arm   [] Weakness or numbness in leg Musculoskeletal:   [] Joint swelling   [] Joint pain   [] Low back pain Hematologic:  [] Easy bruising  [] Easy bleeding   [] Hypercoagulable state   [] Anemic Gastrointestinal:  [] Diarrhea   [] Vomiting  [] Gastroesophageal reflux/heartburn   [] Difficulty swallowing. Genitourinary:  [] Chronic kidney disease   [] Difficult urination  [] Frequent urination   [] Blood in urine Skin:  [] Rashes   [] Ulcers  Psychological:  [] History of anxiety   []  History of major depression.  Physical Examination  There were no vitals filed for this visit. There is no height or weight on file to calculate BMI. Gen: WD/WN, NAD Head: [place]/AT, No temporalis wasting.  Ear/Nose/Throat: Hearing grossly intact, nares w/o erythema or drainage Eyes: PER, EOMI, sclera nonicteric.  Neck: Supple, no masses.  No bruit or JVD.  Pulmonary:  Good air movement, no audible wheezing, no use of accessory muscles.  Cardiac: RRR, normal S1, S2, no Murmurs. Vascular:  mild trophic changes, no open wounds Vessel Right Left  Radial Palpable Palpable  PT Not Palpable Not Palpable  DP Not Palpable Not Palpable  Gastrointestinal: soft, non-distended. No guarding/no peritoneal signs.  Musculoskeletal: M/S 5/5 throughout.  No visible deformity.  Neurologic: CN 2-12 intact. Pain and light touch intact in extremities.  Symmetrical.  Speech is fluent. Motor exam as listed  above. Psychiatric: Judgment intact, Mood & affect appropriate for pt's clinical situation. Dermatologic: No rashes or ulcers noted.  No changes consistent with cellulitis.   CBC Lab Results  Component Value Date   WBC 6.2 10/20/2023   HGB 9.6 (L) 10/20/2023   HCT 30.4 (L) 10/20/2023   MCV 76.0 (L) 10/20/2023   PLT 399 10/20/2023    BMET    Component Value Date/Time   NA 136 10/20/2023 0515   NA 141 12/14/2022 1032   NA 141 04/09/2011 0323   K 4.3 10/20/2023 0515   K 4.1 04/09/2011 0323   CL 100 10/20/2023 0515   CL 106 04/09/2011 0323   CO2 25 10/20/2023 0515   CO2 26 04/09/2011 0323   GLUCOSE 172 (H) 10/20/2023 0515   GLUCOSE 210 (H) 04/09/2011 0323   BUN 14 10/20/2023 0515   BUN 20 12/14/2022 1032   BUN 16 04/09/2011 0323   CREATININE 0.95 10/20/2023 0515   CREATININE 0.96 04/09/2011 0323   CALCIUM 8.6 (L) 10/20/2023 0515   CALCIUM 8.2 (L) 04/09/2011 0323   GFRNONAA >60 10/20/2023 0515   GFRNONAA >60 04/09/2011 0323   GFRAA >60 06/16/2016 1504   GFRAA >60 04/09/2011 0323   CrCl cannot be calculated (Patient's most recent lab result is older than the maximum 21 days allowed.).  COAG Lab Results  Component Value Date   INR 1.4 (H) 06/10/2022   INR 1.00 06/06/2016   INR 0.96 11/10/2015    Radiology No results found.   Assessment/Plan 1. 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  We have also restarted her Lyrica  as she notes that it was helpful previously for nerve pain for her.  The patient was profoundly ischemic prior to intervention so she may have some neuropathic pains that are actually causing some of her discomfort.  Recommend:   The patient has evidence of severe atherosclerotic changes of both lower extremities with rest pain that is associated with preulcerative changes and impending tissue loss of the left foot.  This represents a limb threatening ischemia  and places the patient at the risk for left limb loss.   Patient should undergo angiography of the left lower extremity with the hope for intervention for limb salvage.  The risks and benefits as well as the alternative therapies was discussed in detail with the patient.  All questions were answered.  Patient agrees to proceed with left lower extremity angiography.   The patient will follow up with me in the office after the procedure.     2. Type 2 diabetes mellitus with hyperglycemia, with long-term current use of insulin  (HCC) Continue hypoglycemic medications as already ordered, these medications have been reviewed and there are no changes at this time.   Hgb A1C to be monitored as already arranged by primary service   3. Essential hypertension, benign Continue antihypertensive medications as already ordered, these medications have been reviewed and there are no changes at this time.   Cordella Shawl, MD  12/25/2023 12:50 PM

## 2023-12-25 NOTE — Telephone Encounter (Signed)
 Looks like this was stopped at discharge on 10/20/23 and she is now on lyrica  as of 10/2 by another provider

## 2023-12-25 NOTE — Op Note (Signed)
 [facility]  Percutaneous Study/Intervention Procedural Note   Date of Surgery: 12/25/2023  Surgeon:  Cordella JUDITHANN Shawl, MD.  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  Introduction catheter into left lower extremity 3rd order catheter placement              2.    Contrast injection left lower extremity for distal runoff             3.  Percutaneous transluminal angioplasty and stent placement superficial femoral artery and above-knee popliteal artery.             4.  Thrombectomy of SFA occlusion with the Rota Rex catheter.                5.  Star close closure right common femoral arteriotomy  Anesthesia: Conscious sedation was administered under my direct supervision by the interventional radiology RN. IV Versed  plus fentanyl  were utilized. Continuous ECG, pulse oximetry and blood pressure was monitored throughout the entire procedure.  Conscious sedation was for a total of 53 minutes.  Sheath: 6 French Rabie right common femoral retrograde  Contrast: 65 cc  Fluoroscopy Time: 12.3 minutes  Indications:  Wanda Gardner presents with persistent rest pain symptoms of the left lower extremity.  The patient has known atherosclerotic occlusive disease. Noninvasive studies as well as physical examination demonstrate significant atherosclerotic changes. This places the patient at increased risk for limb loss. Angiography with the hope for intervention for limb salvage is recommended.  The risks and benefits are reviewed all questions answered patient agrees to proceed.  Procedure:  Wanda Gardner is a 83 y.o. y.o. female who was identified and appropriate procedural time out was performed.  The patient was then placed supine on the table and prepped and draped in the usual sterile fashion.    Ultrasound was placed in  the sterile sleeve and the right groin was evaluated the right common femoral artery was echolucent and pulsatile indicating patency.  Image was recorded for the permanent record and under real-time visualization a microneedle was inserted into the common femoral artery microwire followed by a micro-sheath.  A J-wire was then advanced through the micro-sheath and a  5 French sheath was then inserted over a J-wire. J-wire was then advanced and a 5 French pigtail catheter was positioned at the level of T12. AP projection of the aorta was then obtained. Pigtail catheter was repositioned to above the bifurcation and a RAO view of the pelvis was obtained.  Subsequently a rim catheter with the stiff angle Glidewire was used to cross the aortic bifurcation the catheter wire were advanced down into the left distal external iliac artery. Oblique view of the femoral bifurcation was then obtained and subsequently the wire was reintroduced and the pigtail catheter negotiated into the SFA representing third order catheter placement. Distal runoff was then performed.  6000 units of heparin  was then given and allowed to circulate and a 6 French Rabie sheath was advanced up and over the bifurcation and positioned in the left proximal superficial femoral artery  KMP  catheter and stiff angle Glidewire were then negotiated down into the distal popliteal through the distal SFA and proximal popliteal artery occlusion.  Distal runoff was then completed by hand injection through the catheter also verifying intraluminal positioning distal  to the occlusion.  A V18 wire was then reintroduced and a Rota Rex catheter was prepped on the field.  Catheter was then advanced down to the level of the occlusion and a total of 3 passes was made through the occlusion working in both an antegrade and retrograde direction.  Follow-up imaging now demonstrated recanalization with flow throughout the entire segment that had previously been occluded.   However, there were multiple areas of greater than 60% residual stenosis.  A 6 mm x 200 mm life stent was then deployed from the mid popliteal proximally.  This segment was then postdilated with a 5 mm x 220 mm Lutonix drug-eluting balloon inflated to 10 atm for approximately 1 minute.  Follow-up imaging demonstrated the stent was well-expanded and the 5 mm diameter appropriately sized.  A second 6 mm x 60 mm life stent was deployed overlapping the initial stent slightly and again extending up proximally.  A 5 mm x 60 mm Lutonix drug-eluting balloon was then used to fully expand this second stent inflation was to 10 atm for approximately 1 minute.  Follow-up imaging demonstrated patency with less than 10% residual stenosis. Distal runoff was then reassessed and found to be unchanged.  After review of these images the sheath is pulled into the right external iliac oblique of the common femoral is obtained and a Star close device deployed. There no immediate complications.   Findings:  The abdominal aorta is opacified with a bolus injection contrast. Renal arteries are single and widely patent. The aorta itself has diffuse disease but no hemodynamically significant lesions. The common and external iliac arteries are widely patent bilaterally.  Previously placed common iliac artery stents are widely patent and well-positioned.  The left common femoral is widely patent as is the profunda femoris.  The SFA is widely patent in its proximal one third.  Beginning in the mid SFA there is increasing disease and then in the distal one third as it crosses Hunter's canal into the above-knee popliteal there is an occlusion.  The popliteal artery is reconstituted at the level of the femoral condyles and is free of hemodynamically significant stenosis.  Trifurcation is diffusely diseased and there is an occlusion of the anterior tibial as well as the posterior tibial.  Tibioperoneal trunk and peroneal are patent down to the  foot with collaterals filling the pedal arch.    Following thrombectomy there is now recanalization of flow through the entire SFA and popliteal arteries.  Following angioplasty and stent placement the SFA and above-knee popliteal is now patent with in-line flow and looks quite nice and there is less than 10% residual stenosis.     Summary: Successful recanalization left lower extremity for limb salvage                        Disposition: Patient was taken to the recovery room in stable condition having tolerated the procedure well.  Dmonte Maher, Cordella MATSU 12/25/2023,3:00 PM

## 2023-12-25 NOTE — H&P (View-Only) (Signed)
 MRN : [id number]  Wanda Gardner is a 83 y.o. (September 12, 1940) female who presents with chief complaint of check circulation.  History of Present Illness:   Patient presents to [name] for further treatment of her left lower extremity atherosclerotic occlusive disease.  She is  status post angiogram with intervention on 08/292025.    Procedure: Procedure(s) Performed:             1.  Abdominal aortogram             2.  Bilateral distal runoff             3.  Percutaneous transluminal angioplasty and stent placement right common iliac artery; kissing balloon technique             4.  Percutaneous transluminal and plasty and stent placement left common iliac artery; kissing balloon technique             5.  Ultrasound guided access bilateral common femoral arteries             6.  StarClose closure device bilateral common femoral arteries     Was noted that following her angiogram she continues to have an occlusion of the SFA follow-up the popliteal artery which may require surgical intervention.   She has noticed swelling in the left leg as well as some recent significant pain.  The patient is a somewhat poor historian and is unclear whether she is suffering from this.  Or possible neuropathic pain since her foot was ischemic.   There have been no significant changes to the patient's overall health care.   No documented history of amaurosis fugax or recent TIA symptoms. There are no recent neurological changes noted. No documented history of DVT, PE or superficial thrombophlebitis. The patient denies recent episodes of angina or shortness of breath.    ABI's Rt=0.99 and Lt=0.75  (previous ABI's Rt=0.84 and Lt=0.24) Duplex US  of the right lower extremity shows biphasic waveforms with slightly dampened toe waveforms but monophasic in the left  No outpatient medications have been marked as  taking for the 12/25/23 encounter [facility] Encounter).    Past Medical History:  Diagnosis Date   Acid reflux    AKI (acute kidney injury) 06/10/2022   Arthritis    Asthma    Chest pain, unspecified 10/30/2013   Formatting of this note might be different from the original. Note: Unchanged Note: Unchanged Formatting of this note might be different from the original. Note: Unchanged Note: Unchanged   Constipation 04/20/2011   Formatting of this note might be different from the original. Note: Unchanged - probably [facility]. Note: Unchanged - probably [facility].   COPD (chronic obstructive pulmonary disease) (HCC)    Cough 10/30/2013   Formatting of this note might be different from the original. Note: Unchanged Note: Unchanged   Depression    Diabetes mellitus (HCC) 01/10/2022   Diabetes mellitus without complication (HCC)    Dyspnea    Glaucoma    Headache    right side of head   Headache 07/25/2011   Hyperlipidemia  Hypertension    Knee stiff 05/12/2022   Lens replaced 03/22/2010   Neuropathic pain of hands and feet    PONV (postoperative nausea and vomiting) 1982   back with my hysterectomy.   Pseudophakia 03/22/2010   Sleep apnea    Doesn't use CPAP   Stiffness of shoulder joint 05/12/2022   Trigger finger of right hand 01/10/2022   Wears dentures    full upper and lower    Past Surgical History:  Procedure Laterality Date   ABDOMINAL HYSTERECTOMY     CATARACT EXTRACTION W/ INTRAOCULAR LENS  IMPLANT, BILATERAL     CATARACT EXTRACTION W/PHACO Left 12/14/2021   Procedure: CATARACT EXTRACTION PHACO AND INTRAOCULAR LENS PLACEMENT (IOC) LEFT DIABETIC 24.79 02:23.9;  Surgeon: Mittie Gaskin, MD;  Location: [facility];  Service: Ophthalmology;  Laterality: Left;  Diabetic   CHOLECYSTECTOMY     COLONOSCOPY WITH PROPOFOL  N/A 10/29/2014   Procedure: COLONOSCOPY WITH PROPOFOL ;  Surgeon: Deward CINDERELLA Piedmont, MD;  Location: [facility];  Service: Gastroenterology;  Laterality: N/A;    ESOPHAGOGASTRODUODENOSCOPY (EGD) WITH PROPOFOL  N/A 10/29/2014   Procedure: ESOPHAGOGASTRODUODENOSCOPY (EGD) WITH PROPOFOL ;  Surgeon: Deward CINDERELLA Piedmont, MD;  Location: [facility] ENDOSCOPY;  Service: Gastroenterology;  Laterality: N/A;   EYE SURGERY Bilateral 2011   lid lift done 3 times   FOOT SURGERY     HAND SURGERY     LOWER EXTREMITY ANGIOGRAPHY Left 10/19/2023   Procedure: Lower Extremity Angiography;  Surgeon: Jama Cordella MATSU, MD;  Location: [facility];  Service: Cardiovascular;  Laterality: Left;   LOWER EXTREMITY INTERVENTION Left 10/19/2023   Procedure: LOWER EXTREMITY INTERVENTION;  Surgeon: Jama Cordella MATSU, MD;  Location: [facility];  Service: Cardiovascular;  Laterality: Left;   TONGUE SURGERY     removal of cancer   TONSILLECTOMY     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/13/2016   Procedure: TOTAL HIP ARTHROPLASTY ANTERIOR APPROACH;  Surgeon: Ozell Flake, MD;  Location: [facility] ORS;  Service: Orthopedics;  Laterality: Right;   TOTAL KNEE ARTHROPLASTY Right 11/23/2015   Procedure: TOTAL KNEE ARTHROPLASTY;  Surgeon: Ozell Flake, MD;  Location: [facility] ORS;  Service: Orthopedics;  Laterality: Right;   VOCAL CORD LATERALIZATION, ENDOSCOPIC APPROACH W/ MLB      Social History Social History   Tobacco Use   Smoking status: Every Day    Current packs/day: 0.50    Average packs/day: 0.5 packs/day for 50.0 years (25.0 ttl pk-yrs)    Types: Cigarettes   Smokeless tobacco: Never  Vaping Use   Vaping status: Never Used  Substance Use Topics   Alcohol use: Yes    Alcohol/week: 0.0 standard drinks of alcohol    Comment: 1 glass of wine twice a year   Drug use: Yes    Types: Marijuana    Comment: weed- daily use for pain relief when she can get  it    Family History Family History  Problem Relation Age of Onset   Cancer Father    Heart disease Mother     No Known Allergies   REVIEW OF SYSTEMS (Negative unless checked)  Constitutional: [] Weight loss  [] Fever   [] Chills Cardiac: [] Chest pain   [] Chest pressure   [] Palpitations   [] Shortness of breath when laying flat   [] Shortness of breath with exertion. Vascular:  [x] Pain in legs with walking   [] Pain in legs at rest  [] History of DVT   [] Phlebitis   [] Swelling in legs   [] Varicose veins   [] Non-healing ulcers Pulmonary:   []   Uses home oxygen   [] Productive cough   [] Hemoptysis   [] Wheeze  [] COPD   [] Asthma Neurologic:  [] Dizziness   [] Seizures   [] History of stroke   [] History of TIA  [] Aphasia   [] Vissual changes   [] Weakness or numbness in arm   [] Weakness or numbness in leg Musculoskeletal:   [] Joint swelling   [] Joint pain   [] Low back pain Hematologic:  [] Easy bruising  [] Easy bleeding   [] Hypercoagulable state   [] Anemic Gastrointestinal:  [] Diarrhea   [] Vomiting  [] Gastroesophageal reflux/heartburn   [] Difficulty swallowing. Genitourinary:  [] Chronic kidney disease   [] Difficult urination  [] Frequent urination   [] Blood in urine Skin:  [] Rashes   [] Ulcers  Psychological:  [] History of anxiety   []  History of major depression.  Physical Examination  There were no vitals filed for this visit. There is no height or weight on file to calculate BMI. Gen: WD/WN, NAD Head: [place]/AT, No temporalis wasting.  Ear/Nose/Throat: Hearing grossly intact, nares w/o erythema or drainage Eyes: PER, EOMI, sclera nonicteric.  Neck: Supple, no masses.  No bruit or JVD.  Pulmonary:  Good air movement, no audible wheezing, no use of accessory muscles.  Cardiac: RRR, normal S1, S2, no Murmurs. Vascular:  mild trophic changes, no open wounds Vessel Right Left  Radial Palpable Palpable  PT Not Palpable Not Palpable  DP Not Palpable Not Palpable  Gastrointestinal: soft, non-distended. No guarding/no peritoneal signs.  Musculoskeletal: M/S 5/5 throughout.  No visible deformity.  Neurologic: CN 2-12 intact. Pain and light touch intact in extremities.  Symmetrical.  Speech is fluent. Motor exam as listed  above. Psychiatric: Judgment intact, Mood & affect appropriate for pt's clinical situation. Dermatologic: No rashes or ulcers noted.  No changes consistent with cellulitis.   CBC Lab Results  Component Value Date   WBC 6.2 10/20/2023   HGB 9.6 (L) 10/20/2023   HCT 30.4 (L) 10/20/2023   MCV 76.0 (L) 10/20/2023   PLT 399 10/20/2023    BMET    Component Value Date/Time   NA 136 10/20/2023 0515   NA 141 12/14/2022 1032   NA 141 04/09/2011 0323   K 4.3 10/20/2023 0515   K 4.1 04/09/2011 0323   CL 100 10/20/2023 0515   CL 106 04/09/2011 0323   CO2 25 10/20/2023 0515   CO2 26 04/09/2011 0323   GLUCOSE 172 (H) 10/20/2023 0515   GLUCOSE 210 (H) 04/09/2011 0323   BUN 14 10/20/2023 0515   BUN 20 12/14/2022 1032   BUN 16 04/09/2011 0323   CREATININE 0.95 10/20/2023 0515   CREATININE 0.96 04/09/2011 0323   CALCIUM 8.6 (L) 10/20/2023 0515   CALCIUM 8.2 (L) 04/09/2011 0323   GFRNONAA >60 10/20/2023 0515   GFRNONAA >60 04/09/2011 0323   GFRAA >60 06/16/2016 1504   GFRAA >60 04/09/2011 0323   CrCl cannot be calculated (Patient's most recent lab result is older than the maximum 21 days allowed.).  COAG Lab Results  Component Value Date   INR 1.4 (H) 06/10/2022   INR 1.00 06/06/2016   INR 0.96 11/10/2015    Radiology No results found.   Assessment/Plan 1. 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  We have also restarted her Lyrica  as she notes that it was helpful previously for nerve pain for her.  The patient was profoundly ischemic prior to intervention so she may have some neuropathic pains that are actually causing some of her discomfort.  Recommend:   The patient has evidence of severe atherosclerotic changes of both lower extremities with rest pain that is associated with preulcerative changes and impending tissue loss of the left foot.  This represents a limb threatening ischemia  and places the patient at the risk for left limb loss.   Patient should undergo angiography of the left lower extremity with the hope for intervention for limb salvage.  The risks and benefits as well as the alternative therapies was discussed in detail with the patient.  All questions were answered.  Patient agrees to proceed with left lower extremity angiography.   The patient will follow up with me in the office after the procedure.     2. Type 2 diabetes mellitus with hyperglycemia, with long-term current use of insulin  (HCC) Continue hypoglycemic medications as already ordered, these medications have been reviewed and there are no changes at this time.   Hgb A1C to be monitored as already arranged by primary service   3. Essential hypertension, benign Continue antihypertensive medications as already ordered, these medications have been reviewed and there are no changes at this time.   Cordella Shawl, MD  12/25/2023 12:50 PM

## 2023-12-26 ENCOUNTER — Encounter: Payer: Self-pay | Admitting: Vascular Surgery

## 2023-12-26 DIAGNOSIS — Z9889 Other specified postprocedural states: Secondary | ICD-10-CM | POA: Diagnosis not present

## 2023-12-26 DIAGNOSIS — F17210 Nicotine dependence, cigarettes, uncomplicated: Secondary | ICD-10-CM | POA: Diagnosis not present

## 2023-12-26 DIAGNOSIS — E1140 Type 2 diabetes mellitus with diabetic neuropathy, unspecified: Secondary | ICD-10-CM | POA: Diagnosis not present

## 2023-12-26 DIAGNOSIS — E1151 Type 2 diabetes mellitus with diabetic peripheral angiopathy without gangrene: Secondary | ICD-10-CM | POA: Diagnosis not present

## 2023-12-26 DIAGNOSIS — I70201 Unspecified atherosclerosis of native arteries of extremities, right leg: Secondary | ICD-10-CM | POA: Diagnosis not present

## 2023-12-26 DIAGNOSIS — Z79899 Other long term (current) drug therapy: Secondary | ICD-10-CM | POA: Diagnosis not present

## 2023-12-26 DIAGNOSIS — I10 Essential (primary) hypertension: Secondary | ICD-10-CM | POA: Diagnosis not present

## 2023-12-26 DIAGNOSIS — I70222 Atherosclerosis of native arteries of extremities with rest pain, left leg: Secondary | ICD-10-CM | POA: Diagnosis not present

## 2023-12-26 DIAGNOSIS — E1165 Type 2 diabetes mellitus with hyperglycemia: Secondary | ICD-10-CM | POA: Diagnosis not present

## 2023-12-26 DIAGNOSIS — I70229 Atherosclerosis of native arteries of extremities with rest pain, unspecified extremity: Secondary | ICD-10-CM

## 2023-12-26 DIAGNOSIS — Z794 Long term (current) use of insulin: Secondary | ICD-10-CM | POA: Diagnosis not present

## 2023-12-26 LAB — GLUCOSE, CAPILLARY: Glucose-Capillary: 115 mg/dL — ABNORMAL HIGH (ref 70–99)

## 2023-12-26 NOTE — Care Management Obs Status (Signed)
 MEDICARE OBSERVATION STATUS NOTIFICATION   Patient Details  Name: MARKITA STCHARLES MRN: [id number] Date of Birth: [birth year]   Medicare Observation Status Notification Given:  No patient discharged    [name] 12/26/2023, 1:10 PM

## 2023-12-26 NOTE — Plan of Care (Signed)
  Problem: Education: Goal: Knowledge of General Education information will improve Description: Including pain rating scale, medication(s)/side effects and non-pharmacologic comfort measures Outcome: Adequate for Discharge   Problem: Health Behavior/Discharge Planning: Goal: Ability to manage health-related needs will improve Outcome: Adequate for Discharge   Problem: Clinical Measurements: Goal: Ability to maintain clinical measurements within normal limits will improve Outcome: Adequate for Discharge Goal: Will remain free from infection Outcome: Adequate for Discharge Goal: Diagnostic test results will improve Outcome: Adequate for Discharge Goal: Respiratory complications will improve Outcome: Adequate for Discharge Goal: Cardiovascular complication will be avoided Outcome: Adequate for Discharge   Problem: Activity: Goal: Risk for activity intolerance will decrease Outcome: Adequate for Discharge   Problem: Nutrition: Goal: Adequate nutrition will be maintained Outcome: Adequate for Discharge   Problem: Coping: Goal: Level of anxiety will decrease Outcome: Adequate for Discharge   Problem: Elimination: Goal: Will not experience complications related to bowel motility Outcome: Adequate for Discharge Goal: Will not experience complications related to urinary retention Outcome: Adequate for Discharge   Problem: Pain Managment: Goal: General experience of comfort will improve and/or be controlled Outcome: Adequate for Discharge   Problem: Safety: Goal: Ability to remain free from injury will improve Outcome: Adequate for Discharge   Problem: Skin Integrity: Goal: Risk for impaired skin integrity will decrease Outcome: Adequate for Discharge   Problem: Education: Goal: Ability to describe self-care measures that may prevent or decrease complications (Diabetes Survival Skills Education) will improve Outcome: Adequate for Discharge Goal: Individualized Educational  Video(s) Outcome: Adequate for Discharge   Problem: Coping: Goal: Ability to adjust to condition or change in health will improve Outcome: Adequate for Discharge   Problem: Fluid Volume: Goal: Ability to maintain a balanced intake and output will improve Outcome: Adequate for Discharge   Problem: Health Behavior/Discharge Planning: Goal: Ability to identify and utilize available resources and services will improve Outcome: Adequate for Discharge Goal: Ability to manage health-related needs will improve Outcome: Adequate for Discharge   Problem: Metabolic: Goal: Ability to maintain appropriate glucose levels will improve Outcome: Adequate for Discharge   Problem: Nutritional: Goal: Maintenance of adequate nutrition will improve Outcome: Adequate for Discharge Goal: Progress toward achieving an optimal weight will improve Outcome: Adequate for Discharge   Problem: Skin Integrity: Goal: Risk for impaired skin integrity will decrease Outcome: Adequate for Discharge   Problem: Tissue Perfusion: Goal: Adequacy of tissue perfusion will improve Outcome: Adequate for Discharge   Problem: Education: Goal: Understanding of CV disease, CV risk reduction, and recovery process will improve Outcome: Adequate for Discharge Goal: Individualized Educational Video(s) Outcome: Adequate for Discharge   Problem: Activity: Goal: Ability to return to baseline activity level will improve Outcome: Adequate for Discharge   Problem: Cardiovascular: Goal: Ability to achieve and maintain adequate cardiovascular perfusion will improve Outcome: Adequate for Discharge Goal: Vascular access site(s) Level 0-1 will be maintained Outcome: Adequate for Discharge   Problem: Health Behavior/Discharge Planning: Goal: Ability to safely manage health-related needs after discharge will improve Outcome: Adequate for Discharge

## 2023-12-26 NOTE — Discharge Summary (Signed)
 [facility]    Discharge Summary    Patient ID:  Wanda Gardner MRN: [id number] DOB/AGE: 10/02/1940 83 y.o.  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 Note: Unchanged Note: Unchanged Formatting of this note might be different from the original. Note: Unchanged Note: Unchanged   Constipation 04/20/2011   Formatting of this note might be different from the original. Note: Unchanged - probably [facility]. Note: Unchanged - probably [facility].   COPD (chronic obstructive pulmonary disease) (HCC)    Cough 10/30/2013   Formatting of this note might be different from the original. Note: Unchanged Note: Unchanged   Depression    Diabetes mellitus (HCC) 01/10/2022   Diabetes mellitus without complication (HCC)    Dyspnea    Glaucoma    Headache    right side of head   Headache 07/25/2011   Hyperlipidemia    Hypertension    Knee stiff 05/12/2022   Lens replaced 03/22/2010   Neuropathic pain of hands and feet    PONV (postoperative nausea and vomiting) 1982   back with my hysterectomy.   Pseudophakia 03/22/2010   Sleep apnea    Doesn't use CPAP   Stiffness of shoulder joint 05/12/2022   Trigger finger of right hand 01/10/2022   Wears dentures    full upper and lower    Procedure(s): Lower Extremity Angiography  Discharged  Condition: good  HPI:  Date of Surgery: 12/25/2023   Surgeon:  Cordella JUDITHANN Shawl, MD.   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  Introduction catheter into left lower extremity 3rd order catheter placement              2.    Contrast injection left lower extremity for distal runoff             3.  Percutaneous transluminal angioplasty and stent placement superficial femoral artery and above-knee popliteal artery.             4.  Thrombectomy of SFA occlusion with the [place] catheter.                5.  Star close closure right common femoral arteriotomy Patient is resting comfortably in bed this morning.  She was sitting up on the side of the bed.  No complications to note.  No complaints overnight.  Patient's vitals are remained stable.  Patient is voiding eating and ambulating well.  Patient is being discharged on aspirin  81 mg daily, Eliquis  5 mg twice daily and pravastatin  40 mg daily.  Patient was instructed and counseled not to skip or miss taking any of these medications as it will interfere with the outcome of her procedure.  I spent greater than 60 minutes  in developing, implementing, teaching, and discharging this patient today.  Hospital Course:  Wanda Gardner is a 83 y.o. female is S/P Left Extubated: POD # 82 Physical Exam:  Alert notes x3, no acute distress Face: Symmetrical.  Tongue is midline. Neck: Trachea is midline.  No swelling or bruising. Cardiovascular: Regular rate and rhythm Pulmonary: Clear to auscultation bilaterally Abdomen: Soft, nontender, nondistended Right groin access: Clean dry and intact.  No swelling or drainage noted Left lower extremity: Thigh soft.  Calf soft.  Extremities warm distally toes.  Hard to palpate pedal pulses however the foot is warm is her good capillary refill. Right lower  extremity: Thigh soft.  Calf soft.  Extremities warm distally toes.  Hard to palpate pedal pulses however the foot is warm is her good capillary refill. Neurological: No deficits noted   Post-op wounds:  clean, dry, intact or healing well  Pt. Ambulating, voiding and taking PO diet without difficulty. Pt pain controlled with PO pain meds.  Labs:  As below  Complications: none  Consults:    Significant Diagnostic Studies: CBC Lab Results  Component Value Date   WBC 6.2 10/20/2023   HGB 9.6 (L) 10/20/2023   HCT 30.4 (L) 10/20/2023   MCV 76.0 (L) 10/20/2023   PLT 399 10/20/2023    BMET    Component Value Date/Time   NA 136 10/20/2023 0515   NA 141 12/14/2022 1032   NA 141 04/09/2011 0323   K 4.3 10/20/2023 0515   K 4.1 04/09/2011 0323   CL 100 10/20/2023 0515   CL 106 04/09/2011 0323   CO2 25 10/20/2023 0515   CO2 26 04/09/2011 0323   GLUCOSE 172 (H) 10/20/2023 0515   GLUCOSE 210 (H) 04/09/2011 0323   BUN 17 12/25/2023 1316   BUN 20 12/14/2022 1032   BUN 16 04/09/2011 0323   CREATININE 1.04 (H) 12/25/2023 1316   CREATININE 0.96 04/09/2011 0323   CALCIUM 8.6 (L) 10/20/2023 0515   CALCIUM 8.2 (L) 04/09/2011 0323   GFRNONAA 54 (L) 12/25/2023 1316   GFRNONAA >60 04/09/2011 0323   GFRAA >60 06/16/2016 1504   GFRAA >60 04/09/2011 0323   COAG Lab Results  Component Value Date   INR 1.4 (H) 06/10/2022   INR 1.00 06/06/2016   INR 0.96 11/10/2015     Disposition:  Discharge to :Home Discharge Instructions     Call MD for:  redness, tenderness, or signs of infection (pain, swelling, bleeding, redness, odor or green/yellow discharge around incision site)   Complete by: As directed    Call MD for:  severe or increased pain, loss or decreased feeling  in affected limb(s)   Complete by: As directed    Call MD for:  temperature >100.5   Complete by: As directed    Discharge instructions   Complete by: As directed    Please restart the Eliquis  tonight  Okay  to shower after 36 hours.  Please remove the right groin bandage before showering and replace with a Band-Aid daily as needed   No dressing needed   Complete by: As directed    Replace only if drainage present   Resume previous diet   Complete by: As directed       Allergies as of 12/26/2023   No Known Allergies      Medication List     TAKE these medications    Accu-Chek Guide Test test strip Generic drug: glucose blood CHECK BLOOD SUGAR TWICE DAILY   Accu-Chek Softclix  Lancets lancets Use with device to check sugars twice daily   albuterol  108 (90 Base) MCG/ACT inhaler Commonly known as: VENTOLIN  HFA Inhale 2 puffs into the lungs every 6 (six) hours as needed for wheezing or shortness of breath.   apixaban  5 MG Tabs tablet Commonly known as: Eliquis  Take 1 tablet (5 mg total) by mouth 2 (two) times daily.   aspirin  EC 81 MG tablet Take 1 tablet (81 mg total) by mouth daily. Swallow whole.   BD Pen Needle Nano U/F 32G X 4 MM Misc Generic drug: Insulin  Pen Needle 1 each by Does not apply route in the morning, at noon, in the evening, and at bedtime.   betamethasone  (augmented) 0.05 % lotion Commonly known as: DIPROLENE  Apply 1 Application topically 2 (two) times daily.   cyanocobalamin  1000 MCG tablet Commonly known as: VITAMIN B12 Take 1,000 mcg by mouth daily.   Dexcom G7 Sensor Misc Use to monitor blood sugar continuously. Change sensor every 10 days as directed. Dx E11.65   docusate sodium  100 MG capsule Commonly known as: COLACE Take 1 capsule (100 mg total) by mouth 2 (two) times daily.   dorzolamide -timolol  2-0.5 % ophthalmic solution Commonly known as: COSOPT  Place 1 drop into both eyes 2 (two) times daily.   doxepin  10 MG capsule Commonly known as: SINEQUAN  TAKE 1 CAPSULE BY MOUTH AT BEDTIME What changed: how much to take   DULoxetine  60 MG capsule Commonly known as: CYMBALTA  TAKE 1 CAPSULE BY MOUTH DAILY   esomeprazole  40 MG  capsule Commonly known as: NEXIUM  Take 1 capsule (40 mg total) by mouth daily.   ferrous sulfate  325 (65 FE) MG tablet Take 325 mg by mouth daily with breakfast.   fluticasone  50 MCG/ACT nasal spray Commonly known as: FLONASE  Place 2 sprays into both nostrils daily as needed for allergies.   furosemide  20 MG tablet Commonly known as: LASIX  TAKE 1 TABLET BY MOUTH DAILY   insulin  lispro 100 UNIT/ML KwikPen Commonly known as: HUMALOG  Inject 2 Units into the skin 3 (three) times daily with meals. If eating and Blood Glucose (BG) 80 or higher inject 2 units for meal coverage and add correction dose per scale. If not eating, correction dose only. BG <150= 0 unit; BG 150-200= 1 unit; BG 201-250= 2 unit; BG 251-300= 3 unit; BG 301-350= 4 unit; BG 351-400= 5 unit; BG >400= 6 unit and Call Primary Care.   lactulose  10 GM/15ML solution Commonly known as: CHRONULAC  Take 15 mLs (10 g total) by mouth 2 (two) times daily as needed for mild constipation, moderate constipation or severe constipation.   Lantus  SoloStar 100 UNIT/ML Solostar Pen Generic drug: insulin  glargine Inject 10 Units into the skin at bedtime.   losartan  50 MG tablet Commonly known as: COZAAR  TAKE 1 TABLET BY MOUTH DAILY   polyethylene glycol 17 g packet Commonly known as: MIRALAX  / GLYCOLAX  Take 17 g by mouth daily.   potassium chloride  SA 20 MEQ tablet Commonly known as: KLOR-CON  M Take 20 mEq by mouth daily.   pravastatin  40 MG tablet Commonly known as: PRAVACHOL  TAKE 1 TABLET BY MOUTH DAILY   pregabalin  75 MG capsule Commonly known as: Lyrica  Take 1 capsule (75 mg total) by mouth 2 (two) times daily.   Vitamin D3 125 MCG (5000 UT) Caps Take 1 capsule (5,000 Units total) by mouth daily.       Verbal and written Discharge instructions given to the patient. Wound care per Discharge AVS  Follow-up Information  Brown, Fallon E, NP Follow up in 3 week(s).   Specialty: Vascular Surgery Why: follow up  after procedure with an ABI Contact information: [address] [place] [address] [phone]                 Signed: Gwendlyn JONELLE Shank, NP  12/26/2023, 10:51 AM

## 2024-01-03 ENCOUNTER — Other Ambulatory Visit: Payer: Self-pay | Admitting: Family

## 2024-01-10 ENCOUNTER — Other Ambulatory Visit (INDEPENDENT_AMBULATORY_CARE_PROVIDER_SITE_OTHER): Payer: Self-pay | Admitting: Vascular Surgery

## 2024-01-10 DIAGNOSIS — I739 Peripheral vascular disease, unspecified: Secondary | ICD-10-CM

## 2024-01-15 ENCOUNTER — Encounter (INDEPENDENT_AMBULATORY_CARE_PROVIDER_SITE_OTHER): Payer: Self-pay | Admitting: Nurse Practitioner

## 2024-01-15 ENCOUNTER — Ambulatory Visit (INDEPENDENT_AMBULATORY_CARE_PROVIDER_SITE_OTHER)

## 2024-01-15 ENCOUNTER — Ambulatory Visit (INDEPENDENT_AMBULATORY_CARE_PROVIDER_SITE_OTHER): Admitting: Nurse Practitioner

## 2024-01-15 VITALS — BP 153/78 | HR 65 | Resp 18

## 2024-01-15 DIAGNOSIS — S91109A Unspecified open wound of unspecified toe(s) without damage to nail, initial encounter: Secondary | ICD-10-CM

## 2024-01-15 DIAGNOSIS — G629 Polyneuropathy, unspecified: Secondary | ICD-10-CM | POA: Diagnosis not present

## 2024-01-15 DIAGNOSIS — Z9889 Other specified postprocedural states: Secondary | ICD-10-CM | POA: Diagnosis not present

## 2024-01-15 DIAGNOSIS — I739 Peripheral vascular disease, unspecified: Secondary | ICD-10-CM

## 2024-01-15 MED ORDER — PREGABALIN 150 MG PO CAPS: 150.0000 mg | ORAL_CAPSULE | Freq: Two times a day (BID) | ORAL | 1 refills | Status: DC

## 2024-01-15 NOTE — Progress Notes (Signed)
 Subjective:    Patient ID: Wanda Gardner, female    DOB: May 15, 1940, 83 y.o.   MRN: [id number] Chief Complaint  Patient presents with   Follow-up    [facility] 3 week with abi    HPI  Discussed the use of AI scribe software for clinical note transcription with the patient, who gave verbal consent to proceed.  History of Present Illness Wanda Gardner is an 83 year old female with peripheral artery disease who presents with severe leg pain and swelling.  She experiences severe leg pain, particularly at night, which disrupts her sleep. The pain has persisted despite previous procedures aimed at improving blood flow. Elevating her legs helps manage the swelling, which is more pronounced in the left leg, especially around the ankle.  She has a history of a procedure to improve blood flow in her left leg due to a blockage in the thigh area. Her blood flow measurements in the left leg were 0.75 previously and 1.13 at the current visit.  She is concerned about a black toe, which previously had a large open ulcer. The toe is still healing, with a small wound present. She has a history of diabetes, which complicates healing. She is dissatisfied with previous podiatric care, citing an incident where a doctor cut her toe while trimming her nails, causing significant bleeding.  She is currently taking Lyrica  at a low dose of 75 mg twice a day for neuropathic pain, which she believes is not effective. She has a history of neuropathy, likely exacerbated by previous poor blood flow. She has also been on gabapentin in the past, but it was not effective. She mentions insurance issues with tramadol , which she describes as ineffective for her pain.  She has a long-standing history of diabetes, which she believes contributes to her neuropathy and foot issues. She has been managing her condition with various medications and has a history of back issues, for which she receives injections. She mentions a previous  recommendation against back surgery due to the risk associated with her age and the location of the problem.    Results DIAGNOSTIC Blood flow measurement: Right leg: 0.96, Left leg: 1.13 (01/15/2024) Waveform analysis: Triphasic waveforms in left leg with normal toe waveforms (01/15/2024)     Review of Systems  Skin:  Positive for wound.  Neurological:  Positive for weakness.  All other systems reviewed and are negative.      Objective:   Physical Exam Vitals reviewed.  HENT:     Head: Normocephalic.  Cardiovascular:     Rate and Rhythm: Normal rate.     Pulses: Normal pulses.  Pulmonary:     Effort: Pulmonary effort is normal.  Skin:    General: Skin is warm and dry.  Neurological:     Mental Status: She is alert and oriented to person, place, and time.     Motor: Weakness present.  Psychiatric:        Mood and Affect: Mood normal.        Behavior: Behavior normal.        Thought Content: Thought content normal.     Physical Exam CARDIOVASCULAR: Triphasic waveforms in the left leg with normal toe waveforms. Strong waveforms in the left leg down to the toes. Right leg blood flow at 0.96, left leg at 1.13. SKIN: Toe wound healing, looks better than last time.  BP (!) 153/78 (BP Location: Right Arm)   Pulse 65   Resp 18   Past  Medical History:  Diagnosis Date   Acid reflux    AKI (acute kidney injury) 06/10/2022   Arthritis    Asthma    Chest pain, unspecified 10/30/2013   Formatting of this note might be different from the original. Note: Unchanged Note: Unchanged Formatting of this note might be different from the original. Note: Unchanged Note: Unchanged   Constipation 04/20/2011   Formatting of this note might be different from the original. Note: Unchanged - probably [facility]. Note: Unchanged - probably [facility].   COPD (chronic obstructive pulmonary disease) (HCC)    Cough 10/30/2013   Formatting of this note might be different from the original. Note: Unchanged  Note: Unchanged   Depression    Diabetes mellitus (HCC) 01/10/2022   Diabetes mellitus without complication (HCC)    Dyspnea    Glaucoma    Headache    right side of head   Headache 07/25/2011   Hyperlipidemia    Hypertension    Knee stiff 05/12/2022   Lens replaced 03/22/2010   Neuropathic pain of hands and feet    PONV (postoperative nausea and vomiting) 1982   back with my hysterectomy.   Pseudophakia 03/22/2010   Sleep apnea    Doesn't use CPAP   Stiffness of shoulder joint 05/12/2022   Trigger finger of right hand 01/10/2022   Wears dentures    full upper and lower    Social History   Socioeconomic History   Marital status: Divorced    Spouse name: Not on file   Number of children: Not on file   Years of education: Not on file   Highest education level: Not on file  Occupational History   Not on file  Tobacco Use   Smoking status: Every Day    Current packs/day: 0.50    Average packs/day: 0.5 packs/day for 50.0 years (25.0 ttl pk-yrs)    Types: Cigarettes   Smokeless tobacco: Never  Vaping Use   Vaping status: Never Used  Substance and Sexual Activity   Alcohol use: Yes    Alcohol/week: 0.0 standard drinks of alcohol    Comment: 1 glass of wine twice a year   Drug use: Yes    Types: Marijuana    Comment: weed- daily use for pain relief when she can get  it   Sexual activity: Not Currently  Other Topics Concern   Not on file  Social History Narrative   Not on file   Social Drivers of Health   Financial Resource Strain: High Risk (10/29/2023)   Overall Financial Resource Strain (CARDIA)    Difficulty of Paying Living Expenses: Hard  Food Insecurity: Food Insecurity Present (12/25/2023)   Hunger Vital Sign    Worried About Running Out of Food in the Last Year: Sometimes true    Ran Out of Food in the Last Year: Sometimes true  Transportation Needs: No Transportation Needs (12/25/2023)   PRAPARE - Administrator, Civil Service (Medical): No     Lack of Transportation (Non-Medical): No  Physical Activity: Not on file  Stress: Not on file  Social Connections: Moderately Isolated (12/25/2023)   Social Connection and Isolation Panel    Frequency of Communication with Friends and Family: More than three times a week    Frequency of Social Gatherings with Friends and Family: More than three times a week    Attends Religious Services: 1 to 4 times per year    Active Member of Clubs or Organizations: No  Attends Banker Meetings: Never    Marital Status: Widowed  Intimate Partner Violence: Not At Risk (12/25/2023)   Humiliation, Afraid, Rape, and Kick questionnaire    Fear of Current or Ex-Partner: No    Emotionally Abused: No    Physically Abused: No    Sexually Abused: No    Past Surgical History:  Procedure Laterality Date   ABDOMINAL HYSTERECTOMY     CATARACT EXTRACTION W/ INTRAOCULAR LENS  IMPLANT, BILATERAL     CATARACT EXTRACTION W/PHACO Left 12/14/2021   Procedure: CATARACT EXTRACTION PHACO AND INTRAOCULAR LENS PLACEMENT (IOC) LEFT DIABETIC 24.79 02:23.9;  Surgeon: Mittie Gaskin, MD;  Location: [facility];  Service: Ophthalmology;  Laterality: Left;  Diabetic   CHOLECYSTECTOMY     COLONOSCOPY WITH PROPOFOL  N/A 10/29/2014   Procedure: COLONOSCOPY WITH PROPOFOL ;  Surgeon: Deward CINDERELLA [facility], MD;  Location: [facility];  Service: Gastroenterology;  Laterality: N/A;   ESOPHAGOGASTRODUODENOSCOPY (EGD) WITH PROPOFOL  N/A 10/29/2014   Procedure: ESOPHAGOGASTRODUODENOSCOPY (EGD) WITH PROPOFOL ;  Surgeon: Deward CINDERELLA [facility], MD;  Location: [facility] ENDOSCOPY;  Service: Gastroenterology;  Laterality: N/A;   EYE SURGERY Bilateral 2011   lid lift done 3 times   FOOT SURGERY     HAND SURGERY     LOWER EXTREMITY ANGIOGRAPHY Left 10/19/2023   Procedure: Lower Extremity Angiography;  Surgeon: Jama Cordella MATSU, MD;  Location: [facility] INVASIVE CV LAB;  Service: Cardiovascular;  Laterality: Left;   LOWER EXTREMITY ANGIOGRAPHY Left  12/25/2023   Procedure: Lower Extremity Angiography;  Surgeon: Jama Cordella MATSU, MD;  Location: [facility];  Service: Cardiovascular;  Laterality: Left;   LOWER EXTREMITY INTERVENTION Left 10/19/2023   Procedure: LOWER EXTREMITY INTERVENTION;  Surgeon: Jama Cordella MATSU, MD;  Location: [facility];  Service: Cardiovascular;  Laterality: Left;   TONGUE SURGERY     removal of cancer   TONSILLECTOMY     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/13/2016   Procedure: TOTAL HIP ARTHROPLASTY ANTERIOR APPROACH;  Surgeon: Ozell Flake, MD;  Location: [facility] ORS;  Service: Orthopedics;  Laterality: Right;   TOTAL KNEE ARTHROPLASTY Right 11/23/2015   Procedure: TOTAL KNEE ARTHROPLASTY;  Surgeon: Ozell Flake, MD;  Location: [facility] OR[facility];  Service: Orthopedics;  Laterality: Right;   VOCAL CORD LATERALIZATION, ENDOSCOPIC APPROACH W/ MLB      Family History  Problem Relation Age of Onset   Cancer Father    Heart disease Mother     No Known Allergies     Latest Ref Rng & Units 10/20/2023    5:15 AM 10/19/2023    9:16 AM 10/18/2023    1:57 AM  CBC  WBC 4.0 - 10.5 K/uL 6.2  5.0  7.3   Hemoglobin 12.0 - 15.0 g/dL 9.6  89.8  8.7   Hematocrit 36.0 - 46.0 % 30.4  32.2  27.5   Platelets 150 - 400 K/uL 399  395  402       CMP     Component Value Date/Time   NA 136 10/20/2023 0515   NA 141 12/14/2022 1032   NA 141 04/09/2011 0323   K 4.3 10/20/2023 0515   K 4.1 04/09/2011 0323   CL 100 10/20/2023 0515   CL 106 04/09/2011 0323   CO2 25 10/20/2023 0515   CO2 26 04/09/2011 0323   GLUCOSE 172 (H) 10/20/2023 0515   GLUCOSE 210 (H) 04/09/2011 0323   BUN 17 12/25/2023 1316   BUN 20 12/14/2022 1032   BUN 16 04/09/2011 0323   CREATININE 1.04 (  H) 12/25/2023 1316   CREATININE 0.96 04/09/2011 0323   CALCIUM 8.6 (L) 10/20/2023 0515   CALCIUM 8.2 (L) 04/09/2011 0323   PROT 6.5 10/15/2023 1535   PROT 6.8 12/14/2022 1032   PROT 7.2 04/08/2011 1928   ALBUMIN 3.2 (L) 10/15/2023 1535   ALBUMIN 4.1  12/14/2022 1032   ALBUMIN 3.9 04/08/2011 1928   AST 18 10/15/2023 1535   AST 18 04/08/2011 1928   ALT 12 10/15/2023 1535   ALT 23 04/08/2011 1928   ALKPHOS 63 10/15/2023 1535   ALKPHOS 99 04/08/2011 1928   BILITOT 0.4 10/15/2023 1535   BILITOT 0.2 12/14/2022 1032   BILITOT 0.3 04/08/2011 1928   EGFR 44 (L) 12/14/2022 1032   GFRNONAA 54 (L) 12/25/2023 1316   GFRNONAA >60 04/09/2011 0323     VAS US  ABI WITH/WO TBI Result Date: 11/23/2023  LOWER EXTREMITY DOPPLER STUDY Patient Name:  ROSELEE TAYLOE  Date of Exam:   11/22/2023 Medical Rec #: [id number]        Accession #:    [id number] Date of Birth: 31-Dec-1940        Patient Gender: F Patient Age:   74 years Exam Location:  [facility] Procedure:      VAS US  ABI WITH/WO TBI Referring Phys: GREGORY SCHNIER --------------------------------------------------------------------------------  Indications: Claudication, and peripheral artery disease. High Risk Factors: Hyperlipidemia.  Vascular Interventions: 10/19/2023 Bilat CIA PTA and stents. Comparison Study: 08/2023 Performing Technologist: Jerel Croak RVT  Examination Guidelines: A complete evaluation includes at minimum, Doppler waveform signals and systolic blood pressure reading at the level of bilateral brachial, anterior tibial, and posterior tibial arteries, when vessel segments are accessible. Bilateral testing is considered an integral part of a complete examination. Photoelectric Plethysmograph (PPG) waveforms and toe systolic pressure readings are included as required and additional duplex testing as needed. Limited examinations for reoccurring indications may be performed as noted.  ABI Findings: +---------+------------------+-----+--------+--------+ Right    Rt Pressure (mmHg)IndexWaveformComment  +---------+------------------+-----+--------+--------+ Brachial 130                                     +---------+------------------+-----+--------+--------+ PTA      114                0.88 biphasic         +---------+------------------+-----+--------+--------+ DP       129               0.99 biphasic         +---------+------------------+-----+--------+--------+ Great Toe94                0.72 Abnormal         +---------+------------------+-----+--------+--------+ +---------+------------------+-----+----------+-------+ Left     Lt Pressure (mmHg)IndexWaveform  Comment +---------+------------------+-----+----------+-------+ Brachial 127                                      +---------+------------------+-----+----------+-------+ PTA      86                0.66 monophasic        +---------+------------------+-----+----------+-------+ DP       97                0.75 monophasic        +---------+------------------+-----+----------+-------+ Burnetta Bon  0.70 Abnormal          +---------+------------------+-----+----------+-------+ +-------+-----------+-----------+------------+------------+ ABI/TBIToday's ABIToday's TBIPrevious ABIPrevious TBI +-------+-----------+-----------+------------+------------+ Right  .99        .72        .84         .47          +-------+-----------+-----------+------------+------------+ Left   .75        .70        .24         0            +-------+-----------+-----------+------------+------------+  Bilateral ABIs and TBIs appear increased compared to prior study on 08/2023.  Summary: Right: Resting right ankle-brachial index is within normal range. The right toe-brachial index is normal.  Left: Resting left ankle-brachial index indicates moderate left lower extremity arterial disease. The left toe-brachial index is normal.  *See table(s) above for measurements and observations.  Electronically signed by Cordella Shawl MD on 11/23/2023 at 9:35:25 AM.    Final        Assessment & Plan:   1. 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 Improved blood flow with ABI of 1.13 and strong triphasic waveforms. Swelling likely due to increased blood flow. - Advised leg elevation at home. - Recommended use of compression socks. -Will return in 3 months for non invasive studies - VAS US  ABI WITH/WO TBI; Future - VAS US  LOWER EXTREMITY ARTERIAL DUPLEX; Future 2. Open wound of toe, initial encounter Chronic ulcer of left toe Chronic ulcer on the left toe with some healing. Good blood flow supports healing. Previous negative experiences with podiatrists noted. - Referred to a new podiatrist, preferably Dr. Silva or Dr. Janit, for further evaluation and management. - Ambulatory referral to Podiatry  3. Neuropathy  Peripheral neuropathy of left foot Neuropathic pain in the left foot likely due to nerve damage from previous poor blood flow. Lyrica  75 mg twice daily provided no significant relief. Gabapentin previously ineffective. Potential need for neurology consultation if pain persists. - Increased Lyrica  to 150 mg twice daily. - Referred to neurology for further evaluation and management. - Encouraged follow-up for potential back-related pain management. - Ambulatory referral to Neurology     Current Outpatient Medications on File Prior to Visit  Medication Sig Dispense Refill   Accu-Chek Softclix Lancets lancets Use with device to check sugars twice daily 100 each 12   albuterol  (VENTOLIN  HFA) 108 (90 Base) MCG/ACT inhaler Inhale 2 puffs into the lungs every 6 (six) hours as needed for wheezing or shortness of breath. 8 g 2   apixaban  (ELIQUIS ) 5 MG TABS tablet Take 1 tablet (5 mg total) by mouth 2 (two) times daily. 60 tablet 0   aspirin  EC 81 MG tablet Take 1 tablet (81 mg total) by mouth daily. Swallow whole. 30 tablet 0   betamethasone , augmented, (DIPROLENE ) 0.05 % lotion Apply 1 Application topically 2 (two) times  daily.     Cholecalciferol  (VITAMIN D3) 125 MCG (5000 UT) CAPS Take 1 capsule (5,000 Units total) by mouth daily. 90 capsule 1   Continuous Glucose Sensor (DEXCOM G7 SENSOR) MISC Use to monitor blood sugar continuously. Change sensor every 10 days as directed. Dx E11.65 3 each 2   docusate sodium  (COLACE) 100 MG capsule Take 1 capsule (100 mg total) by mouth 2 (two) times daily. 60 capsule 0   dorzolamide -timolol  (COSOPT ) 2-0.5 %  ophthalmic solution Place 1 drop into both eyes 2 (two) times daily.     doxepin  (SINEQUAN ) 10 MG capsule TAKE 1 CAPSULE BY MOUTH AT BEDTIME (Patient taking differently: Take 1,020 mg by mouth at bedtime.) 30 capsule 0   DULoxetine  (CYMBALTA ) 60 MG capsule TAKE 1 CAPSULE BY MOUTH DAILY 90 capsule 2   esomeprazole  (NEXIUM ) 40 MG capsule Take 1 capsule (40 mg total) by mouth daily. 90 capsule 3   ferrous sulfate  325 (65 FE) MG tablet Take 325 mg by mouth daily with breakfast.     fluticasone  (FLONASE ) 50 MCG/ACT nasal spray Place 2 sprays into both nostrils daily as needed for allergies. 16 g 11   furosemide  (LASIX ) 20 MG tablet TAKE 1 TABLET BY MOUTH DAILY 90 tablet 1   glucose blood (ACCU-CHEK GUIDE TEST) test strip CHECK BLOOD SUGAR TWICE DAILY 200 each 1   insulin  glargine (LANTUS  SOLOSTAR) 100 UNIT/ML Solostar Pen Inject 10 Units into the skin at bedtime. 9 mL 2   insulin  lispro (HUMALOG ) 100 UNIT/ML KwikPen Inject 2 Units into the skin 3 (three) times daily with meals. If eating and Blood Glucose (BG) 80 or higher inject 2 units for meal coverage and add correction dose per scale. If not eating, correction dose only. BG <150= 0 unit; BG 150-200= 1 unit; BG 201-250= 2 unit; BG 251-300= 3 unit; BG 301-350= 4 unit; BG 351-400= 5 unit; BG >400= 6 unit and Call Primary Care.     Insulin  Pen Needle (BD PEN NEEDLE NANO U/F) 32G X 4 MM MISC 1 each by Does not apply route in the morning, at noon, in the evening, and at bedtime. 200 each 11   losartan  (COZAAR ) 50 MG tablet TAKE 1  TABLET BY MOUTH DAILY 90 tablet 1   metFORMIN  (GLUCOPHAGE ) 500 MG tablet TAKE 1 TABLET BY MOUTH TWICE A DAY 180 tablet 1   polyethylene glycol (MIRALAX  / GLYCOLAX ) 17 g packet Take 17 g by mouth daily. 30 each 0   potassium chloride  SA (K-DUR,KLOR-CON ) 20 MEQ tablet Take 20 mEq by mouth daily.     pravastatin  (PRAVACHOL ) 40 MG tablet TAKE 1 TABLET BY MOUTH DAILY 90 tablet 3   vitamin B-12 (CYANOCOBALAMIN ) 1000 MCG tablet Take 1,000 mcg by mouth daily.     lactulose  (CHRONULAC ) 10 GM/15ML solution Take 15 mLs (10 g total) by mouth 2 (two) times daily as needed for mild constipation, moderate constipation or severe constipation. (Patient not taking: Reported on 01/15/2024) 236 mL 0   No current facility-administered medications on file prior to visit.    There are no Patient Instructions on file for this visit. Return in about 3 months (around 04/16/2024).   Kinleigh Nault E Danyael Alipio, NP

## 2024-01-16 LAB — VAS US ABI WITH/WO TBI
Left ABI: 1.13
Right ABI: 0.96

## 2024-01-21 ENCOUNTER — Encounter (INDEPENDENT_AMBULATORY_CARE_PROVIDER_SITE_OTHER): Payer: Self-pay | Admitting: Nurse Practitioner

## 2024-01-23 ENCOUNTER — Other Ambulatory Visit: Payer: Self-pay | Admitting: Family

## 2024-02-04 ENCOUNTER — Other Ambulatory Visit (INDEPENDENT_AMBULATORY_CARE_PROVIDER_SITE_OTHER): Payer: Self-pay | Admitting: Nurse Practitioner

## 2024-02-04 ENCOUNTER — Telehealth (INDEPENDENT_AMBULATORY_CARE_PROVIDER_SITE_OTHER): Payer: Self-pay

## 2024-02-04 DIAGNOSIS — L609 Nail disorder, unspecified: Secondary | ICD-10-CM

## 2024-02-04 NOTE — Telephone Encounter (Signed)
 Referral sent

## 2024-02-04 NOTE — Telephone Encounter (Signed)
 Patient sister requested for referral to sent to [facility]. She states that she is the primary transportation for the patient.

## 2024-02-04 NOTE — Telephone Encounter (Signed)
 Well is she willing to go to [place]? Or to [place] or [facility].  She also doesn't wish to go to [facility], so where would she like to go?

## 2024-02-04 NOTE — Telephone Encounter (Signed)
 Patient sister has been notified that referral has been placed and she will informed the patient

## 2024-02-04 NOTE — Telephone Encounter (Signed)
 Patient sister left a message stating that the patient does not want to continue with [facility] [address] and is requesting a referral to a different podiatry office. Patient sister did informed that the patient is currently losing her toenails but did not specify which foot. Please Advise

## 2024-02-10 ENCOUNTER — Emergency Department

## 2024-02-10 ENCOUNTER — Observation Stay

## 2024-02-10 ENCOUNTER — Observation Stay
Admission: EM | Admit: 2024-02-10 | Discharge: 2024-02-13 | Disposition: A | Discharge status: Home Health | Attending: Internal Medicine | Admitting: Internal Medicine

## 2024-02-10 ENCOUNTER — Other Ambulatory Visit: Payer: Self-pay

## 2024-02-10 DIAGNOSIS — N1831 Chronic kidney disease, stage 3a: Secondary | ICD-10-CM | POA: Diagnosis present

## 2024-02-10 DIAGNOSIS — D649 Anemia, unspecified: Secondary | ICD-10-CM | POA: Insufficient documentation

## 2024-02-10 DIAGNOSIS — J449 Chronic obstructive pulmonary disease, unspecified: Secondary | ICD-10-CM | POA: Insufficient documentation

## 2024-02-10 DIAGNOSIS — Z7982 Long term (current) use of aspirin: Secondary | ICD-10-CM | POA: Diagnosis not present

## 2024-02-10 DIAGNOSIS — E1165 Type 2 diabetes mellitus with hyperglycemia: Secondary | ICD-10-CM | POA: Diagnosis present

## 2024-02-10 DIAGNOSIS — I129 Hypertensive chronic kidney disease with stage 1 through stage 4 chronic kidney disease, or unspecified chronic kidney disease: Secondary | ICD-10-CM | POA: Diagnosis not present

## 2024-02-10 DIAGNOSIS — R7989 Other specified abnormal findings of blood chemistry: Secondary | ICD-10-CM | POA: Diagnosis present

## 2024-02-10 DIAGNOSIS — Z794 Long term (current) use of insulin: Secondary | ICD-10-CM | POA: Insufficient documentation

## 2024-02-10 DIAGNOSIS — D631 Anemia in chronic kidney disease: Secondary | ICD-10-CM | POA: Insufficient documentation

## 2024-02-10 DIAGNOSIS — E782 Mixed hyperlipidemia: Secondary | ICD-10-CM | POA: Diagnosis present

## 2024-02-10 DIAGNOSIS — I4891 Unspecified atrial fibrillation: Secondary | ICD-10-CM | POA: Diagnosis present

## 2024-02-10 DIAGNOSIS — E1122 Type 2 diabetes mellitus with diabetic chronic kidney disease: Secondary | ICD-10-CM | POA: Diagnosis not present

## 2024-02-10 DIAGNOSIS — F17210 Nicotine dependence, cigarettes, uncomplicated: Secondary | ICD-10-CM | POA: Insufficient documentation

## 2024-02-10 DIAGNOSIS — I480 Paroxysmal atrial fibrillation: Secondary | ICD-10-CM | POA: Insufficient documentation

## 2024-02-10 DIAGNOSIS — I70209 Unspecified atherosclerosis of native arteries of extremities, unspecified extremity: Secondary | ICD-10-CM | POA: Insufficient documentation

## 2024-02-10 DIAGNOSIS — R296 Repeated falls: Secondary | ICD-10-CM | POA: Insufficient documentation

## 2024-02-10 DIAGNOSIS — Z7984 Long term (current) use of oral hypoglycemic drugs: Secondary | ICD-10-CM | POA: Insufficient documentation

## 2024-02-10 DIAGNOSIS — Z7901 Long term (current) use of anticoagulants: Secondary | ICD-10-CM | POA: Diagnosis not present

## 2024-02-10 DIAGNOSIS — R7402 Elevation of levels of lactic acid dehydrogenase (LDH): Secondary | ICD-10-CM | POA: Diagnosis not present

## 2024-02-10 DIAGNOSIS — R531 Weakness: Principal | ICD-10-CM | POA: Insufficient documentation

## 2024-02-10 DIAGNOSIS — I10 Essential (primary) hypertension: Secondary | ICD-10-CM | POA: Diagnosis present

## 2024-02-10 DIAGNOSIS — Z96641 Presence of right artificial hip joint: Secondary | ICD-10-CM | POA: Insufficient documentation

## 2024-02-10 DIAGNOSIS — Z96651 Presence of right artificial knee joint: Secondary | ICD-10-CM | POA: Insufficient documentation

## 2024-02-10 DIAGNOSIS — M25551 Pain in right hip: Secondary | ICD-10-CM | POA: Diagnosis not present

## 2024-02-10 DIAGNOSIS — J189 Pneumonia, unspecified organism: Principal | ICD-10-CM | POA: Diagnosis present

## 2024-02-10 LAB — COMPREHENSIVE METABOLIC PANEL WITH GFR
ALT: 13 U/L (ref 0–44)
AST: 31 U/L (ref 15–41)
Albumin: 3.9 g/dL (ref 3.5–5.0)
Alkaline Phosphatase: 113 U/L (ref 38–126)
Anion gap: 9 (ref 5–15)
BUN: 19 mg/dL (ref 8–23)
CO2: 30 mmol/L (ref 22–32)
Calcium: 9.4 mg/dL (ref 8.9–10.3)
Chloride: 99 mmol/L (ref 98–111)
Creatinine, Ser: 1.01 mg/dL — ABNORMAL HIGH (ref 0.44–1.00)
GFR, Estimated: 55 mL/min — ABNORMAL LOW
Glucose, Bld: 168 mg/dL — ABNORMAL HIGH (ref 70–99)
Potassium: 4.1 mmol/L (ref 3.5–5.1)
Sodium: 138 mmol/L (ref 135–145)
Total Bilirubin: 0.2 mg/dL (ref 0.0–1.2)
Total Protein: 6.9 g/dL (ref 6.5–8.1)

## 2024-02-10 LAB — CBC
HCT: 30.8 % — ABNORMAL LOW (ref 36.0–46.0)
Hemoglobin: 9.6 g/dL — ABNORMAL LOW (ref 12.0–15.0)
MCH: 27.2 pg (ref 26.0–34.0)
MCHC: 31.2 g/dL (ref 30.0–36.0)
MCV: 87.3 fL (ref 80.0–100.0)
Platelets: 344 K/uL (ref 150–400)
RBC: 3.53 MIL/uL — ABNORMAL LOW (ref 3.87–5.11)
RDW: 14.2 % (ref 11.5–15.5)
WBC: 7.2 K/uL (ref 4.0–10.5)
nRBC: 0 % (ref 0.0–0.2)

## 2024-02-10 LAB — LACTIC ACID, PLASMA: Lactic Acid, Venous: 2.4 mmol/L (ref 0.5–1.9)

## 2024-02-10 LAB — PROTIME-INR
INR: 1.1 (ref 0.8–1.2)
Prothrombin Time: 14.7 s (ref 11.4–15.2)

## 2024-02-10 LAB — URINALYSIS, ROUTINE W REFLEX MICROSCOPIC
Bacteria, UA: NONE SEEN
Bilirubin Urine: NEGATIVE
Glucose, UA: NEGATIVE mg/dL
Ketones, ur: NEGATIVE mg/dL
Nitrite: NEGATIVE
Protein, ur: NEGATIVE mg/dL
Specific Gravity, Urine: 1.009 (ref 1.005–1.030)
WBC, UA: >50 WBC/hpf (ref 0–5)
pH: 5 (ref 5.0–8.0)

## 2024-02-10 LAB — RESP PANEL BY RT-PCR (RSV, FLU A&B, COVID)  RVPGX2
Influenza A by PCR: NEGATIVE
Influenza B by PCR: NEGATIVE
Resp Syncytial Virus by PCR: NEGATIVE
SARS Coronavirus 2 by RT PCR: NEGATIVE

## 2024-02-10 LAB — PROCALCITONIN: Procalcitonin: <0.1 ng/mL

## 2024-02-10 LAB — GLUCOSE, CAPILLARY: Glucose-Capillary: 242 mg/dL — ABNORMAL HIGH (ref 70–99)

## 2024-02-10 LAB — CBG MONITORING, ED: Glucose-Capillary: 162 mg/dL — ABNORMAL HIGH (ref 70–99)

## 2024-02-10 LAB — MAGNESIUM: Magnesium: 2 mg/dL (ref 1.7–2.4)

## 2024-02-10 MED ORDER — INSULIN ASPART 100 UNIT/ML IJ SOLN
0.0000 [IU] | Freq: Three times a day (TID) | INTRAMUSCULAR | Status: DC
  Administered 2024-02-11: 3 [IU] via SUBCUTANEOUS
  Administered 2024-02-11 (×2): 1 [IU] via SUBCUTANEOUS
  Administered 2024-02-12 (×3): 2 [IU] via SUBCUTANEOUS
  Administered 2024-02-13: 5 [IU] via SUBCUTANEOUS
  Administered 2024-02-13: 1 [IU] via SUBCUTANEOUS
  Filled 2024-02-10: qty 2
  Filled 2024-02-10: qty 1
  Filled 2024-02-10: qty 2
  Filled 2024-02-10: qty 5
  Filled 2024-02-10 (×2): qty 1
  Filled 2024-02-10: qty 3
  Filled 2024-02-10: qty 2

## 2024-02-10 MED ORDER — IPRATROPIUM-ALBUTEROL 0.5-2.5 (3) MG/3ML IN SOLN: 3.0000 mL | Freq: Four times a day (QID) | RESPIRATORY_TRACT | Status: DC | PRN

## 2024-02-10 MED ORDER — SENNOSIDES-DOCUSATE SODIUM 8.6-50 MG PO TABS: 1.0000 | ORAL_TABLET | Freq: Every evening | ORAL | Status: DC | PRN

## 2024-02-10 MED ORDER — LACTATED RINGERS IV SOLN: INTRAVENOUS | Status: DC

## 2024-02-10 MED ORDER — SODIUM CHLORIDE 0.9% FLUSH
3.0000 mL | Freq: Two times a day (BID) | INTRAVENOUS | Status: DC
  Administered 2024-02-10 – 2024-02-13 (×4): 3 mL via INTRAVENOUS

## 2024-02-10 MED ORDER — DULOXETINE HCL 30 MG PO CPEP
60.0000 mg | ORAL_CAPSULE | Freq: Every day | ORAL | Status: DC
  Administered 2024-02-11 – 2024-02-13 (×3): 60 mg via ORAL
  Filled 2024-02-10 (×4): qty 2

## 2024-02-10 MED ORDER — SODIUM CHLORIDE 0.9 % IV SOLN
500.0000 mg | Freq: Once | INTRAVENOUS | Status: AC
  Administered 2024-02-10: 500 mg via INTRAVENOUS
  Filled 2024-02-10: qty 5

## 2024-02-10 MED ORDER — ONDANSETRON HCL 4 MG/2ML IJ SOLN: 4.0000 mg | Freq: Four times a day (QID) | INTRAMUSCULAR | Status: DC | PRN

## 2024-02-10 MED ORDER — ACETAMINOPHEN 325 MG PO TABS
650.0000 mg | ORAL_TABLET | Freq: Four times a day (QID) | ORAL | Status: DC | PRN
  Administered 2024-02-11 – 2024-02-12 (×3): 650 mg via ORAL
  Filled 2024-02-10 (×3): qty 2

## 2024-02-10 MED ORDER — SODIUM CHLORIDE 0.9 % IV BOLUS
1000.0000 mL | Freq: Once | INTRAVENOUS | Status: AC
  Administered 2024-02-10: 1000 mL via INTRAVENOUS

## 2024-02-10 MED ORDER — FUROSEMIDE 20 MG PO TABS: 20.0000 mg | ORAL_TABLET | Freq: Every day | ORAL | Status: DC

## 2024-02-10 MED ORDER — APIXABAN 5 MG PO TABS
5.0000 mg | ORAL_TABLET | Freq: Two times a day (BID) | ORAL | Status: DC
  Administered 2024-02-10 – 2024-02-13 (×6): 5 mg via ORAL
  Filled 2024-02-10 (×6): qty 1

## 2024-02-10 MED ORDER — INSULIN ASPART 100 UNIT/ML IJ SOLN
0.0000 [IU] | Freq: Every day | INTRAMUSCULAR | Status: DC
  Administered 2024-02-10: 2 [IU] via SUBCUTANEOUS
  Filled 2024-02-10: qty 2

## 2024-02-10 MED ORDER — SODIUM CHLORIDE 0.9 % IV SOLN
1.0000 g | Freq: Once | INTRAVENOUS | Status: AC
  Administered 2024-02-10: 1 g via INTRAVENOUS
  Filled 2024-02-10: qty 10

## 2024-02-10 MED ORDER — ACETAMINOPHEN 650 MG RE SUPP: 650.0000 mg | Freq: Four times a day (QID) | RECTAL | Status: DC | PRN

## 2024-02-10 MED ORDER — PREGABALIN 75 MG PO CAPS
150.0000 mg | ORAL_CAPSULE | Freq: Two times a day (BID) | ORAL | Status: DC
  Administered 2024-02-10 – 2024-02-13 (×6): 150 mg via ORAL
  Filled 2024-02-10 (×6): qty 2

## 2024-02-10 MED ORDER — PRAVASTATIN SODIUM 20 MG PO TABS
40.0000 mg | ORAL_TABLET | Freq: Every day | ORAL | Status: DC
  Administered 2024-02-11 – 2024-02-13 (×3): 40 mg via ORAL
  Filled 2024-02-10 (×4): qty 2

## 2024-02-10 MED ORDER — ONDANSETRON HCL 4 MG PO TABS: 4.0000 mg | ORAL_TABLET | Freq: Four times a day (QID) | ORAL | Status: DC | PRN

## 2024-02-10 MED ORDER — PANTOPRAZOLE SODIUM 40 MG PO TBEC
40.0000 mg | DELAYED_RELEASE_TABLET | Freq: Every day | ORAL | Status: DC
  Administered 2024-02-11 – 2024-02-13 (×3): 40 mg via ORAL
  Filled 2024-02-10 (×4): qty 1

## 2024-02-10 MED ORDER — ASPIRIN 81 MG PO TBEC
81.0000 mg | DELAYED_RELEASE_TABLET | Freq: Every day | ORAL | Status: DC
  Administered 2024-02-11 – 2024-02-13 (×3): 81 mg via ORAL
  Filled 2024-02-10 (×4): qty 1

## 2024-02-10 NOTE — ED Notes (Signed)
" °  Acuity level changed due to lactic result of 2.4. MD Jessup informed. "

## 2024-02-10 NOTE — H&P (Signed)
 " History and Physical    BOLUWATIFE MUTCHLER FMW:[id number] DOB: [birth year] DOA: 02/10/2024  DOS: the patient was seen and examined on 02/10/2024  PCP: Orlean Alan HERO, FNP   Patient coming from: Home  I have personally briefly reviewed patient's old medical records in [facility] and CareEverywhere  HPI:   TAYLIN MANS is a 83 y.o. year old female with medical history of hypertension, hyperlipidemia complicated by PAD, type 2 diabetes, atrial fibrillation, CKD 3A presenting to the ED with generalized weakness and multiple falls.  Patient reports feeling weak.  States she had multiple falls yesterday and reports frequent falls.  She is unable to give significant details into her falls but states 1 time she slipped from her bed and other times it feels like her legs gave out.  She denies any dizziness or lightheadedness.  States she has a walker but does not use it consistently.  She reports coughing with no sputum production.  She denies any fevers or chills.  She is unable to give me the duration of her cough.  She denies any dysuria.  She reports right hip pain that has been present since her fall.  Patient states she lives by herself but sister is at bedside who states her daughter helps her when she is able to and from her knowledge she resides with her.   On arrival to the ED patient was noted to be HDS stable.  Lab work and imaging performed.  CBC without leukocytosis, mild anemia that is at baseline.  CMP with mild hyperglycemia renal function at baseline and no abnormalities otherwise.  Lactic acid elevated at 2.4 with repeat pending.  UA ordered and pending.  Blood cultures obtained.  Respiratory panel negative for COVID, RSV, flu.  Chest x-ray obtained that showed bibasilar opacities concerning for pneumonia versus atelectasis.  Given her presentation, [facility] contacted for admission.  Review of Systems: As mentioned in the history of present illness. All other systems reviewed and  are negative.   Past Medical History:  Diagnosis Date   Acid reflux    AKI (acute kidney injury) 06/10/2022   Arthritis    Asthma    Chest pain, unspecified 10/30/2013   Formatting of this note might be different from the original. Note: Unchanged Note: Unchanged Formatting of this note might be different from the original. Note: Unchanged Note: Unchanged   Constipation 04/20/2011   Formatting of this note might be different from the original. Note: Unchanged - probably CIC. Note: Unchanged - probably CIC.   COPD (chronic obstructive pulmonary disease) (HCC)    Cough 10/30/2013   Formatting of this note might be different from the original. Note: Unchanged Note: Unchanged   Depression    Diabetes mellitus (HCC) 01/10/2022   Diabetes mellitus without complication (HCC)    Dyspnea    Glaucoma    Headache    right side of head   Headache 07/25/2011   Hyperlipidemia    Hypertension    Knee stiff 05/12/2022   Lens replaced 03/22/2010   Neuropathic pain of hands and feet    PONV (postoperative nausea and vomiting) 1982   back with my hysterectomy.   Pseudophakia 03/22/2010   Sleep apnea    Doesn't use CPAP   Stiffness of shoulder joint 05/12/2022   Trigger finger of right hand 01/10/2022   Wears dentures    full upper and lower    Past Surgical History:  Procedure Laterality Date   ABDOMINAL HYSTERECTOMY  CATARACT EXTRACTION W/ INTRAOCULAR LENS  IMPLANT, BILATERAL     CATARACT EXTRACTION W/PHACO Left 12/14/2021   Procedure: CATARACT EXTRACTION PHACO AND INTRAOCULAR LENS PLACEMENT (IOC) LEFT DIABETIC 24.79 02:23.9;  Surgeon: Mittie Gaskin, MD;  Location: [facility];  Service: Ophthalmology;  Laterality: Left;  Diabetic   CHOLECYSTECTOMY     COLONOSCOPY WITH PROPOFOL  N/A 10/29/2014   Procedure: COLONOSCOPY WITH PROPOFOL ;  Surgeon: Deward CINDERELLA Piedmont, MD;  Location[facility] END[facility];  Service: Gastroenterology;  Laterality: N/A;   ESOPHAGOGASTRODUODENOSCOPY (EGD) WITH  PROPOFOL  N/A 10/29/2014   Procedure: ESOPHAGOGASTRODUODENOSCOPY (EGD) WITH PROPOFOL ;  Surgeon: Deward CINDERELLA Piedmont, MD;  Location: [facility] ENDOSCOPY;  Service: Gastroenterology;  Laterality: N/A;   EYE SURGERY Bilateral 2011   lid lift done 3 times   FOOT SURGERY     HAND SURGERY     LOWER EXTREMITY ANGIOGRAPHY Left 10/19/2023   Procedure: Lower Extremity Angiography;  Surgeon: Jama Cordella MATSU, MD;  Location: [facility] INVASIVE CV LAB;  Service: Cardiovascular;  Laterality: Left;   LOWER EXTREMITY ANGIOGRAPHY Left 12/25/2023   Procedure: Lower Extremity Angiography;  Surgeon: Jama Cordella MATSU, MD;  Location: [facility] INVASIVE CV LAB;  Service: Cardiovascular;  Laterality: Left;   LOWER EXTREMITY INTERVENTION Left 10/19/2023   Procedure: LOWER EXTREMITY INTERVENTION;  Surgeon: Jama Cordella MATSU, MD;  Location: [facility] INVASIVE CV LAB;  Service: Cardiovascular;  Laterality: Left;   TONGUE SURGERY     removal of cancer   TONSILLECTOMY     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/13/2016   Procedure: TOTAL HIP ARTHROPLASTY ANTERIOR APPROACH;  Surgeon: Ozell Flake, MD;  Location: [facility] ORS;  Service: Orthopedics;  Laterality: Right;   TOTAL KNEE ARTHROPLASTY Right 11/23/2015   Procedure: TOTAL KNEE ARTHROPLASTY;  Surgeon: Ozell Flake, MD;  Location: [facility] ORS;  Service: Orthopedics;  Laterality: Right;   VOCAL CORD LATERALIZATION, ENDOSCOPIC APPROACH W/ MLB       Allergies[1]  Family History  Problem Relation Age of Onset   Cancer Father    Heart disease Mother     Prior to Admission medications  Medication Sig Start Date End Date Taking? Authorizing Provider  Accu-Chek Softclix Lancets lancets Use with device to check sugars twice daily 03/02/23   Orlean Alan HERO, FNP  albuterol  (VENTOLIN  HFA) 108 (90 Base) MCG/ACT inhaler INHALE 2 PUFFS INTO THE LUNGS EVERY 6 HOURS AS NEEDED FOR WHEEZING OR SHORTNESS OF BREATH 01/24/24   Orlean Alan HERO, FNP  apixaban  (ELIQUIS ) 5 MG TABS tablet Take 1 tablet (5 mg total) by mouth 2  (two) times daily. 10/20/23   Josette Ade, MD  aspirin  EC 81 MG tablet Take 1 tablet (81 mg total) by mouth daily. Swallow whole. 10/21/23   Josette Ade, MD  betamethasone , augmented, (DIPROLENE ) 0.05 % lotion Apply 1 Application topically 2 (two) times daily. 12/13/22   [provider]  Cholecalciferol  (VITAMIN D3) 125 MCG (5000 UT) CAPS Take 1 capsule (5,000 Units total) by mouth daily. 12/14/22   Orlean Alan HERO, FNP  Continuous Glucose Sensor (DEXCOM G7 SENSOR) MISC Use to monitor blood sugar continuously. Change sensor every 10 days as directed. Dx E11.65 11/27/23   Orlean Alan HERO, FNP  docusate sodium  (COLACE) 100 MG capsule Take 1 capsule (100 mg total) by mouth 2 (two) times daily. 06/16/16   Viviann Pastor, MD  dorzolamide -timolol  (COSOPT ) 2-0.5 % ophthalmic solution Place 1 drop into both eyes 2 (two) times daily. 11/24/22   [provider]  doxepin  (SINEQUAN ) 10 MG capsule TAKE 1 CAPSULE BY MOUTH AT BEDTIME  Patient taking differently: Take 1,020 mg by mouth at bedtime. 09/14/22   Orlean Alan HERO, FNP  DULoxetine  (CYMBALTA ) 60 MG capsule TAKE 1 CAPSULE BY MOUTH DAILY 01/24/24   Orlean Alan HERO, FNP  esomeprazole  (NEXIUM ) 40 MG capsule Take 1 capsule (40 mg total) by mouth daily. 05/07/23   Orlean Alan HERO, FNP  ferrous sulfate  325 (65 FE) MG tablet Take 325 mg by mouth daily with breakfast.    [provider]  fluticasone  (FLONASE ) 50 MCG/ACT nasal spray Place 2 sprays into both nostrils daily as needed for allergies. 05/07/23   Orlean Alan HERO, FNP  furosemide  (LASIX ) 20 MG tablet TAKE 1 TABLET BY MOUTH DAILY 01/24/24   Orlean Alan HERO, FNP  glucose blood (ACCU-CHEK GUIDE TEST) test strip CHECK BLOOD SUGAR TWICE DAILY 03/01/23   Orlean Alan HERO, FNP  insulin  glargine (LANTUS  SOLOSTAR) 100 UNIT/ML Solostar Pen Inject 10 Units into the skin at bedtime. 11/15/23   Orlean Alan HERO, FNP  insulin  lispro (HUMALOG ) 100 UNIT/ML KwikPen Inject 2 Units  into the skin 3 (three) times daily with meals. If eating and Blood Glucose (BG) 80 or higher inject 2 units for meal coverage and add correction dose per scale. If not eating, correction dose only. BG <150= 0 unit; BG 150-200= 1 unit; BG 201-250= 2 unit; BG 251-300= 3 unit; BG 301-350= 4 unit; BG 351-400= 5 unit; BG >400= 6 unit and Call Primary Care. 10/20/23   Josette Ade, MD  Insulin  Pen Needle (BD PEN NEEDLE NANO U/F) 32G X 4 MM MISC 1 each by Does not apply route in the morning, at noon, in the evening, and at bedtime. 05/07/23   Orlean Alan HERO, FNP  lactulose  (CHRONULAC ) 10 GM/15ML solution Take 15 mLs (10 g total) by mouth 2 (two) times daily as needed for mild constipation, moderate constipation or severe constipation. Patient not taking: Reported on 01/15/2024 05/07/23   Orlean Alan HERO, FNP  losartan  (COZAAR ) 50 MG tablet TAKE 1 TABLET BY MOUTH DAILY 12/20/23   Orlean Alan HERO, FNP  metFORMIN  (GLUCOPHAGE ) 500 MG tablet TAKE 1 TABLET BY MOUTH TWICE A DAY 01/07/24   Orlean Alan HERO, FNP  polyethylene glycol (MIRALAX  / GLYCOLAX ) 17 g packet Take 17 g by mouth daily. 10/21/23   Josette Ade, MD  potassium chloride  SA (K-DUR,KLOR-CON ) 20 MEQ tablet Take 20 mEq by mouth daily.    [provider]  pravastatin  (PRAVACHOL ) 40 MG tablet TAKE 1 TABLET BY MOUTH DAILY 05/09/23   Orlean Alan HERO, FNP  pregabalin  (LYRICA ) 150 MG capsule Take 1 capsule (150 mg total) by mouth 2 (two) times daily. 01/15/24   Brown, Fallon E, NP  vitamin B-12 (CYANOCOBALAMIN ) 1000 MCG tablet Take 1,000 mcg by mouth daily.    [provider]    Social History:  reports that she has been smoking cigarettes. She has a 25 pack-year smoking history. She has never used smokeless tobacco. She reports current alcohol use. She reports current drug use. Drug: Marijuana. Lives by herself.  Smokes 1/3 pack/day.  Does not drink alcohol.  Can perform ADLs independently but needs help with  IADLs.   Physical Exam: Vitals:   02/10/24 0841 02/10/24 0843 02/10/24 1054  BP: (!) 154/58  (!) 140/49  Pulse: (!) 58  (!) 54  Resp: 20  13  Temp: 98.2 F (36.8 C)    TempSrc: Oral    SpO2: 100%  100%  Weight:  77.1 kg   Height:  5' 5 (1.651  m)     Gen: NAD HENT: NCAT CV: Sinus rhythm with premature beats.  Good radial pulse Lung: CTAB, decreased movements at bases Abd: No TTP, normal bowel sounds MSK: No asymmetry, TTP over right hip Neuro: alert and oriented x 4   Labs on Admission: I have personally reviewed following labs and imaging studies  CBC: Recent Labs  Lab 02/10/24 1149  WBC 7.2  HGB 9.6*  HCT 30.8*  MCV 87.3  PLT 344   Basic Metabolic Panel: Recent Labs  Lab 02/10/24 1149  NA 138  K 4.1  CL 99  CO2 30  GLUCOSE 168*  BUN 19  CREATININE 1.01*  CALCIUM 9.4   GFR: Estimated Creatinine Clearance: 44.1 mL/min (A) (by C-G formula based on SCr of 1.01 mg/dL (H)). Liver Function Tests: Recent Labs  Lab 02/10/24 1149  AST 31  ALT 13  ALKPHOS 113  BILITOT 0.2  PROT 6.9  ALBUMIN 3.9   No results for input(s): LIPASE, AMYLASE in the last 168 hours. No results for input(s): AMMONIA in the last 168 hours. Coagulation Profile: Recent Labs  Lab 02/10/24 1149  INR 1.1   Cardiac Enzymes: No results for input(s): CKTOTAL, CKMB, CKMBINDEX, TROPONINI, TROPONINIHS in the last 168 hours. BNP (last 3 results) No results for input(s): BNP in the last 8760 hours. HbA1C: No results for input(s): HGBA1C in the last 72 hours. CBG: Recent Labs  Lab 02/10/24 1057  GLUCAP 162*   Lipid Profile: No results for input(s): CHOL, HDL, LDLCALC, TRIG, CHOLHDL, LDLDIRECT in the last 72 hours. Thyroid  Function Tests: No results for input(s): TSH, T4TOTAL, FREET4, T3FREE, THYROIDAB in the last 72 hours. Anemia Panel: No results for input(s): VITAMINB12, FOLATE, FERRITIN, TIBC, IRON , RETICCTPCT in the  last 72 hours. Urine analysis:    Component Value Date/Time   COLORURINE YELLOW (A) 02/10/2024 1315   APPEARANCEUR HAZY (A) 02/10/2024 1315   APPEARANCEUR Clear 08/19/2014 1515   LABSPEC 1.009 02/10/2024 1315   PHURINE 5.0 02/10/2024 1315   GLUCOSEU NEGATIVE 02/10/2024 1315   HGBUR SMALL (A) 02/10/2024 1315   BILIRUBINUR NEGATIVE 02/10/2024 1315   BILIRUBINUR Negative 08/19/2014 1515   KETONESUR NEGATIVE 02/10/2024 1315   PROTEINUR NEGATIVE 02/10/2024 1315   NITRITE NEGATIVE 02/10/2024 1315   LEUKOCYTESUR LARGE (A) 02/10/2024 1315    Radiological Exams on Admission: I have personally reviewed images DG Chest Portable 1 View Result Date: 02/10/2024 CLINICAL DATA:  Altered mental status, fall. EXAM: PORTABLE CHEST 1 VIEW COMPARISON:  10/15/2023 FINDINGS: Lung volumes are low. Patchy opacity in the lung bases, left greater than right. The heart is normal in size with stable mediastinal contours. Possible small left pleural effusion. No pneumothorax. No pulmonary edema. On limited assessment, no acute osseous findings. IMPRESSION: Low lung volumes with patchy bibasilar opacities, left greater than right, atelectasis versus pneumonia. Possible small left pleural effusion. Electronically Signed   By: Andrea Gasman M.D.   On: 02/10/2024 12:05   CT Cervical Spine Wo Contrast Result Date: 02/10/2024 EXAM: CT CERVICAL SPINE WITHOUT CONTRAST 02/10/2024 11:12:00 AM TECHNIQUE: CT of the cervical spine was performed without the administration of intravenous contrast. Multiplanar reformatted images are provided for review. Automated exposure control, iterative reconstruction, and/or weight based adjustment of the mA/kV was utilized to reduce the radiation dose to as low as reasonably achievable. COMPARISON: 10/15/2023 CLINICAL HISTORY: Neck trauma (Age >= 65y). Patient reports multiple falls last night. FINDINGS: BONES AND ALIGNMENT: No acute fracture or traumatic malalignment. Slight degenerative  retrolisthesis at C4-C5,  C5-C6, and C6-C7 is stable. DEGENERATIVE CHANGES: Uncovertebral spurring contributes to moderate left foraminal narrowing at C4-C5 and C6-C7 and moderate bilateral foraminal stenosis at C5-C6. SOFT TISSUES: No prevertebral soft tissue swelling. IMPRESSION: 1. No acute findings. Electronically signed by: Lonni Necessary MD 02/10/2024 11:44 AM EST RP Workstation: [id number]   CT HEAD WO CONTRAST Result Date: 02/10/2024 EXAM: CT HEAD WITHOUT CONTRAST 02/10/2024 11:12:00 AM TECHNIQUE: CT of the head was performed without the administration of intravenous contrast. Automated exposure control, iterative reconstruction, and/or weight based adjustment of the mA/kV was utilized to reduce the radiation dose to as low as reasonably achievable. COMPARISON: 10/15/2023 CLINICAL HISTORY: Facial trauma, blunt. Multiple falls last night. FINDINGS: BRAIN AND VENTRICLES: No acute hemorrhage. No evidence of acute infarct. No hydrocephalus. No extra-axial collection. No mass effect or midline shift. Stable atrophy. Mild periventricular and deep white matter hypodensity typical of chronic small vessel ischemia. ORBITS: Bilateral cataract resection noted. Remote right medial orbital fracture. Right-sided scleral banding is noted. SINUSES: No acute abnormality. SOFT TISSUES AND SKULL: No acute soft tissue abnormality. No skull fracture. Atherosclerosis of skullbase vasculature without hyperdense vessel or abnormal calcification. IMPRESSION: 1. No acute intracranial abnormality. Electronically signed by: Lonni Necessary MD 02/10/2024 11:32 AM EST RP Workstation: [id number]    EKG: My personal interpretation of EKG shows: Sinus rhythm and acute ST changes  Assessment/Plan Principal Problem:   Weakness generalized Active Problems:   Type 2 diabetes mellitus with hyperglycemia (HCC)   Elevated lactic acid level   Mixed hyperlipidemia   Atrial fibrillation (HCC)   Essential hypertension   CKD  stage 3a, GFR 45-59 ml/min (HCC)   Patient with generalized weakness and multiple falls.  Weakness appears to be multifactorial including decreased intake versus deconditioning.  There is concern on imaging for pneumonia but given bilateral low lung volumes I favor this to be atelectasis.  Patient is status post ceftriaxone  and azithromycin  in the ED.  Blood cultures have been obtained.  I will get procalcitonin to guide further antibiotic therapy as my clinical suspicion is lower for this.  Suspect will monitor for development of any leukocytosis or any fevers and monitor culture data.  Will consult PT and OT for her weakness.  Recurrent falls: Patient fall appears to be mechanical in nature.  Will get orthostatic vitals (messaged RN regarding this).  Advised patient to use walker regularly to prevent these falls.  PT OT consulted as above.  Given complaint of right hip pain and fall will get right hip plain radiography.  Hypertension: Patient's home regimen includes losartan  50 mg, Lasix  20 mg.  Patient is hypertensive here.  Will continue home regimen.   Hyperlipidemia: Patient on pravastatin  40 mg.  Will continue home medicine here.   Type 2 diabetes: Patient's home regimen is metformin , Lantus  10 units nightly along with 2 units of lispro 3 times a day.  Will place patient on SSI and if she remains elevated can restart home Lantus .   Atrial fibrillation: Patient with history of A-fib.  In sinus rhythm.  On Eliquis  for anticoagulation.  Will continue.   CKD 3A: At baseline.  Renally dose medications and avoid nephrotoxic agents.   Normocytic anemia: On oral iron  therapy.  Continue here.  Iron  level checked in 09/2023 and was at 11 with 3% saturation consistent with deficiency.  Repeat iron  studies outpatient.   VTE prophylaxis:  Apixaban  Diet: HH/carb Code Status:  Full Code Telemetry:  Admission status: Observation, Telemetry bed Patient is from: Home Anticipated d/c is  to:  Home Anticipated d/c is in: 1-2 days   Family Communication: Updated at bedside  Consults called: None   Severity of Illness: The appropriate patient status for this patient is OBSERVATION. Observation status is judged to be reasonable and necessary in order to provide the required intensity of service to ensure the patient's safety. The patient's presenting symptoms, physical exam findings, and initial radiographic and laboratory data in the context of their medical condition is felt to place them at decreased risk for further clinical deterioration. Furthermore, it is anticipated that the patient will be medically stable for discharge from the hospital within 2 midnights of admission.    Morene Bathe, MD [facility] DEL. [facility]      [1] No Known Allergies  "

## 2024-02-10 NOTE — Hospital Course (Addendum)
 SABRA

## 2024-02-10 NOTE — ED Notes (Addendum)
 Lab unable to get all labs from pt. Pt stuck multiple times by staff and lab.

## 2024-02-10 NOTE — ED Triage Notes (Signed)
 Pt to ED from home [facility] for 4 falls last night. Unsure if mechanical. Increased generalized weakness over last few weeks with legs giving out. States one of her toes is black and had recent surgery for DVT. EMS stroke screen negative.   HR 59-68, 97% RA, CO2 44, 156/61, CBG 154, no temp  Pt unsure how she fell or if she hit her head. Complains of pain to R side of hip and generalized pain and back pain.   L small toe is not necrotic.

## 2024-02-10 NOTE — ED Provider Notes (Signed)
 "  [facility] Provider Note    Event Date/Time   First MD Initiated Contact with Patient 02/10/24 1049     (approximate)   History   Fall and Weakness   HPI  Wanda Gardner is a 83 y.o. female presents with concern of multiple falls at home.  She tells me that she has multiple falls at home regularly, I asked her for further details, but she tells me repeatedly I do not know to most answers.  Unfortunately by the historian, it seems that over the last few days she has been increasingly weak, but unsure if there is been any other focal symptoms without clear source of fever infection or other symptom.  I attempted to reach her daughter and left a message as I was not able to contact her over the phone.  Family member is at bedside, who provides further history informing that generally patient is conversive and independent however now with increased frequency of falls and confusion having trouble recalling events that she normally is able to recall.  She also noticed a development of a interval cough, but no noticeable shortness of breath.     Physical Exam   Triage Vital Signs: ED Triage Vitals  Encounter Vitals Group     BP 02/10/24 0841 (!) 154/58     Girls Systolic BP Percentile --      Girls Diastolic BP Percentile --      Boys Systolic BP Percentile --      Boys Diastolic BP Percentile --      Pulse Rate 02/10/24 0841 (!) 58     Resp 02/10/24 0841 20     Temp 02/10/24 0841 98.2 F (36.8 C)     Temp Source 02/10/24 0841 Oral     SpO2 02/10/24 0841 100 %     Weight 02/10/24 0843 170 lb (77.1 kg)     Height 02/10/24 0843 5' 5 (1.651 m)     Head Circumference --      Peak Flow --      Pain Score 02/10/24 0842 10     Pain Loc --      Pain Education --      Exclude from Growth Chart --     Most recent vital signs: Vitals:   02/10/24 0841 02/10/24 1054  BP: (!) 154/58 (!) 140/49  Pulse: (!) 58 (!) 54  Resp: 20 13  Temp: 98.2 F (36.8 C)    SpO2: 100% 100%     General: Awake, no distress.  CV:  Good peripheral perfusion.  Resp:  Normal effort.  Abd:  No distention.  Soft nontender Neuro:  Patient confused, delayed response to questions with confused verbal response, she is moving extremities to command Other:     ED Results / Procedures / Treatments   Labs (all labs ordered are listed, but only abnormal results are displayed) Labs Reviewed  COMPREHENSIVE METABOLIC PANEL WITH GFR - Abnormal; Notable for the following components:      Result Value   Glucose, Bld 168 (*)    Creatinine, Ser 1.01 (*)    GFR, Estimated 55 (*)    All other components within normal limits  CBC - Abnormal; Notable for the following components:   RBC 3.53 (*)    Hemoglobin 9.6 (*)    HCT 30.8 (*)    All other components within normal limits  URINALYSIS, ROUTINE W REFLEX MICROSCOPIC - Abnormal; Notable for the following components:   Color,  Urine YELLOW (*)    APPearance HAZY (*)    Hgb urine dipstick SMALL (*)    Leukocytes,Ua LARGE (*)    All other components within normal limits  LACTIC ACID, PLASMA - Abnormal; Notable for the following components:   Lactic Acid, Venous 2.4 (*)    All other components within normal limits  CBG MONITORING, ED - Abnormal; Notable for the following components:   Glucose-Capillary 162 (*)    All other components within normal limits  RESP PANEL BY RT-PCR (RSV, FLU A&B, COVID)  RVPGX2  CULTURE, BLOOD (ROUTINE X 2)  CULTURE, BLOOD (ROUTINE X 2)  URINE CULTURE  PROTIME-INR  MAGNESIUM   PROCALCITONIN     EKG  Sinus rhythm with rate of about 60, axis of about 15, intervals appear to be within normal limits, no obvious ischemia appreciated on this EKG   RADIOLOGY Left lower lobe infiltrate likely consistent with pneumonia given the presentation  PROCEDURES:  Critical Care performed: No  Procedures   MEDICATIONS ORDERED IN ED: Medications  azithromycin  (ZITHROMAX ) 500 mg in sodium  chloride 0.9 % 250 mL IVPB (has no administration in time range)  sodium chloride  flush (NS) 0.9 % injection 3 mL (has no administration in time range)  acetaminophen  (TYLENOL ) tablet 650 mg (has no administration in time range)    Or  acetaminophen  (TYLENOL ) suppository 650 mg (has no administration in time range)  senna-docusate (Senokot-S) tablet 1 tablet (has no administration in time range)  ondansetron  (ZOFRAN ) tablet 4 mg (has no administration in time range)    Or  ondansetron  (ZOFRAN ) injection 4 mg (has no administration in time range)  ipratropium-albuterol  (DUONEB) 0.5-2.5 (3) MG/3ML nebulizer solution 3 mL (has no administration in time range)  sodium chloride  0.9 % bolus 1,000 mL (0 mLs Intravenous Stopped 02/10/24 1455)  cefTRIAXone  (ROCEPHIN ) 1 g in sodium chloride  0.9 % 100 mL IVPB (0 g Intravenous Stopped 02/10/24 1353)     IMPRESSION / MDM / ASSESSMENT AND PLAN / ED COURSE  I reviewed the triage vital signs and the nursing notes.                               Patient's presentation is most consistent with acute complicated illness / injury requiring diagnostic workup.  84 year old female who presents today with concern of confusion multiple falls at home and increased weakness.  Lactic of 2.4 here, again no clear source unfortunately, her blood pressure and remaining vitals appear reassuring.  She is unfortunately confused and difficulty responding to questions such as where she lives or where she is.  I am unable to get in touch with her daughter, but it seems that the daughter does not live with the patient.  Will follow-up remaining lab work, and determine safe disposition accordingly unfortunately given her altered mental status and the current picture I anticipate likely will warrant admission.   Clinical Course as of 02/10/24 1537  Sun Feb 10, 2024  1302 X-ray imaging demonstrating evidence of pneumonia, initiating antibiotics, plan for admission for medicine  service at this time.  I did evaluate her bilateral feet, no evidence of necrotic toes as noted in triage note. [SK]  1327 Spoke with Dr. Fernand who has agreed to evaluate the patient to determine course of further medical management at this time. [SK]    Clinical Course User Index [SK] Fernand Rossie HERO, MD     FINAL CLINICAL IMPRESSION(S) / ED DIAGNOSES  Final diagnoses:  Pneumonia of left lower lobe due to infectious organism     Rx / DC Orders   ED Discharge Orders     None        Note:  This document was prepared using Dragon voice recognition software and may include unintentional dictation errors.   Fernand Rossie HERO, MD 02/10/24 1537  "

## 2024-02-11 ENCOUNTER — Observation Stay

## 2024-02-11 DIAGNOSIS — R531 Weakness: Secondary | ICD-10-CM | POA: Diagnosis not present

## 2024-02-11 LAB — BASIC METABOLIC PANEL WITH GFR
Anion gap: 7 (ref 5–15)
BUN: 12 mg/dL (ref 8–23)
CO2: 32 mmol/L (ref 22–32)
Calcium: 8.7 mg/dL — ABNORMAL LOW (ref 8.9–10.3)
Chloride: 103 mmol/L (ref 98–111)
Creatinine, Ser: 0.85 mg/dL (ref 0.44–1.00)
GFR, Estimated: >60 mL/min
Glucose, Bld: 112 mg/dL — ABNORMAL HIGH (ref 70–99)
Potassium: 3.7 mmol/L (ref 3.5–5.1)
Sodium: 142 mmol/L (ref 135–145)

## 2024-02-11 LAB — CBC
HCT: 28.3 % — ABNORMAL LOW (ref 36.0–46.0)
Hemoglobin: 8.8 g/dL — ABNORMAL LOW (ref 12.0–15.0)
MCH: 27.4 pg (ref 26.0–34.0)
MCHC: 31.1 g/dL (ref 30.0–36.0)
MCV: 88.2 fL (ref 80.0–100.0)
Platelets: 337 K/uL (ref 150–400)
RBC: 3.21 MIL/uL — ABNORMAL LOW (ref 3.87–5.11)
RDW: 14 % (ref 11.5–15.5)
WBC: 6 K/uL (ref 4.0–10.5)
nRBC: 0 % (ref 0.0–0.2)

## 2024-02-11 LAB — GLUCOSE, CAPILLARY
Glucose-Capillary: 124 mg/dL — ABNORMAL HIGH (ref 70–99)
Glucose-Capillary: 132 mg/dL — ABNORMAL HIGH (ref 70–99)
Glucose-Capillary: 248 mg/dL — ABNORMAL HIGH (ref 70–99)
Glucose-Capillary: 191 mg/dL — ABNORMAL HIGH (ref 70–99)

## 2024-02-11 MED ORDER — LOSARTAN POTASSIUM 50 MG PO TABS
50.0000 mg | ORAL_TABLET | Freq: Every day | ORAL | Status: DC
  Administered 2024-02-11: 50 mg via ORAL
  Filled 2024-02-11 (×2): qty 1

## 2024-02-11 MED ORDER — ENSURE PLUS HIGH PROTEIN PO LIQD
237.0000 mL | Freq: Two times a day (BID) | ORAL | Status: DC
  Administered 2024-02-11 – 2024-02-13 (×4): 237 mL via ORAL

## 2024-02-11 NOTE — Evaluation (Signed)
 Occupational Therapy Evaluation Patient Details Name: Wanda Gardner MRN: [id number] DOB: 11/16/1940 Today's Date: 02/11/2024   History of Present Illness   Pt is an 83 y/o F admitted on 02/10/24 after presenting with c/o generalized weakness & falls. Imaging favored for atelectasis. PMH: HLD, HTN, PAD, DM2, a-fib, CKD 3A, depression, glaucoma     Clinical Impressions Pt was seen for OT evaluation this date. PTA, pt resides at home with her daughter. 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 Her daughter works during the day, but provides meals and assists with laundry. Pt presents with deficits in strength, balance, pain and safety awareness limiting their ability to perform ADL management at baseline level. Pt currently requires CGA for bed mobility, STS and ambulation including ADL transfer to the bathroom and back and all aspects of toileting during session. Edu on use of RW at this time and in her house to maximize safety and prevent falls. Pt denies dizziness, mild R hip and lower back pain during movement. Able to adjust bil socks via figure four with supervision. Will follow acutely to maximize return to PLOF, anticipate HHOT services upon DC.       If plan is discharge home, recommend the following:   A little help with walking and/or transfers;A little help with bathing/dressing/bathroom;Assistance with cooking/housework;Assist for transportation;Help with stairs or ramp for entrance     Functional Status Assessment   Patient has had a recent decline in their functional status and demonstrates the ability to make significant improvements in function in a reasonable and predictable amount of time.     Equipment Recommendations   Other (comment) (RW)     Recommendations for Other Services         Precautions/Restrictions   Precautions Precautions: Fall Recall of  Precautions/Restrictions: Intact Restrictions Weight Bearing Restrictions Per Provider Order: No     Mobility Bed Mobility Overal bed mobility: Needs Assistance Bed Mobility: Supine to Sit     Supine to sit: Contact guard, HOB elevated, Used rails, Min assist     General bed mobility comments: increased time/effort and HHA from therapist    Transfers Overall transfer level: Needs assistance Equipment used: Rolling walker (2 wheels) Transfers: Sit to/from Stand Sit to Stand: Contact guard assist, Supervision           General transfer comment: stood from lowest bed height and ambulated to the bathroom and back using RW with CGA      Balance Overall balance assessment: Needs assistance Sitting-balance support: Feet supported Sitting balance-Leahy Scale: Good     Standing balance support: During functional activity, No upper extremity supported Standing balance-Leahy Scale: Fair                             ADL either performed or assessed with clinical judgement   ADL Overall ADL's : Needs assistance/impaired     Grooming: Wash/dry hands;Standing;Supervision/safety;Contact guard assist               Lower Body Dressing: Supervision/safety;Sitting/lateral leans;Bed level Lower Body Dressing Details (indicate cue type and reason): via figure four at bed level able to adjust bil socks Toilet Transfer: Supervision/safety;Rolling walker (2 wheels);Contact guard assist   Toileting- Clothing Manipulation and Hygiene: Supervision/safety;Sitting/lateral lean       Functional mobility during ADLs: Rolling walker (2 wheels);Contact guard assist;Supervision/safety  Vision Patient Visual Report: No change from baseline Additional Comments: R eye blind, reports no longer driving at night     Perception         Praxis         Pertinent Vitals/Pain Pain Assessment Pain Assessment: 0-10 Pain Score: 8  Pain Location: R hip pain Pain  Descriptors / Indicators: Sore Pain Intervention(s): Monitored during session, Repositioned     Extremity/Trunk Assessment Upper Extremity Assessment Upper Extremity Assessment: Overall WFL for tasks assessed   Lower Extremity Assessment Lower Extremity Assessment: Overall WFL for tasks assessed;Generalized weakness   Cervical / Trunk Assessment Cervical / Trunk Assessment: Kyphotic   Communication Communication Communication: No apparent difficulties   Cognition Arousal: Alert Behavior During Therapy: WFL for tasks assessed/performed Cognition: No apparent impairments                                       Cueing  General Comments      denied dizziness during session   Exercises Other Exercises Other Exercises: Edu on role of OT in acute setting and DC recommendations.   Shoulder Instructions      Home Living Family/patient expects to be discharged to:: Private residence Living Arrangements: Children (daughter) Available Help at Discharge: Family;Available PRN/intermittently (daughter is not there more than she is there, per pt) Type of Home: House Home Access: Ramped entrance     Home Layout: One level     Bathroom Shower/Tub: Producer, Television/film/video: Standard Bathroom Accessibility: No   Home Equipment: Rollator (4 wheels);BSC/3in1   Additional Comments: BSC placed at bedside at home      Prior Functioning/Environment Prior Level of Function : Independent/Modified Independent;History of Falls (last six months)             Mobility Comments: MOD I/IND household mobilization, furniture walks, several falls over the last 6 months, 6 falls last week leading to hospitalization ADLs Comments: MOD I with ADLs and daughter assists with some meals, laundry, etc. has a large dog she cares for as well    OT Problem List: Decreased strength;Decreased activity tolerance;Impaired balance (sitting and/or standing)   OT  Treatment/Interventions: Self-care/ADL training;Therapeutic exercise;Patient/family education;Balance training;Energy conservation;DME and/or AE instruction;Therapeutic activities      OT Goals(Current goals can be found in the care plan section)   Acute Rehab OT Goals Patient Stated Goal: get better OT Goal Formulation: With patient Time For Goal Achievement: 02/25/24 Potential to Achieve Goals: Good ADL Goals Pt Will Perform Lower Body Dressing: with modified independence;sitting/lateral leans;sit to/from stand Pt Will Transfer to Toilet: with modified independence;ambulating Additional ADL Goal #1: Pt will demo implementation of 1 learned falls prevention strategy during ADL performance and mobility to maximize safety.   OT Frequency:  Min 2X/week    Co-evaluation              AM-PAC OT 6 Clicks Daily Activity     Outcome Measure Help from another person eating meals?: None Help from another person taking care of personal grooming?: None Help from another person toileting, which includes using toliet, bedpan, or urinal?: A Little Help from another person bathing (including washing, rinsing, drying)?: A Little Help from another person to put on and taking off regular upper body clothing?: None Help from another person to put on and taking off regular lower body clothing?: A Little 6 Click Score: 21   End  of Session Equipment Utilized During Treatment: Rolling walker (2 wheels) Nurse Communication: Mobility status  Activity Tolerance: Patient tolerated treatment well Patient left: in chair;with call bell/phone within reach;with chair alarm set  OT Visit Diagnosis: Other abnormalities of gait and mobility (R26.89);Muscle weakness (generalized) (M62.81);Repeated falls (R29.6)                Time: 9185-9154 OT Time Calculation (min): 31 min Charges:  OT General Charges $OT Visit: 1 Visit OT Evaluation $OT Eval Moderate Complexity: 1 Mod OT Treatments $Self Care/Home  Management : 8-22 mins  Marland Reine Chrismon, OTR/L 02/11/2024, 10:50 AM   Aymen Widrig E Chrismon 02/11/2024, 10:47 AM

## 2024-02-11 NOTE — Progress Notes (Signed)
 " Progress Note   Patient: Wanda Gardner FMW:[id number] DOB: 12/22/1940 DOA: 02/10/2024     0 DOS: the patient was seen and examined on 02/11/2024   Brief hospital course: Wanda Gardner is a 83 y.o. year old female with medical history of hypertension, hyperlipidemia complicated by PAD, type 2 diabetes, atrial fibrillation, CKD 3A presenting to the ED with generalized weakness and multiple falls.   Patient reports feeling weak.  States she had multiple falls yesterday and reports frequent falls.  She is unable to give significant details into her falls but states 1 time she slipped from her bed and other times it feels like her legs gave out.  She denies any dizziness or lightheadedness.  States she has a walker but does not use it consistently.  She reports coughing with no sputum production.  She denies any fevers or chills.  She is unable to give me the duration of her cough.  She denies any dysuria.  She reports right hip pain that has been present since her fall.   Patient states she lives by herself but sister is at bedside who states her daughter helps her when she is able to and from her knowledge she resides with her.     On arrival to the ED patient was noted to be HDS stable.  Lab work and imaging performed.  CBC without leukocytosis, mild anemia that is at baseline.  CMP with mild hyperglycemia renal function at baseline and no abnormalities otherwise.  Lactic acid elevated at 2.4 with repeat pending.  UA ordered and pending.  Blood cultures obtained.  Respiratory panel negative for COVID, RSV, flu.  Chest x-ray obtained that showed bibasilar opacities concerning for pneumonia versus atelectasis.  Given her presentation, [facility] contacted for admission.    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  Rt Hip pain  Weakness appears to be multifactorial and likely related to decreased intake versus deconditioning.   There is concern on imaging for pneumonia but given bilateral low lung volumes I favor this to be atelectasis.   Patient is status post ceftriaxone  and azithromycin  in the ED.   Blood cultures have been obtained.   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 Appreciate PT input and they recommend home health PT upon discharge    Hypertension:  Continue Losartan       Type 2 diabetes with complications of stage IIIa chronic kidney disease Continue Lantus  10 units nightly along with 2 units of lispro 3 times a day. Continue consistent carbohydrate diet   Monitor renal function closely     Paroxysmal Atrial fibrillation:  Patient is currently in sinus rhythm.   Continue Eliquis  as primary prophylaxis for an acute stroke         Anemia of chronic disease H&H is stable       Subjective: No new complaints.  Eating lunch  Physical Exam: Vitals:   02/11/24 0005 02/11/24 0410 02/11/24 0500 02/11/24 0748  BP: (!) 116/48 (!) 105/42  92/65  Pulse: 73  74  72  Resp: 18 18  15   Temp: 98.6 F (37 C) 98.6 F (37 C)  98.5 F (36.9 C)  TempSrc: Oral Oral  Oral  SpO2: 100%   100%  Weight:   90.4 kg   Height:       Gen: Appears comfortable and in no distress, obese HENT: [place]/AT, pale conjunctiva CV: Sinus rhythm with premature beats.  Good radial pulse Lung: CTAB, decreased movements at bases Abd: No TTP, normal bowel sounds MSK: No asymmetry, TTP over right hip Neuro: alert and oriented x 4      Data Reviewed: Labs reviewed.  Hemoglobin 8.8 Labs reviewed  Family Communication: Plan of  care discussed with patient in detail.  She verbalizes understanding and agrees to the plan  Disposition: Status is: Observation The patient remains OBS appropriate and will d/c before 2 midnights.  Planned Discharge Destination: Home with Home Health    Time spent: 40 minutes  Author: Aimee Somerset, MD 02/11/2024 1:25 PM  For on call review [email or web address].  "

## 2024-02-11 NOTE — Plan of Care (Signed)

## 2024-02-11 NOTE — Care Management Obs Status (Signed)
 MEDICARE OBSERVATION STATUS NOTIFICATION   Patient Details  Name: Wanda Gardner MRN: [id number] Date of Birth: 03-Sep-1940   Medicare Observation Status Notification Given:  Yes    Keonta Monceaux W, CMA 02/11/2024, 2:08 PM

## 2024-02-11 NOTE — TOC Initial Note (Addendum)
 Transition of Care [facility]) - Initial/Assessment Note    Patient Details  Name: Wanda Gardner MRN: [id number] Date of Birth: 03/05/1940  Transition of Care [facility]) CM/SW Contact:    Daved JONETTA Hamilton, RN Phone Number: 02/11/2024, 3:05 PM  Clinical Narrative:                  Met with patient, introduced self and explained role. Patient verbalized prior to this hospital encounter she is moderately independent at home. Her daughter helps out with some ADL's such as food and house work. Her sister Ricka Dux helps out with transportation needs and will be transporting the patient home once she is medically ready for discharge. Discussed with patient therapy recommendations for [facility] PT/OT and patient verbalized agreement. Patient is current with [facility] [place] and would like to stay with them.   This CM notified Larraine with [facility] of patient in observation status and request to resume HH with them once medically ready for discharge.   This CM also spoke with Heron at [facility] per patient's request. This CM provided Heron the patient's new phone number and the fax number for the patient's PCP.  Heron advised she will reach out to the patient on the new number.   Expected Discharge Plan: Home w Home Health Services Barriers to Discharge: Continued Medical Work up   Patient Goals and CMS Choice     Choice offered to / list presented to : Patient      Expected Discharge Plan and Services   Discharge Planning Services: CM Consult   Living arrangements for the past 2 months: Single Family Home                                      Prior Living Arrangements/Services Living arrangements for the past 2 months: Single Family Home Lives with:: Self Patient language and need for interpreter reviewed:: Yes Do you feel safe going back to the place where you live?: Yes      Need for Family Participation in Patient Care: Yes (Comment) Care giver  support system in place?: Yes (comment)   Criminal Activity/Legal Involvement Pertinent to Current Situation/Hospitalization: No - Comment as needed  Activities of Daily Living      Permission Sought/Granted   Permission granted to share information with : Yes, Verbal Permission Granted  Share Information with NAME: Ricka Dux     Permission granted to share info w Relationship: sister  Permission granted to share info w Contact Information: [phone]  Emotional Assessment Appearance:: Appears stated age Attitude/Demeanor/Rapport: Engaged Affect (typically observed): Appropriate Orientation: : Oriented to Self, Oriented to Place, Oriented to  Time, Oriented to Situation Alcohol / Substance Use: Not Applicable Psych Involvement: No (comment)  Admission diagnosis:  Weakness generalized [R53.1] Pneumonia of left lower lobe due to infectious organism [J18.9] Patient Active Problem List   Diagnosis Date Noted   Weakness generalized 02/10/2024   Atherosclerosis of artery of extremity with rest pain (HCC) 12/26/2023   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 coli UTI 10/20/2023   Ischemic pain of left foot 10/18/2023   Atherosclerosis of native artery of left lower extremity with rest pain (HCC) 10/17/2023   Left foot pain 10/17/2023   Acute blood loss anemia 10/17/2023   Conjunctivitis, right eye 10/17/2023   Hypokalemia 10/17/2023   CKD stage 3a, GFR  45-59 ml/min (HCC) 10/17/2023   Hypoglycemia 10/15/2023   Dyslipidemia 10/15/2023   Atrial fibrillation (HCC) 10/15/2023   Essential hypertension 10/15/2023   Essential hypertension, benign 12/26/2022   Mixed hyperlipidemia 12/26/2022   Vitamin D  deficiency, unspecified 12/26/2022   B12 deficiency due to diet 12/26/2022   Urinary incontinence without sensory awareness 12/26/2022   Overactive bladder 12/26/2022   Stroke-like symptom 06/10/2022   Pyuria 06/10/2022   Elevated lactic acid  level 06/10/2022   Bicipital tenosynovitis 05/12/2022   Acquired trigger finger of right ring finger 01/10/2022   Carpal tunnel syndrome of right wrist 08/14/2019   Tobacco abuse 12/26/2018   Atrial flutter (HCC) 12/17/2018   Primary osteoarthritis of right hip 06/13/2016   Osteoarthritis of knee 11/23/2015   Cervical radiculopathy 05/26/2015   Impingement syndrome of shoulder region 01/08/2015   Female stress incontinence 08/19/2014   Acute subendocardial infarction, subsequent episode of care [facility]) 10/30/2013   Central retinal vein occlusion (HCC) 03/31/2011   Tear film insufficiency 03/31/2011   Low back pain 11/03/2010   Glaucoma associated with vascular disorder of eye 05/23/2010   Chronic glaucoma 03/22/2010   Senile nuclear sclerosis 03/22/2010   Primary open-angle glaucoma 03/22/2010   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 [place] [address] Phone: [phone] Fax: [phone]  [facility] [address]), [place] - [address] [place]) [place] [address] Phone: [phone] Fax: [phone]     Social Drivers of Health (SDOH) Social History: SDOH Screenings   Food Insecurity: No Food Insecurity (02/10/2024)  Recent Concern: Food Insecurity - Food Insecurity Present (12/25/2023)  Housing: Unknown (02/10/2024)  Transportation Needs: No Transportation Needs (02/10/2024)  Utilities: At Risk (02/10/2024)  Depression (PHQ2-9): High Risk (05/07/2023)  Financial Resource Strain: High Risk (10/29/2023)  Social Connections: Moderately Isolated (02/10/2024)  Tobacco Use: High Risk (02/10/2024)   SDOH Interventions:      Readmission Risk Interventions     No data to display

## 2024-02-11 NOTE — Progress Notes (Signed)
 Dr Lanetta notified of no IV access; instructed ok to leave all IV's out.

## 2024-02-11 NOTE — Evaluation (Signed)
 Physical Therapy Evaluation Patient Details Name: Wanda Gardner MRN: [id number] DOB: 1940-05-08 Today's Date: 02/11/2024  History of Present Illness  Pt is an 83 y/o F admitted on 02/10/24 after presenting with c/o generalized weakness & falls. Imaging favored for atelectasis. PMH: HLD, HTN, PAD, DM2, a-fib, CKD 3A, depression, glaucoma  Clinical Impression  Pt seen for PT evaluation with pt agreeable to tx. Pt reports prior to admission she was ambulatory via furniture walking or not using AD in the home, used motorized cart outside of the home. Pt reports many falls but unsure why. On this date, pt is able to complete bed mobility with mod I, transfers with CGA<>supervision, c/o feeling swimmy headed with prolonged standing & requested to sit. Will continue to follow pt acutely to progress mobility as able. Encouraged use of AD at d/c to increase balance to prevent falls but pt reporting limited ability to use it 2/2 small space in home.  BP in LUE:  Supine: 111/77 mmHg 84 HR 80 bpm Sitting: 140/55 mmHg MAP 80, HR 82 bpm Standing at 0: 116/49 mmHg MAP 68, HR 91 bpm Rechecked in standing: 105/78 mmHg MAP 82, HR 100 bpm Sitting in recliner in RUE: 136/59 mmHg MAP 80, HR 80 bpm      If plan is discharge home, recommend the following: Assistance with cooking/housework;Assist for transportation;Help with stairs or ramp for entrance   Can travel by private vehicle        Equipment Recommendations Rolling walker (2 wheels)  Recommendations for Other Services       Functional Status Assessment Patient has had a recent decline in their functional status and demonstrates the ability to make significant improvements in function in a reasonable and predictable amount of time.     Precautions / Restrictions Precautions Precautions: Fall Restrictions Weight Bearing Restrictions Per Provider Order: No      Mobility  Bed Mobility Overal bed mobility: Modified Independent Bed  Mobility: Sidelying to Sit   Sidelying to sit: Modified independent (Device/Increase time), Used rails, HOB elevated (exit R side of bed)            Transfers Overall transfer level: Needs assistance Equipment used: None Transfers: Sit to/from Stand, Bed to chair/wheelchair/BSC Sit to Stand: Supervision   Step pivot transfers: Contact guard assist (with walker)       General transfer comment: sit>stand from EOB    Ambulation/Gait                  Stairs            Wheelchair Mobility     Tilt Bed    Modified Rankin (Stroke Patients Only)       Balance Overall balance assessment: Needs assistance, History of Falls Sitting-balance support: Feet supported Sitting balance-Leahy Scale: Good     Standing balance support: During functional activity, Bilateral upper extremity supported Standing balance-Leahy Scale: Fair                               Pertinent Vitals/Pain Pain Assessment Pain Assessment: Faces Faces Pain Scale: Hurts little more Pain Location: chronic, generalized Pain Descriptors / Indicators: Discomfort, Aching Pain Intervention(s): Monitored during session    Home Living Family/patient expects to be discharged to:: Private residence Living Arrangements: Children (daughter) Available Help at Discharge: Family;Available PRN/intermittently (daughter is not there more than she is there, per pt) Type of Home: House Home Access: Ramped entrance  Home Layout: One level Home Equipment: Rollator (4 wheels);BSC/3in1 Additional Comments: BSC placed at bedside at home    Prior Function Prior Level of Function : Independent/Modified Independent;History of Falls (last six months)             Mobility Comments: MOD I/IND household mobilization, furniture walks, several falls over the last 6 months, 6 falls last week leading to hospitalization ADLs Comments: MOD I with ADLs and daughter assists with some meals,  laundry, etc. has a large dog she cares for as well     Extremity/Trunk Assessment   Upper Extremity Assessment Upper Extremity Assessment: Overall WFL for tasks assessed    Lower Extremity Assessment Lower Extremity Assessment: Overall WFL for tasks assessed;Generalized weakness    Cervical / Trunk Assessment Cervical / Trunk Assessment: Kyphotic  Communication   Communication Communication: No apparent difficulties    Cognition Arousal: Alert Behavior During Therapy: WFL for tasks assessed/performed   PT - Cognitive impairments: No apparent impairments                         Following commands: Intact       Cueing Cueing Techniques: Verbal cues     General Comments General comments (skin integrity, edema, etc.): denied dizziness during session    Exercises     Assessment/Plan    PT Assessment Patient needs continued PT services  PT Problem List Decreased strength;Decreased balance;Decreased mobility;Decreased knowledge of use of DME;Decreased activity tolerance       PT Treatment Interventions DME instruction;Therapeutic exercise;Gait training;Balance training;Neuromuscular re-education;Functional mobility training;Therapeutic activities;Patient/family education    PT Goals (Current goals can be found in the Care Plan section)  Acute Rehab PT Goals Patient Stated Goal: none stated PT Goal Formulation: With patient Time For Goal Achievement: 02/25/24 Potential to Achieve Goals: Good    Frequency Min 2X/week     Co-evaluation               AM-PAC PT 6 Clicks Mobility  Outcome Measure Help needed turning from your back to your side while in a flat bed without using bedrails?: None Help needed moving from lying on your back to sitting on the side of a flat bed without using bedrails?: None Help needed moving to and from a bed to a chair (including a wheelchair)?: A Little Help needed standing up from a chair using your arms (e.g.,  wheelchair or bedside chair)?: A Little Help needed to walk in hospital room?: A Little Help needed climbing 3-5 steps with a railing? : A Little 6 Click Score: 20    End of Session   Activity Tolerance: Patient limited by fatigue Patient left: in chair;with chair alarm set;with call bell/phone within reach Nurse Communication: Mobility status PT Visit Diagnosis: History of falling (Z91.81);Other abnormalities of gait and mobility (R26.89);Muscle weakness (generalized) (M62.81)    Time: 8994-8980 PT Time Calculation (min) (ACUTE ONLY): 14 min   Charges:   PT Evaluation $PT Eval Low Complexity: 1 Low   PT General Charges $$ ACUTE PT VISIT: 1 Visit         Richerd Pinal, PT, DPT 02/11/2024, 10:54 AM   Richerd CHRISTELLA Pinal 02/11/2024, 10:50 AM

## 2024-02-12 DIAGNOSIS — R531 Weakness: Secondary | ICD-10-CM | POA: Diagnosis not present

## 2024-02-12 LAB — GLUCOSE, CAPILLARY
Glucose-Capillary: 164 mg/dL — ABNORMAL HIGH (ref 70–99)
Glucose-Capillary: 195 mg/dL — ABNORMAL HIGH (ref 70–99)
Glucose-Capillary: 178 mg/dL — ABNORMAL HIGH (ref 70–99)
Glucose-Capillary: 165 mg/dL — ABNORMAL HIGH (ref 70–99)

## 2024-02-12 LAB — URINE CULTURE: Culture: NO GROWTH

## 2024-02-12 MED ORDER — SODIUM CHLORIDE 0.9 % IV SOLN
500.0000 mg | INTRAVENOUS | Status: DC
  Administered 2024-02-12: 500 mg via INTRAVENOUS
  Filled 2024-02-12 (×2): qty 5

## 2024-02-12 MED ORDER — SODIUM CHLORIDE 0.9 % IV SOLN
2.0000 g | INTRAVENOUS | Status: DC
  Administered 2024-02-12: 2 g via INTRAVENOUS
  Filled 2024-02-12 (×2): qty 20

## 2024-02-12 MED ORDER — DORZOLAMIDE HCL-TIMOLOL MAL 2-0.5 % OP SOLN
1.0000 [drp] | Freq: Two times a day (BID) | OPHTHALMIC | Status: DC
  Administered 2024-02-12 – 2024-02-13 (×2): 1 [drp] via OPHTHALMIC
  Filled 2024-02-12: qty 10

## 2024-02-12 MED ORDER — SODIUM CHLORIDE 0.9 % IV BOLUS
500.0000 mL | Freq: Once | INTRAVENOUS | Status: AC
  Administered 2024-02-12: 500 mL via INTRAVENOUS

## 2024-02-12 NOTE — Plan of Care (Signed)

## 2024-02-12 NOTE — Progress Notes (Signed)
 " Progress Note   Patient: Wanda Gardner FMW:[id number] DOB: [birth year] DOA: 02/10/2024     0 DOS: the patient was seen and examined on 02/12/2024   Brief hospital course:  SAMON DISHNER is a 83 y.o. year old female with medical history of hypertension, hyperlipidemia complicated by PAD, type 2 diabetes, atrial fibrillation, CKD 3A presenting to the ED with generalized weakness and multiple falls.   Patient reports feeling weak.  States she had multiple falls yesterday and reports frequent falls.  She is unable to give significant details into her falls but states 1 time she slipped from her bed and other times it feels like her legs gave out.  She denies any dizziness or lightheadedness.  States she has a walker but does not use it consistently.  She reports coughing with no sputum production.  She denies any fevers or chills.  She is unable to give me the duration of her cough.  She denies any dysuria.  She reports right hip pain that has been present since her fall.   Patient states she lives by herself but sister is at bedside who states her daughter helps her when she is able to and from her knowledge she resides with her.     On arrival to the ED patient was noted to be HDS stable.  Lab work and imaging performed.  CBC without leukocytosis, mild anemia that is at baseline.  CMP with mild hyperglycemia renal function at baseline and no abnormalities otherwise.  Lactic acid elevated at 2.4 with repeat pending.  UA ordered and pending.  Blood cultures obtained.  Respiratory panel negative for COVID, RSV, flu.  Chest x-ray obtained that showed bibasilar opacities concerning for pneumonia versus atelectasis.  Given her presentation, TR[facility] contacted for admission.   Assessment and Plan:  Principal Problem:   Weakness generalized Active Problems:   Type 2 diabetes mellitus with hyperglycemia (HCC)   Elevated lactic acid level   Mixed hyperlipidemia   Atrial fibrillation (HCC)   Essential  hypertension   CKD stage 3a, GFR 45-59 ml/min (HCC)     Generalized weakness and multiple falls.  Rt Hip pain  Weakness appears to be multifactorial and likely related to decreased intake versus deconditioning.   Imaging of the hip was done for rt hip pain following the fall and shows right hip arthroplasty without complication or acute abnormality. Appreciate PT input and they recommend home health PT upon discharge     Pneumonia There was concern on imaging for pneumonia but given bilateral low lung volumes, thought to be atelectasis Patient received ceftriaxone  and azithromycin  in the ED prior to admission CT scan of the chest without contrast showed subpleural wedge-shaped consolidation in the lingula measuring 2.1 x 1.5 cm with associated surrounding ground glass.  Blood cultures showed no growth in 2 days Will place patient on IV Rocephin  and Zithromax      Hypertension:  Hold Losartan  for now due to relative hypotension       Type 2 diabetes with complications of stage IIIa chronic kidney disease Continue Lantus  10 units nightly along with 2 units of lispro 3 times a day. Continue consistent carbohydrate diet   Monitor renal function closely      Paroxysmal Atrial fibrillation:  Patient is currently in sinus rhythm.   Continue Eliquis  as primary prophylaxis for an acute stroke         Anemia of chronic disease H&H is stable  Subjective: Complains of feeling very weak noted to have relative hypotension  Physical Exam: Vitals:   02/12/24 0500 02/12/24 0807 02/12/24 0943 02/12/24 1208  BP:  (!) 102/46 (!) 119/47 (!) 120/43  Pulse:  67  71  Resp:  16  16  Temp:  98.1 F (36.7 C)  (!) 97 F (36.1 C)  TempSrc:    Oral  SpO2:  94%  100%  Weight: 90.3 kg     Height:       Gen: Appears comfortable and in no distress, obese HENT: [place]/AT, pale conjunctiva CV: Sinus rhythm with premature beats.  Good radial pulse Lung: CTAB, decreased movements at  bases Abd: No TTP, normal bowel sounds MSK: No asymmetry, TTP over right hip Neuro: alert and oriented x 4   Data Reviewed:  There are no new results to review at this time.  Family Communication: Plan of care discussed with patient at the bedside.  She verbalizes understanding and agrees with the plan.  Called patient's daughter per her request, no response.  Voicemail left  Disposition: Status is: Observation The patient remains OBS appropriate and will d/c before 2 midnights.  Planned Discharge Destination: Home with Home Health    Time spent: 40 minutes  Author: Aimee Somerset, MD 02/12/2024 3:13 PM  For on call review [email or web address].  "

## 2024-02-12 NOTE — Progress Notes (Signed)
 Occupational Therapy Treatment Patient Details Name: Wanda Gardner MRN: [id number] DOB: 1941/02/04 Today's Date: 02/12/2024   History of present illness Pt is an 83 y/o F admitted on 02/10/24 after presenting with c/o generalized weakness & falls. Imaging favored for atelectasis. PMH: HLD, HTN, PAD, DM2, a-fib, CKD 3A, depression, glaucoma   OT comments  Pt is seated in recliner on arrival. Pleasant and agreeable to OT session. She reports mild bil foot tenderness. Pt performed STS, ambulation ~120 ft using RW and ADLs (toileting and LB dressing) during session at [facility] to supervision level with increased time. Will continue to follow acutely to maximize strength and function. Pt returned to bed with all needs in place and will cont to require skilled acute OT services to maximize her safety and IND to return to PLOF.       If plan is discharge home, recommend the following:  A little help with walking and/or transfers;A little help with bathing/dressing/bathroom;Assistance with cooking/housework;Assist for transportation;Help with stairs or ramp for entrance   Equipment Recommendations  Other (comment) (RW)    Recommendations for Other Services      Precautions / Restrictions Precautions Precautions: Fall Recall of Precautions/Restrictions: Intact Restrictions Weight Bearing Restrictions Per Provider Order: No       Mobility Bed Mobility Overal bed mobility: Modified Independent Bed Mobility: Sit to Supine           General bed mobility comments: increased time/effort    Transfers Overall transfer level: Needs assistance Equipment used: Rolling walker (2 wheels) Transfers: Sit to/from Stand Sit to Stand: Supervision           General transfer comment: ambulated ~120 ft using RW with CGA progressing to SBA     Balance Overall balance assessment: Needs assistance, History of Falls Sitting-balance support: Feet supported Sitting balance-Leahy Scale: Good      Standing balance support: During functional activity, Bilateral upper extremity supported Standing balance-Leahy Scale: Fair Standing balance comment: RW                           ADL either performed or assessed with clinical judgement   ADL Overall ADL's : Needs assistance/impaired     Grooming: Wash/dry hands;Standing;Supervision/safety;Contact guard assist               Lower Body Dressing: Supervision/safety;Sitting/lateral leans;Sit to/from stand Lower Body Dressing Details (indicate cue type and reason): via figure four to doff/donn socks and underwear Toilet Transfer: Supervision/safety;Rolling walker (2 wheels);Contact guard assist;Regular Social Worker and Hygiene: Supervision/safety;Sitting/lateral lean Toileting - Clothing Manipulation Details (indicate cue type and reason): after cont urine on toilet     Functional mobility during ADLs: Rolling walker (2 wheels);Contact guard assist;Supervision/safety      Extremity/Trunk Assessment              Vision       Perception     Praxis     Communication Communication Communication: No apparent difficulties   Cognition Arousal: Alert Behavior During Therapy: WFL for tasks assessed/performed                                 Following commands: Intact        Cueing   Cueing Techniques: Verbal cues  Exercises      Shoulder Instructions       General Comments no reports of dizziness throughout  session    Pertinent Vitals/ Pain       Pain Assessment Pain Assessment: Faces Faces Pain Scale: Hurts a little bit Pain Location: bil feet Pain Descriptors / Indicators: Tender Pain Intervention(s): Monitored during session  Home Living                                          Prior Functioning/Environment              Frequency  Min 2X/week        Progress Toward Goals  OT Goals(current goals can now be found in  the care plan section)  Progress towards OT goals: Progressing toward goals  Acute Rehab OT Goals Patient Stated Goal: get better OT Goal Formulation: With patient Time For Goal Achievement: 02/25/24 Potential to Achieve Goals: Good  Plan      Co-evaluation                 AM-PAC OT 6 Clicks Daily Activity     Outcome Measure   Help from another person eating meals?: None Help from another person taking care of personal grooming?: None Help from another person toileting, which includes using toliet, bedpan, or urinal?: A Little Help from another person bathing (including washing, rinsing, drying)?: A Little Help from another person to put on and taking off regular upper body clothing?: None Help from another person to put on and taking off regular lower body clothing?: A Little 6 Click Score: 21    End of Session Equipment Utilized During Treatment: Rolling walker (2 wheels)  OT Visit Diagnosis: Other abnormalities of gait and mobility (R26.89);Muscle weakness (generalized) (M62.81);Repeated falls (R29.6)   Activity Tolerance Patient tolerated treatment well   Patient Left in bed;with call bell/phone within reach;with bed alarm set   Nurse Communication Mobility status        Time: 8572-8555 OT Time Calculation (min): 17 min  Charges: OT General Charges $OT Visit: 1 Visit OT Treatments $Self Care/Home Management : 8-22 mins  Dereka Lueras Chrismon, OTR/L  02/12/2024, 3:59 PM   Shadie Sweatman E Chrismon 02/12/2024, 3:57 PM

## 2024-02-12 NOTE — Plan of Care (Signed)
  Problem: Education: Goal: Knowledge of General Education information will improve Description: Including pain rating scale, medication(s)/side effects and non-pharmacologic comfort measures Outcome: Progressing   Problem: Clinical Measurements: Goal: Ability to maintain clinical measurements within normal limits will improve Outcome: Progressing Goal: Respiratory complications will improve Outcome: Progressing   Problem: Nutrition: Goal: Adequate nutrition will be maintained Outcome: Progressing   Problem: Coping: Goal: Level of anxiety will decrease Outcome: Progressing   Problem: Pain Managment: Goal: General experience of comfort will improve and/or be controlled Outcome: Progressing   Problem: Safety: Goal: Ability to remain free from injury will improve Outcome: Progressing

## 2024-02-13 ENCOUNTER — Other Ambulatory Visit: Payer: Self-pay

## 2024-02-13 DIAGNOSIS — J189 Pneumonia, unspecified organism: Secondary | ICD-10-CM | POA: Diagnosis present

## 2024-02-13 DIAGNOSIS — M25551 Pain in right hip: Secondary | ICD-10-CM | POA: Diagnosis present

## 2024-02-13 DIAGNOSIS — R531 Weakness: Secondary | ICD-10-CM | POA: Diagnosis not present

## 2024-02-13 LAB — CBC
HCT: 28.2 % — ABNORMAL LOW (ref 36.0–46.0)
Hemoglobin: 9 g/dL — ABNORMAL LOW (ref 12.0–15.0)
MCH: 27.1 pg (ref 26.0–34.0)
MCHC: 31.9 g/dL (ref 30.0–36.0)
MCV: 84.9 fL (ref 80.0–100.0)
Platelets: 324 K/uL (ref 150–400)
RBC: 3.32 MIL/uL — ABNORMAL LOW (ref 3.87–5.11)
RDW: 14.2 % (ref 11.5–15.5)
WBC: 5 K/uL (ref 4.0–10.5)
nRBC: 0 % (ref 0.0–0.2)

## 2024-02-13 LAB — BASIC METABOLIC PANEL WITH GFR
Anion gap: 7 (ref 5–15)
BUN: 13 mg/dL (ref 8–23)
CO2: 28 mmol/L (ref 22–32)
Calcium: 8.7 mg/dL — ABNORMAL LOW (ref 8.9–10.3)
Chloride: 107 mmol/L (ref 98–111)
Creatinine, Ser: 0.86 mg/dL (ref 0.44–1.00)
GFR, Estimated: >60 mL/min
Glucose, Bld: 127 mg/dL — ABNORMAL HIGH (ref 70–99)
Potassium: 3.8 mmol/L (ref 3.5–5.1)
Sodium: 142 mmol/L (ref 135–145)

## 2024-02-13 LAB — GLUCOSE, CAPILLARY
Glucose-Capillary: 131 mg/dL — ABNORMAL HIGH (ref 70–99)
Glucose-Capillary: 252 mg/dL — ABNORMAL HIGH (ref 70–99)

## 2024-02-13 MED ORDER — DOXYCYCLINE HYCLATE 100 MG PO TABS
100.0000 mg | ORAL_TABLET | Freq: Two times a day (BID) | ORAL | 0 refills | Status: AC
  Filled 2024-02-13: qty 10, 5d supply, fill #0

## 2024-02-13 NOTE — Discharge Summary (Addendum)
 " Physician Discharge Summary   Patient: Wanda Gardner MRN: [id number] DOB: 06-Jul-1940  Admit date:     02/10/2024  Discharge date: 02/13/2024  Discharge Physician: Yui Mulvaney   PCP: Orlean Alan HERO, FNP   Recommendations at discharge:   Complete antibiotic therapy as recommended Keep scheduled follow-up appointment with PCP Hold antihypertensive medications until resumed by your PCP  Discharge Diagnoses: Principal Problem:   Weakness generalized Active Problems:   CAP (community acquired pneumonia)   Type 2 diabetes mellitus with hyperglycemia (HCC)   Elevated lactic acid level   Mixed hyperlipidemia   Atrial fibrillation (HCC)   Essential hypertension   CKD stage 3a, GFR 45-59 ml/min (HCC)   Hip pain, acute, right  Resolved Problems:   * No resolved hospital problems. *  Hospital Course:  Wanda Gardner is an 83 y.o. year old female with medical history of hypertension, hyperlipidemia complicated by PAD, type 2 diabetes, atrial fibrillation, CKD 3A presenting to the ED with generalized weakness and multiple falls.   Patient reports feeling weak.  States she had multiple falls yesterday and reports frequent falls.  She is unable to give significant details into her falls but states 1 time she slipped from her bed and other times it feels like her legs gave out.  She denies any dizziness or lightheadedness.  States she has a walker but does not use it consistently.  She reports coughing with no sputum production.  She denies any fevers or chills.  She is unable to give me the duration of her cough.  She denies any dysuria.  She reports right hip pain that has been present since her fall.   Patient states she lives by herself but sister is at bedside who states her daughter helps her when she is able to and from her knowledge she resides with her.     On arrival to the ED patient was noted to be HDS stable.  Lab work and imaging performed.  CBC without leukocytosis, mild  anemia that is at baseline.  CMP with mild hyperglycemia renal function at baseline and no abnormalities otherwise.  Lactic acid elevated at 2.4 with repeat pending.  UA ordered and pending.  Blood cultures obtained.  Respiratory panel negative for COVID, RSV, flu.  Chest x-ray obtained that showed bibasilar opacities concerning for pneumonia versus atelectasis.  Given her presentation, [facility] contacted for admission.   Assessment and Plan:   Generalized weakness and multiple falls.  Rt Hip pain  Weakness appears to be multifactorial and likely related to decreased intake versus deconditioning.   Imaging of the hip was done for rt hip pain following the fall and shows right hip arthroplasty without complication or acute abnormality. Appreciate PT input and they recommend home health PT upon discharge      Pneumonia There was concern on imaging for pneumonia but given bilateral low lung volumes, thought to be atelectasis Patient received ceftriaxone  and azithromycin  in the ED prior to admission CT scan of the chest without contrast showed subpleural wedge-shaped consolidation in the lingula measuring 2.1 x 1.5 cm with associated surrounding ground glass.  Blood cultures showed no growth in 2 days Will discharge patient home on doxycycline  100 mg p.o. twice daily       Hypertension:  Hold Losartan  and chlorthalidone  for now due to relative hypotension       Type 2 diabetes with complications of stage IIIa chronic kidney disease Continue Lantus  10 units nightly along with sliding scale insulin   Continue consistent carbohydrate diet   Check blood sugars daily       Paroxysmal Atrial fibrillation:  Patient is currently in sinus rhythm.   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        Consultants: None Procedures performed: None  Disposition: Home health Diet recommendation:  Discharge Diet Orders (From admission, onward)     Start     Ordered   02/13/24 0000  Diet Carb Modified        02/13/24 1030   02/13/24 0000  Diet - low sodium heart healthy        02/13/24 1030           Cardiac and Carb modified diet DISCHARGE MEDICATION: Allergies as of 02/13/2024   No Known Allergies      Medication List     PAUSE taking these medications    chlorthalidone  25 MG tablet Wait to take this until your doctor or other care provider tells you to start again. Commonly known as: HYGROTON  Take 25 mg by mouth daily.   losartan  50 MG tablet Wait to take this until your doctor or other care provider tells you to start again. Commonly known as: COZAAR  TAKE 1 TABLET BY MOUTH DAILY       STOP taking these medications    furosemide  20 MG tablet Commonly known as: LASIX    lactulose  10 GM/15ML solution Commonly known as: CHRONULAC    potassium chloride  SA 20 MEQ tablet Commonly known as: KLOR-CON  M       TAKE these medications    Accu-Chek Guide Test test strip Generic drug: glucose blood CHECK BLOOD SUGAR TWICE DAILY   Accu-Chek Softclix Lancets lancets Use with device to check sugars twice daily   albuterol  108 (90 Base) MCG/ACT inhaler Commonly known as: VENTOLIN  HFA INHALE 2 PUFFS INTO THE LUNGS EVERY 6 HOURS AS NEEDED FOR WHEEZING OR SHORTNESS OF BREATH   apixaban  5 MG Tabs tablet Commonly known as: Eliquis  Take 1 tablet (5 mg total) by mouth 2 (two) times daily.   aspirin  EC 81 MG tablet Take 1 tablet (81 mg total) by mouth daily. Swallow whole.   BD Pen Needle  Nano U/F 32G X 4 MM Misc Generic drug: Insulin  Pen Needle 1 each by Does not apply route in the morning, at noon, in the evening, and at bedtime.   cyanocobalamin  1000 MCG tablet Commonly known as: VITAMIN B12 Take 1,000 mcg by mouth daily.   Dexcom G7 Sensor Misc Use to monitor blood sugar continuously. Change sensor every 10 days as directed. Dx E11.65   dorzolamide -timolol  2-0.5 % ophthalmic solution Commonly known as: COSOPT  Place 1 drop into both eyes 2 (two) times daily.   doxepin  25 MG capsule Commonly known as: SINEQUAN  Take 25-50 mg by mouth at bedtime.   doxycycline  100 MG tablet Commonly known as: VIBRA -TABS Take 1 tablet (100 mg total) by mouth 2 (two) times daily for 5 days.   DULoxetine   60 MG capsule Commonly known as: CYMBALTA  TAKE 1 CAPSULE BY MOUTH DAILY   esomeprazole  40 MG capsule Commonly known as: NEXIUM  Take 1 capsule (40 mg total) by mouth daily.   ferrous sulfate  325 (65 FE) MG tablet Take 325 mg by mouth daily with breakfast.   Fluocinolone Acetonide Scalp 0.01 % Oil   fluticasone  50 MCG/ACT nasal spray Commonly known as: FLONASE  Place 2 sprays into both nostrils daily as needed for allergies.   insulin  lispro 100 UNIT/ML KwikPen Commonly known as: HUMALOG  Inject 2 Units into the skin 3 (three) times daily with meals. If eating and Blood Glucose (BG) 80 or higher inject 2 units for meal coverage and add correction dose per scale. If not eating, correction dose only. BG <150= 0 unit; BG 150-200= 1 unit; BG 201-250= 2 unit; BG 251-300= 3 unit; BG 301-350= 4 unit; BG 351-400= 5 unit; BG >400= 6 unit and Call Primary Care.   Lantus  SoloStar 100 UNIT/ML Solostar Pen Generic drug: insulin  glargine Inject 10 Units into the skin at bedtime.   metFORMIN  500 MG tablet Commonly known as: GLUCOPHAGE  TAKE 1 TABLET BY MOUTH TWICE A DAY   polyethylene glycol 17 g packet Commonly known as: MIRALAX  / GLYCOLAX  Take 17 g by mouth daily.   pravastatin  40  MG tablet Commonly known as: PRAVACHOL  TAKE 1 TABLET BY MOUTH DAILY   pregabalin  150 MG capsule Commonly known as: Lyrica  Take 1 capsule (150 mg total) by mouth 2 (two) times daily.   traMADol  50 MG tablet Commonly known as: ULTRAM  Take 50 mg by mouth 3 (three) times daily as needed.        Contact information for after-discharge care     Home Medical Care     [facility]) .   Service: Home Health Services Why: Patient to make appt Contact information: [address] [place]  [phone]                    Discharge Exam: Wanda Gardner   02/11/24 0500 02/12/24 0500 02/13/24 0500  Weight: 90.4 kg 90.3 kg 91.1 kg   Gen: Appears comfortable and in no distress, obese HENT: [place]/AT, pale conjunctiva CV: Sinus rhythm with premature beats.  Good radial pulse Lung: CTAB, decreased movements at bases Abd: No TTP, normal bowel sounds MSK: No asymmetry, TTP over right hip Neuro: alert and oriented x 4    Condition at discharge: stable  The results of significant diagnostics from this hospitalization (including imaging, microbiology, ancillary and laboratory) are listed below for reference.   Imaging Studies: CT CHEST WO CONTRAST Result Date: 02/11/2024 EXAM: CT CHEST WITHOUT CONTRAST 02/11/2024 09:59:09 AM TECHNIQUE: CT of the chest was performed without the administration of intravenous contrast. Multiplanar reformatted images are provided for review. Automated exposure control, iterative reconstruction, and/or weight based adjustment of the mA/kV was utilized to reduce the radiation dose to as low as reasonably achievable. COMPARISON: Same day x-ray and CT dated 06/10/2022. CLINICAL HISTORY: Pneumonia, complication suspected, xray done. FINDINGS: MEDIASTINUM: Aortic and coronary artery atherosclerotic calcification. Heart and pericardium are otherwise unremarkable. The central airways are clear. LYMPH  NODES: No mediastinal, hilar or axillary lymphadenopathy. LUNGS AND PLEURA: Subpleural wedge-shaped consolidation in the lingula measuring 2.1 x 1.5 cm with associated surrounding ground glass. Differential considerations include focal pneumonia, round atelectasis, or pulmonary infarct. Scarring in the lower lobes. No pulmonary edema. No pleural effusion or pneumothorax. SOFT TISSUES/BONES: No  acute abnormality of the bones or soft tissues. UPPER ABDOMEN: Limited images of the upper abdomen demonstrates no acute abnormality. IMPRESSION: 1. Subpleural wedge-shaped consolidation in the lingula with associated surrounding ground glass, measuring 2.1 cm. Differential considerations include focal pneumonia, round atelectasis, or pulmonary infarct. If there is concern for PE, CTA is recommended. Otherwise follow up in 3 months is recommended to ensure stability / resolution. Electronically signed by: Norman Gatlin MD 02/11/2024 07:18 PM EST RP Workstation: [id number]   DG HIP UNILAT WITH PELVIS 2-3 VIEWS RIGHT Result Date: 02/10/2024 CLINICAL DATA:  Hip pain. EXAM: DG HIP (WITH OR WITHOUT PELVIS) 2-3V RIGHT COMPARISON:  Radiograph 06/13/2016 FINDINGS: Right hip arthroplasty in expected alignment. No periprosthetic lucency. No acute or periprosthetic fracture. Pubic rami are intact. Pubic symphysis and sacroiliac joints are congruent. Surgical clips in the pelvis. Left iliac stent. IMPRESSION: Right hip arthroplasty without complication or acute abnormality. Electronically Signed   By: Andrea Gasman M.D.   On: 02/10/2024 14:42   DG Chest Portable 1 View Result Date: 02/10/2024 CLINICAL DATA:  Altered mental status, fall. EXAM: PORTABLE CHEST 1 VIEW COMPARISON:  10/15/2023 FINDINGS: Lung volumes are low. Patchy opacity in the lung bases, left greater than right. The heart is normal in size with stable mediastinal contours. Possible small left pleural effusion. No pneumothorax. No pulmonary edema. On limited  assessment, no acute osseous findings. IMPRESSION: Low lung volumes with patchy bibasilar opacities, left greater than right, atelectasis versus pneumonia. Possible small left pleural effusion. Electronically Signed   By: Andrea Gasman M.D.   On: 02/10/2024 12:05   CT Cervical Spine Wo Contrast Result Date: 02/10/2024 EXAM: CT CERVICAL SPINE WITHOUT CONTRAST 02/10/2024 11:12:00 AM TECHNIQUE: CT of the cervical spine was performed without the administration of intravenous contrast. Multiplanar reformatted images are provided for review. Automated exposure control, iterative reconstruction, and/or weight based adjustment of the mA/kV was utilized to reduce the radiation dose to as low as reasonably achievable. COMPARISON: 10/15/2023 CLINICAL HISTORY: Neck trauma (Age >= 65y). Patient reports multiple falls last night. FINDINGS: BONES AND ALIGNMENT: No acute fracture or traumatic malalignment. Slight degenerative retrolisthesis at C4-C5, C5-C6, and C6-C7 is stable. DEGENERATIVE CHANGES: Uncovertebral spurring contributes to moderate left foraminal narrowing at C4-C5 and C6-C7 and moderate bilateral foraminal stenosis at C5-C6. SOFT TISSUES: No prevertebral soft tissue swelling. IMPRESSION: 1. No acute findings. Electronically signed by: Lonni Necessary MD 02/10/2024 11:44 AM EST RP Workstation: [id number]   CT HEAD WO CONTRAST Result Date: 02/10/2024 EXAM: CT HEAD WITHOUT CONTRAST 02/10/2024 11:12:00 AM TECHNIQUE: CT of the head was performed without the administration of intravenous contrast. Automated exposure control, iterative reconstruction, and/or weight based adjustment of the mA/kV was utilized to reduce the radiation dose to as low as reasonably achievable. COMPARISON: 10/15/2023 CLINICAL HISTORY: Facial trauma, blunt. Multiple falls last night. FINDINGS: BRAIN AND VENTRICLES: No acute hemorrhage. No evidence of acute infarct. No hydrocephalus. No extra-axial collection. No mass effect or  midline shift. Stable atrophy. Mild periventricular and deep white matter hypodensity typical of chronic small vessel ischemia. ORBITS: Bilateral cataract resection noted. Remote right medial orbital fracture. Right-sided scleral banding is noted. SINUSES: No acute abnormality. SOFT TISSUES AND SKULL: No acute soft tissue abnormality. No skull fracture. Atherosclerosis of skullbase vasculature without hyperdense vessel or abnormal calcification. IMPRESSION: 1. No acute intracranial abnormality. Electronically signed by: Lonni Necessary MD 02/10/2024 11:32 AM EST RP Workstation: [id number]   VAS US  ABI WITH/WO TBI Result Date: 01/16/2024  LOWER EXTREMITY DOPPLER STUDY Patient Name:  [name]  Date of Exam:   01/15/2024 Medical Rec #: [id number]        Accession #:    [id number] Date of Birth: Dec 12, 1940        Patient Gender: F Patient Age:   25 years Exam [facility] Procedure:      VAS US  ABI WITH/WO TBI Referring Phys: [facility] --------------------------------------------------------------------------------  Indications: Claudication, and peripheral artery disease. High Risk Factors: Hyperlipidemia.  Vascular Interventions: 10/19/2023 Bilat CIA PTA and stents                         12/25/2023 Lt SFA thrombectomy and stent. Comparison Study: 11/22/2023 Performing Technologist: Jerel Croak RVT  Examination Guidelines: A complete evaluation includes at minimum, Doppler waveform signals and systolic blood pressure reading at the level of bilateral brachial, anterior tibial, and posterior tibial arteries, when vessel segments are accessible. Bilateral testing is considered an integral part of a complete examination. Photoelectric Plethysmograph (PPG) waveforms and toe systolic pressure readings are included as required and additional duplex testing as needed. Limited examinations for reoccurring indications may be performed as noted.  ABI Findings:  +---------+------------------+-----+----------+--------+ Right    Rt Pressure (mmHg)IndexWaveform  Comment  +---------+------------------+-----+----------+--------+ Brachial 140                                       +---------+------------------+-----+----------+--------+ PTA      134               0.96 monophasic         +---------+------------------+-----+----------+--------+ DP       125               0.89 monophasic         +---------+------------------+-----+----------+--------+ Great Toe92                0.66 Abnormal           +---------+------------------+-----+----------+--------+ +---------+------------------+-----+---------+-------+ Left     Lt Pressure (mmHg)IndexWaveform Comment +---------+------------------+-----+---------+-------+ Brachial 140                                     +---------+------------------+-----+---------+-------+ PTA      158               1.13 triphasic        +---------+------------------+-----+---------+-------+ DP       148               1.06 triphasic        +---------+------------------+-----+---------+-------+ Burnetta Gambler               1.13 Normal           +---------+------------------+-----+---------+-------+ +-------+-----------+-----------+------------+------------+ ABI/TBIToday's ABIToday's TBIPrevious ABIPrevious TBI +-------+-----------+-----------+------------+------------+ Right  .96        .66        .99         .72          +-------+-----------+-----------+------------+------------+ Left   1.13       1.13       .75         .70          +-------+-----------+-----------+------------+------------+ Left ABIs and TBIs appear increased compared to prior study on 11/22/2023.  Summary: Right: Resting right ankle-brachial index is within normal  range. The right toe-brachial index is normal.  Left: Resting left ankle-brachial index is within normal range. The left toe-brachial index is normal.   *See table(s) above for measurements and observations.  Electronically signed by Cordella Shawl MD on 01/16/2024 at 8:54:12 AM.    Final     Microbiology: Results for orders placed or performed during the hospital encounter of 02/10/24  Resp panel by RT-PCR (RSV, Flu A&B, Covid) Anterior Nasal Swab     Status: None   Collection Time: 02/10/24 11:49 AM   Specimen: Anterior Nasal Swab  Result Value Ref Range Status   SARS Coronavirus 2 by RT PCR NEGATIVE NEGATIVE Final    Comment: (NOTE) SARS-CoV-2 target nucleic acids are NOT DETECTED.  The SARS-CoV-2 RNA is generally detectable in upper respiratory specimens during the acute phase of infection. The lowest concentration of SARS-CoV-2 viral copies this assay can detect is 138 copies/mL. A negative result does not preclude SARS-Cov-2 infection and should not be used as the sole basis for treatment or other patient management decisions. A negative result may occur with  improper specimen collection/handling, submission of specimen other than nasopharyngeal swab, presence of viral mutation(s) within the areas targeted by this assay, and inadequate number of viral copies(<138 copies/mL). A negative result must be combined with clinical observations, patient history, and epidemiological information. The expected result is Negative.  Fact Sheet for Patients:  bloggercourse.com  Fact Sheet for Healthcare Providers:  seriousbroker.it  This test is no t yet approved or cleared by the [place]  FDA and  has been authorized for detection and/or diagnosis of SARS-CoV-2 by FDA under an Emergency Use Authorization (EUA). This EUA will remain  in effect (meaning this test can be used) for the duration of the COVID-19 declaration under Section 564(b)(1) of the Act, 21 U.S.C.section 360bbb-3(b)(1), unless the authorization is terminated  or revoked sooner.       Influenza A by PCR NEGATIVE  NEGATIVE Final   Influenza B by PCR NEGATIVE NEGATIVE Final    Comment: (NOTE) The Xpert Xpress SARS-CoV-2/FLU/RSV plus assay is intended as an aid in the diagnosis of influenza from Nasopharyngeal swab specimens and should not be used as a sole basis for treatment. Nasal washings and aspirates are unacceptable for Xpert Xpress SARS-CoV-2/FLU/RSV testing.  Fact Sheet for Patients: bloggercourse.com  Fact Sheet for Healthcare Providers: seriousbroker.it  This test is not yet approved or cleared by the [place]  FDA and has been authorized for detection and/or diagnosis of SARS-CoV-2 by FDA under an Emergency Use Authorization (EUA). This EUA will remain in effect (meaning this test can be used) for the duration of the COVID-19 declaration under Section 564(b)(1) of the Act, 21 U.S.C. section 360bbb-3(b)(1), unless the authorization is terminated or revoked.     Resp Syncytial Virus by PCR NEGATIVE NEGATIVE Final    Comment: (NOTE) Fact Sheet for Patients: bloggercourse.com  Fact Sheet for Healthcare Providers: seriousbroker.it  This test is not yet approved or cleared by the [place]  FDA and has been authorized for detection and/or diagnosis of SARS-CoV-2 by FDA under an Emergency Use Authorization (EUA). This EUA will remain in effect (meaning this test can be used) for the duration of the COVID-19 declaration under Section 564(b)(1) of the Act, 21 U.S.C. section 360bbb-3(b)(1), unless the authorization is terminated or revoked.  Performed at [facility], [address] [place], [place] [address]   Blood Culture (routine x 2)     Status: None (Preliminary result)   Collection  Time: 02/10/24 11:51 AM   Specimen: BLOOD  Result Value Ref Range Status   Specimen Description BLOOD  Final   Special Requests   Final    BOTTLES DRAWN AEROBIC AND ANAEROBIC  Blood Culture adequate volume   Culture   Final    NO GROWTH 2 DAYS Performed at [facility], [address] [place], [place] [address]    Report Status PENDING  Incomplete  Blood Culture (routine x 2)     Status: None (Preliminary result)   Collection Time: 02/10/24 11:51 AM   Specimen: BLOOD  Result Value Ref Range Status   Specimen Description BLOOD  Final   Special Requests Blood Culture adequate volume  Final   Culture   Final    NO GROWTH 2 DAYS Performed at [facility], [address], [place], [place] [address]    Report Status PENDING  Incomplete  Urine Culture (for pregnant, neutropenic or urologic patients or patients with an indwelling urinary catheter)     Status: None   Collection Time: 02/10/24  1:15 PM   Specimen: Urine, Random  Result Value Ref Range Status   Specimen Description   Final    URINE, RANDOM Performed at [facility], [address] [place], [place] [address]    Special Requests   Final    NONE Performed at [facility], [address], [place], [place] [address]    Culture   Final    NO GROWTH Performed at [facility], [address], [place], [place] [address]    Report Status 02/12/2024 FINAL  Final    Labs: CBC: Recent Labs  Lab 02/10/24 1149 02/11/24 0829 02/13/24 0848  WBC 7.2 6.0 5.0  HGB 9.6* 8.8* 9.0*  HCT 30.8* 28.3* 28.2*  MCV 87.3 88.2 84.9  PLT 344 337 324   Basic Metabolic Panel: Recent Labs  Lab 02/10/24 1149 02/11/24 0829 02/13/24 0848  NA 138 142 142  K 4.1 3.7 3.8  CL 99 103 107  CO2 30 32 28  GLUCOSE 168* 112* 127*  BUN 19 12 13   CREATININE 1.01* 0.85 0.86  CALCIUM 9.4 8.7* 8.7*  MG 2.0  --   --    Liver Function Tests: Recent Labs  Lab 02/10/24 1149  AST 31  ALT 13  ALKPHOS 113  BILITOT 0.2  PROT 6.9  ALBUMIN 3.9   CBG: Recent Labs  Lab 02/12/24 0810 02/12/24 1200 02/12/24 1725 02/12/24 2045 02/13/24 0752  GLUCAP 164* 195* 178* 165*  131*    Discharge time spent: greater than 30 minutes.  Signed: Aimee Somerset, MD [facility] 02/13/2024 "

## 2024-02-13 NOTE — Progress Notes (Signed)
 Physical Therapy Treatment Patient Details Name: Wanda Gardner MRN: [id number] DOB: January 31, 1941 Today's Date: 02/13/2024   History of Present Illness Pt is an 83 y/o F admitted on 02/10/24 after presenting with c/o generalized weakness & falls. Imaging favored for atelectasis. PMH: HLD, HTN, PAD, DM2, a-fib, CKD 3A, depression, glaucoma    PT Comments  Pt received sitting EOB preparing for d/c home with HHPT. Continues to demonstrate good bed mobility and transfers with distant supervision. No dizziness in standing during gait training with RW. Pt states L knee has a Hx of buckling. Pt noted to also have black Left 3rd, 4th, and 5th toes, notified MD for potential out pt follow up as needed. Pt has a RW and BSC at home and feels comfortable with today's d/c.   If plan is discharge home, recommend the following: Assistance with cooking/housework;Assist for transportation;Help with stairs or ramp for entrance   Can travel by private vehicle        Equipment Recommendations  None recommended by PT (Pt has a RW and BSC at home)    Recommendations for Other Services       Precautions / Restrictions Precautions Precautions: Fall Recall of Precautions/Restrictions: Intact Restrictions Weight Bearing Restrictions Per Provider Order: No     Mobility  Bed Mobility Overal bed mobility: Modified Independent Bed Mobility: Supine to Sit           General bed mobility comments: increased time/effort    Transfers Overall transfer level: Needs assistance Equipment used: Rolling walker (2 wheels) Transfers: Sit to/from Stand Sit to Stand: Supervision                Ambulation/Gait Ambulation/Gait assistance: Supervision Gait Distance (Feet): 85 Feet Assistive device: Rolling walker (2 wheels) Gait Pattern/deviations: Step-through pattern, Decreased stride length, Trunk flexed Gait velocity: decr     General Gait Details:  (No LOB, L knee has Hx of buckling)   Stairs              Wheelchair Mobility     Tilt Bed    Modified Rankin (Stroke Patients Only)       Balance Overall balance assessment: Needs assistance, History of Falls Sitting-balance support: Feet supported Sitting balance-Leahy Scale: Good     Standing balance support: Bilateral upper extremity supported, During functional activity, Reliant on assistive device for balance Standing balance-Leahy Scale: Fair Standing balance comment: RW                            Communication Communication Communication: No apparent difficulties  Cognition Arousal: Alert Behavior During Therapy: WFL for tasks assessed/performed   PT - Cognitive impairments: No apparent impairments                         Following commands: Intact      Cueing Cueing Techniques: Verbal cues  Exercises Other Exercises Other Exercises:  (Pt encouraged to f/u with Podiatry regarding L foot)    General Comments General comments (skin integrity, edema, etc.):  (Left 3, 4, and 5th toes black with nail loss)      Pertinent Vitals/Pain Pain Assessment Pain Assessment: No/denies pain    Home Living                          Prior Function            PT Goals (current  goals can now be found in the care plan section) Acute Rehab PT Goals Patient Stated Goal: none stated Progress towards PT goals: Progressing toward goals    Frequency    Min 2X/week      PT Plan      Co-evaluation              AM-PAC PT 6 Clicks Mobility   Outcome Measure  Help needed turning from your back to your side while in a flat bed without using bedrails?: A Little Help needed moving from lying on your back to sitting on the side of a flat bed without using bedrails?: A Little Help needed moving to and from a bed to a chair (including a wheelchair)?: A Little Help needed standing up from a chair using your arms (e.g., wheelchair or bedside chair)?: A Little Help needed to  walk in hospital room?: A Little Help needed climbing 3-5 steps with a railing? : A Little 6 Click Score: 18    End of Session Equipment Utilized During Treatment: Gait belt Activity Tolerance: Patient tolerated treatment well Patient left: in chair;with chair alarm set;with call bell/phone within reach Nurse Communication: Mobility status PT Visit Diagnosis: History of falling (Z91.81);Other abnormalities of gait and mobility (R26.89);Muscle weakness (generalized) (M62.81)     Time: 8879-8858 PT Time Calculation (min) (ACUTE ONLY): 21 min  Charges:    $Therapeutic Activity: 8-22 mins PT General Charges $$ ACUTE PT VISIT: 1 Visit                    Wanda Gardner, PTA  Wanda Gardner 02/13/2024, 11:56 AM

## 2024-02-13 NOTE — Consult Note (Incomplete)
 [facility] Consult    Reason for Consult:  *** Requesting Physician:  *** MRN #:  [id number]  History of Present Illness: This is a 83 y.o. female ***  Past Medical History:  Diagnosis Date   Acid reflux    AKI (acute kidney injury) 06/10/2022   Arthritis    Asthma    Chest pain, unspecified 10/30/2013   Formatting of this note might be different from the original. Note: Unchanged Note: Unchanged Formatting of this note might be different from the original. Note: Unchanged Note: Unchanged   Constipation 04/20/2011   Formatting of this note might be different from the original. Note: Unchanged - probably CIC. Note: Unchanged - probably CIC.   COPD (chronic obstructive pulmonary disease) (HCC)    Cough 10/30/2013   Formatting of this note might be different from the original. Note: Unchanged Note: Unchanged   Depression    Diabetes mellitus (HCC) 01/10/2022   Diabetes mellitus without complication (HCC)    Dyspnea    Glaucoma    Headache    right side of head   Headache 07/25/2011   Hyperlipidemia    Hypertension    Knee stiff 05/12/2022   Lens replaced 03/22/2010   Neuropathic pain of hands and feet    PONV (postoperative nausea and vomiting) 1982   back with my hysterectomy.   Pseudophakia 03/22/2010   Sleep apnea    Doesn't use CPAP   Stiffness of shoulder joint 05/12/2022   Trigger finger of right hand 01/10/2022   Wears dentures    full upper and lower    Past Surgical History:  Procedure Laterality Date   ABDOMINAL HYSTERECTOMY     CATARACT EXTRACTION W/ INTRAOCULAR LENS  IMPLANT, BILATERAL     CATARACT EXTRACTION W/PHACO Left 12/14/2021   Procedure: CATARACT EXTRACTION PHACO AND INTRAOCULAR LENS PLACEMENT (IOC) LEFT DIABETIC 24.79 02:23.9;  Surgeon: Mittie Gaskin, MD;  Location: [facility];  Service: Ophthalmology;  Laterality: Left;  Diabetic   CHOLECYSTECTOMY     COLONOSCOPY WITH PROPOFOL  N/A 10/29/2014   Procedure: COLONOSCOPY WITH PROPOFOL ;   Surgeon: Deward CINDERELLA Piedmont, MD;  Location: [facility] END[facility];  Service: Gastroenterology;  Laterality: N/A;   ESOPHAGOGASTRODUODENOSCOPY (EGD) WITH PROPOFOL  N/A 10/29/2014   Procedure: ESOPHAGOGASTRODUODENOSCOPY (EGD) WITH PROPOFOL ;  Surgeon: Deward CINDERELLA Piedmont, MD;  Location: [facility] ENDOSCOPY;  Service: Gastroenterology;  Laterality: N/A;   EYE SURGERY Bilateral 2011   lid lift done 3 times   FOOT SURGERY     HAND SURGERY     LOWER EXTREMITY ANGIOGRAPHY Left 10/19/2023   Procedure: Lower Extremity Angiography;  Surgeon: Jama Cordella MATSU, MD;  Location: [facility];  Service: Cardiovascular;  Laterality: Left;   LOWER EXTREMITY ANGIOGRAPHY Left 12/25/2023   Procedure: Lower Extremity Angiography;  Surgeon: Jama Cordella MATSU, MD;  Location: [facility];  Service: Cardiovascular;  Laterality: Left;   LOWER EXTREMITY INTERVENTION Left 10/19/2023   Procedure: LOWER EXTREMITY INTERVENTION;  Surgeon: Jama Cordella MATSU, MD;  Location: [facility];  Service: Cardiovascular;  Laterality: Left;   TONGUE SURGERY     removal of cancer   TONSILLECTOMY     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/13/2016   Procedure: TOTAL HIP ARTHROPLASTY ANTERIOR APPROACH;  Surgeon: Ozell Flake, MD;  Location: [facility] ORS;  Service: Orthopedics;  Laterality: Right;   TOTAL KNEE ARTHROPLASTY Right 11/23/2015   Procedure: TOTAL KNEE ARTHROPLASTY;  Surgeon: Ozell Flake, MD;  Location: [facility] ORS;  Service: Orthopedics;  Laterality: Right;   VOCAL CORD  LATERALIZATION, ENDOSCOPIC APPROACH W/ MLB      Allergies[1]  Prior to Admission medications  Medication Sig Start Date End Date Taking? Authorizing Provider  albuterol  (VENTOLIN  HFA) 108 (90 Base) MCG/ACT inhaler INHALE 2 PUFFS INTO THE LUNGS EVERY 6 HOURS AS NEEDED FOR WHEEZING OR SHORTNESS OF BREATH 01/24/24  Yes Orlean Alan HERO, FNP  apixaban  (ELIQUIS ) 5 MG TABS tablet Take 1 tablet (5 mg total) by mouth 2 (two) times daily. 10/20/23  Yes Josette Ade, MD  aspirin  EC 81 MG  tablet Take 1 tablet (81 mg total) by mouth daily. Swallow whole. 10/21/23  Yes Wieting, Richard, MD  [Paused] chlorthalidone  (HYGROTON ) 25 MG tablet Take 25 mg by mouth daily. Wait to take this until your doctor or other care provider tells you to start again. 01/03/24  Yes [provider]  dorzolamide -timolol  (COSOPT ) 2-0.5 % ophthalmic solution Place 1 drop into both eyes 2 (two) times daily. 11/24/22  Yes [provider]  doxepin  (SINEQUAN ) 25 MG capsule Take 25-50 mg by mouth at bedtime. 11/13/23  Yes [provider]  doxycycline  (VIBRA -TABS) 100 MG tablet Take 1 tablet (100 mg total) by mouth 2 (two) times daily for 5 days. 02/13/24 02/18/24 Yes Agbata, Tochukwu, MD  DULoxetine  (CYMBALTA ) 60 MG capsule TAKE 1 CAPSULE BY MOUTH DAILY 01/24/24  Yes Orlean Alan HERO, FNP  esomeprazole  (NEXIUM ) 40 MG capsule Take 1 capsule (40 mg total) by mouth daily. 05/07/23  Yes Orlean Alan HERO, FNP  ferrous sulfate  325 (65 FE) MG tablet Take 325 mg by mouth daily with breakfast.   Yes [provider]  Fluocinolone Acetonide Scalp 0.01 % OIL  01/03/24  Yes [provider]  fluticasone  (FLONASE ) 50 MCG/ACT nasal spray Place 2 sprays into both nostrils daily as needed for allergies. 05/07/23  Yes Orlean Alan HERO, FNP  furosemide  (LASIX ) 20 MG tablet TAKE 1 TABLET BY MOUTH DAILY 01/24/24  Yes Orlean Alan HERO, FNP  insulin  glargine (LANTUS  SOLOSTAR) 100 UNIT/ML Solostar Pen Inject 10 Units into the skin at bedtime. 11/15/23  Yes Orlean Alan HERO, FNP  insulin  lispro (HUMALOG ) 100 UNIT/ML KwikPen Inject 2 Units into the skin 3 (three) times daily with meals. If eating and Blood Glucose (BG) 80 or higher inject 2 units for meal coverage and add correction dose per scale. If not eating, correction dose only. BG <150= 0 unit; BG 150-200= 1 unit; BG 201-250= 2 unit; BG 251-300= 3 unit; BG 301-350= 4 unit; BG 351-400= 5 unit; BG >400= 6 unit and Call Primary Care. 10/20/23  Yes  Wieting, Richard, MD  [Paused] losartan  (COZAAR ) 50 MG tablet TAKE 1 TABLET BY MOUTH DAILY Wait to take this until your doctor or other care provider tells you to start again. 12/20/23  Yes Orlean Alan HERO, FNP  metFORMIN  (GLUCOPHAGE ) 500 MG tablet TAKE 1 TABLET BY MOUTH TWICE A DAY 01/07/24  Yes Orlean Alan HERO, FNP  polyethylene glycol (MIRALAX  / GLYCOLAX ) 17 g packet Take 17 g by mouth daily. 10/21/23  Yes Wieting, Richard, MD  potassium chloride  SA (K-DUR,KLOR-CON ) 20 MEQ tablet Take 20 mEq by mouth daily.   Yes [provider]  pravastatin  (PRAVACHOL ) 40 MG tablet TAKE 1 TABLET BY MOUTH DAILY 05/09/23  Yes Orlean Alan HERO, FNP  pregabalin  (LYRICA ) 150 MG capsule Take 1 capsule (150 mg total) by mouth 2 (two) times daily. 01/15/24  Yes Brown, Fallon E, NP  traMADol  (ULTRAM ) 50 MG tablet Take 50 mg by mouth 3 (three) times daily as  needed. 01/28/24  Yes [provider]  vitamin B-12 (CYANOCOBALAMIN ) 1000 MCG tablet Take 1,000 mcg by mouth daily.   Yes [provider]  Accu-Chek Softclix Lancets lancets Use with device to check sugars twice daily 03/02/23   Orlean Alan HERO, FNP  Continuous Glucose Sensor (DEXCOM G7 SENSOR) MISC Use to monitor blood sugar continuously. Change sensor every 10 days as directed. Dx E11.65 11/27/23   Orlean Alan HERO, FNP  glucose blood (ACCU-CHEK GUIDE TEST) test strip CHECK BLOOD SUGAR TWICE DAILY 03/01/23   Orlean Alan HERO, FNP  Insulin  Pen Needle (BD PEN NEEDLE NANO U/F) 32G X 4 MM MISC 1 each by Does not apply route in the morning, at noon, in the evening, and at bedtime. 05/07/23   Orlean Alan HERO, FNP  lactulose  (CHRONULAC ) 10 GM/15ML solution Take 15 mLs (10 g total) by mouth 2 (two) times daily as needed for mild constipation, moderate constipation or severe constipation. Patient not taking: Reported on 11/06/2023 05/07/23   Orlean Alan HERO, FNP    Social History   Socioeconomic History   Marital status: Divorced    Spouse  name: Not on file   Number of children: Not on file   Years of education: Not on file   Highest education level: Not on file  Occupational History   Not on file  Tobacco Use   Smoking status: Every Day    Current packs/day: 0.50    Average packs/day: 0.5 packs/day for 50.0 years (25.0 ttl pk-yrs)    Types: Cigarettes   Smokeless tobacco: Never  Vaping Use   Vaping status: Never Used  Substance and Sexual Activity   Alcohol use: Yes    Alcohol/week: 0.0 standard drinks of alcohol    Comment: 1 glass of wine twice a year   Drug use: Yes    Types: Marijuana    Comment: weed- daily use for pain relief when she can get  it   Sexual activity: Not Currently  Other Topics Concern   Not on file  Social History Narrative   Not on file   Social Drivers of Health   Tobacco Use: High Risk (02/10/2024)   Patient History    Smoking Tobacco Use: Every Day    Smokeless Tobacco Use: Never    Passive Exposure: Not on file  Financial Resource Strain: High Risk (10/29/2023)   Overall Financial Resource Strain (CARDIA)    Difficulty of Paying Living Expenses: Hard  Food Insecurity: Food Insecurity Present (02/10/2024)   Epic    Worried About Programme Researcher, Broadcasting/film/video in the Last Year: Sometimes true    Ran Out of Food in the Last Year: Never true  Transportation Needs: No Transportation Needs (02/10/2024)   Epic    Lack of Transportation (Medical): No    Lack of Transportation (Non-Medical): No  Physical Activity: Not on file  Stress: Not on file  Social Connections: Moderately Isolated (02/10/2024)   Social Connection and Isolation Panel    Frequency of Communication with Friends and Family: More than three times a week    Frequency of Social Gatherings with Friends and Family: More than three times a week    Attends Religious Services: 1 to 4 times per year    Active Member of Clubs or Organizations: Patient unable to answer    Attends Banker Meetings: Never    Marital  Status: Widowed  Intimate Partner Violence: Not At Risk (02/10/2024)   Epic    Fear of Current  or Ex-Partner: No    Emotionally Abused: No    Physically Abused: No    Sexually Abused: No  Depression (PHQ2-9): High Risk (05/07/2023)   Depression (PHQ2-9)    PHQ-2 Score: 19  Alcohol Screen: Not on file  Housing: High Risk (02/10/2024)   Epic    Unable to Pay for Housing in the Last Year: Yes    Number of Times Moved in the Last Year: 0    Homeless in the Last Year: No  Utilities: At Risk (02/10/2024)   Epic    Threatened with loss of utilities: Yes  Health Literacy: Not on file    *** Family History  Problem Relation Age of Onset   Cancer Father    Heart disease Mother     ROS: Otherwise negative unless mentioned in HPI  Physical Examination  Vitals:   02/13/24 0434 02/13/24 0751  BP: (!) 130/53 (!) 129/57  Pulse: 68 63  Resp: 15 14  Temp: 98.6 F (37 C) 98.3 F (36.8 C)  SpO2: 100% 98%   Body mass index is 33.42 kg/m.  General:  WDWN in NAD Gait: Not observed HENT: WNL, normocephalic Pulmonary: normal non-labored breathing, without Rales, rhonchi,  wheezing Cardiac: {Desc; regular/irreg:14544}, without  Murmurs, rubs or gallops; {With/Without:20273} carotid bruits Abdomen: *** soft, NT/ND, no masses Skin: {With/Without:20273} rashes Vascular Exam/Pulses: *** Extremities: {With/Without:20273} ischemic changes, {With/Without:20273} Gangrene , {With/Without:20273} cellulitis; {With/Without:20273} open wounds;  Musculoskeletal: no muscle wasting or atrophy  Neurologic: A&O X 3;  No focal weakness or paresthesias are detected; speech is fluent/normal Psychiatric:  The pt has {Desc; normal/abnormal:11317::Normal} affect. Lymph:  Unremarkable  CBC    Component Value Date/Time   WBC 5.0 02/13/2024 0848   RBC 3.32 (L) 02/13/2024 0848   HGB 9.0 (L) 02/13/2024 0848   HGB 9.2 (L) 12/14/2022 1032   HCT 28.2 (L) 02/13/2024 0848   HCT 29.4 (L) 12/14/2022 1032    PLT 324 02/13/2024 0848   PLT 434 12/14/2022 1032   MCV 84.9 02/13/2024 0848   MCV 82 12/14/2022 1032   MCV 88 04/09/2011 0323   MCH 27.1 02/13/2024 0848   MCHC 31.9 02/13/2024 0848   RDW 14.2 02/13/2024 0848   RDW 14.1 12/14/2022 1032   RDW 14.7 (H) 04/09/2011 0323   LYMPHSABS 1.4 12/14/2022 1032   LYMPHSABS 1.7 04/09/2011 0323   MONOABS 0.3 06/12/2022 0549   MONOABS 0.3 04/09/2011 0323   EOSABS 0.2 12/14/2022 1032   EOSABS 0.2 04/09/2011 0323   BASOSABS 0.0 12/14/2022 1032   BASOSABS 0.0 04/09/2011 0323    BMET    Component Value Date/Time   NA 142 02/13/2024 0848   NA 141 12/14/2022 1032   NA 141 04/09/2011 0323   K 3.8 02/13/2024 0848   K 4.1 04/09/2011 0323   CL 107 02/13/2024 0848   CL 106 04/09/2011 0323   CO2 28 02/13/2024 0848   CO2 26 04/09/2011 0323   GLUCOSE 127 (H) 02/13/2024 0848   GLUCOSE 210 (H) 04/09/2011 0323   BUN 13 02/13/2024 0848   BUN 20 12/14/2022 1032   BUN 16 04/09/2011 0323   CREATININE 0.86 02/13/2024 0848   CREATININE 0.96 04/09/2011 0323   CALCIUM 8.7 (L) 02/13/2024 0848   CALCIUM 8.2 (L) 04/09/2011 0323   GFRNONAA >60 02/13/2024 0848   GFRNONAA >60 04/09/2011 0323   GFRAA >60 06/16/2016 1504   GFRAA >60 04/09/2011 0323    COAGS: Lab Results  Component Value Date   INR 1.1 02/10/2024  INR 1.4 (H) 06/10/2022   INR 1.00 06/06/2016     Non-Invasive Vascular Imaging:   ***  Statin:  {yes no:314532} Beta Blocker:  {yes no:314532} Aspirin :  {yes no:314532} ACEI:  {yes no:314532} ARB:  {yes no:314532} CCB use:  {yes/no:20286} Other antiplatelets/anticoagulants:  {yes no:314532} ***   ASSESSMENT/PLAN: This is a 83 y.o. female ***   -***   Kandice Schmelter R Verna [facility] 02/13/2024 12:13 PM     [1] No Known Allergies  "

## 2024-02-15 LAB — CULTURE, BLOOD (ROUTINE X 2)
Culture: NO GROWTH
Culture: NO GROWTH
Special Requests: ADEQUATE
Special Requests: ADEQUATE

## 2024-02-20 ENCOUNTER — Other Ambulatory Visit: Payer: Self-pay | Admitting: Family

## 2024-02-27 ENCOUNTER — Encounter: Payer: Self-pay | Admitting: Family

## 2024-02-27 ENCOUNTER — Ambulatory Visit: Admitting: Family

## 2024-02-27 VITALS — BP 130/50 | HR 83 | Ht 66.0 in | Wt 199.0 lb

## 2024-02-27 DIAGNOSIS — I70269 Atherosclerosis of native arteries of extremities with gangrene, unspecified extremity: Secondary | ICD-10-CM

## 2024-02-27 DIAGNOSIS — E559 Vitamin D deficiency, unspecified: Secondary | ICD-10-CM

## 2024-02-27 DIAGNOSIS — H401190 Primary open-angle glaucoma, unspecified eye, stage unspecified: Secondary | ICD-10-CM

## 2024-02-27 DIAGNOSIS — J189 Pneumonia, unspecified organism: Secondary | ICD-10-CM | POA: Diagnosis not present

## 2024-02-27 DIAGNOSIS — H348192 Central retinal vein occlusion, unspecified eye, stable: Secondary | ICD-10-CM | POA: Diagnosis not present

## 2024-02-27 DIAGNOSIS — N1831 Chronic kidney disease, stage 3a: Secondary | ICD-10-CM

## 2024-02-27 DIAGNOSIS — E782 Mixed hyperlipidemia: Secondary | ICD-10-CM | POA: Diagnosis not present

## 2024-02-27 DIAGNOSIS — E1165 Type 2 diabetes mellitus with hyperglycemia: Secondary | ICD-10-CM

## 2024-02-27 DIAGNOSIS — R32 Unspecified urinary incontinence: Secondary | ICD-10-CM

## 2024-02-27 DIAGNOSIS — Z6832 Body mass index (BMI) 32.0-32.9, adult: Secondary | ICD-10-CM

## 2024-02-27 DIAGNOSIS — I10 Essential (primary) hypertension: Secondary | ICD-10-CM | POA: Diagnosis not present

## 2024-02-27 DIAGNOSIS — N393 Stress incontinence (female) (male): Secondary | ICD-10-CM

## 2024-02-27 DIAGNOSIS — E6609 Other obesity due to excess calories: Secondary | ICD-10-CM

## 2024-02-27 DIAGNOSIS — E66811 Obesity, class 1: Secondary | ICD-10-CM

## 2024-02-27 DIAGNOSIS — I480 Paroxysmal atrial fibrillation: Secondary | ICD-10-CM | POA: Diagnosis not present

## 2024-02-27 DIAGNOSIS — R5383 Other fatigue: Secondary | ICD-10-CM

## 2024-02-27 DIAGNOSIS — E113513 Type 2 diabetes mellitus with proliferative diabetic retinopathy with macular edema, bilateral: Secondary | ICD-10-CM

## 2024-02-27 DIAGNOSIS — E538 Deficiency of other specified B group vitamins: Secondary | ICD-10-CM

## 2024-02-27 DIAGNOSIS — E1169 Type 2 diabetes mellitus with other specified complication: Secondary | ICD-10-CM

## 2024-02-27 MED ORDER — DEXCOM G7 SENSOR MISC: 2 refills | Status: AC

## 2024-02-27 NOTE — Patient Instructions (Addendum)
 Call [facility] in [place], [place] TODAY and make appoinment [phone] Referral was sent 01/22/24

## 2024-02-27 NOTE — Progress Notes (Unsigned)
 "  Established Patient Office Visit  Subjective:  Patient ID: Wanda Gardner, female    DOB: November 12, 1940  Age: 84 y.o. MRN: [id number]  Chief Complaint  Patient presents with   Follow-up    3 month follow up    Patient is here today for her 3 months follow up.  She has been feeling poorly since last appointment.   She does have additional concerns to discuss today. Patient was recently hospitalized 02/10/24-02/13/24 for pneumonia. She has elevated BP today. Her BP medications were paused while at the hospital and patient was advised to not restart them at time of discharge. She reports that after discharge she did not hold her BP medications and she has been taking them daily.   She reports she has not heard from podiatry. Will provide patient with contact information to get appointment scheduled. 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 She has FU with vascular 03/2024.  She reports blood sugars are good at home. She reports having hypoglycemia frequently in the morning. She reports using 3 units fast insulin  with meals and 10 units long acting once at bedtime. She reports her fist meal isn't until 11am-12 pm. She states she takes taking long acting insulin  around 12am-1am. She reports using her CGM as prescribed but states most of them only last about 7 days before they stop registering and she does not get continued 10 days use out of most of them.  Labs are due today.  She needs refills.   I have reviewed her active problem list, medication list, allergies, family history, social history, health maintenance, notes from last encounter, lab results for her appointment today.    Patient reports having diabetic eye exam before christmas.    No other concerns at this time.   Past Medical History:  Diagnosis Date   Acid reflux    AKI (acute kidney injury) 06/10/2022   Arthritis    Asthma    Chest pain, unspecified 10/30/2013    Formatting of this note might be different from the original. Note: Unchanged Note: Unchanged Formatting of this note might be different from the original. Note: Unchanged Note: Unchanged   Constipation 04/20/2011   Formatting of this note might be different from the original. Note: Unchanged - probably CIC. Note: Unchanged - probably CIC.   COPD (chronic obstructive pulmonary disease) (HCC)    Cough 10/30/2013   Formatting of this note might be different from the original. Note: Unchanged Note: Unchanged   Depression    Diabetes mellitus (HCC) 01/10/2022   Diabetes mellitus without complication (HCC)    Dyspnea    Glaucoma    Headache    right side of head   Headache 07/25/2011   Hyperlipidemia    Hypertension    Knee stiff 05/12/2022   Lens replaced 03/22/2010   Neuropathic pain of hands and feet    PONV (postoperative nausea and vomiting) 1982   back with my hysterectomy.   Pseudophakia 03/22/2010   Sleep apnea    Doesn't use CPAP   Stiffness of shoulder joint 05/12/2022   Trigger finger of right hand 01/10/2022   Wears dentures    full upper and lower    Past Surgical History:  Procedure Laterality Date   ABDOMINAL HYSTERECTOMY     CATARACT EXTRACTION W/ INTRAOCULAR LENS  IMPLANT, BILATERAL     CATARACT EXTRACTION W/PHACO Left 12/14/2021   Procedure: CATARACT EXTRACTION PHACO AND INTRAOCULAR LENS PLACEMENT (  IOC) LEFT DIABETIC 24.79 02:23.9;  Surgeon: Mittie Gaskin, MD;  Location: [facility];  Service: Ophthalmology;  Laterality: Left;  Diabetic   CHOLECYSTECTOMY     COLONOSCOPY WITH PROPOFOL  N/A 10/29/2014   Procedure: COLONOSCOPY WITH PROPOFOL ;  Surgeon: Deward CINDERELLA Piedmont, MD;  Location: [facility];  Service: Gastroenterology;  Laterality: N/A;   ESOPHAGOGASTRODUODENOSCOPY (EGD) WITH PROPOFOL  N/A 10/29/2014   Procedure: ESOPHAGOGASTRODUODENOSCOPY (EGD) WITH PROPOFOL ;  Surgeon: Deward CINDERELLA Piedmont, MD;  Location: [facility];  Service: Gastroenterology;  Laterality: N/A;    EYE SURGERY Bilateral 2011   lid lift done 3 times   FOOT SURGERY     HAND SURGERY     LOWER EXTREMITY ANGIOGRAPHY Left 10/19/2023   Procedure: Lower Extremity Angiography;  Surgeon: Jama Cordella MATSU, MD;  Location: [facility];  Service: Cardiovascular;  Laterality: Left;   LOWER EXTREMITY ANGIOGRAPHY Left 12/25/2023   Procedure: Lower Extremity Angiography;  Surgeon: Jama Cordella MATSU, MD;  Location: [facility];  Service: Cardiovascular;  Laterality: Left;   LOWER EXTREMITY INTERVENTION Left 10/19/2023   Procedure: LOWER EXTREMITY INTERVENTION;  Surgeon: Jama Cordella MATSU, MD;  Location: [facility];  Service: Cardiovascular;  Laterality: Left;   TONGUE SURGERY     removal of cancer   TONSILLECTOMY     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/13/2016   Procedure: TOTAL HIP ARTHROPLASTY ANTERIOR APPROACH;  Surgeon: Ozell Flake, MD;  Location: [facility] OR[facility];  Service: Orthopedics;  Laterality: Right;   TOTAL KNEE ARTHROPLASTY Right 11/23/2015   Procedure: TOTAL KNEE ARTHROPLASTY;  Surgeon: Ozell Flake, MD;  Location: [facility];  Service: Orthopedics;  Laterality: Right;   VOCAL CORD LATERALIZATION, ENDOSCOPIC APPROACH W/ MLB      Social History   Socioeconomic History   Marital status: Divorced    Spouse name: Not on file   Number of children: Not on file   Years of education: Not on file   Highest education level: Not on file  Occupational History   Not on file  Tobacco Use   Smoking status: Every Day    Current packs/day: 0.50    Average packs/day: 0.5 packs/day for 50.0 years (25.0 ttl pk-yrs)    Types: Cigarettes   Smokeless tobacco: Never  Vaping Use   Vaping status: Never Used  Substance and Sexual Activity   Alcohol use: Yes    Alcohol/week: 0.0 standard drinks of alcohol    Comment: 1 glass of wine twice a year   Drug use: Yes    Types: Marijuana    Comment: weed- daily use for pain relief when she can get  it   Sexual activity: Not Currently   Other Topics Concern   Not on file  Social History Narrative   Not on file   Social Drivers of Health   Tobacco Use: High Risk (02/27/2024)   Patient History    Smoking Tobacco Use: Every Day    Smokeless Tobacco Use: Never    Passive Exposure: Not on file  Financial Resource Strain: High Risk (10/29/2023)   Overall Financial Resource Strain (CARDIA)    Difficulty of Paying Living Expenses: Hard  Food Insecurity: Food Insecurity Present (02/10/2024)   Epic    Worried About Programme Researcher, Broadcasting/film/video in the Last Year: Sometimes true    Ran Out of Food in the Last Year: Never true  Transportation Needs: No Transportation Needs (02/10/2024)   Epic    Lack of Transportation (Medical): No    Lack of Transportation (Non-Medical):  No  Physical Activity: Not on file  Stress: Not on file  Social Connections: Moderately Isolated (02/10/2024)   Social Connection and Isolation Panel    Frequency of Communication with Friends and Family: More than three times a week    Frequency of Social Gatherings with Friends and Family: More than three times a week    Attends Religious Services: 1 to 4 times per year    Active Member of Clubs or Organizations: Patient unable to answer    Attends Banker Meetings: Never    Marital Status: Widowed  Intimate Partner Violence: Not At Risk (02/10/2024)   Epic    Fear of Current or Ex-Partner: No    Emotionally Abused: No    Physically Abused: No    Sexually Abused: No  Depression (PHQ2-9): High Risk (05/07/2023)   Depression (PHQ2-9)    PHQ-2 Score: 19  Alcohol Screen: Not on file  Housing: High Risk (02/10/2024)   Epic    Unable to Pay for Housing in the Last Year: Yes    Number of Times Moved in the Last Year: 0    Homeless in the Last Year: No  Utilities: At Risk (02/10/2024)   Epic    Threatened with loss of utilities: Yes  Health Literacy: Not on file    Family History  Problem Relation Age of Onset   Cancer Father    Heart disease  Mother     Allergies[1]  Review of Systems  Constitutional:  Negative for malaise/fatigue.  HENT: Negative.    Eyes:  Negative for blurred vision and pain.  Respiratory:  Negative for cough and shortness of breath.   Cardiovascular:  Negative for chest pain, palpitations, claudication and leg swelling.  Gastrointestinal:  Negative for abdominal pain, blood in stool, constipation, diarrhea, nausea and vomiting.  Genitourinary:  Negative for dysuria, frequency and urgency.  Musculoskeletal: Negative.   Skin: Negative.   Neurological:  Negative for dizziness, tingling, sensory change and headaches.  Endo/Heme/Allergies: Negative.   Psychiatric/Behavioral: Negative.         Objective:   BP (!) 130/50   Pulse 83   Ht 5' 6 (1.676 m)   Wt 199 lb (90.3 kg)   SpO2 90%   BMI 32.12 kg/m   Vitals:   02/27/24 1110  BP: (!) 130/50  Pulse: 83  Height: 5' 6 (1.676 m)  Weight: 199 lb (90.3 kg)  SpO2: 90%  BMI (Calculated): 32.13    Physical Exam Vitals and nursing note reviewed.  Constitutional:      Appearance: Normal appearance.  HENT:     Head: Normocephalic.  Eyes:     Extraocular Movements: Extraocular movements intact.     Pupils: Pupils are equal, round, and reactive to light.  Cardiovascular:     Rate and Rhythm: Normal rate and regular rhythm.     Pulses: Normal pulses.     Heart sounds: Normal heart sounds. No murmur heard. Pulmonary:     Effort: Pulmonary effort is normal. No respiratory distress.     Breath sounds: Normal breath sounds.  Abdominal:     General: There is no distension.     Tenderness: There is no abdominal tenderness.  Musculoskeletal:        General: No tenderness. Normal range of motion.     Cervical back: Normal range of motion and neck supple.     Right lower leg: No edema.     Left lower leg: No edema.  Skin:  General: Skin is warm and dry.     Coloration: Skin is not jaundiced.     Findings: No erythema.  Neurological:      General: No focal deficit present.     Mental Status: She is alert and oriented to person, place, and time.  Psychiatric:        Mood and Affect: Mood normal.        Speech: Speech normal.        Behavior: Behavior is cooperative.        Cognition and Memory: Memory is not impaired.      Results for orders placed or performed in visit on 02/27/24  Lipid panel  Result Value Ref Range   Cholesterol, Total 127 100 - 199 mg/dL   Triglycerides 837 (H) 0 - 149 mg/dL   HDL 45 >60 mg/dL   VLDL Cholesterol Cal 27 5 - 40 mg/dL   LDL Chol Calc (NIH) 55 0 - 99 mg/dL   Chol/HDL Ratio 2.8 0.0 - 4.4 ratio  VITAMIN D  25 Hydroxy (Vit-D Deficiency, Fractures)  Result Value Ref Range   Vit D, 25-Hydroxy 33.9 30.0 - 100.0 ng/mL  CMP14+EGFR  Result Value Ref Range   Glucose 295 (H) 70 - 99 mg/dL   BUN 52 (H) 8 - 27 mg/dL   Creatinine, Ser 8.52 (H) 0.57 - 1.00 mg/dL   eGFR 35 (L) >40 fO/fpw/8.26   BUN/Creatinine Ratio 35 (H) 12 - 28   Sodium 137 134 - 144 mmol/L   Potassium 3.9 3.5 - 5.2 mmol/L   Chloride 96 96 - 106 mmol/L   CO2 26 20 - 29 mmol/L   Calcium 9.7 8.7 - 10.3 mg/dL   Total Protein 6.3 6.0 - 8.5 g/dL   Albumin 3.7 3.7 - 4.7 g/dL   Globulin, Total 2.6 1.5 - 4.5 g/dL   Bilirubin Total <9.7 0.0 - 1.2 mg/dL   Alkaline Phosphatase 116 48 - 129 IU/L   AST 13 0 - 40 IU/L   ALT 10 0 - 32 IU/L  TSH  Result Value Ref Range   TSH 0.549 0.450 - 4.500 uIU/mL  Hemoglobin A1c  Result Value Ref Range   Hgb A1c MFr Bld 6.9 (H) 4.8 - 5.6 %   Est. average glucose Bld gHb Est-mCnc 151 mg/dL  Vitamin B12  Result Value Ref Range   Vitamin B-12 1,334 (H) 232 - 1,245 pg/mL    Recent Results (from the past 2160 hours)  BUN     Status: None   Collection Time: 12/25/23  1:16 PM  Result Value Ref Range   BUN 17 8 - 23 mg/dL    Comment: Performed at [facility], [address] [place], [place] [address]  Creatinine, serum     Status: Abnormal   Collection Time: 12/25/23  1:16 PM   Result Value Ref Range   Creatinine, Ser 1.04 (H) 0.44 - 1.00 mg/dL   GFR, Estimated 54 (L) >60 mL/min    Comment: (NOTE) Calculated using the CKD-EPI Creatinine Equation (2021) Performed at [facility], [address] [place], [place] [address]   Glucose, capillary     Status: Abnormal   Collection Time: 12/25/23  4:46 PM  Result Value Ref Range   Glucose-Capillary 109 (H) 70 - 99 mg/dL    Comment: Glucose reference range applies only to samples taken after fasting for at least 8 hours.  Glucose, capillary     Status: Abnormal   Collection Time: 12/25/23  8:39  PM  Result Value Ref Range   Glucose-Capillary 296 (H) 70 - 99 mg/dL    Comment: Glucose reference range applies only to samples taken after fasting for at least 8 hours.  Glucose, capillary     Status: Abnormal   Collection Time: 12/26/23  7:51 AM  Result Value Ref Range   Glucose-Capillary 115 (H) 70 - 99 mg/dL    Comment: Glucose reference range applies only to samples taken after fasting for at least 8 hours.   Comment 1 Notify RN    Comment 2 Document in Chart   VAS US  ABI WITH/WO TBI     Status: None   Collection Time: 01/15/24  8:32 AM  Result Value Ref Range   Right ABI .96    Left ABI 1.13   Lactic acid, plasma     Status: Abnormal   Collection Time: 02/10/24  9:15 AM  Result Value Ref Range   Lactic Acid, Venous 2.4 (HH) 0.5 - 1.9 mmol/L    Comment: Critical Value, Read Back and verified with SAMANTHA HAMILTON @0945  02/10/24 MJU Performed at [facility], [address] [place], [place] [address]   CBG monitoring, ED     Status: Abnormal   Collection Time: 02/10/24 10:57 AM  Result Value Ref Range   Glucose-Capillary 162 (H) 70 - 99 mg/dL    Comment: Glucose reference range applies only to samples taken after fasting for at least 8 hours.  Comprehensive metabolic panel     Status: Abnormal   Collection Time: 02/10/24 11:49 AM  Result Value Ref Range   Sodium 138 135 - 145 mmol/L    Potassium 4.1 3.5 - 5.1 mmol/L    Comment: HEMOLYSIS AT THIS LEVEL MAY AFFECT RESULT   Chloride 99 98 - 111 mmol/L   CO2 30 22 - 32 mmol/L   Glucose, Bld 168 (H) 70 - 99 mg/dL    Comment: Glucose reference range applies only to samples taken after fasting for at least 8 hours.   BUN 19 8 - 23 mg/dL   Creatinine, Ser 8.98 (H) 0.44 - 1.00 mg/dL   Calcium 9.4 8.9 - 89.6 mg/dL   Total Protein 6.9 6.5 - 8.1 g/dL   Albumin 3.9 3.5 - 5.0 g/dL   AST 31 15 - 41 U/L    Comment: HEMOLYSIS AT THIS LEVEL MAY AFFECT RESULT   ALT 13 0 - 44 U/L   Alkaline Phosphatase 113 38 - 126 U/L   Total Bilirubin 0.2 0.0 - 1.2 mg/dL   GFR, Estimated 55 (L) >60 mL/min    Comment: (NOTE) Calculated using the CKD-EPI Creatinine Equation (2021)    Anion gap 9 5 - 15    Comment: Performed at [facility], [address] [place], [place] [address]  CBC     Status: Abnormal   Collection Time: 02/10/24 11:49 AM  Result Value Ref Range   WBC 7.2 4.0 - 10.5 K/uL   RBC 3.53 (L) 3.87 - 5.11 MIL/uL   Hemoglobin 9.6 (L) 12.0 - 15.0 g/dL   HCT 69.1 (L) 63.9 - 53.9 %   MCV 87.3 80.0 - 100.0 fL   MCH 27.2 26.0 - 34.0 pg   MCHC 31.2 30.0 - 36.0 g/dL   RDW 85.7 88.4 - 84.4 %   Platelets 344 150 - 400 K/uL   nRBC 0.0 0.0 - 0.2 %    Comment: Performed at [facility], [address] [place], [place] [address]  Resp panel by  RT-PCR (RSV, Flu A&B, Covid) Anterior Nasal Swab     Status: None   Collection Time: 02/10/24 11:49 AM   Specimen: Anterior Nasal Swab  Result Value Ref Range   SARS Coronavirus 2 by RT PCR NEGATIVE NEGATIVE    Comment: (NOTE) SARS-CoV-2 target nucleic acids are NOT DETECTED.  The SARS-CoV-2 RNA is generally detectable in upper respiratory specimens during the acute phase of infection. The lowest concentration of SARS-CoV-2 viral copies this assay can detect is 138 copies/mL. A negative result does not preclude SARS-Cov-2 infection and should not be used as the sole basis  for treatment or other patient management decisions. A negative result may occur with  improper specimen collection/handling, submission of specimen other than nasopharyngeal swab, presence of viral mutation(s) within the areas targeted by this assay, and inadequate number of viral copies(<138 copies/mL). A negative result must be combined with clinical observations, patient history, and epidemiological information. The expected result is Negative.  Fact Sheet for Patients:  bloggercourse.com  Fact Sheet for Healthcare Providers:  seriousbroker.it  This test is no t yet approved or cleared by the [place]  FDA and  has been authorized for detection and/or diagnosis of SARS-CoV-2 by FDA under an Emergency Use Authorization (EUA). This EUA will remain  in effect (meaning this test can be used) for the duration of the COVID-19 declaration under Section 564(b)(1) of the Act, 21 U.S.C.section 360bbb-3(b)(1), unless the authorization is terminated  or revoked sooner.       Influenza A by PCR NEGATIVE NEGATIVE   Influenza B by PCR NEGATIVE NEGATIVE    Comment: (NOTE) The Xpert Xpress SARS-CoV-2/FLU/RSV plus assay is intended as an aid in the diagnosis of influenza from Nasopharyngeal swab specimens and should not be used as a sole basis for treatment. Nasal washings and aspirates are unacceptable for Xpert Xpress SARS-CoV-2/FLU/RSV testing.  Fact Sheet for Patients: bloggercourse.com  Fact Sheet for Healthcare Providers: seriousbroker.it  This test is not yet approved or cleared by the [place]  FDA and has been authorized for detection and/or diagnosis of SARS-CoV-2 by FDA under an Emergency Use Authorization (EUA). This EUA will remain in effect (meaning this test can be used) for the duration of the COVID-19 declaration under Section 564(b)(1) of the Act, 21  U.S.C. section 360bbb-3(b)(1), unless the authorization is terminated or revoked.     Resp Syncytial Virus by PCR NEGATIVE NEGATIVE    Comment: (NOTE) Fact Sheet for Patients: bloggercourse.com  Fact Sheet for Healthcare Providers: seriousbroker.it  This test is not yet approved or cleared by the [place]  FDA and has been authorized for detection and/or diagnosis of SARS-CoV-2 by FDA under an Emergency Use Authorization (EUA). This EUA will remain in effect (meaning this test can be used) for the duration of the COVID-19 declaration under Section 564(b)(1) of the Act, 21 U.S.C. section 360bbb-3(b)(1), unless the authorization is terminated or revoked.  Performed at [facility], [address] [place], [place] [address]   Protime-INR     Status: None   Collection Time: 02/10/24 11:49 AM  Result Value Ref Range   Prothrombin Time 14.7 11.4 - 15.2 seconds   INR 1.1 0.8 - 1.2    Comment: (NOTE) INR goal varies based on device and disease states. Performed at [facility], [address] [place], [place] [address]   Magnesium      Status: None   Collection Time: 02/10/24 11:49 AM  Result Value Ref Range   Magnesium  2.0 1.7 - 2.4  mg/dL    Comment: Performed at [facility], [address] [place], [place] [address]  Blood Culture (routine x 2)     Status: None   Collection Time: 02/10/24 11:51 AM   Specimen: BLOOD  Result Value Ref Range   Specimen Description BLOOD    Special Requests      BOTTLES DRAWN AEROBIC AND ANAEROBIC Blood Culture adequate volume   Culture      NO GROWTH 5 DAYS Performed at [facility], [address], [place], [place] [address]    Report Status 02/15/2024 FINAL   Blood Culture (routine x 2)     Status: None   Collection Time: 02/10/24 11:51 AM   Specimen: BLOOD  Result Value Ref Range   Specimen Description BLOOD    Special Requests Blood  Culture adequate volume    Culture      NO GROWTH 5 DAYS Performed at [facility], [address], [place], [place] [address]    Report Status 02/15/2024 FINAL   Urinalysis, Routine w reflex microscopic -Urine, Clean Catch     Status: Abnormal   Collection Time: 02/10/24  1:15 PM  Result Value Ref Range   Color, Urine YELLOW (A) YELLOW   APPearance HAZY (A) CLEAR   Specific Gravity, Urine 1.009 1.005 - 1.030   pH 5.0 5.0 - 8.0   Glucose, UA NEGATIVE NEGATIVE mg/dL   Hgb urine dipstick SMALL (A) NEGATIVE   Bilirubin Urine NEGATIVE NEGATIVE   Ketones, ur NEGATIVE NEGATIVE mg/dL   Protein, ur NEGATIVE NEGATIVE mg/dL   Nitrite NEGATIVE NEGATIVE   Leukocytes,Ua LARGE (A) NEGATIVE   RBC / HPF 0-5 0 - 5 RBC/hpf   WBC, UA >50 0 - 5 WBC/hpf   Bacteria, UA NONE SEEN NONE SEEN   Squamous Epithelial / HPF 0-5 0 - 5 /HPF   WBC Clumps PRESENT    Mucus PRESENT    Hyaline Casts, UA PRESENT     Comment: Performed at [facility], [address] [place], [place] [address]  Urine Culture (for pregnant, neutropenic or urologic patients or patients with an indwelling urinary catheter)     Status: None   Collection Time: 02/10/24  1:15 PM   Specimen: Urine, Random  Result Value Ref Range   Specimen Description      URINE, RANDOM Performed at [facility], [address] [place], [place] [address]    Special Requests      NONE Performed at [facility], [address] [place], [place] [address]    Culture      NO GROWTH Performed at [facility], [address] [place], [place] [address]    Report Status 02/12/2024 FINAL   Procalcitonin     Status: None   Collection Time: 02/10/24  7:50 PM  Result Value Ref Range   Procalcitonin <0.10 ng/mL    Comment: (NOTE)   Sepsis PCT Algorithm          Lower Respiratory Tract Infection                                         PCT  Algorithm -----------------------------------------------------------------  <0.5 ng/mL                    <0.10 ng/mL  Associated with low  Antibiotic therapy strongly   risk for progression          discouraged. Indicates absence   to severe sepsis              of bacteria infection  and/or septic shock             --------------------------------------------------------------  0.5-2.0 ng/mL                 0.10-0.25 ng/mL  Recommended to retest         Antibiotic therapy discouraged.  PCT within 6-24 hours         Bacterial infection unlikely  ------------------------------------------------------------  >2 ng/mL                      0.26-0.50 ng/mL  Associated with high risk     Antibiotic therapy encouraged.  for progression to severe     Bacterial infection possible  sepsis/and or septic shock    ------------------------------                                 >0.50 ng/mL                                Antibiotic therapy strongly                                 encouraged.                                Suggestive of presence of                                 bacterial infection.                                 -------------------------------------------------------------------  < or = 0.50 ng/mL OR          < or = 0.25 OR 80% decrease in PCT  80% decrease in PCT           Antibiotic therapy   Antibiotic therapy may        may be discontinued  be discontinued                                 Performed at [facility], [address] [place], [place] [address]   Glucose, capillary     Status: Abnormal   Collection Time: 02/10/24 10:33 PM  Result Value Ref Range   Glucose-Capillary 242 (H) 70 - 99 mg/dL    Comment: Glucose reference range applies only to samples taken after fasting for at least 8 hours.  Glucose, capillary     Status: Abnormal   Collection Time: 02/11/24  7:53 AM  Result Value Ref Range   Glucose-Capillary 124 (H) 70 - 99 mg/dL     Comment: Glucose reference range applies only to samples taken after fasting for at least 8 hours.  Basic metabolic panel     Status: Abnormal   Collection Time: 02/11/24  8:29 AM  Result Value  Ref Range   Sodium 142 135 - 145 mmol/L   Potassium 3.7 3.5 - 5.1 mmol/L   Chloride 103 98 - 111 mmol/L   CO2 32 22 - 32 mmol/L   Glucose, Bld 112 (H) 70 - 99 mg/dL    Comment: Glucose reference range applies only to samples taken after fasting for at least 8 hours.   BUN 12 8 - 23 mg/dL   Creatinine, Ser 9.14 0.44 - 1.00 mg/dL   Calcium 8.7 (L) 8.9 - 10.3 mg/dL   GFR, Estimated >39 >39 mL/min    Comment: (NOTE) Calculated using the CKD-EPI Creatinine Equation (2021)    Anion gap 7 5 - 15    Comment: Performed at [facility], [address] [place], [place] [address]  CBC     Status: Abnormal   Collection Time: 02/11/24  8:29 AM  Result Value Ref Range   WBC 6.0 4.0 - 10.5 K/uL   RBC 3.21 (L) 3.87 - 5.11 MIL/uL   Hemoglobin 8.8 (L) 12.0 - 15.0 g/dL   HCT 71.6 (L) 63.9 - 53.9 %   MCV 88.2 80.0 - 100.0 fL   MCH 27.4 26.0 - 34.0 pg   MCHC 31.1 30.0 - 36.0 g/dL   RDW 85.9 88.4 - 84.4 %   Platelets 337 150 - 400 K/uL   nRBC 0.0 0.0 - 0.2 %    Comment: Performed at [facility], [address] [place], [place] [address]  Glucose, capillary     Status: Abnormal   Collection Time: 02/11/24 12:21 PM  Result Value Ref Range   Glucose-Capillary 132 (H) 70 - 99 mg/dL    Comment: Glucose reference range applies only to samples taken after fasting for at least 8 hours.  Glucose, capillary     Status: Abnormal   Collection Time: 02/11/24  3:44 PM  Result Value Ref Range   Glucose-Capillary 248 (H) 70 - 99 mg/dL    Comment: Glucose reference range applies only to samples taken after fasting for at least 8 hours.  Glucose, capillary     Status: Abnormal   Collection Time: 02/11/24  9:45 PM  Result Value Ref Range   Glucose-Capillary 191 (H) 70 - 99 mg/dL    Comment: Glucose  reference range applies only to samples taken after fasting for at least 8 hours.  Glucose, capillary     Status: Abnormal   Collection Time: 02/12/24  8:10 AM  Result Value Ref Range   Glucose-Capillary 164 (H) 70 - 99 mg/dL    Comment: Glucose reference range applies only to samples taken after fasting for at least 8 hours.  Glucose, capillary     Status: Abnormal   Collection Time: 02/12/24 12:00 PM  Result Value Ref Range   Glucose-Capillary 195 (H) 70 - 99 mg/dL    Comment: Glucose reference range applies only to samples taken after fasting for at least 8 hours.  Glucose, capillary     Status: Abnormal   Collection Time: 02/12/24  5:25 PM  Result Value Ref Range   Glucose-Capillary 178 (H) 70 - 99 mg/dL    Comment: Glucose reference range applies only to samples taken after fasting for at least 8 hours.  Glucose, capillary     Status: Abnormal   Collection Time: 02/12/24  8:45 PM  Result Value Ref Range   Glucose-Capillary 165 (H) 70 - 99 mg/dL    Comment: Glucose reference range applies only to samples taken after fasting for at least  8 hours.  Glucose, capillary     Status: Abnormal   Collection Time: 02/13/24  7:52 AM  Result Value Ref Range   Glucose-Capillary 131 (H) 70 - 99 mg/dL    Comment: Glucose reference range applies only to samples taken after fasting for at least 8 hours.  Basic metabolic panel     Status: Abnormal   Collection Time: 02/13/24  8:48 AM  Result Value Ref Range   Sodium 142 135 - 145 mmol/L   Potassium 3.8 3.5 - 5.1 mmol/L   Chloride 107 98 - 111 mmol/L   CO2 28 22 - 32 mmol/L   Glucose, Bld 127 (H) 70 - 99 mg/dL    Comment: Glucose reference range applies only to samples taken after fasting for at least 8 hours.   BUN 13 8 - 23 mg/dL   Creatinine, Ser 9.13 0.44 - 1.00 mg/dL   Calcium 8.7 (L) 8.9 - 10.3 mg/dL   GFR, Estimated >39 >39 mL/min    Comment: (NOTE) Calculated using the CKD-EPI Creatinine Equation (2021)    Anion gap 7 5 - 15     Comment: Performed at [facility], [address] [place], [place] [address]  CBC     Status: Abnormal   Collection Time: 02/13/24  8:48 AM  Result Value Ref Range   WBC 5.0 4.0 - 10.5 K/uL   RBC 3.32 (L) 3.87 - 5.11 MIL/uL   Hemoglobin 9.0 (L) 12.0 - 15.0 g/dL   HCT 71.7 (L) 63.9 - 53.9 %   MCV 84.9 80.0 - 100.0 fL   MCH 27.1 26.0 - 34.0 pg   MCHC 31.9 30.0 - 36.0 g/dL   RDW 85.7 88.4 - 84.4 %   Platelets 324 150 - 400 K/uL   nRBC 0.0 0.0 - 0.2 %    Comment: Performed at [facility], [address] [place], [place] [address]  Glucose, capillary     Status: Abnormal   Collection Time: 02/13/24 11:46 AM  Result Value Ref Range   Glucose-Capillary 252 (H) 70 - 99 mg/dL    Comment: Glucose reference range applies only to samples taken after fasting for at least 8 hours.  Lipid panel     Status: Abnormal   Collection Time: 02/27/24 12:24 PM  Result Value Ref Range   Cholesterol, Total 127 100 - 199 mg/dL   Triglycerides 837 (H) 0 - 149 mg/dL   HDL 45 >60 mg/dL   VLDL Cholesterol Cal 27 5 - 40 mg/dL   LDL Chol Calc (NIH) 55 0 - 99 mg/dL   Chol/HDL Ratio 2.8 0.0 - 4.4 ratio    Comment:                                   T. Chol/HDL Ratio                                             Men  Women                               1/2 Avg.Risk  3.4    3.3  Avg.Risk  5.0    4.4                                2X Avg.Risk  9.6    7.1                                3X Avg.Risk 23.4   11.0   VITAMIN D  25 Hydroxy (Vit-D Deficiency, Fractures)     Status: None   Collection Time: 02/27/24 12:24 PM  Result Value Ref Range   Vit D, 25-Hydroxy 33.9 30.0 - 100.0 ng/mL    Comment: Vitamin D  deficiency has been defined by the Institute of Medicine and an Endocrine Society practice guideline as a level of serum 25-OH vitamin D  less than 20 ng/mL (1,2). The Endocrine Society went on to further define vitamin D  insufficiency as a level between 21 and  29 ng/mL (2). 1. IOM (Institute of Medicine). 2010. Dietary reference    intakes for calcium and D. [place]: The    [employer]. 2. Holick MF, Binkley Chaves, Bischoff-Ferrari HA, et al.    Evaluation, treatment, and prevention of vitamin D     deficiency: an Endocrine Society clinical practice    guideline. JCEM. 2011 Jul; 96(7):1911-30.   CMP14+EGFR     Status: Abnormal   Collection Time: 02/27/24 12:24 PM  Result Value Ref Range   Glucose 295 (H) 70 - 99 mg/dL   BUN 52 (H) 8 - 27 mg/dL   Creatinine, Ser 8.52 (H) 0.57 - 1.00 mg/dL   eGFR 35 (L) >40 fO/fpw/8.26   BUN/Creatinine Ratio 35 (H) 12 - 28   Sodium 137 134 - 144 mmol/L   Potassium 3.9 3.5 - 5.2 mmol/L   Chloride 96 96 - 106 mmol/L   CO2 26 20 - 29 mmol/L   Calcium 9.7 8.7 - 10.3 mg/dL   Total Protein 6.3 6.0 - 8.5 g/dL   Albumin 3.7 3.7 - 4.7 g/dL   Globulin, Total 2.6 1.5 - 4.5 g/dL   Bilirubin Total <9.7 0.0 - 1.2 mg/dL   Alkaline Phosphatase 116 48 - 129 IU/L   AST 13 0 - 40 IU/L   ALT 10 0 - 32 IU/L  TSH     Status: None   Collection Time: 02/27/24 12:24 PM  Result Value Ref Range   TSH 0.549 0.450 - 4.500 uIU/mL  Hemoglobin A1c     Status: Abnormal   Collection Time: 02/27/24 12:24 PM  Result Value Ref Range   Hgb A1c MFr Bld 6.9 (H) 4.8 - 5.6 %    Comment:          Prediabetes: 5.7 - 6.4          Diabetes: >6.4          Glycemic control for adults with diabetes: <7.0    Est. 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, with long-term current use of insulin  (HCC) CKD stage 3a, GFR 45-59 ml/min (HCC) Class 1 obesity due to excess calories with serious comorbidity and body mass index (BMI) of 32.0 to 32.9 in adult 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 - Continue medications as prescribed. - Check labs today  - Advised patient to make urgent appointment with podiatrist as previously referred. 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 - Keep area as clean and dry as possible to prevent skin break down and moisture trauma to skin. - Use barrier ointment.    Return in about 3 months (around 05/27/2024).   Total time spent: 25 minutes  Oddis DELENA Cain, FNP  02/27/2024   This document may have been prepared by [facility] Voice Recognition software and as such may include unintentional dictation errors.      [1] No Known Allergies  "

## 2024-02-28 DIAGNOSIS — R5383 Other fatigue: Secondary | ICD-10-CM | POA: Insufficient documentation

## 2024-02-28 DIAGNOSIS — E66811 Obesity, class 1: Secondary | ICD-10-CM | POA: Insufficient documentation

## 2024-02-28 LAB — CMP14+EGFR
ALT: 10 IU/L (ref 0–32)
AST: 13 IU/L (ref 0–40)
Albumin: 3.7 g/dL (ref 3.7–4.7)
Alkaline Phosphatase: 116 IU/L (ref 48–129)
BUN/Creatinine Ratio: 35 — ABNORMAL HIGH (ref 12–28)
BUN: 52 mg/dL — ABNORMAL HIGH (ref 8–27)
Bilirubin Total: <0.2 mg/dL (ref 0.0–1.2)
CO2: 26 mmol/L (ref 20–29)
Calcium: 9.7 mg/dL (ref 8.7–10.3)
Chloride: 96 mmol/L (ref 96–106)
Creatinine, Ser: 1.47 mg/dL — ABNORMAL HIGH (ref 0.57–1.00)
Globulin, Total: 2.6 g/dL (ref 1.5–4.5)
Glucose: 295 mg/dL — ABNORMAL HIGH (ref 70–99)
Potassium: 3.9 mmol/L (ref 3.5–5.2)
Sodium: 137 mmol/L (ref 134–144)
Total Protein: 6.3 g/dL (ref 6.0–8.5)
eGFR: 35 mL/min/1.73 — ABNORMAL LOW

## 2024-02-28 LAB — TSH: TSH: 0.549 u[IU]/mL (ref 0.450–4.500)

## 2024-02-28 LAB — LIPID PANEL
Chol/HDL Ratio: 2.8 ratio (ref 0.0–4.4)
Cholesterol, Total: 127 mg/dL (ref 100–199)
HDL: 45 mg/dL
LDL Chol Calc (NIH): 55 mg/dL (ref 0–99)
Triglycerides: 162 mg/dL — ABNORMAL HIGH (ref 0–149)
VLDL Cholesterol Cal: 27 mg/dL (ref 5–40)

## 2024-02-28 LAB — VITAMIN D 25 HYDROXY (VIT D DEFICIENCY, FRACTURES): Vit D, 25-Hydroxy: 33.9 ng/mL (ref 30.0–100.0)

## 2024-02-28 LAB — HEMOGLOBIN A1C
Est. average glucose Bld gHb Est-mCnc: 151 mg/dL
Hgb A1c MFr Bld: 6.9 % — ABNORMAL HIGH (ref 4.8–5.6)

## 2024-02-28 LAB — VITAMIN B12: Vitamin B-12: 1334 pg/mL — ABNORMAL HIGH (ref 232–1245)

## 2024-02-28 NOTE — Assessment & Plan Note (Addendum)
-   Reinforced need for healthy diet and exercise as tolerated. - Continue medications as prescribed. - Check labs today  - Advised patient to make urgent appointment with podiatrist as previously referred.

## 2024-02-28 NOTE — Assessment & Plan Note (Signed)
-   Reinforced need for healthy diet and exercise as tolerated. - Continue medications as prescribed. - Check labs today  - Advised patient to make urgent appointment with podiatrist as previously referred.

## 2024-02-28 NOTE — Assessment & Plan Note (Addendum)
-   Continue using incontinence supplies as needed. - Keep area as clean and dry as possible to prevent skin break down and moisture trauma to skin. - Use barrier ointment. - Sending new order for incontinence supplies, pt uses large size with 5 changes per day.  She also needs gloves.

## 2024-02-28 NOTE — Assessment & Plan Note (Addendum)
-   Check labs today - Supplementation recommended based off lab results and will notify patient at that time

## 2024-03-03 ENCOUNTER — Other Ambulatory Visit (INDEPENDENT_AMBULATORY_CARE_PROVIDER_SITE_OTHER): Payer: Self-pay | Admitting: Nurse Practitioner

## 2024-03-07 DIAGNOSIS — R32 Unspecified urinary incontinence: Secondary | ICD-10-CM | POA: Insufficient documentation

## 2024-03-07 NOTE — Assessment & Plan Note (Signed)
-   Continue using incontinence supplies as needed. - Keep area as clean and dry as possible to prevent skin break down and moisture trauma to skin. - Use barrier ointment. - Sending new order for incontinence supplies, pt uses large size with 5 changes per day.  She also needs gloves.

## 2024-03-13 ENCOUNTER — Other Ambulatory Visit: Payer: Self-pay

## 2024-03-13 ENCOUNTER — Ambulatory Visit: Payer: Self-pay

## 2024-03-21 ENCOUNTER — Other Ambulatory Visit: Payer: Self-pay | Admitting: Family

## 2024-04-15 ENCOUNTER — Encounter (INDEPENDENT_AMBULATORY_CARE_PROVIDER_SITE_OTHER)

## 2024-04-15 ENCOUNTER — Ambulatory Visit (INDEPENDENT_AMBULATORY_CARE_PROVIDER_SITE_OTHER): Admitting: Nurse Practitioner

## 2024-05-27 ENCOUNTER — Ambulatory Visit: Admitting: Family
